# Patient Record
Sex: Male | Born: 1970 | Race: Black or African American | Hispanic: No | Marital: Single | Smoking: Current every day smoker
Health system: Southern US, Community
[De-identification: ages and names within clinical notes are randomized; demographics above are authoritative.]

## PROBLEM LIST (undated history)

## (undated) DIAGNOSIS — E119 Type 2 diabetes mellitus without complications: Secondary | ICD-10-CM

## (undated) DIAGNOSIS — I499 Cardiac arrhythmia, unspecified: Secondary | ICD-10-CM

## (undated) DIAGNOSIS — K219 Gastro-esophageal reflux disease without esophagitis: Secondary | ICD-10-CM

## (undated) DIAGNOSIS — E669 Obesity, unspecified: Secondary | ICD-10-CM

## (undated) DIAGNOSIS — G4730 Sleep apnea, unspecified: Secondary | ICD-10-CM

## (undated) DIAGNOSIS — I10 Essential (primary) hypertension: Secondary | ICD-10-CM

## (undated) DIAGNOSIS — R0602 Shortness of breath: Secondary | ICD-10-CM

## (undated) DIAGNOSIS — M1990 Unspecified osteoarthritis, unspecified site: Secondary | ICD-10-CM

## (undated) DIAGNOSIS — I2699 Other pulmonary embolism without acute cor pulmonale: Secondary | ICD-10-CM

## (undated) DIAGNOSIS — I4891 Unspecified atrial fibrillation: Secondary | ICD-10-CM

## (undated) HISTORY — DX: Gastro-esophageal reflux disease without esophagitis: K21.9

## (undated) HISTORY — PX: HEEL SPUR SURGERY: SHX665

## (undated) HISTORY — PX: HIP SURGERY: SHX245

## (undated) HISTORY — DX: Other pulmonary embolism without acute cor pulmonale: I26.99

---

## 1999-02-26 ENCOUNTER — Emergency Department (HOSPITAL_COMMUNITY): Admission: EM | Admit: 1999-02-26 | Discharge: 1999-02-26 | Payer: Self-pay | Admitting: Emergency Medicine

## 1999-02-26 ENCOUNTER — Encounter: Payer: Self-pay | Admitting: Emergency Medicine

## 1999-06-02 ENCOUNTER — Emergency Department (HOSPITAL_COMMUNITY): Admission: EM | Admit: 1999-06-02 | Discharge: 1999-06-02 | Payer: Self-pay | Admitting: Emergency Medicine

## 1999-11-28 ENCOUNTER — Emergency Department (HOSPITAL_COMMUNITY): Admission: EM | Admit: 1999-11-28 | Discharge: 1999-11-28 | Payer: Self-pay | Admitting: Emergency Medicine

## 2000-04-07 ENCOUNTER — Emergency Department (HOSPITAL_COMMUNITY): Admission: EM | Admit: 2000-04-07 | Discharge: 2000-04-08 | Payer: Self-pay | Admitting: Emergency Medicine

## 2000-04-08 ENCOUNTER — Encounter: Payer: Self-pay | Admitting: Emergency Medicine

## 2000-05-13 ENCOUNTER — Encounter: Admission: RE | Admit: 2000-05-13 | Discharge: 2000-05-13 | Payer: Self-pay | Admitting: Hematology and Oncology

## 2000-05-19 ENCOUNTER — Encounter: Admission: RE | Admit: 2000-05-19 | Discharge: 2000-05-19 | Payer: Self-pay | Admitting: Internal Medicine

## 2000-05-19 ENCOUNTER — Encounter: Payer: Self-pay | Admitting: Internal Medicine

## 2000-05-19 ENCOUNTER — Ambulatory Visit (HOSPITAL_COMMUNITY): Admission: RE | Admit: 2000-05-19 | Discharge: 2000-05-19 | Payer: Self-pay | Admitting: Internal Medicine

## 2000-05-23 ENCOUNTER — Ambulatory Visit (HOSPITAL_COMMUNITY): Admission: RE | Admit: 2000-05-23 | Discharge: 2000-05-23 | Payer: Self-pay

## 2000-05-26 ENCOUNTER — Encounter: Admission: RE | Admit: 2000-05-26 | Discharge: 2000-05-26 | Payer: Self-pay | Admitting: Internal Medicine

## 2000-05-27 ENCOUNTER — Encounter: Admission: RE | Admit: 2000-05-27 | Discharge: 2000-05-27 | Payer: Self-pay | Admitting: Hematology and Oncology

## 2000-06-26 ENCOUNTER — Ambulatory Visit (HOSPITAL_BASED_OUTPATIENT_CLINIC_OR_DEPARTMENT_OTHER): Admission: RE | Admit: 2000-06-26 | Discharge: 2000-06-26 | Payer: Self-pay | Admitting: *Deleted

## 2000-08-30 ENCOUNTER — Emergency Department (HOSPITAL_COMMUNITY): Admission: EM | Admit: 2000-08-30 | Discharge: 2000-08-30 | Payer: Self-pay | Admitting: Emergency Medicine

## 2000-09-23 ENCOUNTER — Emergency Department (HOSPITAL_COMMUNITY): Admission: EM | Admit: 2000-09-23 | Discharge: 2000-09-23 | Payer: Self-pay

## 2000-11-02 ENCOUNTER — Emergency Department (HOSPITAL_COMMUNITY): Admission: EM | Admit: 2000-11-02 | Discharge: 2000-11-02 | Payer: Self-pay | Admitting: Emergency Medicine

## 2002-01-05 ENCOUNTER — Emergency Department (HOSPITAL_COMMUNITY): Admission: EM | Admit: 2002-01-05 | Discharge: 2002-01-05 | Payer: Self-pay | Admitting: Emergency Medicine

## 2002-01-31 ENCOUNTER — Encounter: Payer: Self-pay | Admitting: Emergency Medicine

## 2002-01-31 ENCOUNTER — Emergency Department (HOSPITAL_COMMUNITY): Admission: EM | Admit: 2002-01-31 | Discharge: 2002-01-31 | Payer: Self-pay | Admitting: Emergency Medicine

## 2004-02-27 ENCOUNTER — Emergency Department (HOSPITAL_COMMUNITY): Admission: EM | Admit: 2004-02-27 | Discharge: 2004-02-27 | Payer: Self-pay | Admitting: Emergency Medicine

## 2004-08-09 ENCOUNTER — Emergency Department (HOSPITAL_COMMUNITY): Admission: EM | Admit: 2004-08-09 | Discharge: 2004-08-09 | Payer: Self-pay | Admitting: Emergency Medicine

## 2004-09-27 ENCOUNTER — Emergency Department (HOSPITAL_COMMUNITY): Admission: EM | Admit: 2004-09-27 | Discharge: 2004-09-27 | Payer: Self-pay | Admitting: Emergency Medicine

## 2005-11-06 ENCOUNTER — Emergency Department (HOSPITAL_COMMUNITY): Admission: EM | Admit: 2005-11-06 | Discharge: 2005-11-06 | Payer: Self-pay | Admitting: Emergency Medicine

## 2006-01-13 ENCOUNTER — Emergency Department (HOSPITAL_COMMUNITY): Admission: EM | Admit: 2006-01-13 | Discharge: 2006-01-13 | Payer: Self-pay | Admitting: Emergency Medicine

## 2006-01-25 ENCOUNTER — Emergency Department (HOSPITAL_COMMUNITY): Admission: EM | Admit: 2006-01-25 | Discharge: 2006-01-25 | Payer: Self-pay | Admitting: Emergency Medicine

## 2006-01-26 ENCOUNTER — Ambulatory Visit: Payer: Self-pay | Admitting: Pulmonary Disease

## 2006-01-26 ENCOUNTER — Inpatient Hospital Stay (HOSPITAL_COMMUNITY): Admission: EM | Admit: 2006-01-26 | Discharge: 2006-02-01 | Payer: Self-pay | Admitting: Emergency Medicine

## 2006-01-30 ENCOUNTER — Encounter: Payer: Self-pay | Admitting: Cardiology

## 2006-02-14 ENCOUNTER — Ambulatory Visit: Payer: Self-pay | Admitting: Internal Medicine

## 2006-02-18 ENCOUNTER — Ambulatory Visit: Payer: Self-pay | Admitting: Pulmonary Disease

## 2006-02-20 ENCOUNTER — Ambulatory Visit: Payer: Self-pay | Admitting: Cardiology

## 2006-02-25 ENCOUNTER — Ambulatory Visit: Payer: Self-pay | Admitting: Internal Medicine

## 2006-02-26 ENCOUNTER — Ambulatory Visit: Payer: Self-pay | Admitting: Cardiovascular Disease

## 2006-02-28 ENCOUNTER — Ambulatory Visit: Payer: Self-pay | Admitting: *Deleted

## 2006-02-28 ENCOUNTER — Emergency Department (HOSPITAL_COMMUNITY): Admission: EM | Admit: 2006-02-28 | Discharge: 2006-03-01 | Payer: Self-pay | Admitting: Emergency Medicine

## 2006-03-02 ENCOUNTER — Inpatient Hospital Stay (HOSPITAL_COMMUNITY): Admission: EM | Admit: 2006-03-02 | Discharge: 2006-03-13 | Payer: Self-pay | Admitting: Emergency Medicine

## 2006-03-04 ENCOUNTER — Encounter (INDEPENDENT_AMBULATORY_CARE_PROVIDER_SITE_OTHER): Payer: Self-pay | Admitting: Cardiovascular Disease

## 2006-03-06 ENCOUNTER — Ambulatory Visit: Payer: Self-pay | Admitting: Infectious Diseases

## 2006-03-14 ENCOUNTER — Ambulatory Visit: Payer: Self-pay | Admitting: Internal Medicine

## 2006-04-01 ENCOUNTER — Encounter: Admission: RE | Admit: 2006-04-01 | Discharge: 2006-04-01 | Payer: Self-pay | Admitting: Internal Medicine

## 2006-04-04 ENCOUNTER — Ambulatory Visit (HOSPITAL_BASED_OUTPATIENT_CLINIC_OR_DEPARTMENT_OTHER): Admission: RE | Admit: 2006-04-04 | Discharge: 2006-04-04 | Payer: Self-pay | Admitting: Pulmonary Disease

## 2006-04-10 ENCOUNTER — Ambulatory Visit: Payer: Self-pay | Admitting: Internal Medicine

## 2006-04-12 ENCOUNTER — Ambulatory Visit: Payer: Self-pay | Admitting: Pulmonary Disease

## 2006-05-20 ENCOUNTER — Ambulatory Visit: Payer: Self-pay | Admitting: Internal Medicine

## 2006-09-18 ENCOUNTER — Ambulatory Visit: Payer: Self-pay | Admitting: Internal Medicine

## 2006-09-18 DIAGNOSIS — G4730 Sleep apnea, unspecified: Secondary | ICD-10-CM | POA: Insufficient documentation

## 2006-09-18 DIAGNOSIS — M8700 Idiopathic aseptic necrosis of unspecified bone: Secondary | ICD-10-CM | POA: Insufficient documentation

## 2006-09-18 DIAGNOSIS — I10 Essential (primary) hypertension: Secondary | ICD-10-CM | POA: Insufficient documentation

## 2006-10-29 ENCOUNTER — Encounter (INDEPENDENT_AMBULATORY_CARE_PROVIDER_SITE_OTHER): Payer: Self-pay | Admitting: *Deleted

## 2006-11-13 ENCOUNTER — Encounter (INDEPENDENT_AMBULATORY_CARE_PROVIDER_SITE_OTHER): Payer: Self-pay | Admitting: Internal Medicine

## 2006-12-03 ENCOUNTER — Telehealth (INDEPENDENT_AMBULATORY_CARE_PROVIDER_SITE_OTHER): Payer: Self-pay | Admitting: *Deleted

## 2006-12-19 ENCOUNTER — Ambulatory Visit: Payer: Self-pay | Admitting: Internal Medicine

## 2006-12-19 ENCOUNTER — Ambulatory Visit (HOSPITAL_COMMUNITY): Admission: RE | Admit: 2006-12-19 | Discharge: 2006-12-19 | Payer: Self-pay | Admitting: Internal Medicine

## 2006-12-19 DIAGNOSIS — M79609 Pain in unspecified limb: Secondary | ICD-10-CM | POA: Insufficient documentation

## 2006-12-19 DIAGNOSIS — M25569 Pain in unspecified knee: Secondary | ICD-10-CM | POA: Insufficient documentation

## 2006-12-19 LAB — CONVERTED CEMR LAB
ALT: 20 units/L (ref 0–53)
AST: 15 units/L (ref 0–37)
Albumin: 4.3 g/dL (ref 3.5–5.2)
Alkaline Phosphatase: 77 units/L (ref 39–117)
BUN: 11 mg/dL (ref 6–23)
Basophils Absolute: 0 10*3/uL (ref 0.0–0.1)
Basophils Relative: 0 % (ref 0–1)
CO2: 23 meq/L (ref 19–32)
Calcium: 9.4 mg/dL (ref 8.4–10.5)
Chloride: 105 meq/L (ref 96–112)
Creatinine, Ser: 1.04 mg/dL (ref 0.40–1.50)
Eosinophils Absolute: 0.1 10*3/uL (ref 0.0–0.7)
Eosinophils Relative: 1 % (ref 0–5)
Glucose, Bld: 97 mg/dL (ref 70–99)
HCT: 44.2 % (ref 39.0–52.0)
Hemoglobin: 15 g/dL (ref 13.0–17.0)
Lymphocytes Relative: 44 % (ref 12–46)
Lymphs Abs: 3.1 10*3/uL (ref 0.7–3.3)
MCHC: 33.9 g/dL (ref 30.0–36.0)
MCV: 89.7 fL (ref 78.0–100.0)
Monocytes Absolute: 0.8 10*3/uL — ABNORMAL HIGH (ref 0.2–0.7)
Monocytes Relative: 11 % (ref 3–11)
Neutro Abs: 3.1 10*3/uL (ref 1.7–7.7)
Neutrophils Relative %: 44 % (ref 43–77)
Platelets: 293 10*3/uL (ref 150–400)
Potassium: 4 meq/L (ref 3.5–5.3)
RBC: 4.93 M/uL (ref 4.22–5.81)
RDW: 12.5 % (ref 11.5–14.0)
Sodium: 139 meq/L (ref 135–145)
Total Bilirubin: 0.3 mg/dL (ref 0.3–1.2)
Total Protein: 7.2 g/dL (ref 6.0–8.3)
WBC: 7.1 10*3/uL (ref 4.0–10.5)

## 2007-01-06 ENCOUNTER — Encounter (INDEPENDENT_AMBULATORY_CARE_PROVIDER_SITE_OTHER): Payer: Self-pay | Admitting: Internal Medicine

## 2007-01-15 ENCOUNTER — Encounter (INDEPENDENT_AMBULATORY_CARE_PROVIDER_SITE_OTHER): Payer: Self-pay | Admitting: Internal Medicine

## 2007-01-26 ENCOUNTER — Telehealth (INDEPENDENT_AMBULATORY_CARE_PROVIDER_SITE_OTHER): Payer: Self-pay | Admitting: Internal Medicine

## 2007-02-11 ENCOUNTER — Emergency Department (HOSPITAL_COMMUNITY): Admission: EM | Admit: 2007-02-11 | Discharge: 2007-02-11 | Payer: Self-pay | Admitting: Emergency Medicine

## 2007-02-26 ENCOUNTER — Ambulatory Visit: Payer: Self-pay | Admitting: Internal Medicine

## 2007-02-26 DIAGNOSIS — R109 Unspecified abdominal pain: Secondary | ICD-10-CM | POA: Insufficient documentation

## 2007-03-19 ENCOUNTER — Emergency Department (HOSPITAL_COMMUNITY): Admission: EM | Admit: 2007-03-19 | Discharge: 2007-03-19 | Payer: Self-pay | Admitting: Emergency Medicine

## 2007-03-23 ENCOUNTER — Emergency Department (HOSPITAL_COMMUNITY): Admission: EM | Admit: 2007-03-23 | Discharge: 2007-03-23 | Payer: Self-pay | Admitting: Emergency Medicine

## 2007-03-27 ENCOUNTER — Encounter (INDEPENDENT_AMBULATORY_CARE_PROVIDER_SITE_OTHER): Payer: Self-pay | Admitting: Internal Medicine

## 2007-04-10 ENCOUNTER — Encounter (INDEPENDENT_AMBULATORY_CARE_PROVIDER_SITE_OTHER): Payer: Self-pay | Admitting: Internal Medicine

## 2007-04-14 ENCOUNTER — Emergency Department (HOSPITAL_COMMUNITY): Admission: EM | Admit: 2007-04-14 | Discharge: 2007-04-14 | Payer: Self-pay | Admitting: Emergency Medicine

## 2007-04-30 ENCOUNTER — Ambulatory Visit: Payer: Self-pay | Admitting: Internal Medicine

## 2007-05-07 ENCOUNTER — Telehealth (INDEPENDENT_AMBULATORY_CARE_PROVIDER_SITE_OTHER): Payer: Self-pay | Admitting: Internal Medicine

## 2007-05-19 ENCOUNTER — Encounter: Admission: RE | Admit: 2007-05-19 | Discharge: 2007-07-15 | Payer: Self-pay | Admitting: Internal Medicine

## 2007-05-19 ENCOUNTER — Encounter (INDEPENDENT_AMBULATORY_CARE_PROVIDER_SITE_OTHER): Payer: Self-pay | Admitting: Internal Medicine

## 2007-05-26 ENCOUNTER — Telehealth (INDEPENDENT_AMBULATORY_CARE_PROVIDER_SITE_OTHER): Payer: Self-pay | Admitting: Internal Medicine

## 2007-05-29 ENCOUNTER — Telehealth (INDEPENDENT_AMBULATORY_CARE_PROVIDER_SITE_OTHER): Payer: Self-pay | Admitting: Internal Medicine

## 2007-06-19 ENCOUNTER — Emergency Department (HOSPITAL_COMMUNITY): Admission: EM | Admit: 2007-06-19 | Discharge: 2007-06-19 | Payer: Self-pay | Admitting: Family Medicine

## 2007-06-23 ENCOUNTER — Encounter (INDEPENDENT_AMBULATORY_CARE_PROVIDER_SITE_OTHER): Payer: Self-pay | Admitting: Internal Medicine

## 2007-06-30 ENCOUNTER — Ambulatory Visit: Payer: Self-pay | Admitting: Internal Medicine

## 2007-07-15 ENCOUNTER — Encounter (INDEPENDENT_AMBULATORY_CARE_PROVIDER_SITE_OTHER): Payer: Self-pay | Admitting: Internal Medicine

## 2007-09-08 ENCOUNTER — Emergency Department (HOSPITAL_COMMUNITY): Admission: EM | Admit: 2007-09-08 | Discharge: 2007-09-09 | Payer: Self-pay | Admitting: Emergency Medicine

## 2007-10-02 ENCOUNTER — Telehealth (INDEPENDENT_AMBULATORY_CARE_PROVIDER_SITE_OTHER): Payer: Self-pay | Admitting: Internal Medicine

## 2007-10-09 ENCOUNTER — Ambulatory Visit: Payer: Self-pay | Admitting: Internal Medicine

## 2007-10-09 DIAGNOSIS — G5620 Lesion of ulnar nerve, unspecified upper limb: Secondary | ICD-10-CM | POA: Insufficient documentation

## 2007-10-09 DIAGNOSIS — K029 Dental caries, unspecified: Secondary | ICD-10-CM | POA: Insufficient documentation

## 2007-10-09 DIAGNOSIS — G5600 Carpal tunnel syndrome, unspecified upper limb: Secondary | ICD-10-CM | POA: Insufficient documentation

## 2007-10-09 DIAGNOSIS — K219 Gastro-esophageal reflux disease without esophagitis: Secondary | ICD-10-CM | POA: Insufficient documentation

## 2007-12-01 ENCOUNTER — Ambulatory Visit: Payer: Self-pay | Admitting: Internal Medicine

## 2007-12-31 ENCOUNTER — Telehealth (INDEPENDENT_AMBULATORY_CARE_PROVIDER_SITE_OTHER): Payer: Self-pay | Admitting: Internal Medicine

## 2008-01-28 ENCOUNTER — Telehealth (INDEPENDENT_AMBULATORY_CARE_PROVIDER_SITE_OTHER): Payer: Self-pay | Admitting: Internal Medicine

## 2008-02-27 ENCOUNTER — Emergency Department (HOSPITAL_COMMUNITY): Admission: EM | Admit: 2008-02-27 | Discharge: 2008-02-27 | Payer: Self-pay | Admitting: Emergency Medicine

## 2008-03-31 ENCOUNTER — Ambulatory Visit: Payer: Self-pay | Admitting: Internal Medicine

## 2008-03-31 DIAGNOSIS — K089 Disorder of teeth and supporting structures, unspecified: Secondary | ICD-10-CM | POA: Insufficient documentation

## 2008-04-07 ENCOUNTER — Encounter (INDEPENDENT_AMBULATORY_CARE_PROVIDER_SITE_OTHER): Payer: Self-pay | Admitting: Internal Medicine

## 2008-04-28 ENCOUNTER — Telehealth (INDEPENDENT_AMBULATORY_CARE_PROVIDER_SITE_OTHER): Payer: Self-pay | Admitting: Internal Medicine

## 2008-07-21 ENCOUNTER — Encounter (INDEPENDENT_AMBULATORY_CARE_PROVIDER_SITE_OTHER): Payer: Self-pay | Admitting: Internal Medicine

## 2008-08-27 ENCOUNTER — Emergency Department (HOSPITAL_COMMUNITY): Admission: EM | Admit: 2008-08-27 | Discharge: 2008-08-27 | Payer: Self-pay | Admitting: Emergency Medicine

## 2008-09-09 ENCOUNTER — Emergency Department (HOSPITAL_COMMUNITY): Admission: EM | Admit: 2008-09-09 | Discharge: 2008-09-09 | Payer: Self-pay | Admitting: Family Medicine

## 2008-11-26 ENCOUNTER — Emergency Department (HOSPITAL_COMMUNITY): Admission: EM | Admit: 2008-11-26 | Discharge: 2008-11-26 | Payer: Self-pay | Admitting: Family Medicine

## 2009-01-30 ENCOUNTER — Telehealth (INDEPENDENT_AMBULATORY_CARE_PROVIDER_SITE_OTHER): Payer: Self-pay | Admitting: Internal Medicine

## 2009-01-31 ENCOUNTER — Encounter (INDEPENDENT_AMBULATORY_CARE_PROVIDER_SITE_OTHER): Payer: Self-pay | Admitting: Internal Medicine

## 2009-04-14 ENCOUNTER — Emergency Department (HOSPITAL_COMMUNITY): Admission: EM | Admit: 2009-04-14 | Discharge: 2009-04-14 | Payer: Self-pay | Admitting: Emergency Medicine

## 2009-07-11 ENCOUNTER — Emergency Department (HOSPITAL_COMMUNITY): Admission: EM | Admit: 2009-07-11 | Discharge: 2009-07-11 | Payer: Self-pay | Admitting: Family Medicine

## 2009-08-27 ENCOUNTER — Emergency Department (HOSPITAL_COMMUNITY): Admission: EM | Admit: 2009-08-27 | Discharge: 2009-08-28 | Payer: Self-pay | Admitting: Emergency Medicine

## 2009-11-20 ENCOUNTER — Encounter
Admission: RE | Admit: 2009-11-20 | Discharge: 2009-11-24 | Payer: Self-pay | Source: Home / Self Care | Attending: Physical Medicine & Rehabilitation | Admitting: Physical Medicine & Rehabilitation

## 2009-11-24 ENCOUNTER — Ambulatory Visit: Payer: Self-pay | Admitting: Physical Medicine & Rehabilitation

## 2010-01-23 ENCOUNTER — Encounter
Admission: RE | Admit: 2010-01-23 | Discharge: 2010-03-13 | Payer: Self-pay | Source: Home / Self Care | Attending: Physical Medicine & Rehabilitation | Admitting: Physical Medicine & Rehabilitation

## 2010-01-23 ENCOUNTER — Ambulatory Visit: Payer: Self-pay | Admitting: Physical Medicine & Rehabilitation

## 2010-03-13 NOTE — Progress Notes (Signed)
   Phone Note From Other Clinic   Caller: [facility] Summary of Call: Letter from above for Bariatric Surgery program--need to know if pt. still with [place] before fill out.  This was a question last week. Initial call taken by: Julieanne Manson MD,  January 30, 2009 11:50 AM  Follow-up for Phone Call         he is going to stay a patient with [place]. He has not been able to secure a primary provider but he wants to move forward with getting form completed to try and get his surgery. Pt needs by Thursday........ Follow-up by: Mikey College CMA,  January 31, 2009 11:14 AM  Additional Follow-up for Phone Call Additional follow up Details #1::        papers filled out Additional Follow-up by: Julieanne Manson MD,  January 31, 2009 2:15 PM

## 2010-03-13 NOTE — Assessment & Plan Note (Signed)
Summary: FU PER Janneth Krasner//AO   Vital Signs:  Patient Profile:   40 Years Old Male Weight:      338 pounds Temp:     98.7 degrees F oral Pulse rate:   80 / minute Pulse rhythm:   regular Resp:     18 per minute BP sitting:   122 / 72  (left arm) Cuff size:   large  Pt. in pain?   no  Vitals Entered By: [place] shannon, student, MA                  Chief Complaint:  pt wants to talk about seeing a denist....  History of Present Illness: 1.  Has teeth that need pulling--several cavities.  2.  Stiff all over, but groin especially--since cold and rainy.  Going to bed 3-4 pm in this cold rainy weather.  Is trying to go to pool for exercises, but doesn't like to come back out into cold air and so, often doesn't go.  3.  GERD:  No problems since starting Protonix.  HOB elevated.  Still smoking--states less, however.   4.  CTS:  Did not get cock up splints--not using computer as much and symptoms are not as frequent.   5.  Insomnia:  Was only able to get 12 of Rozerem filled last time at pharmacy.  Has not filled out ICP papers yet.  Having difficult time getting meds at [facility].    Prior Medications Reviewed Using: Medication Bottles  Current Allergies (reviewed today): No known allergies       Physical Exam  General:     Obese, NAD Mouth:     poor dentition.   Lungs:     Normal respiratory effort, chest expands symmetrically. Lungs are clear to auscultation, no crackles or wheezes. Heart:     Normal rate and regular rhythm. S1 and S2 normal without gallop, murmur, click, rub or other extra sounds.  Radial pulses normal and equal    Impression & Recommendations:  Problem # 1:  DENTAL CARIES (ICD-521.00) Slip for free dental clinic in November given.  Problem # 2:  CARPAL TUNNEL SYNDROME, BILATERAL (ICD-354.0) No longer a problem  Problem # 3:  GERD (ICD-530.81) controlled now. His updated medication list for this problem includes:    Protonix 40  Mg Pack (Pantoprazole sodium) .Marland Kitchen... 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 Start Naproxen 500 mg two times a day with food. Use Lortab on as needed basis  Problem # 5:  INSOMNIA (ICD-780.52) Discontinur Rozerem and start Amitriptyline 25 mg q hs--may also help with chronic pain The following medications were removed from the medication list:    Rozerem 8 Mg Tabs (Ramelteon) .Marland Kitchen... 1 tab by mouth daily   Problem # 6:  HYPERTENSION (ICD-401.9) Good control when compliant with meds. Encouraged continued weight loss--would like to see in 200 range ultimately. His updated medication list for this problem includes:    Metoprolol Tartrate 50 Mg Tabs (Metoprolol tartrate) .Marland Kitchen... Take 1 tablet by mouth once a day    Diltiazem Hcl Cr 240 Mg Cp24 (Diltiazem hcl) .Marland Kitchen... 1 tab by mouth daily   Complete Medication List: 1)  Metoprolol Tartrate 50 Mg Tabs (Metoprolol tartrate) .... Take 1 tablet by mouth once a day 2)  Colace 100 Mg Caps (Docusate sodium) .Marland Kitchen.. 1 by mouth once daily to two times a day 3)  Lortab 10 10-500 Mg  Tabs (Hydrocodone-acetaminophen) .Marland Kitchen.. 1 tab by mouth three times a day to qid as needed pain 4)  Diltiazem Hcl Cr 240 Mg Cp24 (Diltiazem hcl) .Marland Kitchen.. 1 tab by mouth daily 5)  Pool Exercises/swimming  .... Diagnosis of avascular necrosis of bilateral hips--needs to rehab with pool exercise. 6)  Protonix 40 Mg Pack (Pantoprazole sodium) .Marland Kitchen.. 1 cap by mouth daily 7)  Naproxen 500 Mg Tabs (Naproxen) .Marland Kitchen.. 1 tab by mouth two times a day with food 8)  Amitriptyline Hcl 25 Mg Tabs (Amitriptyline hcl) .Marland Kitchen.. 1 tab by mouth q hs   Patient Instructions: 1)  Call for follow up with Dr. Delrae Alfred in 2-3 months.   Prescriptions: LORTAB 10 10-500 MG  TABS (HYDROCODONE-ACETAMINOPHEN) 1 tab by mouth three times a day to qid as needed pain  #30 x 0   Entered and Authorized by:   Julieanne Manson MD   Signed by:   Julieanne Manson  MD on 12/01/2007   Method used:   Print then Give to Patient   RxID:   [id number] PROTONIX 40 MG PACK (PANTOPRAZOLE SODIUM) 1 cap by mouth daily  #30 x 6   Entered and Authorized by:   Julieanne Manson MD   Signed by:   Julieanne Manson MD on 12/01/2007   Method used:   Print then Give to Patient   RxID:   [id number] DILTIAZEM HCL CR 240 MG  CP24 (DILTIAZEM HCL) 1 tab by mouth daily  #30 x 6   Entered and Authorized by:   Julieanne Manson MD   Signed by:   Julieanne Manson MD on 12/01/2007   Method used:   Print then Give to Patient   RxID:   [id number] COLACE 100 MG  CAPS (DOCUSATE SODIUM) 1 by mouth once daily to two times a day  #60 x 6   Entered and Authorized by:   Julieanne Manson MD   Signed by:   Julieanne Manson MD on 12/01/2007   Method used:   Print then Give to Patient   RxID:   [id number] METOPROLOL TARTRATE 50 MG  TABS (METOPROLOL TARTRATE) Take 1 tablet by mouth once a day  #30 x 6   Entered and Authorized by:   Julieanne Manson MD   Signed by:   Julieanne Manson MD on 12/01/2007   Method used:   Print then Give to Patient   RxID:   [id number] AMITRIPTYLINE HCL 25 MG TABS (AMITRIPTYLINE HCL) 1 tab by mouth q hs  #30 x 6   Entered and Authorized by:   Julieanne Manson MD   Signed by:   Julieanne Manson MD on 12/01/2007   Method used:   Print then Give to Patient   RxID:   [id number] NAPROXEN 500 MG TABS (NAPROXEN) 1 tab by mouth two times a day with food  #60 x 6   Entered and Authorized by:   Julieanne Manson MD   Signed by:   Julieanne Manson MD on 12/01/2007   Method used:   Print then Give to Patient   RxID:   [id number]  ]

## 2010-03-13 NOTE — Assessment & Plan Note (Signed)
Summary: 2 month f/u and other medical issues /tmm    Vital Signs:  Patient Profile:   40 Years Old Male Weight:      336 pounds Temp:     97.8 degrees F oral Pulse (ortho):   82 / minute Pulse rhythm:   regular Resp:     14 per minute BP sitting:   132 / 90  (left arm)  Pt. in pain?   no  Vitals Entered By: [place] Webster(February 26, 2007 11:11 AM)              Is Patient Diabetic? No  Does patient need assistance? Ambulation Normal Comments patient states all his medications are differnet.     Chief Complaint:  wants to know if he can get an exam to check pelvic area and has not had his physical.  History of Present Illness: 1.  Pain in testicles at same time intermittently.  Can have in any position.  Cannot describe what it feels like other than like wearing tight underwear.  Changing positions doesn't help.  Lasts 20-30 minutes.  Generally every day.  Started 2 weeks.    First episode when sitting in a compact car for 20 minutes.  Urination does not cause a change.  No penile discharge.   2.  Bilateral leg pain to feet related to bilateral AVN of hips.  Has now had surgery on both hips--2 on right and last on left--01/20/08 through [facility].  Dr.  Willette Pa is who he sees in clinic.  Unclear who actually performed the surgery.  Their office is in control of pain medicine--switched to Lortab recently.  Pt.  has follow up in 3 months with them.   3.  Hypertension:  states he's taking meds regularly.  Current Allergies (reviewed today): No known allergies   Past Surgical History:    Cardiac Cath 03/12/06    R hip Athroscopy 2008      Physical Exam  Genitalia:     Testes bilaterally descended without nodularity, tenderness or masses. No scrotal masses or lesions. No penis lesions or urethral discharge.  Pt. actually points to bilateral inguinal area as his source of discomfort.  NT also along perineum.  No hernias    Impression & Recommendations:  Problem  # 1:  INGUINAL PAIN, BILATERAL (ICD-789.09) Discussed this is likely related to his ongoing problems with AVN and hip joint problems. If he develops new symptoms to let me know.  The following medications were removed from the medication list:    Vicodin 5-500 Mg Tabs (Hydrocodone-acetaminophen) .Marland Kitchen... 1-2 tab by mouth every 4 hrs as needed for pain  His updated medication list for this problem includes:    Lortab 10 10-500 Mg Tabs (Hydrocodone-acetaminophen) .Marland Kitchen... 1 tab by mouth three times a day to qid as needed pain   Problem # 2:  HYPERTENSION (ICD-401.9) Not controlled. Increase Diltiazem to 240 mg daily Follow up in 2 months. The following medications were removed from the medication list:    Diltiazem Hcl Cr 180 Mg Cp24 (Diltiazem hcl) .Marland Kitchen... Take 1 tablet by mouth once a day  His updated medication list for this problem includes:    Metoprolol Tartrate 50 Mg Tabs (Metoprolol tartrate) .Marland Kitchen... Take 1 tablet by mouth once a day    Diltiazem Hcl Cr 240 Mg Cp24 (Diltiazem hcl) .Marland Kitchen... 1 tab by mouth daily   Complete Medication List: 1)  Metoprolol Tartrate 50 Mg Tabs (Metoprolol tartrate) .... Take 1 tablet by mouth once  a day 2)  Colace 100 Mg Caps (Docusate sodium) .Marland Kitchen.. 1 by mouth once daily to two times a day 3)  Lortab 10 10-500 Mg Tabs (Hydrocodone-acetaminophen) .Marland Kitchen.. 1 tab by mouth three times a day to qid as needed pain 4)  Diltiazem Hcl Cr 240 Mg Cp24 (Diltiazem hcl) .Marland Kitchen.. 1 tab by mouth daily   Patient Instructions: 1)  Please schedule a follow-up appointment in 2 months for hypertension.    Prescriptions: DILTIAZEM HCL CR 240 MG  CP24 (DILTIAZEM HCL) 1 tab by mouth daily  #30 x 5   Entered and Authorized by:   Julieanne Manson MD   Signed by:   Julieanne Manson MD on 02/26/2007   Method used:   Print then Give to Patient   RxID:   [id number]  ]

## 2010-03-13 NOTE — Letter (Signed)
Summary: [facility]  [facility]   Imported By: Leodis Rains 03/13/2009 17:21:00  _____________________________________________________________________  External Attachment:    Type:   Image     Comment:   External Document

## 2010-03-13 NOTE — Letter (Signed)
Summary: *HSN Results Follow up  [facility]  [address] [place], [place] [address]   Phone: [phone]  Fax: [phone]      01/15/2007   [facility] [address] [place], [place]  [address]   Dear  Mr. Iktan Lampton,                            ____S.Drinkard,FNP   ____D. Gore,FNP       ____B. McPherson,MD   ____V. Rankins,MD    _X___E. Jenniferann Stuckert,MD    ____N. Daphine Deutscher, FNP  ____D. Reche Dixon, MD    ____K. Philipp Deputy, MD    ____Other     This letter is to inform you that your recent test(s):  _______Pap Smear    _______Lab Test     ___X____X-ray    ___X____ is within acceptable limits  _______ requires a medication change  _______ requires a follow-up lab visit  _______ requires a follow-up visit with your provider   Comments:  Suspect knee pain is probably due to changes in how you walk with your hip problems.  Let me know if not improving.  Also--I'm assuming your belly pain resolved--let me know if not.       _________________________________________________________ If you have any questions, please contact our office                     Sincerely,  Julieanne Manson MD [facility]

## 2010-03-13 NOTE — Assessment & Plan Note (Signed)
Summary: per nurse/ f/u hip pain//gk    Vital Signs:  Patient Profile:   40 Years Old Male Weight:      330 pounds Temp:     97 degrees F Pulse rate:   84 / minute Pulse rhythm:   regular Resp:     20 per minute BP sitting:   130 / 84  (left arm) Cuff size:   large  Pt. in pain?   yes    Location:   stomach and legs    Intensity:   6  Vitals Entered By: Vesta Mixer CMA (December 19, 2006 12:34 PM)              Is Patient Diabetic? No Comments uses cane Tiazac, Oxycodone, Hydrocodone, ASA, Sleeping med,  metoprolol     Chief Complaint:  f/u hip pain and has had surgery x 2 on the right now planning on surgery for the left.  History of Present Illness: 1.  Pt.  with AVN of both hips.  Has had 2 different surgeries on right and now plan to do surgery on left.  Having pain in bottoms of feet past 2 weeks--just when on feet mainly.  Aching type pain.  Right knee has also hurts past 2 weeks.  Knee and feet hurt worse when cold out.  Very difficult with history as having stomach pain.  2.  Stomach pain started 4-5 days ago.  Aches around umbilicus.  No diarrhea or constipation, nausea or vomiting.  Decreased appetite.  Drinking fluids.  Waking with soaking sweats at night--otherwise unclear if having fever.  3.  Htn:  Taking Metoprolol, but ran out of Tiazac about 5 days ago.  Current Allergies: No known allergies     Risk Factors:  Tobacco use:  current    Cigars:  Yes -- 30 per week    Physical Exam  Lungs:     Normal respiratory effort, chest expands symmetrically. Lungs are clear to auscultation, no crackles or wheezes. Heart:     Normal rate and regular rhythm. S1 and S2 normal without gallop, murmur, click, rub or other extra sounds. Abdomen:     soft, normal bowel sounds, no distention, no masses, no guarding, no rebound tenderness, no hepatomegaly, and no splenomegaly.   Morbidly obese.  Tender LUQ and LLQ --mild to moderate  Msk:     Possibly tender on  plantar surface of heel--but pt.  could not focus to adequately evaluate.    Impression & Recommendations:  Problem # 1:  ABDOMINAL PAIN, UNSPECIFIED SITE (ICD-789.00) After obtaining xray, to try Fleet's enema. Await STAT labs. Appeared to be much more comfortable while getting blood drawn. Orders: Diagnostic X-Ray/Fluoroscopy (Diagnostic X-Ray/Flu) T-Comprehensive Metabolic Panel ([id number]) T-CBC w/Diff ([id number])   Problem # 2:  KNEE PAIN, RIGHT (ICD-719.46) Xray of right knee His updated medication list for this problem includes:    Vicodin 5-500 Mg Tabs (Hydrocodone-acetaminophen) .Marland Kitchen... 1-2 tab by mouth every 4 hrs as needed for pain   Problem # 3:  HYPERTENSION (ICD-401.9) Refilled both His updated medication list for this problem includes:    Metoprolol Tartrate 50 Mg Tabs (Metoprolol tartrate) .Marland Kitchen... Take 1 tablet by mouth once a day    Diltiazem Hcl Cr 180 Mg Cp24 (Diltiazem hcl) .Marland Kitchen... Take 1 tablet by mouth once a day   Problem # 4:  FOOT PAIN, BILATERAL (ICD-729.5) Unclear etiology--could not get pt. to focus while examining. Will reevaluate when more comfortable with regards to abdomen  Complete  Medication List: 1)  Vicodin 5-500 Mg Tabs (Hydrocodone-acetaminophen) .Marland Kitchen.. 1-2 tab by mouth every 4 hrs as needed for pain 2)  Metoprolol Tartrate 50 Mg Tabs (Metoprolol tartrate) .... Take 1 tablet by mouth once a day 3)  Diltiazem Hcl Cr 180 Mg Cp24 (Diltiazem hcl) .... Take 1 tablet by mouth once a day 4)  Colace 100 Mg Caps (Docusate sodium) .Marland Kitchen.. 1 by mouth once daily to two times a day   Patient Instructions: 1)  Get abdominal xray today. 2)  Fleet's enema today--if good result and pain improved, start Colace 100 mg two times a day     Prescriptions: COLACE 100 MG  CAPS (DOCUSATE SODIUM) 1 by mouth once daily to two times a day  #60 x 6   Entered and Authorized by:   Julieanne Manson MD   Signed by:   Julieanne Manson MD on 12/19/2006   Method used:    Print then Give to Patient   RxID:   [id number] DILTIAZEM HCL CR 180 MG  CP24 (DILTIAZEM HCL) Take 1 tablet by mouth once a day  #30 x 6   Entered and Authorized by:   Julieanne Manson MD   Signed by:   Julieanne Manson MD on 12/19/2006   Method used:   Print then Give to Patient   RxID:   [id number] METOPROLOL TARTRATE 50 MG  TABS (METOPROLOL TARTRATE) Take 1 tablet by mouth once a day  #30 x 6   Entered and Authorized by:   Julieanne Manson MD   Signed by:   Julieanne Manson MD on 12/19/2006   Method used:   Print then Give to Patient   RxID:   [id number]  ]

## 2010-03-13 NOTE — Progress Notes (Signed)
Summary: Lortab Refill   Phone Note Call from Patient Call back at Home Phone [phone]   Caller: Patient Call For: [phone] Summary of Call: the pt needs more refill from his lortab medication. [facility] ([address]) [phone]. Dr. Delrae Alfred Initial call taken by: Manon Hilding,  December 31, 2007 10:03 AM  Follow-up for Phone Call        Pt last got #30 on 12/01/07. Follow-up by: Vesta Mixer CMA,  December 31, 2007 10:23 AM  Additional Follow-up for Phone Call Additional follow up Details #1::        Note beside medication states that pt recieved this medication from [facility].  However upon review of medication history Saphronia Ozdemir has been prescribing pain meds in limited amount for at least the past 6 months for AVN. Will print to fax to pt's pharmacy Additional Follow-up by: Lehman Prom FNP,  January 01, 2008 8:03 AM    Additional Follow-up for Phone Call Additional follow up Details #2::    pt informed.  Rx faxed to pharmacy.  Phone note complete  Follow-up by: Levon Hedger,  January 01, 2008 8:51 AM    Prescriptions: LORTAB 10 10-500 MG  TABS (HYDROCODONE-ACETAMINOPHEN) 1 tab by mouth three times a day to qid as needed pain  #30 x 0   Entered and Authorized by:   Lehman Prom FNP   Signed by:   Lehman Prom FNP on 01/01/2008   Method used:   Printed then faxed to ...         RxID:   [id number]

## 2010-03-13 NOTE — Miscellaneous (Signed)
Summary: Rehab Report/INITIAL SUMMARY  Rehab Report/INITIAL SUMMARY   Imported By: Arta Bruce 06/03/2007 10:45:46  _____________________________________________________________________  External Attachment:    Type:   Image     Comment:   External Document

## 2010-03-13 NOTE — Miscellaneous (Signed)
Summary: VIP  Patient: Phillip Frye Note: All result statuses are Final unless otherwise noted.  Tests: (1) VIP (Medications)   LLIMPORTMEDS              "Result Below..."       RESULT: TIAZAC CP24 180 MG*TAKE ONE CAPSULE BY MOUTH DAILY*05/21/2006*Last Refill: [facility]*******   LLIMPORTMEDS              "Result Below..."       RESULT: PROTONIX TBEC 40 MG*TAKE ONE (1) TABLET BY MOUTH EVERY DAY*03/17/2006*Last Refill: [email or web address]*[id number]*******   LLIMPORTMEDS              "Result Below..."       RESULT: NAPROXEN TABS 500 MG*TAKE ONE TABLET TWICE DAILY WITH MEALS*03/17/2006*Last Refill: SEGBTDV*[id number]*******   LLIMPORTMEDS              "Result Below..."       RESULT: METOPROLOL TARTRATE TABS 50 MG*TAKE ONE (1) TABLET BY MOUTH TWO (2)  TIMES DAILY*05/21/2006*Last Refill: VPXTGGY*[id number]*******   LLIMPORTMEDS              "Result Below..."       RESULT: BISOPROLOL FUMARATE TABS 5 MG*TAKE ONE (1) TABLET EACH DAY*02/26/2006*Last Refill: IOEVOJJ*[id number]*******   LLIMPORTMEDS              "Result Below..."       RESULT: ASPIRIN EC TBEC 81 MG*TAKE ONE (1) TABLET BY MOUTH EVERY DAY*12/09/2006*Last Refill: HWEXHBZ*[id number]*******   LLIMPORTALLS              ***  Note: An exclamation mark (!) indicates a result that was not dispersed into the flowsheet. Document Creation Date: 12/11/2006 3:05 PM _______________________________________________________________________  (1) Order result status: Final Collection or observation date-time: 10/29/2006 Requested date-time: 10/29/2006 Receipt date-time:  Reported date-time: 10/29/2006 Referring Physician:   Ordering Physician:   Specimen Source:  Source: [place] Order Number:  Lab site:

## 2010-03-13 NOTE — Progress Notes (Signed)
Summary: office visit  Phone Note Call from Patient Call back at Home Phone [phone]   Caller: Patient Call For: [phone] Summary of Call: The patient states that he has to come for a follow appoitment (hip pain). Dr Delrae Alfred  Initial call taken by: Manon Hilding,  December 03, 2006 10:57 AM  Follow-up for Phone Call        OK to schedule appointment for patient at next available time slot  Follow-up by: Vesta Mixer CMA,  December 08, 2006 10:33 AM  Additional Follow-up for Phone Call Additional follow up Details #1::        Appt Scheduled Additional Follow-up by: Manon Hilding,  December 09, 2006 11:56 AM

## 2010-04-28 LAB — POCT I-STAT, CHEM 8
BUN: 11 mg/dL (ref 6–23)
Calcium, Ion: 1 mmol/L — ABNORMAL LOW (ref 1.12–1.32)
Chloride: 100 mEq/L (ref 96–112)
Creatinine, Ser: 1.3 mg/dL (ref 0.4–1.5)
Glucose, Bld: 111 mg/dL — ABNORMAL HIGH (ref 70–99)
HCT: 46 % (ref 39.0–52.0)
Hemoglobin: 15.6 g/dL (ref 13.0–17.0)
Potassium: 3.5 mEq/L (ref 3.5–5.1)
Sodium: 133 mEq/L — ABNORMAL LOW (ref 135–145)
TCO2: 25 mmol/L (ref 0–100)

## 2010-04-28 LAB — DIFFERENTIAL
Basophils Absolute: 0 10*3/uL (ref 0.0–0.1)
Basophils Relative: 0 % (ref 0–1)
Eosinophils Absolute: 0 10*3/uL (ref 0.0–0.7)
Eosinophils Relative: 0 % (ref 0–5)
Lymphocytes Relative: 45 % (ref 12–46)
Lymphs Abs: 1.6 10*3/uL (ref 0.7–4.0)
Monocytes Absolute: 0.6 10*3/uL (ref 0.1–1.0)
Monocytes Relative: 16 % — ABNORMAL HIGH (ref 3–12)
Neutro Abs: 1.3 10*3/uL — ABNORMAL LOW (ref 1.7–7.7)
Neutrophils Relative %: 38 % — ABNORMAL LOW (ref 43–77)

## 2010-04-28 LAB — CBC
HCT: 42.6 % (ref 39.0–52.0)
Hemoglobin: 14.5 g/dL (ref 13.0–17.0)
MCH: 30.4 pg (ref 26.0–34.0)
MCHC: 34 g/dL (ref 30.0–36.0)
MCV: 89.6 fL (ref 78.0–100.0)
Platelets: 193 10*3/uL (ref 150–400)
RBC: 4.76 MIL/uL (ref 4.22–5.81)
RDW: 12.3 % (ref 11.5–15.5)
WBC: 3.6 10*3/uL — ABNORMAL LOW (ref 4.0–10.5)

## 2010-04-28 LAB — GLUCOSE, CAPILLARY: Glucose-Capillary: 118 mg/dL — ABNORMAL HIGH (ref 70–99)

## 2010-04-30 LAB — POCT RAPID STREP A (OFFICE): Streptococcus, Group A Screen (Direct): NEGATIVE

## 2010-05-22 ENCOUNTER — Ambulatory Visit: Payer: Medicare Other | Admitting: Physical Medicine & Rehabilitation

## 2010-05-22 ENCOUNTER — Encounter: Payer: Medicare Other | Attending: Physical Medicine & Rehabilitation

## 2010-05-31 ENCOUNTER — Emergency Department (HOSPITAL_COMMUNITY)
Admission: EM | Admit: 2010-05-31 | Discharge: 2010-05-31 | Disposition: A | Discharge status: Home / Self Care | Payer: Medicare Other | Attending: Emergency Medicine | Admitting: Emergency Medicine

## 2010-05-31 DIAGNOSIS — G4730 Sleep apnea, unspecified: Secondary | ICD-10-CM | POA: Insufficient documentation

## 2010-05-31 DIAGNOSIS — K029 Dental caries, unspecified: Secondary | ICD-10-CM | POA: Insufficient documentation

## 2010-05-31 DIAGNOSIS — I10 Essential (primary) hypertension: Secondary | ICD-10-CM | POA: Insufficient documentation

## 2010-05-31 DIAGNOSIS — K089 Disorder of teeth and supporting structures, unspecified: Secondary | ICD-10-CM | POA: Insufficient documentation

## 2010-06-29 NOTE — Discharge Summary (Signed)
Frye, Phillip                ACCOUNT NO.:  [id number]   MEDICAL RECORD NO.:  [id number]          PATIENT TYPE:  INP   LOCATION:  3708                         FACILITY:  [facility]   PHYSICIAN:  Isidor Holts, M.D.  DATE OF BIRTH:  July 13, 1970   DATE OF ADMISSION:  03/02/2006  DATE OF DISCHARGE:                               DISCHARGE SUMMARY   DISCHARGE DIAGNOSIS:  1. Pyrexia of unknown origin.  2. Atypical chest pain.  3. Possible cardiomyopathy.  4. Obstructive sleep apnea syndrome, on CPAP.  5. Morbid obesity.  6. 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  7. Gastroesophageal reflux disease.  8. Ex-smoker, quit recently.  9. History of bronchiolitis obliterans with organizing pneumonia      (BOOP), December 2007.   DISCHARGE MEDICATIONS:  To be listed in addendum at the time of actual  discharge, by discharging MD.   PROCEDURES:  1. Two view chest x-ray dated March 01, 2006, showed mild bronchitic      changes, low lung volumes.  2. Portable chest x-ray dated March 02, 2006, this showed no acute      disease.  3. Abdominal CT scan dated March 02, 2006, this showed no acute      finding in the abdomen to explain the patient's symptoms, no acute      finding in the pelvis, there was right hip avascular necrosis noted      and, likely, early avascular necrosis of the left hip.  4. Two view chest x-ray dated March 04, 2006, showed bronchitic      changes with atelectasis.  5. Chest CT angiogram dated March 05, 2006, showed no evidence of      pulmonary embolus, there was minimal atelectasis, also mildly      prominent upper abdominal lymph nodes new since prior study.  6. Stress Myoview dated March 06, 2006, showed an area of focal      reversibility suspected in the mid segment of the anterior septum,      also suggestion of subtle hypokinesis involving the anterior aspect      of the septum, ejection fraction  estimated at 57%.  7. Fluoroscopic guided lumbar puncture dated March 07, 2006, this      was a successful diagnostic lumbar puncture, opening pressure was      32 cmH2O which was elevated.  Findings were WBCs 2, RBCs 405,      neutrophils 0, lymphocytes few, glucose 62, protein 29, gram stain      showed no organisms.  Culture showed no organisms.  8. Transesophageal echocardiogram dated March 04, 2006, this showed      overall normal left ventricular function, LVEF was estimated to be      60%, there were no left ventricular regional wall motion      abnormalities.  Left ventricular wall thickness was mildly      increased.  The left atrial appendage function was normal.  There      was no left atrial appendage thrombus, pulmonary veins were grossly  normal, no intra-cardiac shunt was effected by contrast study with      agitated saline.   CONSULTATIONS:  1. Ricki Rodriguez, M.D., cardiology  2. Rockey Situ. Roxan Hockey, M.D., infectious disease  3. Lacretia Leigh. Ninetta Lights, M.D., infectious disease   ADMISSION HISTORY:  As in H&P notes of March 02, 2006.  However, in  brief, this is a 40 year old male, with known history of obstructive  sleep apnea syndrome on nocturnal CPAP, bronchiolitis obliterans with  organizing pneumonia, diagnosed during admission December 16 through  January 30, 2006, on follow up with Dr. Marcelyn Bruins, pulmonologist,  status post resolution on chest CT scan dated February 20, 2006, recently  quit smoking in December 2007, history of GERD, who presents with fever  and chills for a few days, headache, chest pain, shortness of breath,  and abdominal pain for the past two weeks, although, according to  patient, since discharge from [facility] on January 30, 2006,  he has not felt quite well.  As a matter of fact, he had seen a  cardiologist, i.e., Dr. Excell Seltzer, from [facility], and was  scheduled to have a cardiac catheterization on March 12, 2006.  Because of the above symptoms, he presented to the emergency room  where  he was found to be pyrexial with a temperature of 102.  He was admitted  for further evaluation, investigation and management.   CLINICAL COURSE:  1. Pyrexia of unknown origin.  The patient has been troubled by a      vague illness consisting of feeling nonspecifically unwell, since      discharge from [facility] on February 01, 2006.  During      the course of this hospitalization, he has received extensive      workup including serial chest x-rays, transesophageal      echocardiogram, abdominal/pelvic CT scan, at least four blood      cultures, lumbar puncture, HIV testing, ESR, all which have proved      negative at the time of this dictation on March 11, 2006.      Predictably, we have requested consultation from the infectious      disease specialist and this was kindly provided by Drs. Ward      Roxan Hockey and Johny Sax, who have guided appropriate      investigation of this patient's condition.  As of March 10, 2006,      all medications had been discontinued per recommendation of      infectious disease specialist.  Of note, since hospitalization on      March 02, 2006, the patient has been running intermittent fevers      of the order of about 101 to 102 to 103.  For the first time on      March 11, 2006, the patient has not had any documented pyrexia,      raising the suspicion of possible drug fever.  At the time of this      dictation, he is still under observation.   1. Obstructive sleep apnea syndrome.  The patient continues on      nocturnal CPAP.   1. Atypical chest pain.  At the time of initial presentation, the      patient had complained of atypical sounding chest pain and on      detailed questioning, it appears that he has already seen a      cardiologist, Dr. Excell Seltzer, from [facility], and was  scheduled to have a cardiac catheterization for February 18, 2006.      Per patient and family's request, Dr. Algie Coffer, was called on      cardiology consultation.  He performed a stress Myoview, for      details of findings, refer to procedure list above.  He performed      transesophageal echocardiogram and has the patient scheduled for      cardiac catheterization on March 12, 2006, on suspicion of      possible cardiomyopathy.   1. Avascular necrosis.  The patient continues to complain of bilateral      hip pain.  Abdominal/pelvic CT scan, which was done as part of the      patient's initial evaluation, demonstrated right hip avascular      necrosis and changes consistent with possible early avascular      necrosis in the left hip.  It is likely that this is secondary to      steroid therapy which was utilized during the patient's      hospitalization in December 2007, for BOOP.   1. GERD.  The patient was managed for this, with proton pump inhibitor      treatment.   DISPOSITION:  This will be elucidated in detail in an addendum at the  time of actual discharge, by discharging MD, however, it is anticipated  that provided the patient remains afebrile in the next 48-72 hours, it  may be possible  to discharge him with arrangements put in place for appropriate  outpatient follow up.  Of course, as mentioned above, the patient is to  undergo cardiac catheterization by Dr. Algie Coffer on March 12, 2006,  further management of a possible heart condition will depend on  findings.      Isidor Holts, M.D.  Electronically Signed     CO/MEDQ  D:  03/11/2006  T:  03/11/2006  Job:  [id number]   cc:   Marcene Duos, M.D.  Rockey Situ. Flavia Shipper., M.D.  Lacretia Leigh. Ninetta Lights, M.D.  Ricki Rodriguez, M.D.

## 2010-06-29 NOTE — Discharge Summary (Signed)
[name], Phillip Frye                ACCOUNT NO.:  [id number]   MEDICAL RECORD NO.:  [id number]          PATIENT TYPE:  INP   LOCATION:  3708                         FACILITY:  [facility]   PHYSICIAN:  Madaline Savage, MD        DATE OF BIRTH:  02-11-1971   DATE OF ADMISSION:  03/02/2006  DATE OF DISCHARGE:  03/13/2006                               DISCHARGE SUMMARY   ADDENDUM TO DISCHARGE SUMMARY:  This is an addendum to discharge summary  dictated by Dr. Ricke Hey on March 11, 2006.  This discharge summary  covers the date of March 12, 2006 and March 13, 2006.  For a  complete list of discharge diagnoses, see the discharge summary dictated  by Dr. Ricke Hey.   PROBLEM LIST:  1. Pyrexia of unknown origin.  His fever of unknown origin has been      worked up completely as explained in the last interim discharge      summary and his fever was about 101-102 until we stopped his      medications.  We stopped all his antivirals and antibiotics and his      fever came down to less than 99 and it has stayed below 99 for the      last 48 hours, so he will be most likely having had a drug fever.      At this point of time, infectious disease doctor has signed off so      he will be stable for discharge.  I have told him that if he      continues to run high fevers, to call his doctor or come back to      the hospital.  2. Atypical chest pain.  He had worked up in the hospital for this      atypical chest pain.  He had a cardiac catheterization done on      March 12, 2006 by Dr. Algie Coffer which showed normal coronaries.  We      will be treating him medically with an aspirin and a beta blocker.   DISCHARGE MEDICATIONS:  1. Aspirin 81 mg once daily.  2. Cardizem CD 180 mg once daily.  3. Lopressor 50 mg twice daily.   DISPOSITION:  He will now be discharged home in a stable condition.  He  will continue using CPAP at home.   FOLLOWUP:  He will follow up with his family doctor at [facility]  and he  states he has an appointment to see the doctor at [facility] today.  He  will also try to see an orthopedic doctor for his hip problems.      Madaline Savage, MD  Electronically Signed     PKN/MEDQ  D:  03/13/2006  T:  03/13/2006  Job:  [id number]

## 2010-06-29 NOTE — H&P (Signed)
NAME:  Phillip Frye, Phillip Frye NO.:  [id number]   MEDICAL RECORD NO.:  [id number]          PATIENT TYPE:  EMS   LOCATION:  ED                           FACILITY:  [facility]   PHYSICIAN:  Andres Shad. Rudean Curt, MD     DATE OF BIRTH:  1970-05-30   DATE OF ADMISSION:  01/26/2006  DATE OF DISCHARGE:                              HISTORY & PHYSICAL   CHIEF COMPLAINT:  Shortness of breath.   HISTORY OF PRESENT ILLNESS:  Phillip Frye is a 40 year old male who  presented to the emergency department this morning with shortness of  breath. He was in his usual state of health until approximately 2 weeks  ago, when he experienced the onset of headache and congestion. He was  seen in the emergency department on January 13, 2006, where he was found  to have a blood pressure of 141/90. A CT scan of the head was performed  that did not reveal any abnormalities. He was sent home at that time. In  the interval 2 weeks, he experienced the onset of nausea and vomiting  approximately 3 days ago , along with some constitutional symptoms.  Yesterday, on January 25, 2006, he came to the emergency department,  again complaining of headache and this time with fever. He was found to  have a temperature of 101.8 at the time and a pulse of 114. His blood  pressure was normal. An x-ray was performed that was suggestive of  bilateral atypical pneumonia. He was given morphine and Zofran in the  emergency department and discharged on Zithromax. However, this morning  he returned to the emergency department with worsening cough and  shortness of breath.   REVIEW OF SYSTEMS:  Positive for vomiting, loss of appetite, fever,  cough, and dyspnea. It is negative for chest pain, rash, or leg  swelling.   PAST MEDICAL HISTORY:  Sleep apnea. The patient does not currently have  a CPAP or Bi-PAP machine, although he has in the past. Otherwise,  negative. Acid reflux.   SOCIAL HISTORY:  The patient lives with his sister  and her 72 and 76 year  old children. They are all in good health. Positive for occasional  alcohol. Positive for smoking. Positive for cannabis use.   MEDICATIONS:  The patient takes no medications. He has used Mucinex for  this illness.   ALLERGIES:  NO KNOWN DRUG ALLERGIES.   PHYSICAL EXAMINATION:  VITAL SIGNS:  Temperature 100.5. Pulse 127. Blood  pressure 143/95. Oxygen saturation 81% on room air, corrected to 97% on  face mask.  GENERAL:  The patient was alert, slightly breathless, in no acute  distress.  HEENT:  Tympanic membranes clear. Oropharynx was red. Moist mucous  membranes. No jugular venous distention. No lymphadenopathy.  CHEST:  Shallow breath sounds. Air movement diminished throughout the  lung fields. There were end-expiratory crackles in all lung fields.  There is no asymmetry in the pulmonary examination.  CARDIOVASCULAR:  Regular. Normal S1 and S2. No murmur, rub, or gallop.  ABDOMEN:  Obese. Normal bowel sounds. Soft, nontender, slightly  distended. No organomegaly.  EXTREMITIES:  No peripheral edema.  SKIN:  Dry.   LABORATORY DATA:  These were drawn last night, January 25, 2006 at 5:39  p.m. White blood cell count 6.8. Hemoglobin and hematocrit 14.5 and  42.7. Platelet count 412,000. Differential 59% neutrophils, 29%  lymphocytes, 10% monocytes, 2% eosinophils. Electrolytes were within  normal limits. Glucose slightly elevated at 112, creatinine 1.2, BUN 8.  Liver function studies were all within normal limits except for albumin,  which was slightly low at 3.2. Lipase was normal at 28. Urinalysis was  positive only for trace ketones.   Chest x-ray also performed yesterday shows slight hyper-expansion. There  is an interstitial edema pattern that could be consistent with a typical  pneumonia or congestive heart failure.   ASSESSMENT:  This is a 40 year old male with history, history and  physical, and radiographic findings consistent with typical  pneumonia.  This may be an atypical bacterial pathogen such as Mycoplasma Chlamydia  or Legionella, or could be a viral pathogen such as Influenza. Because  the patient does not have prominent upper respiratory symptoms, I think  it is more likely that this is an atypical bacterial pathogen. The  patient has a history of sleep apnea, which is not currently being  treated. However, he would be more likely to have peripheral edema,  rather than pulmonary edema, if that were the cause of congestive heart  failure.   PLAN:  1. PNEUMONIA:  The patient will be admitted to the hospital. He will      receive intravenous Ceftriaxone and azithromycin. He has not had a      blood culture but he has already received antibiotics in the      emergency department. I will order blood cultures.  2. HYPOXEMIA:  The patient will be given oxygen as needed to maintain      his oxygen saturation above 92%. I will also give him prednisone 30      mg b.i.d. and albuterol nebulizer treatments because he has end-      expiratory crackles and wheezes.  3. INTERSTITIAL PATTERN ON CHEST X-RAY:  Although I do not think that      this patient has congestive heart failure, I will check a serum BNP      level and if that is elevated, I will proceed with treatment and      diagnosis for congestive heart failure. However, I think this      diagnosis is unlikely and I will not treat him for this diagnosis      at this time.  4. SLEEP APNEA:  I will order Bi-PAP 12/5 at night for the pateint and      attempt to arrange outpatient Bi-PAP if possible. However, the      patient is currently uninsured and he may require an outpatient      sleep study for documentation of need.      Andres Shad. Rudean Curt, MD  Electronically Signed     PML/MEDQ  D:  01/26/2006  T:  01/26/2006  Job:  [id number]

## 2010-06-29 NOTE — Discharge Summary (Signed)
Phillip Frye, Phillip Frye                ACCOUNT NO.:  [id number]   MEDICAL RECORD NO.:  [id number]          PATIENT TYPE:  INP   LOCATION:  1607                         FACILITY:  [facility]   PHYSICIAN:  Theone Stanley, MD   DATE OF BIRTH:  [birth year]   DATE OF ADMISSION:  01/26/2006  DATE OF DISCHARGE:                               DISCHARGE SUMMARY   ADMISSION DIAGNOSES:  1. Shortness of breath/hypoxemia/pneumonia.  2. Obstructive sleep apnea.   DISCHARGE DIAGNOSES:  1. Shortness of breath/hypoxemia/pneumonia.  2. Obstructive sleep apnea.  3. Bronchitis obliterans with organized pneumonia, (BOOP).   CONSULTATIONS:  Pulmonology.   PROCEDURES/DIAGNOSTIC TESTING:  Phillip Frye had a CT scan angiogram on  January 30, 2006 which was negative for PE.  Both lungs were clear.  No  suspicious masses or nodules.  The patient had an echocardiogram  performed on January 30, 2006 with impression of left ventricular size  is upper limits of normal.  There is no left ventricular wall motion  abnormality.  Left ventricular wall thickness is mildly increased.  Left  atrium is mildly dilated.  Aortic valve was normal.  Aortic root was  normal.  Right ventricle was normal size. Mild tricuspid valvular  regurgitation.   HOSPITAL COURSE:  Phillip Frye is a very pleasant 40 year old African-  American gentleman who presented to the hospital on January 26, 2006  with complaints of shortness of breath.  He was in his usual state of  health approximately two weeks prior to his admission. He did complain  of some headache and some congestion.  He presented to the emergency  room on January 13, 2006 with blood pressure 141/90.  A CT scan of the  head was performed which did not show any abnormalities.  He was sent  home at that time.  Since that initial emergency room visit he  experienced some nausea and vomiting three days prior to his admission  and one day prior to his admission he was found to have a  temperature of  101.8 with a pulse of 114.  His blood pressure was normal and x-rays  showed he had bilateral atypical pneumonia.  At that point in time the  patient was started on Zithromax.  He was discharged from the emergency  room and returned with complaints of increasing shortness of breath.  The patient was admitted for community acquired pneumonia and was placed  on ceftriaxone and azithromycin.  He was noted to be hypoxemic and had  interstitial pattern on chest x-ray.  There was concern because of the  atypical nature of the presentation with bilateral and his age.  PCP was  ruled out.  The patient was also placed on BiPAP while in the hospital  here because of the hypoxemia. He was also started on a steroid because  of the presentation.  Pulmonology was consulted on January 30, 2006.  Because also of presentation and the hypoxemia, a CT scan angio was  performed which was negative.  An echocardiogram was performed which did  not show any evidence of congestive heart failure or structural  abnormalities.  Pulmonary consulted on January 30, 2006 and review of  the CT scan by the pulmonologist was most suggestive of post viral  BOOP/bronchiolitis.  The patient was placed on intravenous steroids.  By  February 01, 2006 he had much improved.  He did not need as much O2 and  it was felt it was safe enough for the patient to go home on steroids  from pulmonary point of view.  He will need followup with pulmonary for  evaluation of his BOOP and obstructive sleep apnea in two to three  weeks.  The patient was discharged on steroids and in stable condition.   DISCHARGE MEDICATIONS:  1. Prednisone 40 mg one p.o. daily for two weeks.  2. Guaifenesin 600 mg b.i.d. p.r.n.  3. Ranitidine 150 mg one p.o. b.i.d.   FOLLOWUP:  The patient is to followup with Dr. Shelle Iron in two to three  weeks for evaluation of BOOP and obstructive sleep apnea.      Theone Stanley, MD  Electronically  Signed     AEJ/MEDQ  D:  02/01/2006  T:  02/01/2006  Job:  [id number]   cc:   Barbaraann Share, MD,FCCP  [address]  [place]  [place] [address]

## 2010-06-29 NOTE — Procedures (Signed)
NAME:  Phillip Frye, Phillip Frye NO.:  [id number]   MEDICAL RECORD NO.:  [id number]          PATIENT TYPE:  OUT   LOCATION:  SLEEP CENTER                 FACILITY:  [facility]   PHYSICIAN:  Barbaraann Share, MD,FCCPDATE OF BIRTH:  07/12/1970   DATE OF STUDY:  04/04/2006                            NOCTURNAL POLYSOMNOGRAM   REFERRING PHYSICIAN:  Barbaraann Share, MD,FCCP   INDICATION FOR STUDY:  Hypersomnia with sleep apnea.   EPWORTH SLEEPINESS SCORE:  18.   SLEEP ARCHITECTURE:  The patient had a sleep time of 296 minutes with  decreased REM and never achieved slow wave sleep.  Sleep onset latency  was prolonged at 37 minutes as was REM onset at 155 minutes.  Sleep  efficiency was decreased at 76%.   RESPIRATORY DATA:  The patient was found to have 30 hypopneas and 369  apneas for an Apnea/hypopnea index of 81 events per hour.  The events  were not positional, but there was loud snoring noted throughout.  The  patient did not undergo slip night protocol secondary to the majority of  his events occurring after 1 a.m.   OXYGEN DATA:  The patient had oxygen saturation as low as 75% with his  obstructive evens in spit of wearing 2 liters of oxygen per nasal  cannula as he usually does at home.   CARDIAC DATA:  No clinically significant cardiac arrhythmias.   MOVEMENT-PARASOMNIA:  Small numbers of leg jerks without clinical  significance.   IMPRESSIONS-RECOMMENDATIONS:  Severe obstructive sleep apnea/hypopnea  syndrome with Apnea/hypopnea index of 81 events per hour and oxygen  saturation as low as 75%.  Treatment for this degree of sleep apnea  should focus primarily on weight loss as well as CPAP.      Barbaraann Share, MD,FCCP  Diplomate, American Board of Sleep  Medicine  Electronically Signed     [facility]/MEDQ  D:  04/13/2006 11:51:32  T:  04/13/2006 20:18:10  Job:  [id number]

## 2010-06-29 NOTE — Letter (Signed)
February 26, 2006    Marcene Duos, M.D.  [name] [place] [address]   RE:  Phillip Frye, Phillip Frye  MRN:  [id number]  /  DOB:  [birth year]   Dear Dr. Delrae Alfred,   It was my pleasure to see Phillip Frye at the [facility]  as an outpatient on February 26, 2006. As you know, he is a very nice 40-  year-old man who was recently hospitalized at [facility] with a severe  respiratory illness. In reviewing his records, it appears that he  presented with shortness of breath over a 2 week period. He underwent  extensive evaluation which included CT scan of the chest, echocardiogram  and close clinical followup and was ultimately diagnosed with post viral  BOOP/bronchiolitis. He was treated with IV steroids and slowly improved.   He presents today for followup. He tells me that he was in his normal  state of health prior to this and does have some shortness of breath  with activity even at his baseline. That appears to be his main problem  at this point. He has occasional chest pain but this seems to be  nonexertional. He denies orthopnea, PND, light-headedness, palpitations  or syncope. He complains of some lower extremity edema that has been  chronic. He continues to have dyspnea with relatively minimal activity  at present. His main cardiac complaint at this point is that of the  sensation of a rapid heart rate. To my understanding, he had an EKG  performed at [facility] that was concerning and that prompted his  referral here.   CURRENT MEDICATIONS:  None. The patient just completed a course of  prednisone and ranitidine.   ALLERGIES:  NKDA.   PAST MEDICAL HISTORY:  Pertinent for severe obstructive sleep apnea and  recent hospitalization for BOOP as described. The patient also has been  diagnosed with hypertension. He has had no surgeries and no other  hospitalizations.   FAMILY HISTORY:  The patient's sister died of complications from  diabetes at age  67. He has multiple other siblings who have no coronary  artery disease. There is no other coronary artery disease or congestive  heart failure in the family.   SOCIAL HISTORY:  The patient has previously worked as a Education administrator and  doing Surveyor, minerals work. He is not working currently. He is a former  smoker as he smoked 15 small cigars per day up until his hospitalization  in December. He has not used recreational drugs. He occasionally drinks  alcohol but not in excess. He does not do much exercise.   REVIEW OF SYSTEMS:  A complete 12-point review of systems was performed.  Pertinent positives included headaches, edema and breathing problems as  described.   PHYSICAL EXAMINATION:  GENERAL:  The patient is alert and oriented, he  is in no acute distress. He is an obese African-American male. His  weight is 322 pounds.  VITAL SIGNS:  Initial blood pressure was 154/100, repeat blood pressure  was 132/90 and that was on my check. Heart rate 93, respiratory rate 20.  HEENT:  Normal.  NECK:  Normal carotid upstrokes without bruits. Jugular venous pressure  is normal. There is no thyromegaly or thyroid nodules.  LUNGS:  Clear to auscultation bilaterally.  CARDIOVASCULAR:  The apex is not palpable. The heart is a regular rate  and rhythm without murmurs or gallops. There is no right ventricular  heave or lift.  ABDOMEN:  Soft, obese,  nontender, no organomegaly, normal bowel sounds,  no abdominal bruits.  EXTREMITIES:  There is no clubbing, cyanosis or edema. Peripheral pulses  are 2+ and equal throughout.  SKIN:  Warm and dry without rash.  NEUROLOGIC:  Strength is 5/5 in the arms and legs bilaterally. Cranial  nerves II-XII are intact.  LYMPHATICS:  There is no adenopathy.   EKG demonstrates normal sinus rhythm with marked anterolateral T wave  changes suggestive of ischemia. The patient's QT interval is prolonged  with a QTc of 473 msec. There is also a pattern of left atrial   enlargement and left ventricular hypertrophy.   Echocardiogram performed at [facility] demonstrated LV size at  the upper limit of normal. There were no regional wall motion  abnormalities, the left atrium is dilated. I do not see an estimated  left ventricular ejection fraction.   ASSESSMENT:  Phillip Frye is a 40 year old male who presents for  evaluation of a markedly abnormal EKG. He has had a recent respiratory  illness but has not had known cardiac disease in the past. I am  certainly concerned by his ST changes even though he does not have  typical symptoms for myocardial ischemia. He also has normal left  ventricular function by report. However, I think that in the setting of  his abnormal EKG he should undergo a diagnostic cardiac catheterization.  His T wave abnormality is suggestive of significant ischemia. It is  possible that he has dramatic left ventricular hypertrophy with  repolarization although the findings are not typical of ST changes  caused by left ventricular hypertrophy.   I have scheduled Phillip Frye for a left and right cardiac  catheterization both to assess his intracardiac pressures as well as his  left ventricular function and coronary anatomy. He was started on  bisoprolol yesterday at the [facility] in which I agree. I will  not make any other medicine changes until he undergoes his  catheterization next week. If he develops any chest pain, he should be  evaluated in the emergency department immediately.    Sincerely,      Veverly Fells. Excell Seltzer, MD  Electronically Signed    MDC/MedQ  DD: 02/26/2006  DT: 02/26/2006  Job #: [id number]

## 2010-06-29 NOTE — Cardiovascular Report (Signed)
Phillip Frye, Phillip Frye                ACCOUNT NO.:  [id number]   MEDICAL RECORD NO.:  [id number]          PATIENT TYPE:  INP   LOCATION:  3708                         FACILITY:  [facility]   PHYSICIAN:  Ricki Rodriguez, M.D.  DATE OF BIRTH:  Oct 15, 1970   DATE OF PROCEDURE:  03/12/2006  DATE OF DISCHARGE:                            CARDIAC CATHETERIZATION   PROCEDURES:  1. Right and left heart catheterization.  2. Selective coronary angiography.  3. Left ventricular function study and cardiac output study.   INDICATIONS:  This 40 year old black male had recurrent bronchitis along  with chest pain and abnormal stress test   APPROACH:  Right femoral artery using 4-French sheath and catheters and  the right femoral vein using 7-French sheath and 7-French Swan-Ganz  catheter.   COMPLICATIONS:  None.   Less than 55 mL of dye was used.   HEMODYNAMIC DATA:  The left ventricular pressure was 122/23 and aortic  pressure was 120/81 and the pulmonary artery pressure was 29/9.  RV  pressure was 30-35/10.  Right atrial pressure was 10-15/9-12.  Oxygen  saturation was 69% in pulmonary artery and 93% on blood sample from left  ventricle.  Cardiac output was 9.1 and cardiac index was 3.7 by thermal  dilution technique and cardiac output was 7.3 and cardiac index was 3 by  Fick method.  The SVR was low at 773 and pulmonary vascular resistance  was 110.   Left ventriculogram:  The left ventriculogram showed normal left  ventricular systolic function with ejection fraction of 70%.   Coronary anatomy:  The left coronary artery was short and unremarkable.   Left anterior descending coronary artery:  The left anterior descending  coronary artery was also unremarkable.  Its diagonal vessel was also  normal.   Left circumflex coronary artery:  The left circumflex coronary artery  was unremarkable.  Its obtuse marginal branch 1 was a very small vessel.  Obtuse marginal branch 2 and 3 were  unremarkable.   Right coronary artery:  The right artery was dominant and was normal and  the posterolateral branch and posterior descending coronary arteries  were also normal.   IMPRESSION:  1. Normal coronaries.  2. Normal LV systolic function.  3. Normal right heart pressures.   RECOMMENDATIONS:  This patient will continue noncardiac chest pain  evaluation and current medical therapy.      Ricki Rodriguez, M.D.  Electronically Signed     ASK/MEDQ  D:  03/12/2006  T:  03/12/2006  Job:  [id number]   cc:   Isidor Holts, M.D.

## 2010-06-29 NOTE — H&P (Signed)
NAMEJEANNE, Phillip Frye                ACCOUNT NO.:  [id number]   MEDICAL RECORD NO.:  [id number]          PATIENT TYPE:  INP   LOCATION:  1826                         FACILITY:  [facility]   PHYSICIAN:  Isidor Holts, M.D.  DATE OF BIRTH:  1970/11/25   DATE OF ADMISSION:  03/02/2006  DATE OF DISCHARGE:                              HISTORY & PHYSICAL   PMD:  Dr. Delrae Alfred at [facility].  Patient is unassigned to [place].   CHIEF COMPLAINT:  Fever, chills for the past few days, also headache,  chest pain, shortness of breath, abdominal pain for the past 2 weeks.   HISTORY OF PRESENT ILLNESS:  This is a 40 year old male, who was  admitted to [facility] from January 26, 2006, to  February 01, 2006, for a respiratory illness, subsequently identified to  be bronchiolitis obliterans with organizing pneumonia.  He was  discharged in satisfactory condition with arrangements made to follow up  with Dr. Marcelyn Bruins, pulmonologist, on an outpatient basis.  According  to patient, since discharge, he has not felt quite well.  His oxygen  saturations keep on dropping and he feels short of breath.  He  occasionally gets retrosternal chest pain and has felt increasingly weak  and continues to have a dry cough.  He had a chest CT scan on February 24, 2006, which showed no evidence of interstitial lung disease and the  ground glass appearance noted in 01/2006, had resolved.  Essentially, an  unremarkable chest CT scan.  Patient was seen by Dr. Marcelyn Bruins about  the same time and was assured that everything was ok.  He contacted his  primary M.D., Dr. Alden Benjamin at [facility], about his persisting  symptomatology and was referred to Dr. Excell Seltzer, [facility], who  saw him on February 26, 2006, and has arranged a cardiac catheterization  for March 12, 2006.  According to patient, on February 28, 2006, he  went to the emergency department at [facility] with  complaints of headache, chest pain, and abdominal pain, and was  subsequently discharged on oxycodone.  Of note, a chest x-ray done on  March 01, 2006, showed low lung volumes and mild bronchitic changes.  In a.m. of March 01, 2006, the patient woke up with chills and a  fever, and at about 1:30 p.m, came to the emergency department.   PAST MEDICAL HISTORY:  1. Obstructive sleep apnea on nocturnal CPAP.  Patient is scheduled      for sleep study on March 04, 2006.  2. Bronchiolitis obliterans with organizing pneumonia diagnosed during      admission January 26, 2006 to February 01, 2006.  The patient      follows up with Dr. Marcelyn Bruins, pulmonologist.  3. Smoker, quit December 2007.  4. GERD.   MEDICATIONS:  1. Zantac 150 mg p.o. b.i.d.  2. Oxycodone/APAP p.r.n.   ALLERGIES:  NO KNOWN DRUG ALLERGIES.   REVIEW OF SYSTEMS:  As per HPI and chief complaint.  Patient denies  vomiting or diarrhea.   SOCIAL HISTORY:  Patient works in  industry, is single, lives with his  sister, and her 2 children.  They are in good health.  Drinks alcohol  only occasionally.  He use to smoke about 15 small cigars a day for 10  to 15 years, but quit in December 2007.  Occasionally he utilizes  marijuana.  Has no other history of drug abuse.   FAMILY HISTORY:  Is otherwise noncontributory.   PHYSICAL EXAMINATION:  VITALS:  Temperature maximum 102.0, pulse 91 per  minute, respiratory 18, BP 137/72 mmHg, pulse oximeter 99% on 2 liters  of oxygen.  GENERAL:  Patient does not appear to be in obvious acute distress;  however, feels quite cold I have just been getting chills.  Alert,  communicative, not short of breath at rest.  Complains of headache.  HEENT:  No clinical pallor, no jaundice, no conjunctival injection.  NECK:  Supple, JVP not seen, secondary to fat neck.  No palpable  lymphadenopathy, no palpable goiter.  CHEST:  Clear to auscultation, no wheezes, no crackles.  HEART:  Heart  sounds 1 and 2 heard, normal, regular, no murmurs.  ABDOMEN:  Obese, soft, tender in the left lower quadrant and along the  left intestinal region, no guarding, bowel sounds are normal.  EXTREMITIES:  Lower extremity examination:  No pitting edema.  Palpable  peripheral pulses.  MUSCULOSKELETAL:  System examination was quite unremarkable.  CENTRAL NERVOUS SYSTEM:  No focal neurologic deficits on gross  examination.   INVESTIGATIONS:  CBC:  WBC 5.6, hemoglobin 13.4, hematocrit 38.9,  platelets 295.  Electrolytes:  Sodium 134, potassium 3.9, chloride 102,  CO2 25, BUN 5, creatinine 1.04, glucose 131.  AST 30.  Urinalysis is  negative.  Chest x-ray dated March 02, 2006, shows lungs volumes no  acute disease.   ASSESSMENT/PLAN:  1. Febrile illness:  Chest x-ray is negative on March 02, 2006,      although there are bronchitic changes on chest x-ray of March 01, 2006.  Urinalysis is also negative.  Query viral etiology of      febrile illness.  We shall do septic workup with blood cultures and      cover empirically with Avelox for possible acute bronchitis.   1. Abdominal pain.  Although patient is young for diverticulosis,      anatomical location of pain appears consistent with diverticulitis.      This has therefore got to be ruled out as well as other      intraabdominal pathology.  We shall therefore arrange an abdominal      CT scan.   1. Obstructive sleep apnea.  We shall continue CPAP.   1. Shortness of breath, hypoxemia, retrosternal chest pain.  It is      important to rule out a possible cardiomyopathy versus acute      pericarditis, although patient did have a 2-D echocardiogram on      January 31, 2006, which showed no striking findings.  We shall      repeat 2-D echocardiogram, and start patient on NSAID therapy.  He      was scheduled to have a cardiac catheterization on March 12, 2006, by Dr. Excell Seltzer of [facility].  Now, however, he wants      Dr. Algie Coffer involved.  We shall therefore consult Dr. Algie Coffer per      the family`s wishes.   1. Gastroesophageal reflux disease.  We shall continue proton pump  inhibitor treatment.   Further management will depend on clinical course.      Isidor Holts, M.D.  Electronically Signed     CO/MEDQ  D:  03/02/2006  T:  03/02/2006  Job:  [id number]   cc:   Marcene Duos, M.D.  Barbaraann Share, MD,FCCP

## 2010-09-04 ENCOUNTER — Encounter: Payer: Medicare Other | Attending: Neurosurgery | Admitting: Neurosurgery

## 2010-09-04 DIAGNOSIS — M25559 Pain in unspecified hip: Secondary | ICD-10-CM | POA: Insufficient documentation

## 2010-09-04 DIAGNOSIS — G894 Chronic pain syndrome: Secondary | ICD-10-CM

## 2010-09-04 DIAGNOSIS — M87059 Idiopathic aseptic necrosis of unspecified femur: Secondary | ICD-10-CM | POA: Insufficient documentation

## 2010-09-04 NOTE — Assessment & Plan Note (Signed)
Account [id number].  This is a patient of Dr. Wynn Banker seen for bilateral hip pain.  He does have osteonecrosis and states he got a hip surgery pending at [facility] but he does not want to do it __________.  The patient is on a nonnarcotic regimen due to being positive THC.  He rates his pain at about 5 or 6.  Sleep patterns are poor.  Pain is worse with standing. Rest and medication tend to help.  REVIEW OF SYSTEMS:  Notable for those difficulties as well as poor appetite and some wheezing, otherwise within normal limits.  PAST MEDICAL HISTORY:  Unchanged.  SOCIAL HISTORY:  Single.  FAMILY HISTORY:  Unchanged.  The patient states he wants to return to work and is asking for narcotics.  I declined any kind of narcotic treatment and told him we would not provide narcotics and he stated understanding.  PHYSICAL EXAMINATION:  VITAL SIGNS:  His blood pressure is 147/87, pulse 100, respirations 18, O2 sats 99 on room air.  His motor strength is 5/5 in lower extremities.  His sensation is intact.  He is constitutionally obese.  He is alert and oriented x3.  IMPRESSION:  Avascular necrosis of the hip bilaterally, pending surgery at [facility]  PLAN:  We will prescribe Mobic 15 mg one p.o. daily 30 with three refills.  He understands how to use this.  He states the tramadol really does not do him any good and we did not provide another prescription. We will see him back here in 4 months.  His questions were encouraged and answered.     Cimone Fahey L. Blima Dessert Electronically Signed    RLW/MedQ D:  09/04/2010 13:32:04  T:  09/04/2010 23:48:31  Job #:  [id number]

## 2010-10-20 ENCOUNTER — Inpatient Hospital Stay (INDEPENDENT_AMBULATORY_CARE_PROVIDER_SITE_OTHER)
Admission: RE | Admit: 2010-10-20 | Discharge: 2010-10-20 | Disposition: A | Discharge status: Home / Self Care | Payer: Medicare Other | Source: Ambulatory Visit | Attending: Family Medicine | Admitting: Family Medicine

## 2010-10-20 DIAGNOSIS — K029 Dental caries, unspecified: Secondary | ICD-10-CM

## 2010-10-20 DIAGNOSIS — K089 Disorder of teeth and supporting structures, unspecified: Secondary | ICD-10-CM

## 2010-11-09 LAB — URINALYSIS, ROUTINE W REFLEX MICROSCOPIC
Bilirubin Urine: NEGATIVE
Glucose, UA: NEGATIVE
Hgb urine dipstick: NEGATIVE
Ketones, ur: NEGATIVE
Nitrite: NEGATIVE
Protein, ur: NEGATIVE
Specific Gravity, Urine: 1.029
Urobilinogen, UA: 1
pH: 6.5

## 2010-11-09 LAB — POCT I-STAT, CHEM 8
BUN: 17
Calcium, Ion: 1.19
Chloride: 104
Creatinine, Ser: 1.3
Glucose, Bld: 111 — ABNORMAL HIGH
HCT: 44
Hemoglobin: 15
Potassium: 3.8
Sodium: 139
TCO2: 25

## 2010-11-28 ENCOUNTER — Inpatient Hospital Stay (HOSPITAL_COMMUNITY)
Admission: RE | Admit: 2010-11-28 | Discharge: 2010-11-28 | Disposition: A | Discharge status: Home / Self Care | Payer: Medicare Other | Source: Ambulatory Visit | Attending: Family Medicine | Admitting: Family Medicine

## 2010-12-17 ENCOUNTER — Ambulatory Visit: Payer: Medicare Other | Admitting: Physical Medicine & Rehabilitation

## 2010-12-18 ENCOUNTER — Emergency Department (HOSPITAL_COMMUNITY)
Admission: EM | Admit: 2010-12-18 | Discharge: 2010-12-19 | Discharge status: Against Medical Advice | Payer: Medicare Other | Attending: Emergency Medicine | Admitting: Emergency Medicine

## 2010-12-18 ENCOUNTER — Encounter: Payer: Self-pay | Admitting: *Deleted

## 2010-12-18 DIAGNOSIS — R51 Headache: Secondary | ICD-10-CM | POA: Insufficient documentation

## 2010-12-18 HISTORY — DX: Sleep apnea, unspecified: G47.30

## 2010-12-18 HISTORY — DX: Essential (primary) hypertension: I10

## 2010-12-18 HISTORY — DX: Unspecified osteoarthritis, unspecified site: M19.90

## 2010-12-18 NOTE — ED Notes (Signed)
Lt side face pain he thinks his teeth are causing the pain.  He has had this pain for 30  days

## 2010-12-19 ENCOUNTER — Emergency Department (HOSPITAL_COMMUNITY)
Admission: EM | Admit: 2010-12-19 | Discharge: 2010-12-19 | Disposition: A | Discharge status: Home / Self Care | Payer: Medicare Other | Attending: Emergency Medicine | Admitting: Emergency Medicine

## 2010-12-19 ENCOUNTER — Encounter (HOSPITAL_COMMUNITY): Payer: Self-pay | Admitting: *Deleted

## 2010-12-19 DIAGNOSIS — G4730 Sleep apnea, unspecified: Secondary | ICD-10-CM | POA: Insufficient documentation

## 2010-12-19 DIAGNOSIS — I10 Essential (primary) hypertension: Secondary | ICD-10-CM | POA: Insufficient documentation

## 2010-12-19 DIAGNOSIS — Z8739 Personal history of other diseases of the musculoskeletal system and connective tissue: Secondary | ICD-10-CM | POA: Insufficient documentation

## 2010-12-19 DIAGNOSIS — R51 Headache: Secondary | ICD-10-CM | POA: Insufficient documentation

## 2010-12-19 DIAGNOSIS — K029 Dental caries, unspecified: Secondary | ICD-10-CM | POA: Insufficient documentation

## 2010-12-19 MED ORDER — IBUPROFEN 800 MG PO TABS: 800.0000 mg | ORAL_TABLET | Freq: Three times a day (TID) | ORAL | Status: AC

## 2010-12-19 MED ORDER — PENICILLIN V POTASSIUM 500 MG PO TABS: 500.0000 mg | ORAL_TABLET | Freq: Three times a day (TID) | ORAL | Status: AC

## 2010-12-19 MED ORDER — HYDROCODONE-ACETAMINOPHEN 5-325 MG PO TABS: 1.0000 | ORAL_TABLET | Freq: Four times a day (QID) | ORAL | Status: AC | PRN

## 2010-12-19 MED ORDER — HYDROCODONE-ACETAMINOPHEN 5-325 MG PO TABS
1.0000 | ORAL_TABLET | Freq: Once | ORAL | Status: AC
  Administered 2010-12-19: 1 via ORAL
  Filled 2010-12-19: qty 1

## 2010-12-19 NOTE — ED Notes (Signed)
Pt left AMA °

## 2010-12-19 NOTE — ED Notes (Signed)
Pt in c/o right upper toothache x1 month, also causing headache, pt states pain is intermittent and increased tonight, unable to see dentist until after first of year

## 2010-12-19 NOTE — ED Notes (Signed)
Pt left after being triaged.

## 2010-12-19 NOTE — ED Provider Notes (Signed)
History     CSN: [id number] Arrival date & time: 12/19/2010  7:04 PM   Patient is a 40 y.o. male presenting with tooth pain. The history is provided by the patient.  Dental PainThe primary symptoms include mouth pain and headaches. Primary symptoms comment: Multiple dental caries The symptoms are worsening. The symptoms are chronic. The symptoms occur constantly.  Episode onset: A month. The headache developed gradually. Headache is a recurrent problem. The headache is present intermittently. Location/region(s) of the headache: frontal. The headache is not associated with aura, photophobia, eye pain, visual change, neck stiffness, paresthesias, weakness or loss of balance.  Additional symptoms include: dental sensitivity to temperature, gum swelling and gum tenderness. Additional symptoms do not include: purulent gums, trismus, jaw pain, facial swelling, trouble swallowing, pain with swallowing, dry mouth, ear pain, hearing loss and swollen glands. Medical issues include: periodontal disease.   patient reports several teeth have completely decayed. Reports he is unable to get insurance to the beginning of January. States recently dental pain is radiating into head. Reports intermittent pressure like frontal headaches. Denies fever, difficulty swallowing, facial swelling, throat swelling, difficulty breathing.   Past Medical History  Diagnosis Date  . Sleep apnea   . Hypertension   . Arthritis     History reviewed. No pertinent past surgical history.  History reviewed. No pertinent family history.  History  Substance Use Topics  . Smoking status: Current Everyday Smoker  . Smokeless tobacco: Not on file  . Alcohol Use: Yes      Review of Systems  HENT: Positive for dental problem. Negative for hearing loss, ear pain, facial swelling, mouth sores, trouble swallowing and neck stiffness.        Dental cavities  Eyes: Negative for photophobia and pain.  Neurological: Positive for  headaches. Negative for weakness, paresthesias and loss of balance.    Allergies  Review of patient's allergies indicates no known allergies.  Home Medications   Current Outpatient Rx  Name Route Sig Dispense Refill  . IBUPROFEN 800 MG PO TABS Oral Take 800 mg by mouth every 8 (eight) hours as needed. For pain     . METOPROLOL TARTRATE 50 MG PO TABS Oral Take 50 mg by mouth 2 (two) times daily.      . TRAMADOL HCL 50 MG PO TABS Oral Take 50 mg by mouth 2 (two) times daily as needed. For pain       BP 150/96  Pulse 92  Temp(Src) 98.5 F (36.9 C) (Oral)  Resp 20  SpO2 100%  Physical Exam  Constitutional: He is oriented to person, place, and time. He appears well-developed and well-nourished.  HENT:  Head: Normocephalic and atraumatic.  Mouth/Throat: Uvula is midline, oropharynx is clear and moist and mucous membranes are normal. Abnormal dentition. Dental caries present. No dental abscesses, uvula swelling or lacerations.  Eyes: Pupils are equal, round, and reactive to light.  Neurological: He is alert and oriented to person, place, and time.  Skin: Skin is warm and dry. No rash noted. No erythema. No pallor.  Psychiatric: He has a normal mood and affect. His behavior is normal.    ED Course  Procedures    MDM          Thomasene Lot, [place] 12/19/10 2134

## 2010-12-20 NOTE — ED Provider Notes (Signed)
Medical screening examination/treatment/procedure(s) were performed by non-physician practitioner and as supervising physician I was immediately available for consultation/collaboration.   Laray Anger, DO 12/20/10 [phone]

## 2010-12-27 ENCOUNTER — Ambulatory Visit: Payer: Medicare Other | Admitting: Physical Medicine & Rehabilitation

## 2010-12-27 ENCOUNTER — Encounter: Payer: Medicare Other | Attending: Physical Medicine & Rehabilitation

## 2010-12-28 ENCOUNTER — Ambulatory Visit: Payer: Medicare Other | Admitting: Physical Medicine & Rehabilitation

## 2011-04-21 ENCOUNTER — Emergency Department (HOSPITAL_COMMUNITY)
Admission: EM | Admit: 2011-04-21 | Discharge: 2011-04-21 | Disposition: A | Discharge status: Home / Self Care | Payer: Medicare Other | Attending: Emergency Medicine | Admitting: Emergency Medicine

## 2011-04-21 ENCOUNTER — Encounter (HOSPITAL_COMMUNITY): Payer: Self-pay | Admitting: *Deleted

## 2011-04-21 DIAGNOSIS — J029 Acute pharyngitis, unspecified: Secondary | ICD-10-CM | POA: Insufficient documentation

## 2011-04-21 DIAGNOSIS — R000 Tachycardia, unspecified: Secondary | ICD-10-CM | POA: Insufficient documentation

## 2011-04-21 DIAGNOSIS — R0602 Shortness of breath: Secondary | ICD-10-CM | POA: Insufficient documentation

## 2011-04-21 LAB — RAPID STREP SCREEN (MED CTR MEBANE ONLY): Streptococcus, Group A Screen (Direct): NEGATIVE

## 2011-04-21 MED ORDER — DEXAMETHASONE SODIUM PHOSPHATE 10 MG/ML IJ SOLN
10.0000 mg | Freq: Once | INTRAMUSCULAR | Status: AC
  Administered 2011-04-21: 10 mg via INTRAVENOUS
  Filled 2011-04-21: qty 1

## 2011-04-21 MED ORDER — SODIUM CHLORIDE 0.9 % IV BOLUS (SEPSIS)
1000.0000 mL | Freq: Once | INTRAVENOUS | Status: AC
  Administered 2011-04-21: 1000 mL via INTRAVENOUS

## 2011-04-21 MED ORDER — KETOROLAC TROMETHAMINE 30 MG/ML IJ SOLN
30.0000 mg | Freq: Once | INTRAMUSCULAR | Status: AC
  Administered 2011-04-21: 30 mg via INTRAVENOUS
  Filled 2011-04-21: qty 1

## 2011-04-21 NOTE — ED Notes (Signed)
Pt states that he has been experiencing a sore throat and shortness of breath x 3 days.  Pt presents slightly diaphoretic and appearing to have some difficulty breathing.  Pt satting 99% at this time.  Pt states that he uses O2 at home PRN x several years and uses a CPAP machine at night.

## 2011-04-21 NOTE — ED Provider Notes (Signed)
History     CSN: [id number]  Arrival date & time 04/21/11  1927   First MD Initiated Contact with Patient 04/21/11 2127      Chief Complaint  Patient presents with  . Shortness of Breath  . Sore Throat    (Consider location/radiation/quality/duration/timing/severity/associated sxs/prior treatment) HPI Comments: Patient presents with 3 days of worsening sore throat.  It hurts worse to swallow.  Patient notes some mild hoarseness of voice.  He's had some associated mild diarrhea.  No significant abdominal pain.  No shortness of breath.  Patient has not noted any specific fevers.  He comes in because he now feels slightly lightheaded with standing as he's had significantly decreased by mouth intake since the onset of the sore throat 3 days ago.  Patient is a 41 y.o. male presenting with pharyngitis. The history is provided by the patient. No language interpreter was used.  Sore Throat This is a new problem. The current episode started more than 2 days ago. The problem occurs constantly. The problem has been gradually worsening. Pertinent negatives include no chest pain, no abdominal pain, no headaches and no shortness of breath. The symptoms are aggravated by swallowing. The symptoms are relieved by nothing. He has tried nothing for the symptoms.    Past Medical History  Diagnosis Date  . Sleep apnea   . Hypertension   . Arthritis     History reviewed. No pertinent past surgical history.  History reviewed. No pertinent family history.  History  Substance Use Topics  . Smoking status: Current Everyday Smoker  . Smokeless tobacco: Not on file  . Alcohol Use: Yes      Review of Systems  Constitutional: Negative.  Negative for fever and chills.  HENT: Positive for sore throat.   Eyes: Negative.  Negative for discharge and redness.  Respiratory: Negative for cough and shortness of breath.   Cardiovascular: Negative.  Negative for chest pain.  Gastrointestinal: Negative.   Negative for nausea, vomiting and abdominal pain.  Genitourinary: Negative.  Negative for hematuria.  Musculoskeletal: Negative.  Negative for back pain.  Skin: Negative.  Negative for color change and rash.  Neurological: Negative for syncope and headaches.  Hematological: Negative.  Negative for adenopathy.  Psychiatric/Behavioral: Negative.  Negative for confusion.  All other systems reviewed and are negative.    Allergies  Review of patient's allergies indicates no known allergies.  Home Medications   Current Outpatient Rx  Name Route Sig Dispense Refill  . IBUPROFEN 800 MG PO TABS Oral Take 800 mg by mouth every 8 (eight) hours as needed. For pain     . METOPROLOL TARTRATE 50 MG PO TABS Oral Take 50 mg by mouth 2 (two) times daily.      . TRAMADOL HCL 50 MG PO TABS Oral Take 50 mg by mouth 2 (two) times daily as needed. For pain       BP 154/91  Pulse 115  Temp(Src) 99.9 F (37.7 C) (Oral)  Resp 21  SpO2 98%  Physical Exam  Nursing note and vitals reviewed. Constitutional: He is oriented to person, place, and time. He appears well-developed and well-nourished.  Non-toxic appearance. He does not have a sickly appearance.  HENT:  Head: Normocephalic and atraumatic.  Mouth/Throat: No oropharyngeal exudate.       Bilaterally swollen tonsils with no exudates, uvula is midline  Eyes: Conjunctivae, EOM and lids are normal. Pupils are equal, round, and reactive to light.  Neck: Trachea normal, normal range of motion  and full passive range of motion without pain. Neck supple.  Cardiovascular: Regular rhythm, S1 normal, S2 normal and normal heart sounds.  Tachycardia present.   Pulmonary/Chest: Effort normal and breath sounds normal. No respiratory distress. He has no decreased breath sounds. He has no wheezes. He has no rhonchi. He has no rales.  Abdominal: Soft. Normal appearance. He exhibits no distension. There is no tenderness. There is no rebound and no CVA tenderness.    Musculoskeletal: Normal range of motion.  Neurological: He is alert and oriented to person, place, and time. He has normal strength.  Skin: Skin is warm, dry and intact. No rash noted.  Psychiatric: He has a normal mood and affect. His behavior is normal. Judgment and thought content normal.    ED Course  Procedures (including critical care time)  Results for orders placed during the hospital encounter of 04/21/11  RAPID STREP SCREEN      Component Value Range   Streptococcus, Group A Screen (Direct) NEGATIVE  NEGATIVE       MDM  Patient with likely viral pharyngitis given his strep screen is negative.  Patient has received a liter of fluids now and did not feel dizzy upon standing anymore.  Patient wishes to go home at this time.  I've counseled him regarding continued fluid intake at home as well as Tylenol and ibuprofen for pain.        Nat Christen, MD 04/21/11 [phone]

## 2011-04-21 NOTE — Discharge Instructions (Signed)

## 2011-10-31 ENCOUNTER — Emergency Department (HOSPITAL_COMMUNITY): Payer: PRIVATE HEALTH INSURANCE

## 2011-10-31 ENCOUNTER — Encounter (HOSPITAL_COMMUNITY): Payer: Self-pay | Admitting: *Deleted

## 2011-10-31 ENCOUNTER — Emergency Department (HOSPITAL_COMMUNITY)
Admission: EM | Admit: 2011-10-31 | Discharge: 2011-10-31 | Disposition: A | Discharge status: Home / Self Care | Payer: PRIVATE HEALTH INSURANCE | Attending: Emergency Medicine | Admitting: Emergency Medicine

## 2011-10-31 DIAGNOSIS — K2990 Gastroduodenitis, unspecified, without bleeding: Secondary | ICD-10-CM | POA: Insufficient documentation

## 2011-10-31 DIAGNOSIS — K2970 Gastritis, unspecified, without bleeding: Secondary | ICD-10-CM

## 2011-10-31 DIAGNOSIS — I10 Essential (primary) hypertension: Secondary | ICD-10-CM | POA: Insufficient documentation

## 2011-10-31 LAB — URINE MICROSCOPIC-ADD ON

## 2011-10-31 LAB — CBC WITH DIFFERENTIAL/PLATELET
Basophils Absolute: 0 10*3/uL (ref 0.0–0.1)
Basophils Relative: 0 % (ref 0–1)
Eosinophils Absolute: 0.1 10*3/uL (ref 0.0–0.7)
Eosinophils Relative: 1 % (ref 0–5)
HCT: 40.5 % (ref 39.0–52.0)
Hemoglobin: 13.9 g/dL (ref 13.0–17.0)
Lymphocytes Relative: 47 % — ABNORMAL HIGH (ref 12–46)
Lymphs Abs: 2.9 10*3/uL (ref 0.7–4.0)
MCH: 31 pg (ref 26.0–34.0)
MCHC: 34.3 g/dL (ref 30.0–36.0)
MCV: 90.4 fL (ref 78.0–100.0)
Monocytes Absolute: 0.7 10*3/uL (ref 0.1–1.0)
Monocytes Relative: 12 % (ref 3–12)
Neutro Abs: 2.4 10*3/uL (ref 1.7–7.7)
Neutrophils Relative %: 40 % — ABNORMAL LOW (ref 43–77)
Platelets: 251 10*3/uL (ref 150–400)
RBC: 4.48 MIL/uL (ref 4.22–5.81)
RDW: 12.1 % (ref 11.5–15.5)
WBC: 6.2 10*3/uL (ref 4.0–10.5)

## 2011-10-31 LAB — COMPREHENSIVE METABOLIC PANEL
ALT: 31 U/L (ref 0–53)
AST: 20 U/L (ref 0–37)
Albumin: 3.5 g/dL (ref 3.5–5.2)
Alkaline Phosphatase: 86 U/L (ref 39–117)
BUN: 10 mg/dL (ref 6–23)
CO2: 29 mEq/L (ref 19–32)
Calcium: 9.5 mg/dL (ref 8.4–10.5)
Chloride: 96 mEq/L (ref 96–112)
Creatinine, Ser: 0.97 mg/dL (ref 0.50–1.35)
GFR calc Af Amer: >90 mL/min (ref >90)
GFR calc non Af Amer: >90 mL/min (ref >90)
Glucose, Bld: 254 mg/dL — ABNORMAL HIGH (ref 70–99)
Potassium: 3.7 mEq/L (ref 3.5–5.1)
Sodium: 132 mEq/L — ABNORMAL LOW (ref 135–145)
Total Bilirubin: 0.2 mg/dL — ABNORMAL LOW (ref 0.3–1.2)
Total Protein: 6.6 g/dL (ref 6.0–8.3)

## 2011-10-31 LAB — URINALYSIS, ROUTINE W REFLEX MICROSCOPIC
Bilirubin Urine: NEGATIVE
Glucose, UA: >1000 mg/dL — AB
Hgb urine dipstick: NEGATIVE
Ketones, ur: NEGATIVE mg/dL
Leukocytes, UA: NEGATIVE
Nitrite: NEGATIVE
Protein, ur: NEGATIVE mg/dL
Specific Gravity, Urine: 1.027 (ref 1.005–1.030)
Urobilinogen, UA: 1 mg/dL (ref 0.0–1.0)
pH: 6.5 (ref 5.0–8.0)

## 2011-10-31 MED ORDER — SUCRALFATE 1 GM/10ML PO SUSP: 1.0000 g | Freq: Four times a day (QID) | ORAL | Status: DC

## 2011-10-31 MED ORDER — MORPHINE SULFATE 4 MG/ML IJ SOLN
4.0000 mg | Freq: Once | INTRAMUSCULAR | Status: AC
  Administered 2011-10-31: 4 mg via INTRAVENOUS
  Filled 2011-10-31: qty 1

## 2011-10-31 MED ORDER — SODIUM CHLORIDE 0.9 % IV BOLUS (SEPSIS)
1000.0000 mL | Freq: Once | INTRAVENOUS | Status: AC
  Administered 2011-10-31: 1000 mL via INTRAVENOUS

## 2011-10-31 MED ORDER — HYDROCODONE-ACETAMINOPHEN 5-325 MG PO TABS: 1.0000 | ORAL_TABLET | ORAL | Status: DC | PRN

## 2011-10-31 MED ORDER — ONDANSETRON HCL 4 MG/2ML IJ SOLN
4.0000 mg | Freq: Once | INTRAMUSCULAR | Status: AC
  Administered 2011-10-31: 4 mg via INTRAVENOUS
  Filled 2011-10-31: qty 2

## 2011-10-31 NOTE — ED Notes (Addendum)
Pt reports of RUQ pain. PCP told pt to come to ED for abdominal scan. Pt denies n/v/d. Pain is constant, pain states pain increases with cough. Pt states he is always short of breath but denies any increase in shortness of breath

## 2011-10-31 NOTE — ED Provider Notes (Signed)
History     CSN: [id number]  Arrival date & time 10/31/11  1940   First MD Initiated Contact with Patient 10/31/11 2112      Chief Complaint  Patient presents with  . Abdominal Pain    RUQ    (Consider location/radiation/quality/duration/timing/severity/associated sxs/prior treatment) HPI Pt p/w RUQ pain starting this AM at 0600. Pain is not associated with foods, N/V/D. No previously similar pain. No fever chills. Pt was seen by PMD and referred to ED for eval of GB. Pt has had no prev surgeries Past Medical History  Diagnosis Date  . Sleep apnea   . Hypertension   . Arthritis     No past surgical history on file.  No family history on file.  History  Substance Use Topics  . Smoking status: Current Every Day Smoker  . Smokeless tobacco: Never Used  . Alcohol Use: Yes      Review of Systems  Constitutional: Negative for fever and chills.  Respiratory: Negative for cough and shortness of breath.   Cardiovascular: Negative for chest pain.  Gastrointestinal: Positive for abdominal pain. Negative for nausea, vomiting and diarrhea.  Genitourinary: Negative for hematuria and flank pain.  Musculoskeletal: Negative for back pain.  Skin: Negative for rash and wound.  Neurological: Negative for dizziness, weakness, light-headedness, numbness and headaches.    Allergies  Review of patient's allergies indicates no known allergies.  Home Medications   Current Outpatient Rx  Name Route Sig Dispense Refill  . HYDROCODONE-ACETAMINOPHEN 5-325 MG PO TABS Oral Take 1 tablet by mouth every 4 (four) hours as needed for pain. 10 tablet 0  . SUCRALFATE 1 GM/10ML PO SUSP Oral Take 10 mLs (1 g total) by mouth 4 (four) times daily. 420 mL 0    BP 114/71  Pulse 99  Temp 98.6 F (37 C) (Oral)  Resp 20  Ht 6\' 2"  (1.88 m)  Wt 386 lb (175.088 kg)  BMI 49.56 kg/m2  SpO2 92%  Physical Exam  Nursing note and vitals reviewed. Constitutional: He is oriented to person, place, and  time. He appears well-developed and well-nourished. No distress.       obese  HENT:  Head: Normocephalic and atraumatic.  Mouth/Throat: Oropharynx is clear and moist.  Eyes: EOM are normal. Pupils are equal, round, and reactive to light.  Neck: Normal range of motion. Neck supple.  Cardiovascular: Normal rate and regular rhythm.   Pulmonary/Chest: Effort normal and breath sounds normal. No respiratory distress. He has no wheezes. He has no rales.  Abdominal: Soft. Bowel sounds are normal. He exhibits no distension and no mass. There is tenderness (TTP of RUQ). There is no rebound and no guarding.  Musculoskeletal: Normal range of motion. He exhibits no edema and no tenderness.  Neurological: He is alert and oriented to person, place, and time.  Skin: Skin is warm and dry. No rash noted. No erythema.  Psychiatric: He has a normal mood and affect. His behavior is normal.    ED Course  Procedures (including critical care time)  Labs Reviewed  URINALYSIS, ROUTINE W REFLEX MICROSCOPIC - Abnormal; Notable for the following:    Glucose, UA >1000 (*)     All other components within normal limits  CBC WITH DIFFERENTIAL - Abnormal; Notable for the following:    Neutrophils Relative 40 (*)     Lymphocytes Relative 47 (*)     All other components within normal limits  COMPREHENSIVE METABOLIC PANEL - Abnormal; Notable for the following:  Sodium 132 (*)     Glucose, Bld 254 (*)     Total Bilirubin 0.2 (*)     All other components within normal limits  URINE MICROSCOPIC-ADD ON   US Abdomen Complete  10/31/2011  *RADIOLOGY REPORT*  Clinical Data:  Abdominal pain.  COMPLETE ABDOMINAL ULTRASOUND  Comparison:  CT abdomen and pelvis 03/02/2006.  Findings:  Gallbladder:  No gallstones, gallbladder wall thickening, or pericholecystic fluid.  Common bile duct:  Measures 0.4 cm.  Liver:  No focal lesion identified.  Within normal limits in parenchymal echogenicity.  IVC:  Appears normal.  Pancreas:  No  focal abnormality seen.  Spleen:  Measures 7.4 cm and appears normal.  Right Kidney:  Measures 11.6 cm and appears normal.  Left Kidney:  Measures 11.9 cm and appears normal.  Abdominal aorta:  No aneurysm identified.  IMPRESSION: Negative abdominal ultrasound.   Original Report Authenticated By: Bernadene Bell. D'ALESSIO, M.D.      1. Gastritis       MDM  PT states symptoms have resolved and is resting comfortably. O2 sats drop when sleeping consistent with history of sleep apnea. Pt has history of gastric ulcers and is on nexium. States that his symptoms are the same. I have advised him to try over the counter antacids and will give GI f/u for persistent symptoms.         Loren Racer, MD 10/31/11 [phone]

## 2012-09-11 DIAGNOSIS — I2699 Other pulmonary embolism without acute cor pulmonale: Secondary | ICD-10-CM

## 2012-09-11 HISTORY — DX: Other pulmonary embolism without acute cor pulmonale: I26.99

## 2012-09-21 ENCOUNTER — Emergency Department (INDEPENDENT_AMBULATORY_CARE_PROVIDER_SITE_OTHER)
Admission: EM | Admit: 2012-09-21 | Discharge: 2012-09-21 | Disposition: A | Discharge status: Nursing Home (Includes ICF) | Payer: Medicare Other | Source: Home / Self Care

## 2012-09-21 ENCOUNTER — Encounter (HOSPITAL_COMMUNITY): Payer: Self-pay | Admitting: *Deleted

## 2012-09-21 ENCOUNTER — Inpatient Hospital Stay (HOSPITAL_COMMUNITY)
Admission: EM | Admit: 2012-09-21 | Discharge: 2012-09-23 | DRG: 176 | Disposition: A | Discharge status: Home / Self Care | Payer: Medicare Other | Attending: Internal Medicine | Admitting: Internal Medicine

## 2012-09-21 ENCOUNTER — Encounter (HOSPITAL_COMMUNITY): Payer: Self-pay | Admitting: Emergency Medicine

## 2012-09-21 ENCOUNTER — Emergency Department (INDEPENDENT_AMBULATORY_CARE_PROVIDER_SITE_OTHER): Payer: Medicare Other

## 2012-09-21 DIAGNOSIS — R0902 Hypoxemia: Secondary | ICD-10-CM

## 2012-09-21 DIAGNOSIS — I2699 Other pulmonary embolism without acute cor pulmonale: Principal | ICD-10-CM | POA: Diagnosis present

## 2012-09-21 DIAGNOSIS — I10 Essential (primary) hypertension: Secondary | ICD-10-CM | POA: Diagnosis present

## 2012-09-21 DIAGNOSIS — K219 Gastro-esophageal reflux disease without esophagitis: Secondary | ICD-10-CM

## 2012-09-21 DIAGNOSIS — R05 Cough: Secondary | ICD-10-CM

## 2012-09-21 DIAGNOSIS — K5900 Constipation, unspecified: Secondary | ICD-10-CM

## 2012-09-21 DIAGNOSIS — E119 Type 2 diabetes mellitus without complications: Secondary | ICD-10-CM | POA: Diagnosis present

## 2012-09-21 DIAGNOSIS — M79609 Pain in unspecified limb: Secondary | ICD-10-CM

## 2012-09-21 DIAGNOSIS — R059 Cough, unspecified: Secondary | ICD-10-CM

## 2012-09-21 DIAGNOSIS — F17200 Nicotine dependence, unspecified, uncomplicated: Secondary | ICD-10-CM | POA: Diagnosis present

## 2012-09-21 DIAGNOSIS — E669 Obesity, unspecified: Secondary | ICD-10-CM

## 2012-09-21 DIAGNOSIS — Z6841 Body Mass Index (BMI) 40.0 and over, adult: Secondary | ICD-10-CM

## 2012-09-21 DIAGNOSIS — M129 Arthropathy, unspecified: Secondary | ICD-10-CM | POA: Diagnosis present

## 2012-09-21 DIAGNOSIS — K089 Disorder of teeth and supporting structures, unspecified: Secondary | ICD-10-CM

## 2012-09-21 DIAGNOSIS — R109 Unspecified abdominal pain: Secondary | ICD-10-CM

## 2012-09-21 DIAGNOSIS — K029 Dental caries, unspecified: Secondary | ICD-10-CM

## 2012-09-21 DIAGNOSIS — G4733 Obstructive sleep apnea (adult) (pediatric): Secondary | ICD-10-CM | POA: Diagnosis present

## 2012-09-21 DIAGNOSIS — M8700 Idiopathic aseptic necrosis of unspecified bone: Secondary | ICD-10-CM

## 2012-09-21 DIAGNOSIS — G4730 Sleep apnea, unspecified: Secondary | ICD-10-CM | POA: Diagnosis present

## 2012-09-21 DIAGNOSIS — G4700 Insomnia, unspecified: Secondary | ICD-10-CM

## 2012-09-21 DIAGNOSIS — R0602 Shortness of breath: Secondary | ICD-10-CM | POA: Diagnosis present

## 2012-09-21 HISTORY — DX: Type 2 diabetes mellitus without complications: E11.9

## 2012-09-21 HISTORY — DX: Obesity, unspecified: E66.9

## 2012-09-21 LAB — POCT I-STAT, CHEM 8
BUN: 14 mg/dL (ref 6–23)
Calcium, Ion: 1.21 mmol/L (ref 1.12–1.23)
Chloride: 100 meq/L (ref 96–112)
Creatinine, Ser: 1.1 mg/dL (ref 0.50–1.35)
Glucose, Bld: 189 mg/dL — ABNORMAL HIGH (ref 70–99)
HCT: 46 % (ref 39.0–52.0)
Hemoglobin: 15.6 g/dL (ref 13.0–17.0)
Potassium: 3.5 meq/L (ref 3.5–5.1)
Sodium: 139 meq/L (ref 135–145)
TCO2: 25 mmol/L (ref 0–100)

## 2012-09-21 NOTE — ED Provider Notes (Signed)
CSN: [id number]     Arrival date & time 09/21/12  1930 History     None    Chief Complaint  Patient presents with  . Cough   (Consider location/radiation/quality/duration/timing/severity/associated sxs/prior Treatment) HPI Comments: 42 year old obese male with history of hypertension presents complaining of productive cough for one month. He is having sinus drainage as well. He has an appointment with his PCP next week to evaluate this but she wanted to come be checked out. He has been feeling slightly short of breath as well. He denies fever, chills, or pleuritic chest pain.  Patient is a 42 y.o. male presenting with cough.  Cough Associated symptoms: shortness of breath   Associated symptoms: no chest pain, no chills, no fever, no myalgias, no rash and no sore throat     Past Medical History  Diagnosis Date  . Sleep apnea   . Hypertension   . Arthritis   . Obesity   . Diabetes mellitus without complication    Past Surgical History  Procedure Laterality Date  . Hip surgery Bilateral    Family History  Problem Relation Age of Onset  . Cirrhosis Father   . Diabetes Other   . Diabetes Other    History  Substance Use Topics  . Smoking status: Current Every Day Smoker -- 0.00 packs/day    Types: Cigars  . Smokeless tobacco: Never Used  . Alcohol Use: Yes     Comment: occasional    Review of Systems  Constitutional: Negative for fever, chills and fatigue.  HENT: Positive for postnasal drip. Negative for sore throat, neck pain and neck stiffness.   Eyes: Negative for visual disturbance.  Respiratory: Positive for cough and shortness of breath.   Cardiovascular: Negative for chest pain, palpitations and leg swelling.  Gastrointestinal: Negative for nausea, vomiting, abdominal pain, diarrhea and constipation.  Genitourinary: Negative for dysuria, urgency, frequency and hematuria.  Musculoskeletal: Negative for myalgias and arthralgias.  Skin: Negative for rash.   Neurological: Negative for dizziness, weakness and light-headedness.    Allergies  Review of patient's allergies indicates no known allergies.  Home Medications   Current Outpatient Rx  Name  Route  Sig  Dispense  Refill  . calcium-vitamin D (OSCAL WITH D) 500-200 MG-UNIT per tablet   Oral   Take 1 tablet by mouth.         . sitaGLIPtin (JANUVIA) 25 MG tablet   Oral   Take 25 mg by mouth daily.         Marland Kitchen HYDROcodone-acetaminophen (NORCO/VICODIN) 5-325 MG per tablet   Oral   Take 1 tablet by mouth every 4 (four) hours as needed for pain.   10 tablet   0   . sucralfate (CARAFATE) 1 GM/10ML suspension   Oral   Take 10 mLs (1 g total) by mouth 4 (four) times daily.   420 mL   0    BP 153/85  Pulse 106  Temp(Src) 98.6 F (37 C) (Oral)  Resp 20  SpO2 94% Physical Exam  Nursing note and vitals reviewed. Constitutional: He is oriented to person, place, and time. He appears well-developed and well-nourished. No distress.  Morbidly obese body habitus   HENT:  Head: Normocephalic and atraumatic.  Cardiovascular:  Distant heart sounds, likely due to body habitus   Pulmonary/Chest: Tachypnea noted. He is in respiratory distress.  Auscultation is difficult due to body habitus   Neurological: He is alert and oriented to person, place, and time. Coordination normal.  Skin: Skin  is warm and dry. No rash noted. He is not diaphoretic.  Psychiatric: He has a normal mood and affect. Judgment normal.    ED Course   Procedures (including critical care time)  Labs Reviewed - No data to display Dg Chest 2 View  09/21/2012   *RADIOLOGY REPORT*  Clinical Data: Cough, history of diabetes  CHEST - 2 VIEW  Comparison: 08/28/2009  Findings:  Examination is degraded secondary to patient body habitus.  The lateral radiograph is further degraded secondary to patient motion artifact  Grossly unchanged enlarged cardiac silhouette and mediastinal contours given persistently reduced lung  volumes.  The pulmonary vasculature is indistinct with cephalization of flow.  Worsening perihilar and bilateral medial basilar heterogeneous opacities.  No definite pleural effusion or pneumothorax.  Unchanged bones.  IMPRESSION:  Findings of hypoventilation and suspected superimposed mild pulmonary edema, though note, atypical infection may have a similar appearance.  Clinical correlation is advised.   Original Report Authenticated By: Tacey Ruiz, MD   1. Cough   2. Hypoxemia     MDM  Case discussed with attending. Given the x-ray findings and abnormal vital signs, this patient  needs further workup, possibly diuresis tonight. Transferring to the emergency department  Graylon Good, PA-C 09/21/12 2112

## 2012-09-21 NOTE — ED Notes (Signed)
EMT states I-stat 8 was cancelled.

## 2012-09-21 NOTE — ED Notes (Signed)
PT. TRANSFERRED FROM [facility] - PT. REPORTS PERSISTENT PRODUCTIVE COUGH WITH EXERTIONAL DYSPNEA FOR 1 MONTH , X-RAY DONE AT URGENT CARE , DENIES CHEST PAIN / RESPIRATIONS UNLABORED AT ARRIVAL .  DENIES FEVER OR CHILLS.

## 2012-09-21 NOTE — ED Notes (Signed)
C/o coughing at night for 1 month, prod of yellow sputum. No chills or fever.  Stomach and sides aching from coughing.  Sinus drainage at night.  Uses C-pap at night for sleep apnea.  Has appt with PCP next week.

## 2012-09-21 NOTE — ED Notes (Addendum)
NURSE FIRST: patient transferred from [facility] via shuttle for c/o COUGH & LOW SPO2 (94% on RA). CXR showed pulmonary edema. VS: 153/85 HR 106 RR 20 Temp 98.6. Patient ambulatory into ER. AAOx4. NAD noted. Report phoned from [place], [place]

## 2012-09-21 NOTE — ED Notes (Signed)
Pt reports a cough for past month. States that it's productive at night. States he coughs up green-yellow sputum. Reports abdominal muscle wall sore from coughing.

## 2012-09-22 ENCOUNTER — Emergency Department (HOSPITAL_COMMUNITY): Payer: Medicare Other

## 2012-09-22 DIAGNOSIS — E669 Obesity, unspecified: Secondary | ICD-10-CM

## 2012-09-22 DIAGNOSIS — R0602 Shortness of breath: Secondary | ICD-10-CM

## 2012-09-22 DIAGNOSIS — I10 Essential (primary) hypertension: Secondary | ICD-10-CM

## 2012-09-22 DIAGNOSIS — E119 Type 2 diabetes mellitus without complications: Secondary | ICD-10-CM

## 2012-09-22 DIAGNOSIS — I2699 Other pulmonary embolism without acute cor pulmonale: Principal | ICD-10-CM

## 2012-09-22 DIAGNOSIS — K219 Gastro-esophageal reflux disease without esophagitis: Secondary | ICD-10-CM

## 2012-09-22 LAB — CBC
HCT: 41.6 % (ref 39.0–52.0)
HCT: 42.6 % (ref 39.0–52.0)
Hemoglobin: 14.3 g/dL (ref 13.0–17.0)
Hemoglobin: 14.4 g/dL (ref 13.0–17.0)
MCH: 29.9 pg (ref 26.0–34.0)
MCH: 30.5 pg (ref 26.0–34.0)
MCHC: 33.8 g/dL (ref 30.0–36.0)
MCHC: 34.4 g/dL (ref 30.0–36.0)
MCV: 88.6 fL (ref 78.0–100.0)
MCV: 88.7 fL (ref 78.0–100.0)
Platelets: 256 10*3/uL (ref 150–400)
Platelets: 267 10*3/uL (ref 150–400)
RBC: 4.69 MIL/uL (ref 4.22–5.81)
RBC: 4.81 MIL/uL (ref 4.22–5.81)
RDW: 12.9 % (ref 11.5–15.5)
RDW: 13.1 % (ref 11.5–15.5)
WBC: 6.9 10*3/uL (ref 4.0–10.5)
WBC: 7.3 10*3/uL (ref 4.0–10.5)

## 2012-09-22 LAB — GLUCOSE, CAPILLARY
Glucose-Capillary: 108 mg/dL — ABNORMAL HIGH (ref 70–99)
Glucose-Capillary: 169 mg/dL — ABNORMAL HIGH (ref 70–99)
Glucose-Capillary: 178 mg/dL — ABNORMAL HIGH (ref 70–99)
Glucose-Capillary: 132 mg/dL — ABNORMAL HIGH (ref 70–99)
Glucose-Capillary: 101 mg/dL — ABNORMAL HIGH (ref 70–99)

## 2012-09-22 LAB — BASIC METABOLIC PANEL
BUN: 14 mg/dL (ref 6–23)
CO2: 27 mEq/L (ref 19–32)
Calcium: 9.3 mg/dL (ref 8.4–10.5)
Chloride: 102 mEq/L (ref 96–112)
Creatinine, Ser: 1.03 mg/dL (ref 0.50–1.35)
GFR calc Af Amer: >90 mL/min (ref >90)
GFR calc non Af Amer: 87 mL/min — ABNORMAL LOW (ref >90)
Glucose, Bld: 154 mg/dL — ABNORMAL HIGH (ref 70–99)
Potassium: 3.9 mEq/L (ref 3.5–5.1)
Sodium: 139 mEq/L (ref 135–145)

## 2012-09-22 LAB — MRSA PCR SCREENING: MRSA by PCR: NEGATIVE

## 2012-09-22 LAB — PROTIME-INR
INR: 1.01 (ref 0.00–1.49)
Prothrombin Time: 13.1 seconds (ref 11.6–15.2)

## 2012-09-22 LAB — TROPONIN I: Troponin I: <0.3 ng/mL (ref <0.30)

## 2012-09-22 LAB — HEPARIN LEVEL (UNFRACTIONATED): Heparin Unfractionated: <0.1 IU/mL — ABNORMAL LOW (ref 0.30–0.70)

## 2012-09-22 MED ORDER — SODIUM CHLORIDE 0.9 % IJ SOLN: 3.0000 mL | INTRAMUSCULAR | Status: DC | PRN

## 2012-09-22 MED ORDER — RIVAROXABAN 15 MG PO TABS
15.0000 mg | ORAL_TABLET | Freq: Two times a day (BID) | ORAL | Status: DC
  Filled 2012-09-22: qty 1

## 2012-09-22 MED ORDER — SODIUM CHLORIDE 0.9 % IJ SOLN
3.0000 mL | Freq: Two times a day (BID) | INTRAMUSCULAR | Status: DC
  Administered 2012-09-22 – 2012-09-23 (×3): 3 mL via INTRAVENOUS

## 2012-09-22 MED ORDER — HEPARIN BOLUS VIA INFUSION
5000.0000 [IU] | Freq: Once | INTRAVENOUS | Status: AC
  Administered 2012-09-22: 5000 [IU] via INTRAVENOUS

## 2012-09-22 MED ORDER — IOHEXOL 350 MG/ML SOLN
100.0000 mL | Freq: Once | INTRAVENOUS | Status: AC | PRN
  Administered 2012-09-22: 100 mL via INTRAVENOUS

## 2012-09-22 MED ORDER — SODIUM CHLORIDE 0.9 % IV SOLN: 250.0000 mL | INTRAVENOUS | Status: DC | PRN

## 2012-09-22 MED ORDER — HEPARIN BOLUS VIA INFUSION
4000.0000 [IU] | Freq: Once | INTRAVENOUS | Status: AC
  Administered 2012-09-22: 4000 [IU] via INTRAVENOUS
  Filled 2012-09-22: qty 4000

## 2012-09-22 MED ORDER — HEPARIN SODIUM (PORCINE) 5000 UNIT/ML IJ SOLN: 60.0000 [IU]/kg | Freq: Once | INTRAMUSCULAR | Status: DC

## 2012-09-22 MED ORDER — RIVAROXABAN 20 MG PO TABS: 20.0000 mg | ORAL_TABLET | Freq: Every day | ORAL | Status: DC

## 2012-09-22 MED ORDER — INSULIN ASPART 100 UNIT/ML ~~LOC~~ SOLN: 0.0000 [IU] | Freq: Every day | SUBCUTANEOUS | Status: DC

## 2012-09-22 MED ORDER — INSULIN ASPART 100 UNIT/ML ~~LOC~~ SOLN
0.0000 [IU] | Freq: Three times a day (TID) | SUBCUTANEOUS | Status: DC
  Administered 2012-09-22: 1 [IU] via SUBCUTANEOUS
  Administered 2012-09-22 – 2012-09-23 (×3): 2 [IU] via SUBCUTANEOUS

## 2012-09-22 MED ORDER — LINAGLIPTIN 5 MG PO TABS
5.0000 mg | ORAL_TABLET | Freq: Every day | ORAL | Status: DC
  Administered 2012-09-22 – 2012-09-23 (×2): 5 mg via ORAL
  Filled 2012-09-22 (×2): qty 1

## 2012-09-22 MED ORDER — FLUTICASONE PROPIONATE 50 MCG/ACT NA SUSP
1.0000 | Freq: Every day | NASAL | Status: DC
  Administered 2012-09-22 – 2012-09-23 (×2): 1 via NASAL
  Filled 2012-09-22: qty 16

## 2012-09-22 MED ORDER — NON FORMULARY: 25.0000 mg | Freq: Every day | Status: DC

## 2012-09-22 MED ORDER — PNEUMOCOCCAL VAC POLYVALENT 25 MCG/0.5ML IJ INJ
0.5000 mL | INJECTION | INTRAMUSCULAR | Status: AC
  Administered 2012-09-23: 0.5 mL via INTRAMUSCULAR
  Filled 2012-09-22: qty 0.5

## 2012-09-22 MED ORDER — HEPARIN (PORCINE) IN NACL 100-0.45 UNIT/ML-% IJ SOLN: 1000.0000 [IU]/h | INTRAMUSCULAR | Status: DC

## 2012-09-22 MED ORDER — RIVAROXABAN 15 MG PO TABS
15.0000 mg | ORAL_TABLET | Freq: Two times a day (BID) | ORAL | Status: DC
  Administered 2012-09-22 – 2012-09-23 (×2): 15 mg via ORAL
  Filled 2012-09-22 (×3): qty 1

## 2012-09-22 MED ORDER — SODIUM CHLORIDE 0.9 % IV SOLN
INTRAVENOUS | Status: DC
  Administered 2012-09-22: 1000 mL via INTRAVENOUS
  Administered 2012-09-22: 04:00:00 via INTRAVENOUS

## 2012-09-22 MED ORDER — HEPARIN (PORCINE) IN NACL 100-0.45 UNIT/ML-% IJ SOLN
2450.0000 [IU]/h | INTRAMUSCULAR | Status: DC
  Administered 2012-09-22: 2000 [IU]/h via INTRAVENOUS
  Administered 2012-09-22: 2450 [IU]/h via INTRAVENOUS
  Filled 2012-09-22 (×4): qty 250

## 2012-09-22 NOTE — Progress Notes (Signed)
Pt transferred to 5N04 per MD order. Report called to receiving nurse and all questions answered.

## 2012-09-22 NOTE — Progress Notes (Addendum)
ANTICOAGULATION CONSULT NOTE - Follow Up Consult  Pharmacy Consult for Heparin Indication: pulmonary embolus  No Known Allergies  Patient Measurements: Height: 6\' 3"  (190.5 cm) Weight: 376 lb 8.7 oz (170.8 kg) IBW/kg (Calculated) : 84.5 Heparin Dosing Weight: 120 kg  Vital Signs: Temp: 97.8 F (36.6 C) (08/12 1136) Temp src: Oral (08/12 1136) BP: 120/48 mmHg (08/12 0725) Pulse Rate: 80 (08/12 0725)  Labs:  Recent Labs  09/21/12 2342 09/21/12 2354 09/22/12 0528 09/22/12 0533 09/22/12 1010  HGB 14.4 15.6  --  14.3  --   HCT 42.6 46.0  --  41.6  --   PLT 256  --   --  267  --   LABPROT  --   --   --  13.1  --   INR  --   --   --  1.01  --   HEPARINUNFRC  --   --   --   --  <0.10*  CREATININE  --  1.10  --  1.03  --   TROPONINI  --   --  <0.30  --   --     Estimated Creatinine Clearance: 155.6 ml/min (by C-G formula based on Cr of 1.03).   Medications:  Infusions:  . heparin 2,000 Units/hr (09/22/12 1000)    Assessment: 42 y/o male on a heparin drip for bilateral PE confirmed by CT angio chest. Heparin level is subtherapeutic at <0.1 on 2000 units/hr. Spoke with RN and no problems with infusion. No bleeding noted, CBC is stable.   Goal of Therapy:  Heparin level 0.3-0.7 units/ml Monitor platelets by anticoagulation protocol: Yes   Plan:  -Heparin 4000 units IV bolus then increase rate to 2450 units/hr -Heparin level 6 hours after rate change -Daily heparin level and CBC -Monitor for signs/symptoms of bleeding -Follow-up plans for long-term anticoagulation  [facility], Pharm.D., BCPS Clinical Pharmacist Pager: [phone] 09/22/2012 12:00 PM

## 2012-09-22 NOTE — Progress Notes (Signed)
Utilization review completed.  

## 2012-09-22 NOTE — H&P (Signed)
PCP:   Alva Garnet., MD   Chief Complaint:  sob  HPI: 42 yo male h/o dm/osa presented to urgent care with progressive worsening sob for about a month.  He has been coughing a lot also and has been nonbloody.  Denies any fevers.  No le edema or swelling or calf pain.  No recent trauma, no recent surgery, no recent traveling.  No recent illnesses.  Did start some new diet pills he purchased OTC as he is trying to loose weight.  No other new medications.  No bleeding issues.  No cp.  Has osa and using cpap on regular basis.  Was sent here from urgent care for mild hypoxia.  Found to have multiple bilateral pulmonary emboli and emergent CT scan.  Review of Systems:  Positive and negative as per HPI otherwise all other systems are negative  Past Medical History: Past Medical History  Diagnosis Date  . Sleep apnea   . Hypertension   . Arthritis   . Obesity   . Diabetes mellitus without complication    Past Surgical History  Procedure Laterality Date  . Hip surgery Bilateral     Medications: Prior to Admission medications   Medication Sig Start Date End Date Taking? Authorizing Provider  calcium-vitamin D (OSCAL WITH D) 500-200 MG-UNIT per tablet Take 1 tablet by mouth daily.    Yes Historical Provider, MD  sitaGLIPtin (JANUVIA) 25 MG tablet Take 25 mg by mouth daily.   Yes Historical Provider, MD    Allergies:  No Known Allergies  Social History:  reports that he has been smoking Cigars.  He has never used smokeless tobacco. He reports that  drinks alcohol. He reports that he does not use illicit drugs.  Family History: Family History  Problem Relation Age of Onset  . Cirrhosis Father   . Diabetes Other   . Diabetes Other     Physical Exam: Filed Vitals:   09/22/12 0115 09/22/12 0130 09/22/12 0207 09/22/12 0230  BP: 125/71 136/79 114/70 137/81  Pulse: 87 74  80  Temp:      TempSrc:      Resp: 24 23 20 24   SpO2: 96% 94% 97% 98%   General appearance: alert,  cooperative and no distress Head: Normocephalic, without obvious abnormality, atraumatic Eyes: negative Neck: no JVD and supple, symmetrical, trachea midline Lungs: clear to auscultation bilaterally Heart: regular rate and rhythm, S1, S2 normal, no murmur, click, rub or gallop Abdomen: soft, non-tender; bowel sounds normal; no masses,  no organomegaly Extremities: extremities normal, atraumatic, no cyanosis or edema Pulses: 2+ and symmetric Skin: Skin color, texture, turgor normal. No rashes or lesions Neurologic: Grossly normal   Labs on Admission:   Recent Labs  09/21/12 2354  NA 139  K 3.5  CL 100  GLUCOSE 189*  BUN 14  CREATININE 1.10    Recent Labs  09/21/12 2342 09/21/12 2354  WBC 6.9  --   HGB 14.4 15.6  HCT 42.6 46.0  MCV 88.6  --   PLT 256  --    Radiological Exams on Admission: Dg Chest 2 View  09/21/2012   *RADIOLOGY REPORT*  Clinical Data: Cough, history of diabetes  CHEST - 2 VIEW  Comparison: 08/28/2009  Findings:  Examination is degraded secondary to patient body habitus.  The lateral radiograph is further degraded secondary to patient motion artifact  Grossly unchanged enlarged cardiac silhouette and mediastinal contours given persistently reduced lung volumes.  The pulmonary vasculature is indistinct with cephalization of  flow.  Worsening perihilar and bilateral medial basilar heterogeneous opacities.  No definite pleural effusion or pneumothorax.  Unchanged bones.  IMPRESSION:  Findings of hypoventilation and suspected superimposed mild pulmonary edema, though note, atypical infection may have a similar appearance.  Clinical correlation is advised.   Original Report Authenticated By: Tacey Ruiz, MD   Ct Angio Chest Pe W/cm &/or Wo Cm  09/22/2012   *RADIOLOGY REPORT*  Clinical Data: Shortness of breath.  Cough.  CT ANGIOGRAPHY CHEST  Technique:  Multidetector CT imaging of the chest using the standard protocol during bolus administration of intravenous  contrast. Multiplanar reconstructed images including MIPs were obtained and reviewed to evaluate the vascular anatomy.  Contrast: OMNIPAQUE IOHEXOL 350 MG/ML SOLN  Comparison: Chest CT 03/05/2006.  Findings:  Mediastinum: Study is limited by considerable respiratory motion. Despite these limitations, there appear to be multiple segmental and subsegmental sized filling defects in the pulmonary arteries of the lower lobes of the lungs bilaterally, compatible with pulmonary embolism.  No larger central or lobar sized filling defect is noted. Heart size is mildly enlarged. There is no significant pericardial fluid, thickening or pericardial calcification. No pathologically enlarged mediastinal or hilar lymph nodes. Esophagus is unremarkable in appearance.  Lungs/Pleura: No acute consolidative airspace disease.  No pleural effusions.  No definite suspicious appearing pulmonary nodules or masses are identified at this time.  Upper Abdomen: Unremarkable.  Musculoskeletal: There are no aggressive appearing lytic or blastic lesions noted in the visualized portions of the skeleton.  IMPRESSION: 1.  Although the examination is slightly limited, the study is positive for segmental and subsegmental sized pulmonary emboli in the lower lobes of the lungs bilaterally. 2.  Mild cardiomegaly.  Critical Value/emergent results were called by telephone at the time of interpretation on 09/22/2012 at 03:03 AM to Dr. Dierdre Highman, who verbally acknowledged these results.   Original Report Authenticated By: Trudie Reed, M.D.    Assessment/Plan  42 yo male with sob x one month with new bilateral pulmonary emboli seems unprovoked  Principal Problem:   Pulmonary embolism, bilateral Active Problems:   HYPERTENSION   SLEEP APNEA   Obesity   Diabetes mellitus without complication   SOB (shortness of breath)  Place on heparin gtt after iv load in ED.  Place in stepdown for close observation, ck inr.  Would be a good xaralto  candidate if can afford.  Full code.  Phillip Frye A 09/22/2012, 3:26 AM

## 2012-09-22 NOTE — Progress Notes (Signed)
VASCULAR LAB PRELIMINARY  PRELIMINARY  PRELIMINARY  PRELIMINARY  Bilateral lower extremity venous duplex  completed.    Preliminary report:  Bilateral:  No evidence of DVT, superficial thrombosis, or Baker's Cyst.    Azara Gemme, RVT 09/22/2012, 5:01 PM

## 2012-09-22 NOTE — Progress Notes (Signed)
Pt c/o of being hungry and not getting enough to eat. Called MD received order that pt is cleared to have double portions of protein with meals.

## 2012-09-22 NOTE — ED Notes (Signed)
Patient transported to CT 

## 2012-09-22 NOTE — Care Management Note (Signed)
Received referral for Xarelto- per benefits check- xarelto is covered as a tier 3, pre-auth is required ph# [phone] select  op 2, (co-pay at retial per prescription is $45.00)

## 2012-09-22 NOTE — Progress Notes (Signed)
Pt admitted to room 3S09 with belongings. VSS. He is on a heparin gtt. ELINK and CCMD notified.

## 2012-09-22 NOTE — ED Notes (Signed)
Pt alert, NAD, calm, interactive, skin W&D, resps e/u, speaking in clear complete sentences, denies pain, resting comfortably supine HOB 20 degrees, EDP in to see pt, admitting MD at [facility]. Pt updated, questions answered.

## 2012-09-22 NOTE — Progress Notes (Signed)
[facility] Progress Note [place] TEAM 1 - Stepdown/ICU TEAM   Phillip Frye WUJ:[id number] DOB: Feb 29, 1970 DOA: 09/21/2012 PCP: Alva Garnet., MD  Brief narrative: 42 yo male h/o dm/osa presented to urgent care with progressive worsening sob and a cough for about a month.  Was sent here from urgent care for mild hypoxia. Denies any fevers. No le edema or swelling or calf pain. No recent trauma, no recent surgery, no recent traveling. No recent illnesses. Did start some new diet pills he purchased OTC as he is trying to loose weight. No other new medications. No bleeding issues. No cp. Has osa and using cpap on regular basis. . Found to have multiple bilateral pulmonary emboli and emergent CT scan.   Assessment/Plan: Principal Problem:   Pulmonary embolism, bilateral - start Xarelto - Home O2 desat screening - dopplers of legs - hyper-coag w/u (partly) ordered  Active Problems:  Cough - stemming from sinus congestion - start Flonase     HYPERTENSION States he was taken off of medications for this    SLEEP APNEA CPAP at night    Obesity- Morbid    Diabetes mellitus without complication - cont Januvia and sliding scale      Code Status: full code Family Communication: none Disposition Plan: transfer to med/surg  Consultants: none  Procedures: none  Antibiotics: none  DVT prophylaxis: Heparin  HPI/Subjective: Pt c/o cough and asking for something for it. Chest pain only when coughing.    Objective: Blood pressure 134/85, pulse 71, temperature 97.8 F (36.6 C), temperature source Oral, resp. rate 16, height 6\' 3"  (1.905 m), weight 170.8 kg (376 lb 8.7 oz), SpO2 99.00%.  Intake/Output Summary (Last 24 hours) at 09/22/12 1613 Last data filed at 09/22/12 1400  Gross per 24 hour  Intake 3258.5 ml  Output   1200 ml  Net 2058.5 ml     Exam: General: No acute respiratory distress- morbidly obese Lungs: Clear to auscultation bilaterally  without wheezes or crackles- can here sinus congestion Cardiovascular: Regular rate and rhythm without murmur gallop or rub normal S1 and S2 Abdomen: Nontender, nondistended, soft, bowel sounds positive, no rebound, no ascites, no appreciable mass Extremities: No significant cyanosis, clubbing, or edema bilateral lower extremities  Data Reviewed: Basic Metabolic Panel:  Recent Labs Lab 09/21/12 2354 09/22/12 0533  NA 139 139  K 3.5 3.9  CL 100 102  CO2  --  27  GLUCOSE 189* 154*  BUN 14 14  CREATININE 1.10 1.03  CALCIUM  --  9.3   Liver Function Tests: No results found for this basename: AST, ALT, ALKPHOS, BILITOT, PROT, ALBUMIN,  in the last 168 hours No results found for this basename: LIPASE, AMYLASE,  in the last 168 hours No results found for this basename: AMMONIA,  in the last 168 hours CBC:  Recent Labs Lab 09/21/12 2342 09/21/12 2354 09/22/12 0533  WBC 6.9  --  7.3  HGB 14.4 15.6 14.3  HCT 42.6 46.0 41.6  MCV 88.6  --  88.7  PLT 256  --  267   Cardiac Enzymes:  Recent Labs Lab 09/22/12 0528  TROPONINI <0.30   BNP (last 3 results) No results found for this basename: PROBNP,  in the last 8760 hours CBG:  Recent Labs Lab 09/22/12 0509 09/22/12 0737 09/22/12 1134  GLUCAP 108* 169* 178*    Recent Results (from the past 240 hour(s))  MRSA PCR SCREENING     Status: None   Collection Time  09/22/12  4:37 AM      Result Value Range Status   MRSA by PCR NEGATIVE  NEGATIVE Final   Comment:            The GeneXpert MRSA Assay (FDA     approved for NASAL specimens     only), is one component of a     comprehensive MRSA colonization     surveillance program. It is not     intended to diagnose MRSA     infection nor to guide or     monitor treatment for     MRSA infections.     Studies:  Recent x-ray studies have been reviewed in detail by the Attending Physician  Scheduled Meds:  Scheduled Meds: . fluticasone  1 spray Each Nare Daily  .  insulin aspart  0-5 Units Subcutaneous QHS  . insulin aspart  0-9 Units Subcutaneous TID WC  . linagliptin  5 mg Oral Daily  . [START ON 09/23/2012] pneumococcal 23 valent vaccine  0.5 mL Intramuscular Tomorrow-1000  . sodium chloride  3 mL Intravenous Q12H   Continuous Infusions: . heparin 2,450 Units/hr (09/22/12 1400)    Time spent on care of this patient: 35 min   Kyrstin Campillo, MD  [facility] Office  [phone] Pager - Text Page per Loretha Stapler as per below:  On-Call/Text Page:      [email or web address]      password [email or web address]  If 7PM-7AM, please contact night-coverage [email or web address] Password [facility] 09/22/2012, 4:13 PM   LOS: 1 day

## 2012-09-22 NOTE — ED Provider Notes (Signed)
CSN: [id number]     Arrival date & time 09/21/12  2118 History     First MD Initiated Contact with Patient 09/21/12 2340     Chief Complaint  Patient presents with  . Cough   (Consider location/radiation/quality/duration/timing/severity/associated sxs/prior Treatment) HPI History provided by patient. Cough and dyspnea for the last month progressively worsening tonight. He went to the urgent care Center and had a chest x-ray and was sent here for borderline hypoxia and dyspnea without clear diagnosis. No chest pain. Has dry cough. No hemoptysis. Symptoms worse with exertion. No leg pain or leg swelling. No history of DVT or PE. Symptoms moderate to severe  Past Medical History  Diagnosis Date  . Sleep apnea   . Hypertension   . Arthritis   . Obesity   . Diabetes mellitus without complication    Past Surgical History  Procedure Laterality Date  . Hip surgery Bilateral    Family History  Problem Relation Age of Onset  . Cirrhosis Father   . Diabetes Other   . Diabetes Other    History  Substance Use Topics  . Smoking status: Current Every Day Smoker -- 0.00 packs/day    Types: Cigars  . Smokeless tobacco: Never Used  . Alcohol Use: Yes     Comment: occasional    Review of Systems  Constitutional: Negative for fever and chills.  HENT: Negative for neck pain.   Eyes: Negative for visual disturbance.  Respiratory: Positive for cough and shortness of breath.   Cardiovascular: Negative for chest pain.  Gastrointestinal: Negative for abdominal pain.  Genitourinary: Negative for dysuria.  Musculoskeletal: Negative for back pain.  Skin: Negative for rash.  Neurological: Negative for headaches.  All other systems reviewed and are negative.    Allergies  Review of patient's allergies indicates no known allergies.  Home Medications   Current Outpatient Rx  Name  Route  Sig  Dispense  Refill  . calcium-vitamin D (OSCAL WITH D) 500-200 MG-UNIT per tablet   Oral   Take  1 tablet by mouth daily.          . sitaGLIPtin (JANUVIA) 25 MG tablet   Oral   Take 25 mg by mouth daily.          BP 140/86  Pulse 96  Temp(Src) 98.2 F (36.8 C) (Oral)  Resp 22  SpO2 95% Physical Exam  Constitutional: He is oriented to person, place, and time. He appears well-developed and well-nourished.  HENT:  Head: Normocephalic and atraumatic.  Eyes: EOM are normal. Pupils are equal, round, and reactive to light.  Neck: Neck supple.  Cardiovascular: Regular rhythm and intact distal pulses.   Borderline tachycardia heart rate 90 to 110s  Pulmonary/Chest: Effort normal. No respiratory distress.  Mild tachypnea worse with sitting up or exertion, limited exam due to obesity with decreased bilateral breath sounds  Abdominal: Soft. He exhibits no distension. There is no tenderness.  Musculoskeletal: Normal range of motion. He exhibits no edema.  No calf tenderness or erythema  Neurological: He is alert and oriented to person, place, and time.  Skin: Skin is warm and dry.    ED Course   Procedures (including critical care time)  Results for orders placed during the hospital encounter of 09/21/12  CBC      Result Value Range   WBC 6.9  4.0 - 10.5 K/uL   RBC 4.81  4.22 - 5.81 MIL/uL   Hemoglobin 14.4  13.0 - 17.0 g/dL   HCT  42.6  39.0 - 52.0 %   MCV 88.6  78.0 - 100.0 fL   MCH 29.9  26.0 - 34.0 pg   MCHC 33.8  30.0 - 36.0 g/dL   RDW 95.2  84.1 - 32.4 %   Platelets 256  150 - 400 K/uL  POCT I-STAT, CHEM 8      Result Value Range   Sodium 139  135 - 145 mEq/L   Potassium 3.5  3.5 - 5.1 mEq/L   Chloride 100  96 - 112 mEq/L   BUN 14  6 - 23 mg/dL   Creatinine, Ser 4.01  0.50 - 1.35 mg/dL   Glucose, Bld 027 (*) 70 - 99 mg/dL   Calcium, Ion 2.53  6.64 - 1.23 mmol/L   TCO2 25  0 - 100 mmol/L   Hemoglobin 15.6  13.0 - 17.0 g/dL   HCT 40.3  47.4 - 25.9 %   Dg Chest 2 View  09/21/2012   *RADIOLOGY REPORT*  Clinical Data: Cough, history of diabetes  CHEST - 2 VIEW   Comparison: 08/28/2009  Findings:  Examination is degraded secondary to patient body habitus.  The lateral radiograph is further degraded secondary to patient motion artifact  Grossly unchanged enlarged cardiac silhouette and mediastinal contours given persistently reduced lung volumes.  The pulmonary vasculature is indistinct with cephalization of flow.  Worsening perihilar and bilateral medial basilar heterogeneous opacities.  No definite pleural effusion or pneumothorax.  Unchanged bones.  IMPRESSION:  Findings of hypoventilation and suspected superimposed mild pulmonary edema, though note, atypical infection may have a similar appearance.  Clinical correlation is advised.   Original Report Authenticated By: Tacey Ruiz, MD   Ct Angio Chest Pe W/cm &/or Wo Cm  09/22/2012   *RADIOLOGY REPORT*  Clinical Data: Shortness of breath.  Cough.  CT ANGIOGRAPHY CHEST  Technique:  Multidetector CT imaging of the chest using the standard protocol during bolus administration of intravenous contrast. Multiplanar reconstructed images including MIPs were obtained and reviewed to evaluate the vascular anatomy.  Contrast: OMNIPAQUE IOHEXOL 350 MG/ML SOLN  Comparison: Chest CT 03/05/2006.  Findings:  Mediastinum: Study is limited by considerable respiratory motion. Despite these limitations, there appear to be multiple segmental and subsegmental sized filling defects in the pulmonary arteries of the lower lobes of the lungs bilaterally, compatible with pulmonary embolism.  No larger central or lobar sized filling defect is noted. Heart size is mildly enlarged. There is no significant pericardial fluid, thickening or pericardial calcification. No pathologically enlarged mediastinal or hilar lymph nodes. Esophagus is unremarkable in appearance.  Lungs/Pleura: No acute consolidative airspace disease.  No pleural effusions.  No definite suspicious appearing pulmonary nodules or masses are identified at this time.  Upper  Abdomen: Unremarkable.  Musculoskeletal: There are no aggressive appearing lytic or blastic lesions noted in the visualized portions of the skeleton.  IMPRESSION: 1.  Although the examination is slightly limited, the study is positive for segmental and subsegmental sized pulmonary emboli in the lower lobes of the lungs bilaterally. 2.  Mild cardiomegaly.  Critical Value/emergent results were called by telephone at the time of interpretation on 09/22/2012 at 03:03 AM to Dr. Dierdre Highman, who verbally acknowledged these results.   Original Report Authenticated By: Trudie Reed, M.D.     Date: 09/22/2012  Rate: 91   Rhythm: normal sinus rhythm  QRS Axis: normal  Intervals: normal  ST/T Wave abnormalities: nonspecific ST/T changes  Conduction Disutrbances:none  Narrative Interpretation:   Old EKG Reviewed: none available  IV heparin 3:08 AM and medicine consult requested for admission  MDM  Cough dyspnea and borderline hypoxia with PE on CT scan reviewed as above. EKG. Labs. Imaging. Medications provided Medical admission   Sunnie Nielsen, MD 09/22/12 [phone]

## 2012-09-22 NOTE — Progress Notes (Signed)
ANTICOAGULATION CONSULT NOTE - Initial Consult  Pharmacy Consult for Heparin Indication: pulmonary embolus  No Known Allergies  Patient Measurements: Height: 6' 2.02" (188 cm) Weight: 381 lb (172.82 kg) IBW/kg (Calculated) : 82.24 Heparin Dosing Weight: 120 kg   Vital Signs: Temp: 98.2 F (36.8 C) (08/11 2126) Temp src: Oral (08/11 2126) BP: 137/81 mmHg (08/12 0230) Pulse Rate: 80 (08/12 0230)  Labs:  Recent Labs  09/21/12 2342 09/21/12 2354  HGB 14.4 15.6  HCT 42.6 46.0  PLT 256  --   CREATININE  --  1.10    Estimated Creatinine Clearance: 145 ml/min (by C-G formula based on Cr of 1.1).   Medical History: Past Medical History  Diagnosis Date  . Sleep apnea   . Hypertension   . Arthritis   . Obesity   . Diabetes mellitus without complication     Medications:  Januvia  Oscal-D  Assessment: 42 yo male with PE for heparin  Goal of Therapy:  Heparin level 0.3-0.7 units/ml Monitor platelets by anticoagulation protocol: Yes   Plan:  Heparin 5000 units IV bolus, then 2000 units/hr Check heparin level in 6 hours.  Eddie Candle 09/22/2012,3:27 AM

## 2012-09-23 DIAGNOSIS — R109 Unspecified abdominal pain: Secondary | ICD-10-CM

## 2012-09-23 LAB — GLUCOSE, CAPILLARY
Glucose-Capillary: 155 mg/dL — ABNORMAL HIGH (ref 70–99)
Glucose-Capillary: 138 mg/dL — ABNORMAL HIGH (ref 70–99)

## 2012-09-23 LAB — LUPUS ANTICOAGULANT PANEL
DRVVT: 27.7 secs (ref <42.9)
Lupus Anticoagulant: NOT DETECTED
PTT Lupus Anticoagulant: 36.3 secs (ref 28.0–43.0)

## 2012-09-23 LAB — FACTOR 5 LEIDEN

## 2012-09-23 LAB — PROTHROMBIN GENE MUTATION

## 2012-09-23 MED ORDER — FLUTICASONE PROPIONATE 50 MCG/ACT NA SUSP: 1.0000 | Freq: Every day | NASAL | Status: DC

## 2012-09-23 MED ORDER — NICOTINE 21 MG/24HR TD PT24: 1.0000 | MEDICATED_PATCH | Freq: Every day | TRANSDERMAL | Status: DC

## 2012-09-23 MED ORDER — RIVAROXABAN 15 MG PO TABS: 15.0000 mg | ORAL_TABLET | Freq: Two times a day (BID) | ORAL | Status: DC

## 2012-09-23 MED ORDER — HYDROCOD POLST-CHLORPHEN POLST 10-8 MG/5ML PO LQCR: 5.0000 mL | Freq: Two times a day (BID) | ORAL | Status: DC | PRN

## 2012-09-23 MED ORDER — NICOTINE 21 MG/24HR TD PT24
21.0000 mg | MEDICATED_PATCH | Freq: Every day | TRANSDERMAL | Status: DC
  Administered 2012-09-23: 21 mg via TRANSDERMAL
  Filled 2012-09-23: qty 1

## 2012-09-23 MED ORDER — HYDROCOD POLST-CHLORPHEN POLST 10-8 MG/5ML PO LQCR
5.0000 mL | Freq: Two times a day (BID) | ORAL | Status: DC | PRN
  Administered 2012-09-23: 5 mL via ORAL
  Filled 2012-09-23: qty 5

## 2012-09-23 MED ORDER — BENZONATATE 100 MG PO CAPS
100.0000 mg | ORAL_CAPSULE | Freq: Three times a day (TID) | ORAL | Status: DC
  Administered 2012-09-23: 100 mg via ORAL
  Filled 2012-09-23 (×3): qty 1

## 2012-09-23 MED ORDER — BENZONATATE 100 MG PO CAPS: 100.0000 mg | ORAL_CAPSULE | Freq: Three times a day (TID) | ORAL | Status: DC

## 2012-09-23 MED ORDER — RIVAROXABAN 20 MG PO TABS: 20.0000 mg | ORAL_TABLET | Freq: Every day | ORAL | Status: DC

## 2012-09-23 NOTE — Progress Notes (Signed)
09/23/12 Contacted [facility] to obtain auth for xarelto. Able to get an auth through 10/28/12, ref # [id number]. Received fax of auth. Informed Dr. Isidoro Donning of [facility], she contacted [facility], they were not able to extend the auth period.Dr. Isidoro Donning contacted patient's PCP Dr. Dorothyann Peng and informed Dr. Allyne Gee that patient's xarelto [facility] Harvey is until 10/28/12. Dr. Allyne Gee stated that patient should make appt for f/u with her in 10days and she will handle getting further auth and can cover with samples if needed. I explained to patient importance of f/u with Dr. Allyne Gee and need for additional auth. I gave him a copy of the auth from [employer]. Informed patient of 45$ copay and that he will need to have his rx for xarelto in addition to the authorization.Patient stated that he understood and will f/u with Dr. Allyne Gee.  Jacquelynn Cree RN, BSN, CCM

## 2012-09-23 NOTE — Discharge Summary (Signed)
Physician Discharge Summary  Patient ID: Phillip Frye MRN: [id number] DOB/AGE: 1970-12-17 42 y.o.  Admit date: 09/21/2012 Discharge date: 09/23/2012  Primary Care Physician:  Gwynneth Aliment, MD  Discharge Diagnoses:   . Pulmonary embolism, bilateral . Diabetes mellitus without complication . HYPERTENSION . Obesity . SLEEP APNEA . SOB (shortness of breath) . Pulmonary embolism, bilateral  Consults: None   Recommendations for Outpatient Follow-up:  1. As per my phone conversation with you today, please have the patient preauthorized for xarelto 20 mg daily or samples. He is currently preauthorized for xarelto 15 mg BID through 11/07/12.     Allergies:  No Known Allergies   Discharge Medications:   Medication List         benzonatate 100 MG capsule  Commonly known as:  TESSALON  Take 1 capsule (100 mg total) by mouth 3 (three) times daily.     calcium-vitamin D 500-200 MG-UNIT per tablet  Commonly known as:  OSCAL WITH D  Take 1 tablet by mouth daily.     chlorpheniramine-HYDROcodone 10-8 MG/5ML Lqcr  Commonly known as:  TUSSIONEX  Take 5 mL by mouth every 12 (twelve) hours as needed.     fluticasone 50 MCG/ACT nasal spray  Commonly known as:  FLONASE  Place 1 spray into the nose daily.     nicotine 21 mg/24hr patch  Commonly known as:  NICODERM CQ - dosed in mg/24 hours  Place 1 patch onto the skin daily.     Rivaroxaban 15 MG Tabs tablet  Commonly known as:  XARELTO  Take 1 tablet (15 mg total) by mouth 2 (two) times daily with a meal. Continue till 10/13/12. Then start daily dose of 20mg .     Rivaroxaban 20 MG Tabs tablet  Commonly known as:  XARELTO  Take 1 tablet (20 mg total) by mouth daily with supper. Start on 10/14/12  Start taking on:  10/14/2012     sitaGLIPtin 25 MG tablet  Commonly known as:  JANUVIA  Take 25 mg by mouth daily.         Brief H and P: For complete details please refer to admission H and P, but in brief 42 yo male h/o dm/osa  presented to urgent care with progressive worsening sob for about a month. He had been coughing a lot also and has been nonbloody. Denied any fevers. No le edema or swelling or calf pain. No recent trauma, no recent surgery, no recent traveling. No recent illnesses. Did start some new diet pills he purchased OTC as he is trying to loose weight. No other new medications. No bleeding issues. No cp. Has osa and using cpap on regular basis. Was sent here from urgent care for mild hypoxia. Found to have multiple bilateral pulmonary emboli and emergent CT scan.   Hospital Course:  Pulmonary embolism, bilateral: Patient was admitted for further workup. CT angiogram of the chest showed segmental and subsegmental sized pulmonary emboli in the lower lobes of the lungs bilaterally, mild cardiomegaly. Doppler ultrasound of the lower extremities did not show any DVT. Patient was initially started on heparin drip and transitioned to xarelto. I was only able to get him preauthorized till 11/07/2012 for xarelto 15mg  BID dose for 3 weeks. Discussed with Dr. Velna Hatchet on phone, assured me that they will work on getting him preauthorized for 20 mg daily dose or provide samples whichever feasible.  Nicotine abuse: Patient was strongly counseled for smoking cessation, he was given prescription for nicotine patch.  Day of Discharge BP 122/69  Pulse 81  Temp(Src) 98.4 F (36.9 C) (Oral)  Resp 18  Ht 6\' 3"  (1.905 m)  Wt 170.8 kg (376 lb 8.7 oz)  BMI 47.06 kg/m2  SpO2 96%  Physical Exam: General: Alert and awake oriented x3 not in any acute distress. HEENT: anicteric sclera, pupils reactive to light and accommodation CVS: S1-S2 clear no murmur rubs or gallops Chest: clear to auscultation bilaterally, no wheezing rales or rhonchi Abdomen: obese soft nontender, nondistended, normal bowel sounds, no organomegaly Extremities: no cyanosis, clubbing or edema noted bilaterally Neuro: Cranial nerves II-XII intact, no  focal neurological deficits   The results of significant diagnostics from this hospitalization (including imaging, microbiology, ancillary and laboratory) are listed below for reference.    LAB RESULTS: Basic Metabolic Panel:  Recent Labs Lab 09/21/12 2354 09/22/12 0533  NA 139 139  K 3.5 3.9  CL 100 102  CO2  --  27  GLUCOSE 189* 154*  BUN 14 14  CREATININE 1.10 1.03  CALCIUM  --  9.3   Liver Function Tests: No results found for this basename: AST, ALT, ALKPHOS, BILITOT, PROT, ALBUMIN,  in the last 168 hours No results found for this basename: LIPASE, AMYLASE,  in the last 168 hours No results found for this basename: AMMONIA,  in the last 168 hours CBC:  Recent Labs Lab 09/21/12 2342 09/21/12 2354 09/22/12 0533  WBC 6.9  --  7.3  HGB 14.4 15.6 14.3  HCT 42.6 46.0 41.6  MCV 88.6  --  88.7  PLT 256  --  267   Cardiac Enzymes:  Recent Labs Lab 09/22/12 0528  TROPONINI <0.30   BNP: No components found with this basename: POCBNP,  CBG:  Recent Labs Lab 09/23/12 0641 09/23/12 1119  GLUCAP 155* 138*    Significant Diagnostic Studies:  Dg Chest 2 View  09/21/2012   *RADIOLOGY REPORT*  Clinical Data: Cough, history of diabetes  CHEST - 2 VIEW  Comparison: 08/28/2009  Findings:  Examination is degraded secondary to patient body habitus.  The lateral radiograph is further degraded secondary to patient motion artifact  Grossly unchanged enlarged cardiac silhouette and mediastinal contours given persistently reduced lung volumes.  The pulmonary vasculature is indistinct with cephalization of flow.  Worsening perihilar and bilateral medial basilar heterogeneous opacities.  No definite pleural effusion or pneumothorax.  Unchanged bones.  IMPRESSION:  Findings of hypoventilation and suspected superimposed mild pulmonary edema, though note, atypical infection may have a similar appearance.  Clinical correlation is advised.   Original Report Authenticated By: Tacey Ruiz,  MD   Ct Angio Chest Pe W/cm &/or Wo Cm  09/22/2012   *RADIOLOGY REPORT*  Clinical Data: Shortness of breath.  Cough.  CT ANGIOGRAPHY CHEST  Technique:  Multidetector CT imaging of the chest using the standard protocol during bolus administration of intravenous contrast. Multiplanar reconstructed images including MIPs were obtained and reviewed to evaluate the vascular anatomy.  Contrast: OMNIPAQUE IOHEXOL 350 MG/ML SOLN  Comparison: Chest CT 03/05/2006.  Findings:  Mediastinum: Study is limited by considerable respiratory motion. Despite these limitations, there appear to be multiple segmental and subsegmental sized filling defects in the pulmonary arteries of the lower lobes of the lungs bilaterally, compatible with pulmonary embolism.  No larger central or lobar sized filling defect is noted. Heart size is mildly enlarged. There is no significant pericardial fluid, thickening or pericardial calcification. No pathologically enlarged mediastinal or hilar lymph nodes. Esophagus is unremarkable in appearance.  Lungs/Pleura: No acute consolidative airspace disease.  No pleural effusions.  No definite suspicious appearing pulmonary nodules or masses are identified at this time.  Upper Abdomen: Unremarkable.  Musculoskeletal: There are no aggressive appearing lytic or blastic lesions noted in the visualized portions of the skeleton.  IMPRESSION: 1.  Although the examination is slightly limited, the study is positive for segmental and subsegmental sized pulmonary emboli in the lower lobes of the lungs bilaterally. 2.  Mild cardiomegaly.  Critical Value/emergent results were called by telephone at the time of interpretation on 09/22/2012 at 03:03 AM to Dr. Dierdre Highman, who verbally acknowledged these results.   Original Report Authenticated By: Trudie Reed, M.D.     Disposition and Follow-up:     Discharge Orders   Future Orders Complete By Expires   Diet Carb Modified  As directed    Increase activity  slowly  As directed        DISPOSITION: Home  DIET: Carb modified diet ACTIVITY: As tolerated TESTS THAT NEED FOLLOW-UP Patient should have repeat hypercoagulable workup checked in 4 weeks  DISCHARGE FOLLOW-UP Follow-up Information   Follow up with Alva Garnet., MD. Schedule an appointment as soon as possible for a visit in 10 days. (for hospital follow-up)    Specialty:  Internal Medicine   Contact information:   [address] [place] [address] [phone]       Time spent on Discharge: 35 mins  Signed:   Max Frye M.D. [facility] 09/23/2012, 12:31 PM Pager: [phone]

## 2012-09-26 NOTE — ED Provider Notes (Signed)
Medical screening examination/treatment/procedure(s) were performed by resident physician or non-physician practitioner and as supervising physician I was immediately available for consultation/collaboration.   KINDL,JAMES DOUGLAS MD.   James D Kindl, MD 09/26/12 0946 

## 2012-12-11 ENCOUNTER — Ambulatory Visit (INDEPENDENT_AMBULATORY_CARE_PROVIDER_SITE_OTHER): Payer: Medicare Other | Admitting: General Surgery

## 2012-12-11 ENCOUNTER — Encounter (INDEPENDENT_AMBULATORY_CARE_PROVIDER_SITE_OTHER): Payer: Self-pay | Admitting: General Surgery

## 2012-12-11 VITALS — BP 142/80 | HR 78 | Temp 97.4°F | Resp 18 | Ht 74.0 in | Wt 369.0 lb

## 2012-12-11 DIAGNOSIS — Z6841 Body Mass Index (BMI) 40.0 and over, adult: Secondary | ICD-10-CM

## 2012-12-11 DIAGNOSIS — K2100 Gastro-esophageal reflux disease with esophagitis, without bleeding: Secondary | ICD-10-CM

## 2012-12-11 DIAGNOSIS — I10 Essential (primary) hypertension: Secondary | ICD-10-CM

## 2012-12-11 NOTE — Patient Instructions (Signed)
We will start the process Continue taking your blood thinner

## 2012-12-13 ENCOUNTER — Encounter (INDEPENDENT_AMBULATORY_CARE_PROVIDER_SITE_OTHER): Payer: Self-pay | Admitting: General Surgery

## 2012-12-13 NOTE — Progress Notes (Signed)
Patient ID: Phillip Frye, male   DOB: 1970-08-27, 42 y.o.   MRN: [id number]  Chief Complaint  Patient presents with  . New Evaluation    new bari    HPI Phillip Frye is a 42 y.o. male.   HPI 42 year old morbidly obese African American male referred by Dr. Dorothyann Peng for evaluation of weight loss surgery. The patient states that he is specifically and she said and the laparoscopic sleeve gastrectomy. The patient is not imaged in the laparoscopic adjustable gastric band procedure because he is not too keen on having to come back frequently for adjustments. He believes with his comorbidities that he would be best suited by a sleeve or a bypass.  He states that he has always been large however over the past 10 years his weight has really gotten out of control. Despite numerous attempts for sustained weight loss he has been unsuccessful. He has tried the [employer] numerous times. He is also exercise on a very frequent basis without any significant results. He is also try to follow a diabetic diet without any significant weight loss either.  His comorbidities include a history of hypertension, bilateral PEs, obstructive sleep apnea on CPAP, gastroesophageal reflux disease, osteoarthritis, insulin-dependent diabetes mellitus, bilateral hip avascular necrosis.  Past Medical History  Diagnosis Date  . Sleep apnea   . Hypertension   . Arthritis   . Obesity   . Diabetes mellitus without complication   . 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 GERD (gastroesophageal reflux disease)     Past Surgical History  Procedure Laterality Date  . Hip surgery Bilateral     Family History  Problem Relation Age of Onset  . Cirrhosis Father   . Diabetes Other   . Diabetes Other     Social History History  Substance Use Topics  . Smoking status: Former Smoker -- 0.00 packs/day    Types: Cigars, Cigarettes    Quit date: 10/11/2012  . Smokeless tobacco: Never Used  . Alcohol  Use: Yes     Comment: occasional    No Known Allergies  Current Outpatient Prescriptions  Medication Sig Dispense Refill  . calcium-vitamin D (OSCAL WITH D) 500-200 MG-UNIT per tablet Take 1 tablet by mouth daily.       . fluticasone (FLONASE) 50 MCG/ACT nasal spray Place 1 spray into the nose daily.  16 g  2  . Rivaroxaban (XARELTO) 20 MG TABS tablet Take 1 tablet (20 mg total) by mouth daily with supper. Start on 10/14/12  30 tablet  4  . sitaGLIPtin (JANUVIA) 25 MG tablet Take 25 mg by mouth daily.      . [DISCONTINUED] metoprolol (LOPRESSOR) 50 MG tablet Take 50 mg by mouth 2 (two) times daily.         No current facility-administered medications for this visit.    Review of Systems Review of Systems  Constitutional: Negative for fever, chills, appetite change and unexpected weight change.       Used to smoke but stopped in August. Has some fatigue.  HENT: Negative for congestion, hearing loss, nosebleeds and trouble swallowing.   Eyes: Negative for visual disturbance.  Respiratory: Negative for chest tightness and shortness of breath.        +OSA, uses CPAP; b/l PE in august 2014. No evidence of DVT on u/s. On xarelto  Cardiovascular: Negative for chest pain and leg swelling.       No orthopnea, mild DOE; +PND. H/o  HTN - no longer on meds. Cardiologist is at [facility]. Says he has had a recent stress test.   Gastrointestinal: Negative for nausea, vomiting, abdominal pain and blood in stool.       States if he doesn't take his reflux med he will have severe reflux/heartburn; controlled with nexium. States he has had an upper endoscopy in the [facility] within last 1-2 yrs. Has more loose stools than solid stool  Endocrine:       Reports he has been a diabetic for about 2-3 years  Genitourinary: Negative for dysuria, hematuria, difficulty urinating and penile pain.  Musculoskeletal: Negative.        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  Neurological: Negative for dizziness, seizures, speech difficulty and light-headedness.       Denies TIAs and amaurosis fugax  Hematological: Does not bruise/bleed easily.  Psychiatric/Behavioral: Negative for behavioral problems and confusion.    Blood pressure 142/80, pulse 78, temperature 97.4 F (36.3 C), temperature source Temporal, resp. rate 18, height 6\' 2"  (1.88 m), weight 369 lb (167.377 kg).  Physical Exam Physical Exam  Vitals reviewed. Constitutional: He is oriented to person, place, and time. He appears well-developed and well-nourished. No distress.  Morbidly obese  HENT:  Head: Normocephalic and atraumatic.  Right Ear: External ear normal.  Left Ear: External ear normal.  Eyes: Conjunctivae are normal. No scleral icterus.  Neck: Normal range of motion. Neck supple. No tracheal deviation present. No thyromegaly present.  Cardiovascular: Normal rate, normal heart sounds and intact distal pulses.   Pulmonary/Chest: Effort normal and breath sounds normal. No respiratory distress. He has no wheezes.  Abdominal: Soft. He exhibits no distension. There is no tenderness. There is no rebound. Hernia confirmed negative in the right inguinal area and confirmed negative in the left inguinal area.  obese  Genitourinary: Testes normal and penis normal.  Musculoskeletal: Normal range of motion. He exhibits no edema and no tenderness.  Lymphadenopathy:    He has no cervical adenopathy.  Neurological: He is alert and oriented to person, place, and time. He exhibits normal muscle tone.  Skin: Skin is warm and dry. No rash noted. He is not diaphoretic. No erythema. No pallor.  Psychiatric: He has a normal mood and affect. His behavior is normal. Judgment and thought content normal.    Data Reviewed abd u/s 10/2011 - no gallstones. No mention  of fatty liver Nuclear stress test 2008 - EF 57%; subtle area of hypokinesis Hospital Discharge Summary along with PE study 09/2012 Hospital discharge summary 2007 and 2008  Assessment    Morbid obesity BMI 47.38 Recent B/l pulmonary emboli Anticoagulated state OSA on CPAP H/o HTN IDDM OA of hips B/l hip avascular necrosis GERD H/o tobacco use     Plan    The patient meets weight loss surgery criteria. I think the patient would be a more acceptable candidate for Laparoscopic Roux-en-Y Gastric bypass. However I could not rule out the possibility of him being a candidate for sleeve gastrectomy. I mainly concerned about his significant reflux which may be worsened with a sleeve gastrectomy. I explained that we would need to get additional workup before I can make the final determination.  We discussed laparoscopic Roux-en-Y gastric bypass. We discussed the preoperative, operative and postoperative process. Using diagrams, I explained  the surgery in detail including the performance of an EGD near the end of the surgery and an Upper GI swallow study on POD 1. We discussed the typical hospital course including a 2-3 day stay baring any complications.   The patient was given educational material. I quoted the patient that they can expect to lose 50-70% of their excess weight with the gastric bypass. We did discuss the possibility of weight regain several years after the procedure.  We discussed the risk and benefits of surgery including but not limited to anesthesia risk, bleeding, infection, anastomotic edema requiring a few additional days in the hospital, postop nausea, possible conversion to open procedure, blood clot formation, anastomotic leak, anastomotic stricture, ulcer formation, death, respiratory complications, intestinal blockage, internal hernia, gallstone formation, vitamin and nutritional deficiencies, hair loss, weight regain injury to surrounding structures, failure to lose weight  and mood changes.  We discussed laparoscopic sleeve gastrectomy. We discussed the preoperative, operative and postoperative process. Using diagrams, I explained the surgery in detail including the performance of an EGD near the end of the surgery and an Upper GI swallow study on POD 1. We discussed the typical hospital course including a 2-3 day stay baring any complications.   The patient was given educational material. I quoted the patient that most patients can lose up to 50-70% of their excess weight. We did discuss the possibility of weight regain several years after the procedure.  The risks of infection, bleeding, pain, scarring, weight regain, too little or too much weight loss, vitamin deficiencies and need for lifelong vitamin supplementation, hair loss, need for protein supplementation, leaks, stricture, reflux, food intolerance, gallstone formation, hernia, need for reoperation and conversion to roux Y gastric bypass, need for open surgery, injury to spleen or surrounding structures, DVT's, PE, and death again discussed with the patient and the patient expressed understanding and desires to proceed with laparoscopic vertical sleeve gastrectomy, possible open, intraoperative endoscopy.  We discussed that before and after surgery that there would be an alteration in their diet. I explained that we have put them on a diet 2 weeks before surgery. I also explained that they would be on a liquid diet for 2 weeks after surgery. We discussed that they would have to avoid certain foods such as sugar after surgery. We discussed the importance of physical activity as well as compliance with our dietary and supplement recommendations and routine follow-up.  I explained to the patient that we will start our evaluation process which includes labs, Upper GI to evaluate stomach and swallowing anatomy, nutritionist consultation, psychiatrist consultation, EKG, CXR, Cardiac consultation, Pulmonary consultation. I  explained that he would need to be out of his 6 month window From his bilateral pulmonary emboli in August before we could proceed with bariatric surgery; However I explained that we could start the workup and pathway to surgery since it typically takes 3 months.  I explained that I wanted him to see the pulmonologist to comment on his perioperative anticoagulation recommendations as well as other thoughts of whether or not the patient should require a preoperative IVC filter placement.  I explained that we would start the process and encouraged him to contact the office should have any questions  UPDATE: I contacted the patient on Sunday and has done his HIV status. He states that as far as he knew he was negative. He states he had a test in the last year health which was negative. I explained I could not find documentation of that.  He states he is not sexually active. I told them that we would just add it to his screening labs and he voiced understanding.  Mary Sella. Andrey Campanile, MD, FACS General, Bariatric, & Minimally Invasive Surgery [facility], [place]          [facility] M 12/13/2012, 10:27 AM

## 2012-12-14 ENCOUNTER — Telehealth (INDEPENDENT_AMBULATORY_CARE_PROVIDER_SITE_OTHER): Payer: Self-pay | Admitting: General Surgery

## 2012-12-14 DIAGNOSIS — I2699 Other pulmonary embolism without acute cor pulmonale: Secondary | ICD-10-CM

## 2012-12-14 LAB — CBC WITH DIFFERENTIAL/PLATELET
Basophils Absolute: 0 10*3/uL (ref 0.0–0.1)
Basophils Relative: 0 % (ref 0–1)
Eosinophils Absolute: 0.1 10*3/uL (ref 0.0–0.7)
Eosinophils Relative: 1 % (ref 0–5)
HCT: 40.6 % (ref 39.0–52.0)
Hemoglobin: 13.8 g/dL (ref 13.0–17.0)
Lymphocytes Relative: 48 % — ABNORMAL HIGH (ref 12–46)
Lymphs Abs: 3.2 10*3/uL (ref 0.7–4.0)
MCH: 30.7 pg (ref 26.0–34.0)
MCHC: 34 g/dL (ref 30.0–36.0)
MCV: 90.2 fL (ref 78.0–100.0)
Monocytes Absolute: 0.7 10*3/uL (ref 0.1–1.0)
Monocytes Relative: 11 % (ref 3–12)
Neutro Abs: 2.6 10*3/uL (ref 1.7–7.7)
Neutrophils Relative %: 40 % — ABNORMAL LOW (ref 43–77)
Platelets: 297 10*3/uL (ref 150–400)
RBC: 4.5 MIL/uL (ref 4.22–5.81)
RDW: 13.3 % (ref 11.5–15.5)
WBC: 6.6 10*3/uL (ref 4.0–10.5)

## 2012-12-14 LAB — HEMOGLOBIN A1C
Hgb A1c MFr Bld: 6.4 % — ABNORMAL HIGH (ref <5.7)
Mean Plasma Glucose: 137 mg/dL — ABNORMAL HIGH (ref <117)

## 2012-12-14 NOTE — Telephone Encounter (Signed)
Referrals placed to [facility] and given to referral coordinator to set up.

## 2012-12-14 NOTE — Telephone Encounter (Signed)
Message copied by Liliana Cline on Mon Dec 14, 2012  9:49 AM ------      Message from: Andrey Campanile, ERIC M      Created: Sun Dec 13, 2012 10:38 AM       This guy will need cardiac and pulmonary consultations.       Has seen [place] [facility] for heart      Has seen Dr Christophe Louis for pulm in 2008            With pulmonary, i am specifically interested in their recs for VTE (blood clot) prophylaxis around time of surgery. Obviously we are going to wait a full 6 months from his dx of PEs in Aug 2014 before we operate. But does pt just need our routine preop dose? IVC filter?             Thanks      wilson ------

## 2012-12-15 LAB — LIPID PANEL
Cholesterol: 104 mg/dL (ref 0–200)
HDL: 30 mg/dL — ABNORMAL LOW (ref >39)
LDL Cholesterol: 59 mg/dL (ref 0–99)
Total CHOL/HDL Ratio: 3.5 Ratio
Triglycerides: 76 mg/dL (ref <150)
VLDL: 15 mg/dL (ref 0–40)

## 2012-12-15 LAB — COMPREHENSIVE METABOLIC PANEL
ALT: 31 U/L (ref 0–53)
AST: 33 U/L (ref 0–37)
Albumin: 4.3 g/dL (ref 3.5–5.2)
Alkaline Phosphatase: 66 U/L (ref 39–117)
BUN: 14 mg/dL (ref 6–23)
CO2: 27 mEq/L (ref 19–32)
Calcium: 9.6 mg/dL (ref 8.4–10.5)
Chloride: 104 mEq/L (ref 96–112)
Creat: 1.08 mg/dL (ref 0.50–1.35)
Glucose, Bld: 83 mg/dL (ref 70–99)
Potassium: 4.2 mEq/L (ref 3.5–5.3)
Sodium: 141 mEq/L (ref 135–145)
Total Bilirubin: 0.6 mg/dL (ref 0.3–1.2)
Total Protein: 7.1 g/dL (ref 6.0–8.3)

## 2012-12-15 LAB — PROTIME-INR
INR: 0.95 (ref <1.50)
Prothrombin Time: 12.7 seconds (ref 11.6–15.2)

## 2012-12-15 LAB — H. PYLORI ANTIBODY, IGG: H Pylori IgG: 4.41 {ISR} — ABNORMAL HIGH

## 2012-12-15 LAB — T4: T4, Total: 7.9 ug/dL (ref 5.0–12.5)

## 2012-12-15 LAB — TSH: TSH: 0.636 u[IU]/mL (ref 0.350–4.500)

## 2012-12-17 ENCOUNTER — Other Ambulatory Visit: Payer: Self-pay

## 2012-12-18 ENCOUNTER — Institutional Professional Consult (permissible substitution): Payer: Medicare Other | Admitting: Internal Medicine

## 2012-12-21 ENCOUNTER — Ambulatory Visit: Payer: Medicare Other | Admitting: Cardiology

## 2012-12-21 ENCOUNTER — Ambulatory Visit (INDEPENDENT_AMBULATORY_CARE_PROVIDER_SITE_OTHER): Payer: Medicare Other | Admitting: Cardiology

## 2012-12-21 ENCOUNTER — Encounter: Payer: Self-pay | Admitting: Cardiology

## 2012-12-21 VITALS — BP 132/94 | HR 78 | Ht 75.0 in | Wt 367.0 lb

## 2012-12-21 DIAGNOSIS — Z01810 Encounter for preprocedural cardiovascular examination: Secondary | ICD-10-CM

## 2012-12-21 DIAGNOSIS — I10 Essential (primary) hypertension: Secondary | ICD-10-CM

## 2012-12-21 DIAGNOSIS — R0602 Shortness of breath: Secondary | ICD-10-CM

## 2012-12-21 DIAGNOSIS — R9431 Abnormal electrocardiogram [ECG] [EKG]: Secondary | ICD-10-CM

## 2012-12-21 NOTE — Progress Notes (Signed)
HPI The patient is being seen for preop evaluation prior to possible bariatric surgery.  He has no prior cardiac history. He did review that he has had a stress perfusion study and an echocardiogram a few years ago. He was most recently in the hospital with pulmonary embolism and is being treated with Xarelto.  He is not particularly active. He does his chores of daily living and work full-time. There is some activity with this. He does get short of breath climbing a flight of stairs or walking 50 yards on level ground. He has morbid obesity and sleep apnea. He does not get PND or orthopnea. He says he might have some chest heaviness when his breathing are describes this to his weight. He does not describe neck or arm discomfort. He does not describe palpitations, presyncope or syncope. He was found on embolism chief complaint cough  No Known Allergies  Current Outpatient Prescriptions  Medication Sig Dispense Refill  . calcium-vitamin D (OSCAL WITH D) 500-200 MG-UNIT per tablet Take 1 tablet by mouth daily.       . fluticasone (FLONASE) 50 MCG/ACT nasal spray Place 1 spray into the nose daily.  16 g  2  . Liraglutide (VICTOZA) 18 MG/3ML SOPN Inject 12 Units into the skin daily.      . Rivaroxaban (XARELTO) 20 MG TABS tablet Take 1 tablet (20 mg total) by mouth daily with supper. Start on 10/14/12  30 tablet  4  . sitaGLIPtin (JANUVIA) 25 MG tablet Take 25 mg by mouth daily.      . [DISCONTINUED] metoprolol (LOPRESSOR) 50 MG tablet Take 50 mg by mouth 2 (two) times daily.         No current facility-administered medications for this visit.    Past Medical History  Diagnosis Date  . Sleep apnea   . Hypertension   . Arthritis   . Obesity   . Diabetes mellitus without complication   . 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 GERD (gastroesophageal reflux disease)     Past Surgical History  Procedure Laterality Date  . Hip surgery Bilateral     Family History  Problem  Relation Age of Onset  . Cirrhosis Father   . Diabetes Other   . Diabetes Other     History   Social History  . Marital Status: Single    Spouse Name: N/A    Number of Children: N/A  . Years of Education: N/A   Occupational History  . Not on file.   Social History Main Topics  . Smoking status: Former Smoker -- 0.00 packs/day    Types: Cigars, Cigarettes    Quit date: 10/11/2012  . Smokeless tobacco: Never Used  . Alcohol Use: Yes     Comment: occasional  . Drug Use: No  . Sexual Activity: Not on file   Other Topics Concern  . Not on file   Social History Narrative   Lives alone.  Works at [employer].    ROS:  Positive for seasonal allergies. Otherwise as stated in the history of present illness and negative for all other systems.   PHYSICAL EXAM BP 132/94  Pulse 78  Ht 6\' 3"  (1.905 m)  Wt 367 lb (166.47 kg)  BMI 45.87 kg/m2 GENERAL:  Well appearing HEENT:  Pupils equal round and reactive, fundi not visualized, oral mucosa unremarkable NECK:  No jugular venous distention, waveform within normal limits, carotid upstroke brisk and symmetric, no bruits, no thyromegaly  LYMPHATICS:  No cervical, inguinal adenopathy LUNGS:  Clear to auscultation bilaterally BACK:  No CVA tenderness CHEST:  Unremarkable HEART:  PMI not displaced or sustained,S1 and S2 within normal limits, no S3, no S4, no clicks, no rubs, no murmurs ABD:  Flat, positive bowel sounds normal in frequency in pitch, no bruits, no rebound, no guarding, no midline pulsatile mass, no hepatomegaly, no splenomegaly EXT:  2 plus pulses throughout, no edema, no cyanosis no clubbing SKIN:  No rashes no nodules NEURO:  Cranial nerves II through XII grossly intact, motor grossly intact throughout PSYCH:  Cognitively intact, oriented to person place and time  EKG:  Sinus rhythm, rate 78, axis within normal limits, intervals within normal limits, inferolateral T wave inversions consistent with ischemia. This is  not different in August 2014 12/21/2012   ASSESSMENT AND PLAN  ABNORMAL EKG:  He does have an abnormal EKG which is not new. However, given his risk factors stress testing is indicated. The EKG would be uninterpretable by itself and so he will have an exercise echocardiogram. This will allow me to screen for LVH as an etiology of his EKG changes as well.  HISTORY OF PULMONARY EMBOLISM:  The patient will continue his Xarelto.  We will discuss the timing of discontinuation based on upcoming surgery. He will need at least a six-month course of this.  PREOP EVALUATION:  According to ACC/AHA guidelines the patient needs stress testing as above.

## 2012-12-21 NOTE — Patient Instructions (Signed)
Your physician recommends that you schedule a follow-up appointment in: AS NEEDED PENDING TEST RESULTS  Your physician has requested that you have a stress echocardiogram. For further information please visit [email or web address] Please follow instruction sheet as given.  AT [facility] OFFICE

## 2012-12-23 ENCOUNTER — Institutional Professional Consult (permissible substitution): Payer: Medicare Other | Admitting: Internal Medicine

## 2012-12-23 DIAGNOSIS — R9431 Abnormal electrocardiogram [ECG] [EKG]: Secondary | ICD-10-CM | POA: Insufficient documentation

## 2012-12-24 ENCOUNTER — Telehealth (INDEPENDENT_AMBULATORY_CARE_PROVIDER_SITE_OTHER): Payer: Self-pay | Admitting: General Surgery

## 2012-12-24 ENCOUNTER — Ambulatory Visit (HOSPITAL_COMMUNITY)
Admission: RE | Admit: 2012-12-24 | Discharge: 2012-12-24 | Disposition: A | Discharge status: Home / Self Care | Payer: Medicare Other | Source: Ambulatory Visit | Attending: General Surgery | Admitting: General Surgery

## 2012-12-24 DIAGNOSIS — I10 Essential (primary) hypertension: Secondary | ICD-10-CM | POA: Insufficient documentation

## 2012-12-24 DIAGNOSIS — E119 Type 2 diabetes mellitus without complications: Secondary | ICD-10-CM | POA: Insufficient documentation

## 2012-12-24 DIAGNOSIS — Z6841 Body Mass Index (BMI) 40.0 and over, adult: Secondary | ICD-10-CM | POA: Insufficient documentation

## 2012-12-24 DIAGNOSIS — K219 Gastro-esophageal reflux disease without esophagitis: Secondary | ICD-10-CM | POA: Insufficient documentation

## 2012-12-24 DIAGNOSIS — G4733 Obstructive sleep apnea (adult) (pediatric): Secondary | ICD-10-CM | POA: Insufficient documentation

## 2012-12-24 DIAGNOSIS — Z87891 Personal history of nicotine dependence: Secondary | ICD-10-CM | POA: Insufficient documentation

## 2012-12-24 DIAGNOSIS — K224 Dyskinesia of esophagus: Secondary | ICD-10-CM | POA: Insufficient documentation

## 2012-12-24 DIAGNOSIS — K449 Diaphragmatic hernia without obstruction or gangrene: Secondary | ICD-10-CM | POA: Insufficient documentation

## 2012-12-24 NOTE — Telephone Encounter (Signed)
Called and spoke with Phillip Frye at [phone]. Made her aware to place order for breath tek test. She will place order. Appt made at [facility] endoscopy on 12/31/2012 at 7:15. NPO after midnight. No pepto for one week prior. No antibiotics 2 weeks prior. Called patient and he was already set up for 12/28/2012. Made him aware I would cancel the test on 12/31/2012. LMOM with Phillip Frye making her aware to cancel appt on 12/31/2012.

## 2012-12-24 NOTE — Telephone Encounter (Signed)
Message copied by Liliana Cline on Thu Dec 24, 2012  4:19 PM ------      Message from: Andrey Campanile, ERIC M      Created: Thu Dec 24, 2012  2:28 PM       i forwarded labs to his pcp for their records      Pt needs a H pylori breath test since his serum level was elevated. ------

## 2012-12-28 ENCOUNTER — Encounter (HOSPITAL_COMMUNITY)
Admission: RE | Disposition: A | Discharge status: Home / Self Care | Payer: Self-pay | Source: Ambulatory Visit | Attending: General Surgery

## 2012-12-28 ENCOUNTER — Ambulatory Visit (HOSPITAL_COMMUNITY)
Admission: RE | Admit: 2012-12-28 | Discharge: 2012-12-28 | Disposition: A | Discharge status: Home / Self Care | Payer: Medicare Other | Source: Ambulatory Visit | Attending: General Surgery | Admitting: General Surgery

## 2012-12-28 DIAGNOSIS — Z01818 Encounter for other preprocedural examination: Secondary | ICD-10-CM | POA: Insufficient documentation

## 2012-12-28 HISTORY — PX: BREATH TEK H PYLORI: SHX5422

## 2012-12-28 SURGERY — BREATH TEST, FOR HELICOBACTER PYLORI

## 2012-12-29 ENCOUNTER — Telehealth (INDEPENDENT_AMBULATORY_CARE_PROVIDER_SITE_OTHER): Payer: Self-pay | Admitting: General Surgery

## 2012-12-29 ENCOUNTER — Encounter (HOSPITAL_COMMUNITY): Payer: Self-pay | Admitting: General Surgery

## 2012-12-29 ENCOUNTER — Other Ambulatory Visit: Payer: Self-pay | Admitting: *Deleted

## 2012-12-29 DIAGNOSIS — I10 Essential (primary) hypertension: Secondary | ICD-10-CM

## 2012-12-29 DIAGNOSIS — R0602 Shortness of breath: Secondary | ICD-10-CM

## 2012-12-29 DIAGNOSIS — R9431 Abnormal electrocardiogram [ECG] [EKG]: Secondary | ICD-10-CM

## 2012-12-29 NOTE — Telephone Encounter (Signed)
Breath test was positive so he will need a Prevpac please.

## 2012-12-29 NOTE — Telephone Encounter (Signed)
Prevpac #28 BID x 14 days called to [facility] pharmacy. Patient aware.

## 2012-12-29 NOTE — Telephone Encounter (Signed)
Dr Andrey Campanile - I don't see a final report from patient's breath tek? Please advise.

## 2012-12-29 NOTE — Telephone Encounter (Signed)
Message copied by Liliana Cline on Tue Dec 29, 2012  2:58 PM ------      Message from: Marin Shutter      Created: Tue Dec 29, 2012  2:52 PM      Regarding: Dr. Andrey Campanile      Contact: [phone]       Pt called.  He wanted antibiotics for 'bacteria in his stomach'/per patient.  I told him to call his PCP.  Do you think this is an issue that Dr. Andrey Campanile would like to see for himself? Wasn't sure.  Thx ------

## 2012-12-30 ENCOUNTER — Ambulatory Visit: Payer: Medicare Other | Admitting: Cardiology

## 2012-12-30 NOTE — Telephone Encounter (Signed)
Received note for prior authorization needed for medication. I called and spoke with Nedra Hai, pharmacist at [facility], who advised we could call this in with all the medication components (Amoxicillin, Biaxin, Prevacid) and it should be covered. I advised him to do that and he will call with any questions.

## 2012-12-31 ENCOUNTER — Encounter (HOSPITAL_COMMUNITY): Payer: Self-pay

## 2012-12-31 ENCOUNTER — Ambulatory Visit (HOSPITAL_COMMUNITY): Admit: 2012-12-31 | Payer: Self-pay | Admitting: General Surgery

## 2012-12-31 SURGERY — BREATH TEST, FOR HELICOBACTER PYLORI

## 2012-12-31 NOTE — Telephone Encounter (Signed)
Patient called checking in on status of prescription. Advised prevpac was broken up to 3 different components so that his insurance will help cover it. Advised him to call his pharmacist to see if it is ready. Patient understands.

## 2013-01-05 ENCOUNTER — Encounter (INDEPENDENT_AMBULATORY_CARE_PROVIDER_SITE_OTHER): Payer: Self-pay

## 2013-01-08 ENCOUNTER — Other Ambulatory Visit (HOSPITAL_COMMUNITY): Payer: Medicare Other

## 2013-01-08 ENCOUNTER — Institutional Professional Consult (permissible substitution): Payer: Medicare Other | Admitting: Internal Medicine

## 2013-01-14 ENCOUNTER — Ambulatory Visit (INDEPENDENT_AMBULATORY_CARE_PROVIDER_SITE_OTHER): Payer: Medicare Other | Admitting: Internal Medicine

## 2013-01-14 ENCOUNTER — Encounter: Payer: Self-pay | Admitting: Internal Medicine

## 2013-01-14 VITALS — BP 138/88 | HR 87 | Temp 98.2°F | Ht 74.0 in | Wt 367.6 lb

## 2013-01-14 DIAGNOSIS — G4730 Sleep apnea, unspecified: Secondary | ICD-10-CM

## 2013-01-14 DIAGNOSIS — R0602 Shortness of breath: Secondary | ICD-10-CM

## 2013-01-14 DIAGNOSIS — I2699 Other pulmonary embolism without acute cor pulmonale: Secondary | ICD-10-CM

## 2013-01-14 NOTE — Patient Instructions (Signed)
GERD (REFLUX)  is an extremely common cause of respiratory symptoms, many times with no significant heartburn at all.    It can be treated with medication, but also with lifestyle changes including avoidance of late meals, excessive alcohol, smoking cessation, and avoid fatty foods, chocolate, peppermint, colas, red wine, and acidic juices such as orange juice.  NO MINT OR MENTHOL PRODUCTS SO NO COUGH DROPS  USE SUGARLESS CANDY INSTEAD (jolley ranchers or Stover's)  NO OIL BASED VITAMINS - use powdered substitutes.   Please see patient coordinator before you leave today  to sort out your echo schedule - must be done before I can clear you for surgery.

## 2013-01-14 NOTE — Progress Notes (Addendum)
Subjective:    Patient ID: Phillip Frye, male    DOB: [birth year]  MRN: [id number]  HPI  42 yobm quit smoking cigars 10/2012 with h/o PE:  Admit date: 09/21/2012  Discharge date: 09/23/2012  Primary Care Physician: Phillip Aliment, MD  Discharge Diagnoses:  . Pulmonary embolism, bilateral . Diabetes mellitus without complication . HYPERTENSION . Obesity . SLEEP APNEA . SOB (shortness of breath) . Pulmonary embolism, bilateral  Consults: None  Discharge Medications:    Medication List         benzonatate 100 MG capsule    Commonly known as: TESSALON    Take 1 capsule (100 mg total) by mouth 3 (three) times daily.    calcium-vitamin D 500-200 MG-UNIT per tablet    Commonly known as: OSCAL WITH D    Take 1 tablet by mouth daily.    chlorpheniramine-HYDROcodone 10-8 MG/5ML Lqcr    Commonly known as: TUSSIONEX    Take 5 mL by mouth every 12 (twelve) hours as needed.    fluticasone 50 MCG/ACT nasal spray    Commonly known as: FLONASE    Place 1 spray into the nose daily.    nicotine 21 mg/24hr patch    Commonly known as: NICODERM CQ - dosed in mg/24 hours    Place 1 patch onto the skin daily.    Rivaroxaban 15 MG Tabs tablet    Commonly known as: XARELTO    Take 1 tablet (15 mg total) by mouth 2 (two) times daily with a meal. Continue till 10/13/12. Then start daily dose of 20mg .    Rivaroxaban 20 MG Tabs tablet    Commonly known as: XARELTO    Take 1 tablet (20 mg total) by mouth daily with supper. Start on 10/14/12    Start taking on: 10/14/2012    sitaGLIPtin 25 MG tablet    Commonly known as: JANUVIA    Take 25 mg by mouth daily.     Brief H and P:  For complete details please refer to admission H and P, but in brief 42 yo male h/o dm/osa presented to urgent care with progressive worsening sob for about a month. He had been coughing a lot also and has been nonbloody. Denied any fevers. No le edema or swelling or calf pain. No recent trauma, no recent surgery, no recent  traveling. No recent illnesses. Did start some new diet pills he purchased OTC as he is trying to loose weight. No other new medications. No bleeding issues. No cp. Has osa and using cpap on regular basis. Was sent here from urgent care for mild hypoxia. Found to have multiple bilateral pulmonary emboli and emergent CT scan.     01/14/2013 1st [facility] office visit/ Phillip Frye cc doe up steps x years proportionate to wt gain, predated his dx of PE. Wears cpap qhs x 5 y, does ok but when pulls it off finds  sometimes sleepy daytime. No sob lying flat on or off cpap, no leg swelling- has some problems with stuffy nose controlled on flonase   No obvious day to day or daytime variabilty or assoc chronic cough or cp or chest tightness, subjective wheeze overt  hb symptoms. No unusual exp hx or h/o childhood pna/ asthma or knowledge of premature birth.  Sleeping ok without nocturnal  or early am exacerbation  of respiratory  c/o's or need for noct saba. Also denies any obvious fluctuation of symptoms with weather or environmental changes or other aggravating or  alleviating factors except as outlined above   Current Medications, Allergies, Complete Past Medical History, Past Surgical History, Family History, and Social History were reviewed in [employer] record.              Review of Systems  Constitutional: Negative for fever, chills, activity change, appetite change and unexpected weight change.  HENT: Negative for congestion, dental problem, postnasal drip, rhinorrhea, sneezing, sore throat, trouble swallowing and voice change.   Eyes: Negative for visual disturbance.  Respiratory: Negative for cough, choking and shortness of breath.   Cardiovascular: Negative for chest pain and leg swelling.  Gastrointestinal: Negative for nausea, vomiting and abdominal pain.  Genitourinary: Negative for difficulty urinating.  Musculoskeletal: Negative for arthralgias.  Skin:  Negative for rash.  Psychiatric/Behavioral: Negative for behavioral problems and confusion.       Objective:   Physical Exam  Obese amb bm nad  Wt Readings from Last 3 Encounters:  01/14/13 367 lb 9.6 oz (166.742 kg)  12/21/12 367 lb (166.47 kg)  12/11/12 369 lb (167.377 kg)      HEENT: nl dentition, turbinates, and orophanx. Nl external ear canals without cough reflex   NECK :  without JVD/Nodes/TM/ nl carotid upstrokes bilaterally   LUNGS: no acc muscle use, clear to A and P bilaterally without cough on insp or exp maneuvers   CV:  RRR  no s3 or murmur or increase in P2, no edema   ABD:  soft and nontender with nl excursion in the supine position. No bruits or organomegaly, bowel sounds nl  MS:  warm without deformities, calf tenderness, cyanosis or clubbing  SKIN: warm and dry without lesions    NEURO:  alert, approp, no deficits    cxr  12/24/12  No acute cardiopulmonary process.     Assessment & Plan:

## 2013-01-16 NOTE — Assessment & Plan Note (Signed)
cpap dep, well compensated when he doesn't pull off the device accidentally while sleeping > may need sleep eval by one of our sleep docs if becomes more problematic

## 2013-01-16 NOTE — Assessment & Plan Note (Signed)
-   01/14/2013  Walked RA x 3 laps @ 185 ft each stopped due to  End of study, sats 95%    Most likely related to obesity, pre-dated PE and proportionate to wt gain

## 2013-01-16 NOTE — Assessment & Plan Note (Addendum)
Dx 09/22/12 bilateral by CTa - 01/14/2013  Walked RA x 3 laps @ 185 ft each stopped due to  End of study, sats 95%   - Venous dopplers 09/22/12 neg bilaterally   Treatment should be a minimum of 6 months and because obesity is the main risk factor ideally continue the xarelto until BMI < 30 which most likely will require bariatric surgery and sev years of approp diet/ ex.  In meantime he will be at risk of recurrent clotting but if echo is normal now it would reasonable to stop the xarelto x 3 days preop and restart lovenox immediately post op until able to take po effectively then to reload with xarelto.   Discussed in detail all the  indications, usual  risks and alternatives  relative to the benefits with patient who agrees to proceed with w/u and rx as planned > See instructions for specific recommendations which were reviewed directly with the patient who was given a copy with highlighter outlining the key components.

## 2013-01-18 ENCOUNTER — Ambulatory Visit: Payer: Medicare Other | Admitting: Dietician

## 2013-01-25 ENCOUNTER — Telehealth (INDEPENDENT_AMBULATORY_CARE_PROVIDER_SITE_OTHER): Payer: Self-pay | Admitting: *Deleted

## 2013-01-25 ENCOUNTER — Ambulatory Visit (HOSPITAL_COMMUNITY): Payer: Medicare Other | Attending: Cardiology | Admitting: Radiology

## 2013-01-25 DIAGNOSIS — I10 Essential (primary) hypertension: Secondary | ICD-10-CM

## 2013-01-25 DIAGNOSIS — R9431 Abnormal electrocardiogram [ECG] [EKG]: Secondary | ICD-10-CM | POA: Insufficient documentation

## 2013-01-25 DIAGNOSIS — E669 Obesity, unspecified: Secondary | ICD-10-CM | POA: Insufficient documentation

## 2013-01-25 DIAGNOSIS — R0602 Shortness of breath: Secondary | ICD-10-CM

## 2013-01-25 DIAGNOSIS — R0609 Other forms of dyspnea: Secondary | ICD-10-CM | POA: Insufficient documentation

## 2013-01-25 DIAGNOSIS — E119 Type 2 diabetes mellitus without complications: Secondary | ICD-10-CM | POA: Insufficient documentation

## 2013-01-25 DIAGNOSIS — R0989 Other specified symptoms and signs involving the circulatory and respiratory systems: Secondary | ICD-10-CM | POA: Insufficient documentation

## 2013-01-25 DIAGNOSIS — Z87891 Personal history of nicotine dependence: Secondary | ICD-10-CM | POA: Insufficient documentation

## 2013-01-25 NOTE — Telephone Encounter (Signed)
Pt called stating that he has seen both the cardiologist and pulmonologist.  He states he is ready for the next step to schedule surgery.  I informed him that I would send a message to Dr. Andrey Campanile so that he can determine the next steps after reviewing their notes.  Pt is agreeable with this plan at this time.

## 2013-01-25 NOTE — Progress Notes (Signed)
Echocardiogram performed.  

## 2013-01-26 ENCOUNTER — Encounter (INDEPENDENT_AMBULATORY_CARE_PROVIDER_SITE_OTHER): Payer: Self-pay

## 2013-01-26 NOTE — Telephone Encounter (Signed)
This is a bariatric patient. Phillip Frye- Please advise.

## 2013-02-19 ENCOUNTER — Telehealth: Payer: Self-pay | Admitting: Internal Medicine

## 2013-02-19 ENCOUNTER — Encounter: Payer: Self-pay | Admitting: Internal Medicine

## 2013-02-19 NOTE — Telephone Encounter (Signed)
Error.Phillip Frye ° °

## 2013-02-19 NOTE — Telephone Encounter (Signed)
Called and spoke with pt and he is aware of MW recs.  Nothing further is needed.  

## 2013-02-19 NOTE — Telephone Encounter (Signed)
[facility] was not aware of [facility] but agree with him there is no contraindication to surgery

## 2013-02-19 NOTE — Telephone Encounter (Signed)
Pt calling in ref to previous msg can be reached at [phone].Phillip EvertsJuanita S Frye

## 2013-02-19 NOTE — Telephone Encounter (Signed)
According to 01/14/13 OV with MW: Please see patient coordinator before you leave today  to sort out your echo schedule - must be done before I can clear you for surgery.  Pt had echo done (in epic). Please advise MW thanks

## 2013-03-04 ENCOUNTER — Encounter: Payer: Self-pay | Admitting: Dietician

## 2013-03-04 ENCOUNTER — Encounter: Payer: Medicare Other | Attending: General Surgery | Admitting: Dietician

## 2013-03-04 VITALS — Ht 74.0 in | Wt 360.4 lb

## 2013-03-04 DIAGNOSIS — Z713 Dietary counseling and surveillance: Secondary | ICD-10-CM | POA: Insufficient documentation

## 2013-03-04 DIAGNOSIS — Z6841 Body Mass Index (BMI) 40.0 and over, adult: Secondary | ICD-10-CM | POA: Insufficient documentation

## 2013-03-04 NOTE — Progress Notes (Signed)
  Pre-Op Assessment Visit:  Pre-Operative RYGB Surgery  Medical Nutrition Therapy:  Appt start time: 0930  End time:  1015  Patient was seen on 03/04/13 for Pre-Operative RYGB Nutrition Assessment. [facility] on 03/04/13.   Handouts given during visit include:  Pre-Op Goals Bariatric Surgery Protein Shakes  Patient to call the [facility] to enroll in Pre-Op and Post-Op Nutrition Education when surgery date is scheduled.

## 2013-03-04 NOTE — Patient Instructions (Signed)
Patient to call [facility] when surgery is scheduled to enroll in pre op class. 

## 2013-04-09 ENCOUNTER — Encounter (INDEPENDENT_AMBULATORY_CARE_PROVIDER_SITE_OTHER): Payer: Self-pay | Admitting: General Surgery

## 2013-04-09 ENCOUNTER — Ambulatory Visit (INDEPENDENT_AMBULATORY_CARE_PROVIDER_SITE_OTHER): Payer: Medicare Other | Admitting: General Surgery

## 2013-04-09 VITALS — BP 124/80 | HR 108 | Temp 98.1°F | Resp 18 | Ht 74.0 in | Wt 359.0 lb

## 2013-04-09 NOTE — Patient Instructions (Signed)
Keep up the good work! Please watch the EMMI video on sleeve gastrectomy Read over Sleeve gastrectomy surgical consent

## 2013-04-09 NOTE — Progress Notes (Signed)
Patient ID: Phillip Frye, male   DOB: 1970/12/11, 43 y.o.   MRN: [id number]  Chief Complaint  Patient presents with  . Bariatric Pre-op    RNY    HPI Phillip Frye is a 43 y.o. male.   HPI 43 year old morbidly obese African American male comes in to discuss weight loss surgery. I initially met him on 12/11/2012. I had him go through an extensive workup because of his comorbidities prior to submitting his packet to his insurance for authorization for laparoscopic sleeve gastrectomy with possible hiatal hernia repair. He was diagnosed with bilateral pulmonary emboli in August 2014 and is on xarelto. He denies any new changes since he was last seen. He denies any abdominal pain. He is still taking his oral anticoagulation. He has lost 10 pounds through better food choices since I last saw him.  Past Medical History  Diagnosis Date  . Sleep apnea   . Hypertension   . Arthritis   . Obesity   . Diabetes mellitus without complication   . 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 GERD (gastroesophageal reflux disease)     Past Surgical History  Procedure Laterality Date  . Hip surgery Bilateral   . Breath tek h pylori N/A 12/28/2012    Procedure: BREATH TEK H PYLORI;  Foor: Gayland Curry, MD;  Location: [facility];  Service: General;  Laterality: N/A;    Family History  Problem Relation Age of Onset  . Cirrhosis Father   . Diabetes Other   . Diabetes Other   . Emphysema Paternal Grandfather     smoked    Social History History  Substance Use Topics  . Smoking status: Former Smoker -- 0.05 packs/day for 6 years    Types: Cigars    Quit date: 10/15/2012  . Smokeless tobacco: Never Used  . Alcohol Use: Yes     Comment: occasional    No Known Allergies  Current Outpatient Prescriptions  Medication Sig Dispense Refill  . calcium-vitamin D (OSCAL WITH D) 500-200 MG-UNIT per tablet Take 1 tablet by mouth daily.       . fluticasone (FLONASE) 50 MCG/ACT nasal  spray Place 1 spray into the nose daily.  16 g  2  . Liraglutide (VICTOZA) 18 MG/3ML SOPN Inject 12 Units into the skin daily.      . Rivaroxaban (XARELTO) 20 MG TABS tablet Take 1 tablet (20 mg total) by mouth daily with supper. Start on 10/14/12  30 tablet  4  . sitaGLIPtin (JANUVIA) 25 MG tablet Take 25 mg by mouth daily.      . [DISCONTINUED] metoprolol (LOPRESSOR) 50 MG tablet Take 50 mg by mouth 2 (two) times daily.         No current facility-administered medications for this visit.    Review of Systems Review of Systems  Constitutional: Negative for fever, chills, appetite change and unexpected weight change.  HENT: Negative for congestion and trouble swallowing.   Eyes: Negative for visual disturbance.  Respiratory: Negative for chest tightness and shortness of breath.        +OSA on cpap  Cardiovascular: Negative for chest pain and leg swelling.       No PND, no orthopnea, some DOE; h/o HTN - no longer on BP meds. Had stress test in November - see below;   Gastrointestinal:       Some occassional reflux, used to take nexium  Genitourinary: Negative for dysuria and hematuria.  Musculoskeletal: Negative.  Needs hip replacement; b/l hip avascular necrosis  Skin: Negative for rash.  Neurological: Negative for seizures and speech difficulty.  Hematological: Does not bruise/bleed easily.       On xarelto for b/l in august 2014. Cards rec continuing xarelto until BMI <30 since obesity was inciting factor for PE.   Psychiatric/Behavioral: Negative for behavioral problems and confusion.    Blood pressure 124/80, pulse 108, temperature 98.1 F (36.7 C), resp. rate 18, height $RemoveBe'6\' 2"'pDOeuvaqS$  (1.88 m), weight 359 lb (162.841 kg).  Physical Exam Physical Exam  Vitals reviewed. Constitutional: He is oriented to person, place, and time. He appears well-developed and well-nourished. No distress.  Morbidly obese  HENT:  Head: Normocephalic and atraumatic.  Right Ear: External ear normal.   Left Ear: External ear normal.  Eyes: Conjunctivae are normal. No scleral icterus.  Neck: Normal range of motion. Neck supple. No tracheal deviation present. No thyromegaly present.  Cardiovascular: Normal rate, normal heart sounds and intact distal pulses.   Pulmonary/Chest: Effort normal and breath sounds normal. No respiratory distress. He has no wheezes.  Abdominal: Soft. He exhibits no distension. There is no tenderness. There is no rebound.  Tattoo lower abdomen  Musculoskeletal: Normal range of motion. He exhibits no edema and no tenderness.  Lymphadenopathy:    He has no cervical adenopathy.  Neurological: He is alert and oriented to person, place, and time. He exhibits normal muscle tone.  Skin: Skin is warm and dry. No rash noted. He is not diaphoretic. No erythema. No pallor.  Psychiatric: He has a normal mood and affect. His behavior is normal. Judgment and thought content normal.    Data Reviewed My office note 12/11/12 Dr Gustavus Bryant office note 12/4 and telephone notes Dr Rosezella [place] office note 12/21/12 CXR normal Stress test - moderate LVH, nml LV function - cleared for surgery by Dr Percival Spanish +h pylori breath test UGI- small hiatal hernia, +reflux Evaluation labs 12/14/12- hgb a1c 6.4; cbc wnl; lipid panel wnl except for HDL 30; nml cmet  Assessment    Morbid obesity BMI 46.1  h/o B/l pulmonary emboli  Anticoagulated state  OSA on CPAP  H/o HTN  IDDM  OA of hips  B/l hip avascular necrosis  GERD  H/o tobacco use      Plan    I congratulated him on his weight loss. I encouraged him to keep up the good work. He has been cleared by cardiology and pulmonary for laparoscopic sleeve gastrectomy. We reviewed his workup today. I explained that we would test him for a hiatal hernia during surgery and if there is anything clinically significant we would also need to fix that at the same time in order to decrease postoperative reflux. He is 6 months out from his diagnosis  of pulmonary emboli and I think it is safe to proceed with surgery in the near future. I explained to him that obviously he is at higher risk for perioperative bleeding as well as recurrent blood clots because of his history of PE and need for aggressive anticoagulation.  I will see him back at his two-week preoperative appointment. He was given the surgical consent for laparoscopic sleeve gastrectomy to review as well as access to watch the EMMI video sleeve gastrectomy.  Leighton Ruff. Redmond Pulling, MD, FACS General, Bariatric, & Minimally Invasive Surgery [facility], [place]        [facility] 04/09/2013, 9:39 AM

## 2013-04-26 ENCOUNTER — Other Ambulatory Visit (INDEPENDENT_AMBULATORY_CARE_PROVIDER_SITE_OTHER): Payer: Self-pay | Admitting: General Surgery

## 2013-05-10 ENCOUNTER — Encounter: Payer: Medicare Other | Attending: General Surgery

## 2013-05-10 VITALS — Ht 74.0 in | Wt 363.5 lb

## 2013-05-10 DIAGNOSIS — Z6841 Body Mass Index (BMI) 40.0 and over, adult: Secondary | ICD-10-CM | POA: Insufficient documentation

## 2013-05-10 DIAGNOSIS — Z713 Dietary counseling and surveillance: Secondary | ICD-10-CM | POA: Insufficient documentation

## 2013-05-10 DIAGNOSIS — E669 Obesity, unspecified: Secondary | ICD-10-CM

## 2013-05-10 NOTE — Patient Instructions (Signed)
-  Patient to attend class 2 weeks post op

## 2013-05-10 NOTE — Progress Notes (Signed)
  Pre-Operative Nutrition Class:  Appt start time: 830   End time:  1030.  Patient was seen on 05/10/2013 for Pre-Operative Bariatric Surgery Education at the [facility].   Surgery date: 05/31/2013 Surgery type: Gastric Sleeve Start weight at [facility]: 360 lbs on 03/04/2013 Weight today: 363.5 lbs  TANITA  BODY COMP RESULTS  05/10/13   BMI (kg/m^2) 46.7   Fat Mass (lbs) 204.5   Fat Free Mass (lbs) 159   Total Body Water (lbs) 116.5   Samples given per MNT protocol. Patient educated on appropriate usage:   Hydrographic surveyor (vanilla) - Qty: 1 Lot #: [id number] Exp: 11/2013  Bariactiv Multivitamin - Qty: 1 Lot #: [id number] S Exp: 06/2014  Bariactiv Calcium Citrate - Qty: 1 Lot #: [id number] S Exp: 07/2014  Unjury Protein Powder (chocolate) - Qty:1 Lot #: [id number] Exp: 05/2014  The following the learning objectives were met by the patient during this course:  Identify Pre-Op Dietary Goals and will begin 2 weeks pre-operatively  Identify appropriate sources of fluids and proteins   State protein recommendations and appropriate sources pre and post-operatively  Identify Post-Operative Dietary Goals and will follow for 2 weeks post-operatively  Identify appropriate multivitamin and calcium sources  Describe the need for physical activity post-operatively and will follow MD recommendations  State when to call healthcare provider regarding medication questions or post-operative complications  Handouts given during class include:  Pre-Op Bariatric Surgery Diet Handout  Protein Shake Handout  Post-Op Bariatric Surgery Nutrition Handout  [facility]EL[facility] Program Information Flyer  Support Group Information Flyer  [facility] Bariatric Supplements Price List  Follow-Up Plan: Patient will follow-up at [facility] 2 weeks post operatively for diet advancement per MD.

## 2013-05-14 ENCOUNTER — Encounter (HOSPITAL_COMMUNITY): Payer: Self-pay | Admitting: Pharmacy Technician

## 2013-05-20 NOTE — Patient Instructions (Addendum)
Qu[facility] A Embry  05/20/2013                           YOUR PROCEDURE IS SCHEDULED ON: 05/31/13               PLEASE REPORT TO SHORT STAY CENTER AT : 8:45 AM               CALL THIS NUMBER IF ANY PROBLEMS THE DAY OF SURGERY :               [phone]                                REMEMBER:   Do not eat food or drink liquids AFTER MIDNIGHT                 Take these medicines the morning of surgery with A SIP OF WATER: NONE   Do not wear jewelry, make-up   Do not wear lotions, powders, or perfumes.   Do not shave legs or underarms 12 hrs. before surgery (men may shave face)  Do not bring valuables to the hospital.  Contacts, dentures or bridgework may not be worn into surgery.  Leave suitcase in the car. After surgery it may be brought to your room.  For patients admitted to the hospital more than one night, checkout time is            11:00 AM                                                       The day of discharge.   Patients discharged the day of surgery will not be allowed to drive home.            If going home same day of surgery, must have someone stay with you              FIRST 24 hrs at home and arrange for some one to drive you              home from hospital.    Special Instructions             Please read over the following fact sheets that you were given:               1. [facility] PREPARING FOR SURGERY SHEET               2. STOP ASPIRIN / HERBS / IBUPROFEN ALEVE MOTRIN ADVIL 7 DAYS PREOP               3. BRING C PAP MASK AND TUBING TO HOSPITAL                                                X_____________________________________________________________________        Failure to follow these instructions may result in cancellation of your surgery

## 2013-05-21 ENCOUNTER — Ambulatory Visit (INDEPENDENT_AMBULATORY_CARE_PROVIDER_SITE_OTHER): Payer: Medicare Other | Admitting: General Surgery

## 2013-05-21 ENCOUNTER — Encounter (HOSPITAL_COMMUNITY)
Admission: RE | Admit: 2013-05-21 | Discharge: 2013-05-21 | Disposition: A | Discharge status: Home / Self Care | Payer: Medicare Other | Source: Ambulatory Visit | Attending: General Surgery | Admitting: General Surgery

## 2013-05-21 ENCOUNTER — Encounter (INDEPENDENT_AMBULATORY_CARE_PROVIDER_SITE_OTHER): Payer: Self-pay | Admitting: General Surgery

## 2013-05-21 ENCOUNTER — Encounter (HOSPITAL_COMMUNITY): Payer: Self-pay

## 2013-05-21 VITALS — BP 132/78 | HR 92 | Temp 98.0°F | Resp 20 | Ht 72.0 in | Wt 357.2 lb

## 2013-05-21 DIAGNOSIS — Z01812 Encounter for preprocedural laboratory examination: Secondary | ICD-10-CM | POA: Insufficient documentation

## 2013-05-21 DIAGNOSIS — R0602 Shortness of breath: Secondary | ICD-10-CM

## 2013-05-21 HISTORY — DX: Shortness of breath: R06.02

## 2013-05-21 HISTORY — DX: Cardiac arrhythmia, unspecified: I49.9

## 2013-05-21 LAB — COMPREHENSIVE METABOLIC PANEL
ALT: 34 U/L (ref 0–53)
AST: 32 U/L (ref 0–37)
Albumin: 3.8 g/dL (ref 3.5–5.2)
Alkaline Phosphatase: 65 U/L (ref 39–117)
BUN: 17 mg/dL (ref 6–23)
CO2: 26 mEq/L (ref 19–32)
Calcium: 9.3 mg/dL (ref 8.4–10.5)
Chloride: 101 mEq/L (ref 96–112)
Creatinine, Ser: 0.96 mg/dL (ref 0.50–1.35)
GFR calc Af Amer: >90 mL/min (ref >90)
GFR calc non Af Amer: >90 mL/min (ref >90)
Glucose, Bld: 96 mg/dL (ref 70–99)
Potassium: 4.3 mEq/L (ref 3.7–5.3)
Sodium: 140 mEq/L (ref 137–147)
Total Bilirubin: 0.5 mg/dL (ref 0.3–1.2)
Total Protein: 7.3 g/dL (ref 6.0–8.3)

## 2013-05-21 LAB — CBC WITH DIFFERENTIAL/PLATELET
Basophils Absolute: 0 10*3/uL (ref 0.0–0.1)
Basophils Relative: 0 % (ref 0–1)
Eosinophils Absolute: 0.1 10*3/uL (ref 0.0–0.7)
Eosinophils Relative: 1 % (ref 0–5)
HCT: 44.1 % (ref 39.0–52.0)
Hemoglobin: 14.9 g/dL (ref 13.0–17.0)
Lymphocytes Relative: 39 % (ref 12–46)
Lymphs Abs: 2.5 10*3/uL (ref 0.7–4.0)
MCH: 30.8 pg (ref 26.0–34.0)
MCHC: 33.8 g/dL (ref 30.0–36.0)
MCV: 91.3 fL (ref 78.0–100.0)
Monocytes Absolute: 0.7 10*3/uL (ref 0.1–1.0)
Monocytes Relative: 11 % (ref 3–12)
Neutro Abs: 3.1 10*3/uL (ref 1.7–7.7)
Neutrophils Relative %: 48 % (ref 43–77)
Platelets: 276 10*3/uL (ref 150–400)
RBC: 4.83 MIL/uL (ref 4.22–5.81)
RDW: 12.8 % (ref 11.5–15.5)
WBC: 6.4 10*3/uL (ref 4.0–10.5)

## 2013-05-21 NOTE — Progress Notes (Signed)
Patient ID: Phillip Frye, male   DOB: 08/15/1970, 43 y.o.   MRN: [id number]  Chief Complaint  Patient presents with  . Bariatric Pre-op    gastric sleeve 05/31/13    HPI Phillip Frye is a 43 y.o. male.   HPI 43 year old morbidly obese African American male comes in For his preoperative appointment. He is currently scheduled for laparoscopic sleeve gastrectomy on April 20.. I initially met him on 12/11/2012. At that time his weight was 369 pounds. I last saw him in the office on February 27 at which time his weight was 359 pounds. He was diagnosed with bilateral pulmonary emboli in August 2014 and stopped xarelto 3 weeks ago. He denies any new changes since he was last seen. He denies any abdominal pain. He is no longer taking his oral anticoagulation. He states he has lost 6 pounds through better food choices since starting his preop diet. He is using CPAP. He is not taking any meds for GERD right now. He states that he took the Prevpac for his H. Pylori positive test. He states he watched the EMMI video sleeve gastrectomy  Past Medical History  Diagnosis Date  . Sleep apnea   . Hypertension   . Arthritis   . Obesity   . Diabetes mellitus without complication   . 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 GERD (gastroesophageal reflux disease)     Past Surgical History  Procedure Laterality Date  . Hip surgery Bilateral   . Breath tek h pylori N/A 12/28/2012    Procedure: BREATH TEK H PYLORI;  Lender: Gayland Curry, MD;  Location: [facility];  Service: General;  Laterality: N/A;    Family History  Problem Relation Age of Onset  . Cirrhosis Father   . Diabetes Other   . Diabetes Other   . Emphysema Paternal Grandfather     smoked    Social History History  Substance Use Topics  . Smoking status: Former Smoker -- 0.05 packs/day for 6 years    Types: Cigars    Quit date: 10/15/2012  . Smokeless tobacco: Never Used  . Alcohol Use: Yes     Comment:  occasional    No Known Allergies  Current Outpatient Prescriptions  Medication Sig Dispense Refill  . calcium-vitamin D (OSCAL WITH D) 500-200 MG-UNIT per tablet Take 1 tablet by mouth daily.       . fluticasone (FLONASE) 50 MCG/ACT nasal spray Place 1 spray into both nostrils daily.      . Liraglutide (VICTOZA) 18 MG/3ML SOPN Inject 1.8 Units into the skin daily.       . sitaGLIPtin (JANUVIA) 25 MG tablet Take 25 mg by mouth daily.      . [DISCONTINUED] metoprolol (LOPRESSOR) 50 MG tablet Take 50 mg by mouth 2 (two) times daily.         No current facility-administered medications for this visit.    Review of Systems Review of Systems  Constitutional: Negative for fever, chills, appetite change and unexpected weight change.  HENT: Negative for congestion and trouble swallowing.   Eyes: Negative for visual disturbance.  Respiratory: Negative for chest tightness and shortness of breath.        +OSA on cpap  Cardiovascular: Negative for chest pain and leg swelling.       No PND, no orthopnea, some DOE; h/o HTN - no longer on BP meds. Had stress test in November - see below;   Gastrointestinal:  Some occassional reflux, used to take nexium  Genitourinary: Negative for dysuria and hematuria.  Musculoskeletal: Negative.        Needs hip replacement; b/l hip avascular necrosis  Skin: Negative for rash.  Neurological: Negative for seizures and speech difficulty.  Hematological: Does not bruise/bleed easily.       On xarelto for b/l in august 2014. Cards rec continuing xarelto until BMI <30 since obesity was inciting factor for PE.   Psychiatric/Behavioral: Negative for behavioral problems and confusion.    Blood pressure 132/78, pulse 92, temperature 98 F (36.7 C), temperature source Oral, resp. rate 20, height 6' (1.829 m), weight 357 lb 3.2 oz (162.025 kg).  Physical Exam Physical Exam  Vitals reviewed. Constitutional: He is oriented to person, place, and time. He appears  well-developed and well-nourished. No distress.  Morbidly obese  HENT:  Head: Normocephalic and atraumatic.  Right Ear: External ear normal.  Left Ear: External ear normal.  Eyes: Conjunctivae are normal. No scleral icterus.  Neck: Normal range of motion. Neck supple. No tracheal deviation present. No thyromegaly present.  Cardiovascular: Normal rate, normal heart sounds and intact distal pulses.   Pulmonary/Chest: Effort normal and breath sounds normal. No respiratory distress. He has no wheezes.  Abdominal: Soft. He exhibits no distension. There is no tenderness. There is no rebound.  Tattoo lower abdomen  Musculoskeletal: Normal range of motion. He exhibits no edema and no tenderness.  Lymphadenopathy:    He has no cervical adenopathy.  Neurological: He is alert and oriented to person, place, and time. He exhibits normal muscle tone.  Skin: Skin is warm and dry. No rash noted. He is not diaphoretic. No erythema. No pallor.  Psychiatric: He has a normal mood and affect. His behavior is normal. Judgment and thought content normal.    Data Reviewed My office note 12/11/12, 2.27 Dr Gustavus Bryant office note 12/4 and telephone notes Dr Rosezella [place] office note 12/21/12 CXR normal Stress test - moderate LVH, nml LV function - cleared for surgery by Dr Percival Spanish +h pylori breath test UGI- small hiatal hernia, +reflux Evaluation labs 12/14/12- hgb a1c 6.4; cbc wnl; lipid panel wnl except for HDL 30; nml cmet  Assessment    Morbid obesity BMI 48.44 h/o B/l pulmonary emboli  H/o Anticoagulated state  OSA on CPAP  H/o HTN  IDDM  OA of hips  B/l hip avascular necrosis  GERD - no meds H/o tobacco use      Plan    It looks like everything is set for him to undergo surgery on the 20th. He was reminded that he should be on a clear liquid diet the day before surgery. We discussed the importance of compliance with the preoperative diet. I did remind him that he will be going home on Lovenox  injections because of his history of a lateral pulmonary emboli. He was reminded to bring his CPAP mask to the hospital. We discussed the importance of being active over the next week and a half. All of his questions were asked and answered.  Leighton Ruff. Redmond Pulling, MD, FACS General, Bariatric, & Minimally Invasive Surgery [facility] [place]        Gayland Curry 05/21/2013, 11:13 AM

## 2013-05-21 NOTE — Patient Instructions (Signed)
  Two weeks prior to surgery  Go on the extremely low carb liquid diet - this will decrease the size of your liver  which will make surgery safer - the nutritionist will go over this at a later date  Attend preoperative appointment with your Reine  Attend preoperative surgery class  One week prior to surgery  No aspirin products.  Tylenol is acceptable   24 hours prior to surgery  No alcoholic beverages  Report fever greater than 100.5 or excessive nasal drainage suggesting infection  Continue bariatric preop diet  Clears or full liquids only today  Do not eat or drink anything after midnight the night before surgery  Do not take any medications except those instructed by the anesthesiologist  Morning of surgery  Please arrive at the hospital at least 2 hours before your scheduled surgery time.  No makeup, fingernail polish or jewelry  Bring insurance cards with you  Bring your CPAP mask if you use this

## 2013-05-21 NOTE — Progress Notes (Signed)
Portable equip notified of need for Beri Bed

## 2013-05-31 ENCOUNTER — Encounter (HOSPITAL_COMMUNITY): Payer: Self-pay

## 2013-05-31 ENCOUNTER — Inpatient Hospital Stay (HOSPITAL_COMMUNITY)
Admission: RE | Admit: 2013-05-31 | Discharge: 2013-06-02 | DRG: 620 | Disposition: A | Discharge status: Home / Self Care | Payer: Medicare Other | Source: Ambulatory Visit | Attending: General Surgery | Admitting: General Surgery

## 2013-05-31 ENCOUNTER — Encounter (HOSPITAL_COMMUNITY)
Admission: RE | Disposition: A | Discharge status: Home / Self Care | Payer: Self-pay | Source: Ambulatory Visit | Attending: General Surgery

## 2013-05-31 ENCOUNTER — Encounter (HOSPITAL_COMMUNITY): Payer: Medicare Other | Admitting: Anesthesiology

## 2013-05-31 ENCOUNTER — Inpatient Hospital Stay (HOSPITAL_COMMUNITY): Payer: Medicare Other | Admitting: Anesthesiology

## 2013-05-31 DIAGNOSIS — Z794 Long term (current) use of insulin: Secondary | ICD-10-CM

## 2013-05-31 DIAGNOSIS — Z833 Family history of diabetes mellitus: Secondary | ICD-10-CM

## 2013-05-31 DIAGNOSIS — Z6841 Body Mass Index (BMI) 40.0 and over, adult: Secondary | ICD-10-CM

## 2013-05-31 DIAGNOSIS — E119 Type 2 diabetes mellitus without complications: Secondary | ICD-10-CM | POA: Diagnosis present

## 2013-05-31 DIAGNOSIS — G4733 Obstructive sleep apnea (adult) (pediatric): Secondary | ICD-10-CM | POA: Diagnosis present

## 2013-05-31 DIAGNOSIS — K449 Diaphragmatic hernia without obstruction or gangrene: Secondary | ICD-10-CM | POA: Diagnosis present

## 2013-05-31 DIAGNOSIS — I10 Essential (primary) hypertension: Secondary | ICD-10-CM | POA: Diagnosis present

## 2013-05-31 DIAGNOSIS — K219 Gastro-esophageal reflux disease without esophagitis: Secondary | ICD-10-CM | POA: Diagnosis present

## 2013-05-31 DIAGNOSIS — M87059 Idiopathic aseptic necrosis of unspecified femur: Secondary | ICD-10-CM | POA: Diagnosis present

## 2013-05-31 DIAGNOSIS — Z9884 Bariatric surgery status: Secondary | ICD-10-CM

## 2013-05-31 DIAGNOSIS — Z01812 Encounter for preprocedural laboratory examination: Secondary | ICD-10-CM

## 2013-05-31 DIAGNOSIS — M1610 Unilateral primary osteoarthritis, unspecified hip: Secondary | ICD-10-CM | POA: Diagnosis present

## 2013-05-31 DIAGNOSIS — Z87891 Personal history of nicotine dependence: Secondary | ICD-10-CM

## 2013-05-31 DIAGNOSIS — G4730 Sleep apnea, unspecified: Secondary | ICD-10-CM | POA: Diagnosis present

## 2013-05-31 DIAGNOSIS — Z86711 Personal history of pulmonary embolism: Secondary | ICD-10-CM

## 2013-05-31 DIAGNOSIS — M169 Osteoarthritis of hip, unspecified: Secondary | ICD-10-CM | POA: Diagnosis present

## 2013-05-31 DIAGNOSIS — Z79899 Other long term (current) drug therapy: Secondary | ICD-10-CM

## 2013-05-31 HISTORY — PX: LAPAROSCOPIC GASTRIC SLEEVE RESECTION: SHX5895

## 2013-05-31 LAB — GLUCOSE, CAPILLARY
Glucose-Capillary: 124 mg/dL — ABNORMAL HIGH (ref 70–99)
Glucose-Capillary: 137 mg/dL — ABNORMAL HIGH (ref 70–99)
Glucose-Capillary: 139 mg/dL — ABNORMAL HIGH (ref 70–99)
Glucose-Capillary: 144 mg/dL — ABNORMAL HIGH (ref 70–99)
Glucose-Capillary: 98 mg/dL (ref 70–99)

## 2013-05-31 LAB — HEMOGLOBIN AND HEMATOCRIT, BLOOD
HCT: 44.2 % (ref 39.0–52.0)
Hemoglobin: 15 g/dL (ref 13.0–17.0)

## 2013-05-31 SURGERY — GASTRECTOMY, SLEEVE, LAPAROSCOPIC
Anesthesia: General | Site: Abdomen

## 2013-05-31 MED ORDER — MORPHINE SULFATE 2 MG/ML IJ SOLN
2.0000 mg | INTRAMUSCULAR | Status: DC | PRN
  Administered 2013-05-31 (×2): 6 mg via INTRAVENOUS
  Administered 2013-05-31: 4 mg via INTRAVENOUS
  Administered 2013-06-01: 6 mg via INTRAVENOUS
  Administered 2013-06-01: 4 mg via INTRAVENOUS
  Administered 2013-06-01 (×2): 6 mg via INTRAVENOUS
  Administered 2013-06-01: 4 mg via INTRAVENOUS
  Administered 2013-06-01: 6 mg via INTRAVENOUS
  Filled 2013-05-31: qty 3
  Filled 2013-05-31 (×3): qty 2
  Filled 2013-05-31 (×2): qty 3
  Filled 2013-05-31: qty 2
  Filled 2013-05-31 (×3): qty 3

## 2013-05-31 MED ORDER — UNJURY CHICKEN SOUP POWDER: 2.0000 [oz_av] | Freq: Four times a day (QID) | ORAL | Status: DC

## 2013-05-31 MED ORDER — HEPARIN SODIUM (PORCINE) 5000 UNIT/ML IJ SOLN
5000.0000 [IU] | INTRAMUSCULAR | Status: AC
  Administered 2013-05-31: 5000 [IU] via SUBCUTANEOUS
  Filled 2013-05-31: qty 1

## 2013-05-31 MED ORDER — BUPIVACAINE-EPINEPHRINE 0.25% -1:200000 IJ SOLN
INTRAMUSCULAR | Status: AC
  Filled 2013-05-31: qty 1

## 2013-05-31 MED ORDER — ACETAMINOPHEN 160 MG/5ML PO SOLN
650.0000 mg | ORAL | Status: DC | PRN
  Administered 2013-06-02: 650 mg via ORAL
  Filled 2013-05-31: qty 20.3

## 2013-05-31 MED ORDER — 0.9 % SODIUM CHLORIDE (POUR BTL) OPTIME
TOPICAL | Status: DC | PRN
  Administered 2013-05-31: 1000 mL

## 2013-05-31 MED ORDER — GLYCOPYRROLATE 0.2 MG/ML IJ SOLN
INTRAMUSCULAR | Status: AC
  Filled 2013-05-31: qty 3

## 2013-05-31 MED ORDER — DEXTROSE 5 % IV SOLN
INTRAVENOUS | Status: AC
  Filled 2013-05-31: qty 2

## 2013-05-31 MED ORDER — FENTANYL CITRATE 0.05 MG/ML IJ SOLN
INTRAMUSCULAR | Status: AC
  Filled 2013-05-31: qty 2

## 2013-05-31 MED ORDER — KETAMINE HCL 10 MG/ML IJ SOLN
INTRAMUSCULAR | Status: DC | PRN
  Administered 2013-05-31: 30 mg via INTRAVENOUS

## 2013-05-31 MED ORDER — HYDROMORPHONE HCL PF 1 MG/ML IJ SOLN
INTRAMUSCULAR | Status: AC
  Filled 2013-05-31: qty 1

## 2013-05-31 MED ORDER — UNJURY VANILLA POWDER: 2.0000 [oz_av] | Freq: Four times a day (QID) | ORAL | Status: DC

## 2013-05-31 MED ORDER — FENTANYL CITRATE 0.05 MG/ML IJ SOLN
INTRAMUSCULAR | Status: DC | PRN
  Administered 2013-05-31 (×2): 50 ug via INTRAVENOUS
  Administered 2013-05-31: 100 ug via INTRAVENOUS
  Administered 2013-05-31 (×3): 50 ug via INTRAVENOUS

## 2013-05-31 MED ORDER — SUCCINYLCHOLINE CHLORIDE 20 MG/ML IJ SOLN
INTRAMUSCULAR | Status: DC | PRN
  Administered 2013-05-31: 200 mg via INTRAVENOUS

## 2013-05-31 MED ORDER — ENOXAPARIN SODIUM 40 MG/0.4ML ~~LOC~~ SOLN
40.0000 mg | Freq: Two times a day (BID) | SUBCUTANEOUS | Status: DC
  Administered 2013-06-01 – 2013-06-02 (×3): 40 mg via SUBCUTANEOUS
  Filled 2013-05-31 (×5): qty 0.4

## 2013-05-31 MED ORDER — ACETAMINOPHEN 160 MG/5ML PO SOLN: 325.0000 mg | ORAL | Status: DC | PRN

## 2013-05-31 MED ORDER — METOPROLOL TARTRATE 1 MG/ML IV SOLN
INTRAVENOUS | Status: DC | PRN
  Administered 2013-05-31 (×2): 1 mg via INTRAVENOUS

## 2013-05-31 MED ORDER — METOPROLOL TARTRATE 1 MG/ML IV SOLN
INTRAVENOUS | Status: AC
  Filled 2013-05-31: qty 5

## 2013-05-31 MED ORDER — UNJURY CHOCOLATE CLASSIC POWDER
2.0000 [oz_av] | Freq: Four times a day (QID) | ORAL | Status: DC
  Administered 2013-06-02: 2 [oz_av] via ORAL

## 2013-05-31 MED ORDER — PROPOFOL 10 MG/ML IV BOLUS
INTRAVENOUS | Status: DC | PRN
  Administered 2013-05-31: 350 mg via INTRAVENOUS

## 2013-05-31 MED ORDER — DEXAMETHASONE SODIUM PHOSPHATE 4 MG/ML IJ SOLN
INTRAMUSCULAR | Status: DC | PRN
  Administered 2013-05-31: 10 mg via INTRAVENOUS

## 2013-05-31 MED ORDER — CHLORHEXIDINE GLUCONATE 4 % EX LIQD: 60.0000 mL | Freq: Once | CUTANEOUS | Status: DC

## 2013-05-31 MED ORDER — HYDROMORPHONE HCL PF 1 MG/ML IJ SOLN
0.2500 mg | INTRAMUSCULAR | Status: DC | PRN
  Administered 2013-05-31 (×4): 0.5 mg via INTRAVENOUS

## 2013-05-31 MED ORDER — BUPIVACAINE-EPINEPHRINE 0.25% -1:200000 IJ SOLN
INTRAMUSCULAR | Status: DC | PRN
  Administered 2013-05-31: 31 mL

## 2013-05-31 MED ORDER — INSULIN ASPART 100 UNIT/ML ~~LOC~~ SOLN
0.0000 [IU] | SUBCUTANEOUS | Status: DC
  Administered 2013-05-31 – 2013-06-01 (×3): 3 [IU] via SUBCUTANEOUS

## 2013-05-31 MED ORDER — ONDANSETRON HCL 4 MG/2ML IJ SOLN
4.0000 mg | INTRAMUSCULAR | Status: DC | PRN
  Administered 2013-05-31 – 2013-06-01 (×3): 4 mg via INTRAVENOUS
  Filled 2013-05-31 (×3): qty 2

## 2013-05-31 MED ORDER — LACTATED RINGERS IV SOLN
INTRAVENOUS | Status: DC
  Administered 2013-05-31: 1000 mL via INTRAVENOUS
  Administered 2013-05-31: 13:00:00 via INTRAVENOUS

## 2013-05-31 MED ORDER — PROMETHAZINE HCL 25 MG/ML IJ SOLN: 6.2500 mg | INTRAMUSCULAR | Status: DC | PRN

## 2013-05-31 MED ORDER — ROCURONIUM BROMIDE 100 MG/10ML IV SOLN
INTRAVENOUS | Status: AC
  Filled 2013-05-31: qty 1

## 2013-05-31 MED ORDER — ONDANSETRON HCL 4 MG/2ML IJ SOLN
INTRAMUSCULAR | Status: DC | PRN
  Administered 2013-05-31: 4 mg via INTRAVENOUS

## 2013-05-31 MED ORDER — PROPOFOL 10 MG/ML IV BOLUS
INTRAVENOUS | Status: AC
Start: 2013-05-31 — End: 2013-05-31
  Filled 2013-05-31: qty 20

## 2013-05-31 MED ORDER — HYDROMORPHONE HCL PF 2 MG/ML IJ SOLN
INTRAMUSCULAR | Status: AC
  Filled 2013-05-31: qty 1

## 2013-05-31 MED ORDER — LACTATED RINGERS IR SOLN
Status: DC | PRN
Start: 2013-05-31 — End: 2013-05-31
  Administered 2013-05-31: 3000 mL

## 2013-05-31 MED ORDER — ACETAMINOPHEN 10 MG/ML IV SOLN
1000.0000 mg | Freq: Four times a day (QID) | INTRAVENOUS | Status: AC
  Administered 2013-05-31 – 2013-06-01 (×4): 1000 mg via INTRAVENOUS
  Filled 2013-05-31 (×4): qty 100

## 2013-05-31 MED ORDER — DEXAMETHASONE SODIUM PHOSPHATE 10 MG/ML IJ SOLN
INTRAMUSCULAR | Status: AC
  Filled 2013-05-31: qty 1

## 2013-05-31 MED ORDER — HYDROMORPHONE HCL PF 1 MG/ML IJ SOLN
INTRAMUSCULAR | Status: DC | PRN
  Administered 2013-05-31: 0.5 mg via INTRAVENOUS
  Administered 2013-05-31: 1 mg via INTRAVENOUS
  Administered 2013-05-31: 0.5 mg via INTRAVENOUS

## 2013-05-31 MED ORDER — OXYCODONE HCL 5 MG/5ML PO SOLN
5.0000 mg | ORAL | Status: DC | PRN
  Administered 2013-06-01: 10 mg via ORAL
  Filled 2013-05-31: qty 10

## 2013-05-31 MED ORDER — NEOSTIGMINE METHYLSULFATE 1 MG/ML IJ SOLN
INTRAMUSCULAR | Status: DC | PRN
  Administered 2013-05-31: 5 mg via INTRAVENOUS

## 2013-05-31 MED ORDER — FENTANYL CITRATE 0.05 MG/ML IJ SOLN
INTRAMUSCULAR | Status: AC
  Filled 2013-05-31: qty 5

## 2013-05-31 MED ORDER — GLYCOPYRROLATE 0.2 MG/ML IJ SOLN
INTRAMUSCULAR | Status: DC | PRN
  Administered 2013-05-31: 0.6 mg via INTRAVENOUS

## 2013-05-31 MED ORDER — PHENYLEPHRINE HCL 10 MG/ML IJ SOLN
INTRAMUSCULAR | Status: DC | PRN
  Administered 2013-05-31 (×2): 80 ug via INTRAVENOUS

## 2013-05-31 MED ORDER — LIDOCAINE HCL (CARDIAC) 20 MG/ML IV SOLN
INTRAVENOUS | Status: AC
  Filled 2013-05-31: qty 5

## 2013-05-31 MED ORDER — MIDAZOLAM HCL 5 MG/5ML IJ SOLN
INTRAMUSCULAR | Status: DC | PRN
  Administered 2013-05-31: 2 mg via INTRAVENOUS

## 2013-05-31 MED ORDER — TISSEEL VH 10 ML EX KIT
PACK | CUTANEOUS | Status: AC
  Filled 2013-05-31: qty 2

## 2013-05-31 MED ORDER — MIDAZOLAM HCL 2 MG/2ML IJ SOLN
INTRAMUSCULAR | Status: AC
  Filled 2013-05-31: qty 2

## 2013-05-31 MED ORDER — DEXTROSE 5 % IV SOLN: 2.0000 g | Freq: Once | INTRAVENOUS | Status: DC

## 2013-05-31 MED ORDER — PROPOFOL 10 MG/ML IV BOLUS
INTRAVENOUS | Status: AC
  Filled 2013-05-31: qty 20

## 2013-05-31 MED ORDER — CEFOXITIN SODIUM 2 G IV SOLR
2.0000 g | INTRAVENOUS | Status: AC
  Administered 2013-05-31 (×2): 2 g via INTRAVENOUS

## 2013-05-31 MED ORDER — ONDANSETRON HCL 4 MG/2ML IJ SOLN
INTRAMUSCULAR | Status: AC
  Filled 2013-05-31: qty 2

## 2013-05-31 MED ORDER — POTASSIUM CHLORIDE IN NACL 20-0.45 MEQ/L-% IV SOLN
INTRAVENOUS | Status: DC
  Administered 2013-05-31 – 2013-06-01 (×2): via INTRAVENOUS
  Filled 2013-05-31 (×5): qty 1000

## 2013-05-31 MED ORDER — ROCURONIUM BROMIDE 100 MG/10ML IV SOLN
INTRAVENOUS | Status: DC | PRN
  Administered 2013-05-31: 10 mg via INTRAVENOUS
  Administered 2013-05-31: 5 mg via INTRAVENOUS
  Administered 2013-05-31: 10 mg via INTRAVENOUS
  Administered 2013-05-31: 40 mg via INTRAVENOUS

## 2013-05-31 MED ORDER — NEOSTIGMINE METHYLSULFATE 1 MG/ML IJ SOLN
INTRAMUSCULAR | Status: AC
  Filled 2013-05-31: qty 10

## 2013-05-31 MED ORDER — PANTOPRAZOLE SODIUM 40 MG IV SOLR
40.0000 mg | INTRAVENOUS | Status: DC
  Administered 2013-05-31 – 2013-06-01 (×2): 40 mg via INTRAVENOUS
  Filled 2013-05-31 (×3): qty 40

## 2013-05-31 MED ORDER — TISSEEL VH 10 ML EX KIT
PACK | CUTANEOUS | Status: DC | PRN
  Administered 2013-05-31: 2

## 2013-05-31 SURGICAL SUPPLY — 66 items
ADH SKN CLS APL DERMABOND .7 (GAUZE/BANDAGES/DRESSINGS) ×2
APL SRG 32X5 SNPLK LF DISP (MISCELLANEOUS) ×1
APPLICATOR COTTON TIP 6IN STRL (MISCELLANEOUS) IMPLANT
APPLIER CLIP ROT 10 11.4 M/L (STAPLE)
APPLIER CLIP ROT 13.4 12 LRG (CLIP) ×2
APR CLP LRG 13.4X12 ROT 20 MLT (CLIP) ×1
APR CLP MED LRG 11.4X10 (STAPLE)
BAG SPEC RTRVL LRG 6X4 10 (ENDOMECHANICALS)
BLADE SURG SZ11 CARB STEEL (BLADE) ×2 IMPLANT
CABLE HIGH FREQUENCY MONO STRZ (ELECTRODE) IMPLANT
CANISTER SUCTION 2500CC (MISCELLANEOUS) ×2 IMPLANT
CHLORAPREP W/TINT 26ML (MISCELLANEOUS) ×4 IMPLANT
CLIP APPLIE ROT 10 11.4 M/L (STAPLE) IMPLANT
CLIP APPLIE ROT 13.4 12 LRG (CLIP) IMPLANT
DERMABOND ADVANCED (GAUZE/BANDAGES/DRESSINGS) ×2
DERMABOND ADVANCED .7 DNX12 (GAUZE/BANDAGES/DRESSINGS) IMPLANT
DEVICE SUT QUICK LOAD TK 5 (STAPLE) IMPLANT
DEVICE SUT TI-KNOT TK 5X26 (MISCELLANEOUS) IMPLANT
DEVICE SUTURE ENDOST 10MM (ENDOMECHANICALS) IMPLANT
DEVICE TROCAR PUNCTURE CLOSURE (ENDOMECHANICALS) ×1 IMPLANT
DISSECTOR BLUNT TIP ENDO 5MM (MISCELLANEOUS) IMPLANT
DRAPE CAMERA CLOSED 9X96 (DRAPES) ×2 IMPLANT
DRAPE UTILITY XL STRL (DRAPES) ×4 IMPLANT
ELECT REM PT RETURN 9FT ADLT (ELECTROSURGICAL) ×2
ELECTRODE REM PT RTRN 9FT ADLT (ELECTROSURGICAL) ×1 IMPLANT
GLOVE BIOGEL M STRL SZ7.5 (GLOVE) ×2 IMPLANT
GOWN STRL REUS W/TWL XL LVL3 (GOWN DISPOSABLE) ×7 IMPLANT
HOVERMATT SINGLE USE (MISCELLANEOUS) ×2 IMPLANT
KIT BASIN OR (CUSTOM PROCEDURE TRAY) ×2 IMPLANT
MARKER SKIN DUAL TIP RULER LAB (MISCELLANEOUS) ×2 IMPLANT
NDL SPNL 22GX3.5 QUINCKE BK (NEEDLE) ×1 IMPLANT
NEEDLE SPNL 22GX3.5 QUINCKE BK (NEEDLE) ×2 IMPLANT
NS IRRIG 1000ML POUR BTL (IV SOLUTION) ×2 IMPLANT
PACK UNIVERSAL I (CUSTOM PROCEDURE TRAY) ×2 IMPLANT
PENCIL BUTTON HOLSTER BLD 10FT (ELECTRODE) ×2 IMPLANT
POUCH SPECIMEN RETRIEVAL 10MM (ENDOMECHANICALS) IMPLANT
RELOAD BLUE (STAPLE) ×4 IMPLANT
RELOAD GOLD (STAPLE) ×6 IMPLANT
RELOAD GREEN (STAPLE) ×3 IMPLANT
SCISSORS LAP 5X35 DISP (ENDOMECHANICALS) IMPLANT
SCISSORS LAP 5X45 EPIX DISP (ENDOMECHANICALS) ×2 IMPLANT
SEALANT SURGICAL APPL DUAL CAN (MISCELLANEOUS) ×2 IMPLANT
SET IRRIG TUBING LAPAROSCOPIC (IRRIGATION / IRRIGATOR) ×2 IMPLANT
SHEARS CURVED HARMONIC AC 45CM (MISCELLANEOUS) ×2 IMPLANT
SLEEVE GASTRECTOMY 36FR VISIGI (MISCELLANEOUS) ×2 IMPLANT
SLEEVE XCEL OPT CAN 5 100 (ENDOMECHANICALS) ×6 IMPLANT
SOLUTION ANTI FOG 6CC (MISCELLANEOUS) ×2 IMPLANT
SPONGE GAUZE 4X4 12PLY (GAUZE/BANDAGES/DRESSINGS) IMPLANT
STAPLE ECHEON FLEX 60 POW ENDO (STAPLE) ×2 IMPLANT
SUT MNCRL AB 4-0 PS2 18 (SUTURE) ×3 IMPLANT
SUT SURGIDAC NAB ES-9 0 48 120 (SUTURE) IMPLANT
SUT VIC AB 2-0 UR6 27 (SUTURE) ×1 IMPLANT
SUT VICRYL 0 UR6 27IN ABS (SUTURE) ×4 IMPLANT
SYR 20CC LL (SYRINGE) ×4 IMPLANT
SYR 50ML LL SCALE MARK (SYRINGE) ×2 IMPLANT
TOWEL OR NON WOVEN STRL DISP B (DISPOSABLE) ×2 IMPLANT
TRAY FOLEY CATH 14FRSI W/METER (CATHETERS) IMPLANT
TRAY FOLEY CATH 16FRSI W/METER (SET/KITS/TRAYS/PACK) ×1 IMPLANT
TROCAR BLADELESS 15MM (ENDOMECHANICALS) ×1 IMPLANT
TROCAR BLADELESS OPT 5 100 (ENDOMECHANICALS) ×2 IMPLANT
TROCAR ENDOPATH XCEL 12X100 BL (ENDOMECHANICALS) ×1 IMPLANT
TROCAR UNIVERSAL OPT 12M 100M (ENDOMECHANICALS) IMPLANT
TROCAR XCEL 12X100 BLDLESS (ENDOMECHANICALS) ×2 IMPLANT
TUBING CONNECTING 10 (TUBING) ×2 IMPLANT
TUBING ENDO SMARTCAP (MISCELLANEOUS) ×2 IMPLANT
TUBING FILTER THERMOFLATOR (ELECTROSURGICAL) ×2 IMPLANT

## 2013-05-31 NOTE — Transfer of Care (Signed)
Immediate Anesthesia Transfer of Care Note  Patient: Phillip Frye  Procedure(s) Performed: Procedure(s): LAPAROSCOPIC GASTRIC SLEEVE RESECTION (N/A)  Patient Location: PACU  Anesthesia Type:General  Level of Consciousness: Patient easily awoken, sedated, comfortable, cooperative, following commands, responds to stimulation.   Airway & Oxygen Therapy: Patient spontaneously breathing, ventilating well, oxygen via simple oxygen mask.  Post-op Assessment: Report given to PACU RN, vital signs reviewed and stable, moving all extremities.   Post vital signs: Reviewed and stable.  Complications: No apparent anesthesia complications

## 2013-05-31 NOTE — Interval H&P Note (Signed)
History and Physical Interval Note:  05/31/2013 10:32 AM  Phillip Frye  has presented today for surgery, with the diagnosis of morbid obesity  The various methods of treatment have been discussed with the patient and family. After consideration of risks, benefits and other options for treatment, the patient has consented to  Procedure(s): LAPAROSCOPIC GASTRIC SLEEVE RESECTION (N/A) as a surgical intervention .  The patient's history has been reviewed, patient examined, no change in status, stable for surgery.  I have reviewed the patient's chart and labs.  Questions were answered to the patient's satisfaction.    Mary SellaEric M. Andrey CampanileWilson, MD, FACS General, Bariatric, & Minimally Invasive Surgery [facility] [place]   Phillip Frye

## 2013-05-31 NOTE — Anesthesia Preprocedure Evaluation (Signed)
Anesthesia Evaluation  Patient identified by MRN, date of birth, ID band Patient awake    Reviewed: Allergy & Precautions, H&P , NPO status , Patient's Chart, lab work & pertinent test results  Airway Mallampati: II TM Distance: <3 FB Neck ROM: Full    Dental no notable dental hx.    Pulmonary sleep apnea , former smoker,  breath sounds clear to auscultation  Pulmonary exam normal       Cardiovascular hypertension, Pt. on medications Rhythm:Regular Rate:Normal     Neuro/Psych negative neurological ROS  negative psych ROS   GI/Hepatic negative GI ROS, Neg liver ROS,   Endo/Other  diabetesMorbid obesity  Renal/GU negative Renal ROS  negative genitourinary   Musculoskeletal negative musculoskeletal ROS (+)   Abdominal   Peds negative pediatric ROS (+)  Hematology negative hematology ROS (+)   Anesthesia Other Findings   Reproductive/Obstetrics negative OB ROS                           Anesthesia Physical Anesthesia Plan  ASA: III  Anesthesia Plan: General   Post-op Pain Management:    Induction: Intravenous  Airway Management Planned: Oral ETT  Additional Equipment:   Intra-op Plan:   Post-operative Plan: Extubation in OR  Informed Consent: I have reviewed the patients History and Physical, chart, labs and discussed the procedure including the risks, benefits and alternatives for the proposed anesthesia with the patient or authorized representative who has indicated his/her understanding and acceptance.   Dental advisory given  Plan Discussed with: CRNA and Straughter  Anesthesia Plan Comments:         Anesthesia Quick Evaluation

## 2013-05-31 NOTE — Op Note (Signed)
05/31/2013 Phillip Frye 1970/09/24 [id number]   PRE-OPERATIVE DIAGNOSIS:   Morbid obesity BMI 45  h/o B/l pulmonary emboli  OSA on CPAP  H/o HTN  IDDM  OA of hips  B/l hip avascular necrosis  GERD - no meds   POST-OPERATIVE DIAGNOSIS:  same  PROCEDURE:  Procedure(s): LAPAROSCOPIC SLEEVE GASTRECTOMY UPPER GI ENDOSCOPY  Deer:  Arocho(s): Gayland Curry, MD FACS  ASSISTANTS: Alphonsa Overall, MD FACS   ANESTHESIA:   general  DRAINS: none   BOUGIE: 36 fr ViSiGi  LOCAL MEDICATIONS USED:  MARCAINE     SPECIMEN:  Source of Specimen:  Greater curvature of stomach  DISPOSITION OF SPECIMEN:  PATHOLOGY  COUNTS:  YES  INDICATION FOR PROCEDURE: This is a very pleasant 43 year old morbidly obese AAM who has had unsuccessful attempts for sustained weight loss. he presents today for a planned laparoscopic sleeve gastrectomy with upper endoscopy. We have discussed the risk and benefits of the procedure extensively preoperatively. Please see my separate notes.  PROCEDURE: After obtaining informed consent and receiving 5000 units of subcutaneous heparin, the patient was brought to the operating room at [facility] and placed supine on the operating room table. General endotracheal anesthesia was established. Sequential compression devices were placed. A Foley catheter was placed. The patient's abdomen was prepped and draped in the usual standard surgical fashion. She received preoperative IV antibiotics. A surgical timeout was performed.  Access to the abdomen was achieved using a 5 mm 0 laparoscope thru a 5 mm trocar In the left upper Quadrant 2 fingerbreadths below the left subcostal margin using the Optiview technique. Pneumoperitoneum was smoothly established up to 15 mm of mercury. The laparoscope was advanced and the abdominal cavity was surveilled. There were no unusual findings on laparoscopy.  A 5 mm trocar was placed slightly above and to the left of the umbilicus under  direct visualization. The patient was then placed in reverse Trendelenburg. The [facility] liver retractor was placed under the left lobe of the liver through a 5 mm trocar incision site in the subxiphoid position. A 5 mm trocar was placed in the lateral right upper quadrant along with a 12 mm trocar in the mid right abdomen  All under direct visualization after local had been infiltrated.  The stomach was inspected. It was completely decompressed and Calibration tubing was placed into the stomach by the nurse anesthetist. Because there is reports of a small hiatal hernia on upper GI I decided to test him for a hiatal hernia. 15 cc of air was insufflated into the balloon of the calibration tubing. It was then pulled back toward the GE junction and met resistance. It was then desufflated and re\re advanced into the stomach. 10 cc of air was placed into the balloon of the calibration tubing. It was pulled back toward the GE junction and met resistance; therefore, I decided he did not have a clinically significant hiatal hernia. The calibration tubing was desufflated and removed from the patient's body.  A 36Fr ViSiGi was placed. the ViSiGi tube was pulled back into the esophagus. We identified the pylorus and measured 5 cm proximal to the pylorus and identified an area of where we would start taking down the short gastric vessels. Harmonic scalpel was used to take down the short gastric vessels along the greater curvature of the stomach. We were able to enter the lesser sac. We continued to march along the greater curvature of the stomach taking down the short gastrics. As we approached the  gastrosplenic ligament we took care in this area not to injure the spleen. We were able to take down the entire gastrosplenic ligament. We then mobilized the fundus away from the left crus of diaphragm. There were not any significant posterior gastric avascular attachments. This left the stomach completely mobilized. No vessels  had been taken down along the lesser curvature of the stomach. The patient had a very redundant posterior fundus and cardia  We then reidentified the pylorus. The ViSiGi was then re advanced and placed in the distal antrum and positioned along the lesser curvature. It was placed under suction which secured the 36Fr ViSiGi in place along the lesser curve. Then using the [employer] echelon 60 mm stapler with a green load, I placed a stapler along the antrum approximately 5 cm from the pylorus. The stapler was angled so that there is ample room at the angularis incisura. I then fired the first staple load after inspecting it posteriorly to ensure adequate space both anteriorly and posteriorly. At this point I still was not completely past the angularis so with another green load, I placed the stapler in position just inside the prior stapleline. We then rotated the stomach to insure that there was adequate anteriorly as well as posteriorly. The stapler was then fired. At this point I started using gold load staple cartridges. The echelon stapler was then repositioned with a 60 mm gold load and we continued to march up along the [place]. My assistant was holding traction along the greater curvature stomach along the cauterized short gastric vessels ensuring that the stomach was symmetrically retracted. Prior to each firing of the staple, we rotated the stomach to ensure that there is adequate stomach left.  The mid left upper quadrant  trocar was upsized to a 12 mm trocar to help with stapling. As we approached the fundus, the last firing of the stapler was lateral to the esophageal fat pad. Although the staples on this fire had completely gone thru the last part of the stomach it had not completely cut it. Therefore 1 additional 60 gold load was used to free the remaining stomach. The sleeve was inspected. There is no evidence of cork screw. The staple line appeared hemostatic except for 1 spot near the fundus which was  clipped twice and hemostasis was achieved. A total of (7) 39mm gold cartridges had been used. The CRNA inflated the ViSiGi to the green zone and the upper abdomen was flooded with saline. There were no bubbles. The sleeve was decompressed and the ViSiGi removed. My assistant scrubbed out and performed an upper endoscopy. The sleeve easily distended with air and the scope was easily advanced to the pylorus. There is no evidence of internal bleeding or cork screwing. There is no evidence of bubbles. Please see his operative note for further details. The gastric sleeve was decompressed and the endoscope was removed. Tisseel tissue sealant was applied along the entire length of the staple line. The greater curvature the stomach was grasped with a laparoscopic grasper and removed from the 15 mm trocar site.  The liver retractor was removed. I then closed the 49mm trocar site with 4 interrupted 0 Vicryl sutures through the fascia using the endoclose. The closure was viewed laparoscopically and it was airtight. Pneumoperitoneum was released. All trocar sites were closed with a 4-0 Monocryl in a subcuticular fashion followed by the application of Dermabond. The Foley catheter was removed. The patient was extubated and taken to the recovery room in stable condition.  All needle, instrument, and sponge counts were correct x2. There are no immediate complications  PLAN OF CARE: Admit to inpatient   PATIENT DISPOSITION:  PACU - hemodynamically stable.   Delay start of Pharmacological VTE agent (>24hrs) due to surgical blood loss or risk of bleeding:  no  Leighton Ruff. Redmond Pulling, MD, FACS General, Bariatric, & Minimally Invasive Surgery [facility], [place]

## 2013-05-31 NOTE — H&P (View-Only) (Signed)
Patient ID: Phillip Frye, male   DOB: 07/06/1970, 42 y.o.   MRN: [id number]  Chief Complaint  Patient presents with  . Bariatric Pre-op    gastric sleeve 05/31/13    HPI Phillip Frye is a 42 y.o. male.   HPI 42-year-old morbidly obese African American male comes in For his preoperative appointment. He is currently scheduled for laparoscopic sleeve gastrectomy on April 20.. I initially met him on 12/11/2012. At that time his weight was 369 pounds. I last saw him in the office on February 27 at which time his weight was 359 pounds. He was diagnosed with bilateral pulmonary emboli in August 2014 and stopped xarelto 3 weeks ago. He denies any new changes since he was last seen. He denies any abdominal pain. He is no longer taking his oral anticoagulation. He states he has lost 6 pounds through better food choices since starting his preop diet. He is using CPAP. He is not taking any meds for GERD right now. He states that he took the Prevpac for his H. Pylori positive test. He states he watched the EMMI video sleeve gastrectomy  Past Medical History  Diagnosis Date  . Sleep apnea   . Hypertension   . Arthritis   . Obesity   . Diabetes mellitus without complication   . 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 GERD (gastroesophageal reflux disease)     Past Surgical History  Procedure Laterality Date  . Hip surgery Bilateral   . Breath tek h pylori N/A 12/28/2012    Procedure: BREATH TEK H PYLORI;  [name]: Phillip Kubitz Frye Linzey Ramser, MD;  Location: [facility];  Service: General;  Laterality: N/A;    Family History  Problem Relation Age of Onset  . Cirrhosis Father   . Diabetes Other   . Diabetes Other   . Emphysema Paternal Grandfather     smoked    Social History History  Substance Use Topics  . Smoking status: Former Smoker -- 0.05 packs/day for 6 years    Types: Cigars    Quit date: 10/15/2012  . Smokeless tobacco: Never Used  . Alcohol Use: Yes     Comment:  occasional    No Known Allergies  Current Outpatient Prescriptions  Medication Sig Dispense Refill  . calcium-vitamin D (OSCAL WITH D) 500-200 MG-UNIT per tablet Take 1 tablet by mouth daily.       . fluticasone (FLONASE) 50 MCG/ACT nasal spray Place 1 spray into both nostrils daily.      . Liraglutide (VICTOZA) 18 MG/3ML SOPN Inject 1.8 Units into the skin daily.       . sitaGLIPtin (JANUVIA) 25 MG tablet Take 25 mg by mouth daily.      . [DISCONTINUED] metoprolol (LOPRESSOR) 50 MG tablet Take 50 mg by mouth 2 (two) times daily.         No current facility-administered medications for this visit.    Review of Systems Review of Systems  Constitutional: Negative for fever, chills, appetite change and unexpected weight change.  HENT: Negative for congestion and trouble swallowing.   Eyes: Negative for visual disturbance.  Respiratory: Negative for chest tightness and shortness of breath.        +OSA on cpap  Cardiovascular: Negative for chest pain and leg swelling.       No PND, no orthopnea, some DOE; h/o HTN - no longer on BP meds. Had stress test in November - see below;   Gastrointestinal:         Some occassional reflux, used to take nexium  Genitourinary: Negative for dysuria and hematuria.  Musculoskeletal: Negative.        Needs hip replacement; b/l hip avascular necrosis  Skin: Negative for rash.  Neurological: Negative for seizures and speech difficulty.  Hematological: Does not bruise/bleed easily.       On xarelto for b/l in august 2014. Cards rec continuing xarelto until BMI <30 since obesity was inciting factor for PE.   Psychiatric/Behavioral: Negative for behavioral problems and confusion.    Blood pressure 132/78, pulse 92, temperature 98 F (36.7 C), temperature source Oral, resp. rate 20, height 6' (1.829 Frye), weight 357 lb 3.2 oz (162.025 kg).  Physical Exam Physical Exam  Vitals reviewed. Constitutional: He is oriented to person, place, and time. He appears  well-developed and well-nourished. No distress.  Morbidly obese  HENT:  Head: Normocephalic and atraumatic.  Right Ear: External ear normal.  Left Ear: External ear normal.  Eyes: Conjunctivae are normal. No scleral icterus.  Neck: Normal range of motion. Neck supple. No tracheal deviation present. No thyromegaly present.  Cardiovascular: Normal rate, normal heart sounds and intact distal pulses.   Pulmonary/Chest: Effort normal and breath sounds normal. No respiratory distress. He has no wheezes.  Abdominal: Soft. He exhibits no distension. There is no tenderness. There is no rebound.  Tattoo lower abdomen  Musculoskeletal: Normal range of motion. He exhibits no edema and no tenderness.  Lymphadenopathy:    He has no cervical adenopathy.  Neurological: He is alert and oriented to person, place, and time. He exhibits normal muscle tone.  Skin: Skin is warm and dry. No rash noted. He is not diaphoretic. No erythema. No pallor.  Psychiatric: He has a normal mood and affect. His behavior is normal. Judgment and thought content normal.    Data Reviewed My office note 12/11/12, 2.27 Phillip Frye's office note 12/4 and telephone notes Phillip Frye's office note 12/21/12 CXR normal Stress test - moderate LVH, nml LV function - cleared for surgery by Phillip Frye +h pylori breath test UGI- small hiatal hernia, +reflux Evaluation labs 12/14/12- hgb a1c 6.4; cbc wnl; lipid panel wnl except for HDL 30; nml cmet  Assessment    Morbid obesity BMI 48.44 h/o B/l pulmonary emboli  H/o Anticoagulated state  OSA on CPAP  H/o HTN  IDDM  OA of hips  B/l hip avascular necrosis  GERD - no meds H/o tobacco use      Plan    It looks like everything is set for him to undergo surgery on the 20th. He was reminded that he should be on a clear liquid diet the day before surgery. We discussed the importance of compliance with the preoperative diet. I did remind him that he will be going home on Lovenox  injections because of his history of a lateral pulmonary emboli. He was reminded to bring his CPAP mask to the hospital. We discussed the importance of being active over the next week and a half. All of his questions were asked and answered.  Phillip Maiorino Frye. Donelle Baba, MD, FACS General, Bariatric, & Minimally Invasive Surgery [facility] [place]        Braniya Farrugia Frye Trinia Georgi 05/21/2013, 11:13 AM    

## 2013-05-31 NOTE — Op Note (Signed)
Name:  Phillip Frye MRN: [id number] Date of Surgery: 05/31/2013  Preop Diagnosis:  Morbid Obesity  Postop Diagnosis:  Morbid Obesity (Weight - 357, BMI - 48.4), S/P Gastric Sleeve  Procedure:  Upper endoscopy  (Intraoperative)  Foresta:  Ovidio Kinavid Jolene Guyett, M.D.  Anesthesia:  GET  Indications for procedure: Phillip Frye is a 43 y.o. male whose primary care physician is Gwynneth AlimentSANDERS,ROBYN N, MD and has completed a Gastric Sleeve today by Dr. Andrey CampanileWilson.  I am doing an intraoperative upper endoscopy to evaluate the gastric pouch.  Operative Note: The patient is under general anesthesia.  Dr. Andrey CampanileWilson is laparoscoping the patient while I do an upper endoscopy to evaluate the stomach pouch.  With the patient intubated, I passed the Pentax upper endoscope without difficulty down the esophagus.  The esophago-gastric junction was at 42 cm.    The mucosa of the stomach looked viable and the staple line was intact without bleeding.  I advanced to the pylorus, but did not go through it.  While I insufflated the stomach pouch with air, Dr. Andrey CampanileWilson  flooded the upper abdomen with saline to put the gastric pouch under saline.  There was no bubbling or evidence of a leak.  Photos were taken of the gastric pouch.  There was no evidence of narrowing of the pouch and the gastric sleeve looked tubular.  The scope was then withdrawn.  The esophagus was unremarkable and the patient tolerated the endoscopy without difficulty.  Ovidio Kinavid Kely Dohn, MD, [facility] Pager: [phone] Office phone:  [phone]

## 2013-06-01 ENCOUNTER — Encounter (HOSPITAL_COMMUNITY): Payer: Self-pay | Admitting: General Surgery

## 2013-06-01 ENCOUNTER — Observation Stay (HOSPITAL_COMMUNITY): Payer: Medicare Other

## 2013-06-01 LAB — CBC WITH DIFFERENTIAL/PLATELET
Basophils Absolute: 0 10*3/uL (ref 0.0–0.1)
Basophils Relative: 0 % (ref 0–1)
Eosinophils Absolute: 0 10*3/uL (ref 0.0–0.7)
Eosinophils Relative: 0 % (ref 0–5)
HCT: 43.1 % (ref 39.0–52.0)
Hemoglobin: 14.1 g/dL (ref 13.0–17.0)
Lymphocytes Relative: 19 % (ref 12–46)
Lymphs Abs: 1.4 10*3/uL (ref 0.7–4.0)
MCH: 30.1 pg (ref 26.0–34.0)
MCHC: 32.7 g/dL (ref 30.0–36.0)
MCV: 91.9 fL (ref 78.0–100.0)
Monocytes Absolute: 0.6 10*3/uL (ref 0.1–1.0)
Monocytes Relative: 8 % (ref 3–12)
Neutro Abs: 5.6 10*3/uL (ref 1.7–7.7)
Neutrophils Relative %: 74 % (ref 43–77)
Platelets: 263 10*3/uL (ref 150–400)
RBC: 4.69 MIL/uL (ref 4.22–5.81)
RDW: 12.4 % (ref 11.5–15.5)
WBC: 7.7 10*3/uL (ref 4.0–10.5)

## 2013-06-01 LAB — COMPREHENSIVE METABOLIC PANEL
ALT: 46 U/L (ref 0–53)
AST: 39 U/L — ABNORMAL HIGH (ref 0–37)
Albumin: 3.7 g/dL (ref 3.5–5.2)
Alkaline Phosphatase: 66 U/L (ref 39–117)
BUN: 16 mg/dL (ref 6–23)
CO2: 27 mEq/L (ref 19–32)
Calcium: 9 mg/dL (ref 8.4–10.5)
Chloride: 100 mEq/L (ref 96–112)
Creatinine, Ser: 1.43 mg/dL — ABNORMAL HIGH (ref 0.50–1.35)
GFR calc Af Amer: 68 mL/min — ABNORMAL LOW (ref >90)
GFR calc non Af Amer: 58 mL/min — ABNORMAL LOW (ref >90)
Glucose, Bld: 105 mg/dL — ABNORMAL HIGH (ref 70–99)
Potassium: 5.3 mEq/L (ref 3.7–5.3)
Sodium: 138 mEq/L (ref 137–147)
Total Bilirubin: 0.4 mg/dL (ref 0.3–1.2)
Total Protein: 7 g/dL (ref 6.0–8.3)

## 2013-06-01 LAB — GLUCOSE, CAPILLARY
Glucose-Capillary: 103 mg/dL — ABNORMAL HIGH (ref 70–99)
Glucose-Capillary: 84 mg/dL (ref 70–99)
Glucose-Capillary: 88 mg/dL (ref 70–99)
Glucose-Capillary: 92 mg/dL (ref 70–99)
Glucose-Capillary: 93 mg/dL (ref 70–99)
Glucose-Capillary: 96 mg/dL (ref 70–99)

## 2013-06-01 LAB — HEMOGLOBIN AND HEMATOCRIT, BLOOD
HCT: 42.2 % (ref 39.0–52.0)
Hemoglobin: 14 g/dL (ref 13.0–17.0)

## 2013-06-01 MED ORDER — LIP MEDEX EX OINT
TOPICAL_OINTMENT | CUTANEOUS | Status: AC
  Administered 2013-06-01: 02:00:00
  Filled 2013-06-01: qty 7

## 2013-06-01 MED ORDER — HYDROCODONE-ACETAMINOPHEN 7.5-325 MG/15ML PO SOLN
5.0000 mL | Freq: Four times a day (QID) | ORAL | Status: DC | PRN
  Administered 2013-06-01 – 2013-06-02 (×3): 5 mL via ORAL
  Administered 2013-06-02: 10 mL via ORAL
  Filled 2013-06-01 (×4): qty 15

## 2013-06-01 MED ORDER — SODIUM CHLORIDE 0.45 % IV SOLN
INTRAVENOUS | Status: DC
  Administered 2013-06-01 – 2013-06-02 (×2): via INTRAVENOUS

## 2013-06-01 MED ORDER — IOHEXOL 300 MG/ML  SOLN
50.0000 mL | Freq: Once | INTRAMUSCULAR | Status: AC | PRN
  Administered 2013-06-01: 50 mL via ORAL

## 2013-06-01 NOTE — Anesthesia Postprocedure Evaluation (Signed)
  Anesthesia Post-op Note  Patient: Phillip Frye  Procedure(s) Performed: Procedure(s) (LRB): LAPAROSCOPIC GASTRIC SLEEVE RESECTION (N/A)  Patient Location: PACU  Anesthesia Type: General  Level of Consciousness: awake and alert   Airway and Oxygen Therapy: Patient Spontanous Breathing  Post-op Pain: mild  Post-op Assessment: Post-op Vital signs reviewed, Patient's Cardiovascular Status Stable, Respiratory Function Stable, Patent Airway and No signs of Nausea or vomiting  Last Vitals:  Filed Vitals:   06/01/13 1317  BP: 144/86  Pulse: 79  Temp: 36.7 C  Resp: 18    Post-op Vital Signs: stable   Complications: No apparent anesthesia complications

## 2013-06-01 NOTE — Progress Notes (Signed)
1 Day Post-Op  Subjective: Doing well. No complaints. Pain ok. No n/v/reflux  Objective: Vital signs in last 24 hours: Temp:  [97.6 F (36.4 C)-98.3 F (36.8 C)] 98 F (36.7 C) (04/21 0529) Pulse Rate:  [62-87] 75 (04/21 0529) Resp:  [7-18] 18 (04/21 0529) BP: (129-170)/(73-124) 145/83 mmHg (04/21 0529) SpO2:  [95 %-100 %] 97 % (04/21 0529) Last BM Date: 05/30/13  Intake/Output from previous day: 04/20 0701 - 04/21 0700 In: 3833.3 [I.V.:3633.3; IV Piggyback:200] Out: 1800 [Urine:1750; Blood:50] Intake/Output this shift:    Alert, nad, sitting in chair cta b/l Reg Obese, soft, nd, incisions c/d/i No edema  Lab Results:   Recent Labs  05/31/13 1625 06/01/13 0400  WBC  --  7.7  HGB 15.0 14.1  HCT 44.2 43.1  PLT  --  263   BMET  Recent Labs  06/01/13 0400  NA 138  K 5.3  CL 100  CO2 27  GLUCOSE 105*  BUN 16  CREATININE 1.43*  CALCIUM 9.0   PT/INR No results found for this basename: LABPROT, INR,  in the last 72 hours ABG No results found for this basename: PHART, PCO2, PO2, HCO3,  in the last 72 hours  Studies/Results: Dg Ugi W/water Sol Cm  06/01/2013   CLINICAL DATA:  Post gastric sleeve.  EXAM: UPPER GI SERIES WITH KUB  TECHNIQUE: After obtaining a scout radiograph a routine upper GI series was performed using 50 cc of water-soluble contrast.  FLUOROSCOPY TIME:  1 min and 17 seconds.  COMPARISON:  12/24/2012 preoperative exam.  FINDINGS: Ingested contrast immediately traverses through gastric sleeve region without leak identified. Mild irregularity at the level of the gastric sleeve procedure.  Slight holdup of ingested contrast within the proximal stomach (proximal to the gastric sleeve) with the patient in an upright position. No reflux demonstrated.  IMPRESSION: Ingested contrast immediately traverses through gastric sleeve region without leak identified. Mild irregularity at the level of the gastric sleeve procedure.  Slight holdup of ingested contrast  within the proximal stomach (proximal to the gastric sleeve) with the patient in an upright position.   Electronically Signed   By: Bridgett LarssonSteve  Olson M.D.   On: 06/01/2013 09:58    Anti-infectives: Anti-infectives   Start     Dose/Rate Route Frequency Ordered Stop   05/31/13 1300  cefOXitin (MEFOXIN) 2 g in dextrose 5 % 50 mL IVPB  Status:  Discontinued     2 g 100 mL/hr over 30 Minutes Intravenous  Once 05/31/13 1244 05/31/13 1655   05/31/13 0820  cefOXitin (MEFOXIN) 2 g in dextrose 5 % 50 mL IVPB     2 g 100 mL/hr over 30 Minutes Intravenous On call to O.R. 05/31/13 0820 05/31/13 1306      Assessment/Plan: s/p Procedure(s): LAPAROSCOPIC GASTRIC SLEEVE RESECTION (N/A)  No fever, tachy, UGI looks ok - no evidence of leak -->will start POD 1 diet Elevated Creatinine - will monitor, repeat bmet in am, cont IVF VTE prophylaxis - cont lovenox, scds Heme - hgb stable Endo - blood sugars ok. Cont SSI F/E/N - cont mivf; take potassium out of fluids; water/ice chips  Mary SellaEric M. Andrey CampanileWilson, MD, FACS General, Bariatric, & Minimally Invasive Surgery [facility] [place]   LOS: 1 day    Atilano Inaric M Marks Scalera 06/01/2013

## 2013-06-01 NOTE — Progress Notes (Signed)
Patient alert and oriented, Post op day 1.  Provided support and encouragement.  Encouraged pulmonary toilet, ambulation and small sips of liquids after satisfactory completion of UGI.  Patient expressed concern per insurance did not cover Nascobal vitamins, [facility] as well as the required supplements so family can obtain prior to discharge.  All questions answered.  Will continue to monitor.

## 2013-06-01 NOTE — Progress Notes (Signed)
Pt refused CPAP for tonight. Pt is currently wearing a 2L nasal cannula SpO2 94%. Pt was made aware if he changes his mind about wearing CPAP throughout the night to contact RT. RN notified.

## 2013-06-01 NOTE — Progress Notes (Signed)
Patient ID: Phillip Frye, male   DOB: 09/24/1970, 43 y.o.   MRN: [id number] Doing well. No c/o. Tolerating water. No n/v. Mild pain.   BP 149/92  Pulse 88  Temp(Src) 98 F (36.7 C) (Oral)  Resp 16  Ht 6\' 2"  (1.88 m)  Wt 347 lb 12.8 oz (157.761 kg)  BMI 44.64 kg/m2  SpO2 97% Alert, nad Soft, mild TTP in upper abd.   Adv to POD 2 diet  Mary SellaEric M. Andrey CampanileWilson, MD, FACS General, Bariatric, & Minimally Invasive Surgery [facility] [place]

## 2013-06-01 NOTE — Progress Notes (Signed)
Placed pt on Auto titrate CPAP (min-5 & max-20cmH2O). No sterile water was added per pt request. Pt looks comfortable and is tolerating CPAP well at this time. RT will continue to monitor as needed.

## 2013-06-01 NOTE — Progress Notes (Signed)
Utilization review completed.  

## 2013-06-01 NOTE — Progress Notes (Signed)
Nutrition Education Note  Patient identified via consult for DROP protocol.   Wt Readings from Last 5 Encounters:  05/31/13 347 lb 12.8 oz (157.761 kg)  05/31/13 347 lb 12.8 oz (157.761 kg)  05/21/13 362 lb (164.202 kg)  05/21/13 357 lb 3.2 oz (162.025 kg)  05/10/13 363 lb 8 oz (164.883 kg)    Body mass index is 44.64 kg/(m^2). Patient meets criteria for class III extreme obesity based on current BMI.   Discussed 2 week post op diet with pt. Emphasized that liquids consumed must be non-carbonated, non-caffeinated, and sugar free. Importance of adequate hydration discussed. Diet questions answered. Pt c/o nausea after getting Tylenol, notified RN.   Diet: First 2 Weeks  You will see the nutritionist about two (2) weeks after your surgery. The nutritionist will increase the types of foods you can eat if you are handling liquids well:  If you have severe vomiting or nausea and cannot handle clear liquids lasting longer than 1 day, call your [place]  Protein Shake  Drink at least 2 ounces of shake 5-6 times per day  Each serving of protein shakes (usually 8 - 12 ounces) should have a minimum of:  15 grams of protein  And no more than 5 grams of carbohydrate  Goal for protein each day:  Men = 80 grams per day  Women = 60 grams per day  Protein powder may be added to fluids such as non-fat milk or Lactaid milk or Soy milk (limit to 35 grams added protein powder per serving)   Hydration  Slowly increase the amount of water and other clear liquids as tolerated (See Acceptable Fluids)  Slowly increase the amount of protein shake as tolerated  Sip fluids slowly and throughout the day  May use sugar substitutes in small amounts (no more than 6 - 8 packets per day; i.e. Splenda)   Fluid Goal  The first goal is to drink at least 8 ounces of protein shake/drink per day (or as directed by the nutritionist); some examples of protein shakes are [employer]x Nectar, Dillard'sdkins Advantage, EAS Edge HP, and  Unjury. See handout from pre-op Bariatric Education Class:  Slowly increase the amount of protein shake you drink as tolerated  You may find it easier to slowly sip shakes throughout the day  It is important to get your proteins in first  Your fluid goal is to drink 64 - 100 ounces of fluid daily  It may take a few weeks to build up to this  32 oz (or more) should be clear liquids  And  32 oz (or more) should be full liquids (see below for examples)  Liquids should not contain sugar, caffeine, or carbonation   Clear Liquids:  Water or Sugar-free flavored water (i.e. Fruit H2O, Propel)  Decaffeinated coffee or tea (sugar-free)  Crystal Lite, Wyler's Lite, Minute Maid Lite  Sugar-free Jell-O  Bouillon or broth  Sugar-free Popsicle: *Less than 20 calories each; Limit 1 per day   Full Liquids:  Protein Shakes/Drinks + 2 choices per day of other full liquids  Full liquids must be:  No More Than 12 grams of Carbs per serving  No More Than 3 grams of Fat per serving  Strained low-fat cream soup  Non-Fat milk  Fat-free Lactaid Milk  Sugar-free yogurt (Dannon Lite & Fit, Greek yogurt)   WesleyHeather Baron MS, RD, LDN [phone] Pager [phone] After Hours Pager

## 2013-06-01 NOTE — Progress Notes (Signed)
Pt complaining CPAP is "drying him out." Sterile water added in humidifier for pt comfort. RT will continue to monitor as needed.

## 2013-06-02 ENCOUNTER — Telehealth (INDEPENDENT_AMBULATORY_CARE_PROVIDER_SITE_OTHER): Payer: Self-pay

## 2013-06-02 LAB — BASIC METABOLIC PANEL
BUN: 14 mg/dL (ref 6–23)
CO2: 30 mEq/L (ref 19–32)
Calcium: 9.3 mg/dL (ref 8.4–10.5)
Chloride: 100 mEq/L (ref 96–112)
Creatinine, Ser: 1.34 mg/dL (ref 0.50–1.35)
GFR calc Af Amer: 73 mL/min — ABNORMAL LOW (ref >90)
GFR calc non Af Amer: 63 mL/min — ABNORMAL LOW (ref >90)
Glucose, Bld: 95 mg/dL (ref 70–99)
Potassium: 4.7 mEq/L (ref 3.7–5.3)
Sodium: 138 mEq/L (ref 137–147)

## 2013-06-02 LAB — CBC WITH DIFFERENTIAL/PLATELET
Basophils Absolute: 0 10*3/uL (ref 0.0–0.1)
Basophils Relative: 0 % (ref 0–1)
Eosinophils Absolute: 0 10*3/uL (ref 0.0–0.7)
Eosinophils Relative: 1 % (ref 0–5)
HCT: 43.7 % (ref 39.0–52.0)
Hemoglobin: 14.5 g/dL (ref 13.0–17.0)
Lymphocytes Relative: 28 % (ref 12–46)
Lymphs Abs: 1.8 10*3/uL (ref 0.7–4.0)
MCH: 31 pg (ref 26.0–34.0)
MCHC: 33.2 g/dL (ref 30.0–36.0)
MCV: 93.4 fL (ref 78.0–100.0)
Monocytes Absolute: 0.6 10*3/uL (ref 0.1–1.0)
Monocytes Relative: 9 % (ref 3–12)
Neutro Abs: 4 10*3/uL (ref 1.7–7.7)
Neutrophils Relative %: 62 % (ref 43–77)
Platelets: 232 10*3/uL (ref 150–400)
RBC: 4.68 MIL/uL (ref 4.22–5.81)
RDW: 12.4 % (ref 11.5–15.5)
WBC: 6.3 10*3/uL (ref 4.0–10.5)

## 2013-06-02 LAB — GLUCOSE, CAPILLARY
Glucose-Capillary: 90 mg/dL (ref 70–99)
Glucose-Capillary: 75 mg/dL (ref 70–99)

## 2013-06-02 MED ORDER — PANTOPRAZOLE SODIUM 40 MG PO TBEC: 40.0000 mg | DELAYED_RELEASE_TABLET | Freq: Every day | ORAL | Status: DC

## 2013-06-02 MED ORDER — HYDROCODONE-ACETAMINOPHEN 7.5-325 MG/15ML PO SOLN: 5.0000 mL | Freq: Four times a day (QID) | ORAL | Status: DC | PRN

## 2013-06-02 MED ORDER — ENOXAPARIN SODIUM 40 MG/0.4ML ~~LOC~~ SOLN
40.0000 mg | Freq: Two times a day (BID) | SUBCUTANEOUS | Status: DC
Start: 2013-06-02 — End: 2015-11-09

## 2013-06-02 MED ORDER — ENOXAPARIN (LOVENOX) PATIENT EDUCATION KIT
PACK | Freq: Once | Status: AC
  Administered 2013-06-02: 08:00:00
  Filled 2013-06-02: qty 1

## 2013-06-02 NOTE — Telephone Encounter (Signed)
Pt mother called in stating pt cannot afford his medications. The pharmacy told them it would be about $140 for all 3 medications he was discharged with. They will not have the money until the first of May. She is asking which one is the most important until she can get the others filled. I explained he needs to get the lovenox filled since he has a history of pulmonary emboli. I let her know pt could try over the counter omeprazole that is cheaper than the protonix until they can afford the protonx. The 3rd medicine is pain medicine. If pt not having that much pain he can hold off on getting it filled. I advised pt to call around to different pharmacies and see if medications are cheaper anywhere. I stressed to mother that pt has to get the lovenox filled. Pt mother understands.

## 2013-06-02 NOTE — Progress Notes (Signed)
2 Days Post-Op  Subjective: Doing well. Min nausea. Had nausea with oxycodone so was switched to hyacet. No issues with that. Tolerated protein shake. Sore at 1 incision o/w min pain.   Objective: Vital signs in last 24 hours: Temp:  [98 F (36.7 C)-98.8 F (37.1 C)] 98.8 F (37.1 C) (04/22 0552) Pulse Rate:  [74-92] 92 (04/22 0552) Resp:  [16-20] 20 (04/22 0552) BP: (144-156)/(73-103) 156/103 mmHg (04/22 0552) SpO2:  [97 %-100 %] 97 % (04/22 0552) Last BM Date: 05/30/13  Intake/Output from previous day: 04/21 0701 - 04/22 0700 In: 2958.3 [I.V.:2658.3; IV Piggyback:300] Out: 2725 [Urine:2725] Intake/Output this shift:    Asleep, easily awakens Nontoxic cta b/l Reg Obese, soft, only ttp at extraction site, incision c/d/i No edema  Lab Results:   Recent Labs  06/01/13 0400 06/01/13 1559 06/02/13 0403  WBC 7.7  --  6.3  HGB 14.1 14.0 14.5  HCT 43.1 42.2 43.7  PLT 263  --  232   BMET  Recent Labs  06/01/13 0400 06/02/13 0403  NA 138 138  K 5.3 4.7  CL 100 100  CO2 27 30  GLUCOSE 105* 95  BUN 16 14  CREATININE 1.43* 1.34  CALCIUM 9.0 9.3   PT/INR No results found for this basename: LABPROT, INR,  in the last 72 hours ABG No results found for this basename: PHART, PCO2, PO2, HCO3,  in the last 72 hours  Studies/Results: Dg Ugi W/water Sol Cm  06/01/2013   CLINICAL DATA:  Post gastric sleeve.  EXAM: UPPER GI SERIES WITH KUB  TECHNIQUE: After obtaining a scout radiograph a routine upper GI series was performed using 50 cc of water-soluble contrast.  FLUOROSCOPY TIME:  1 min and 17 seconds.  COMPARISON:  12/24/2012 preoperative exam.  FINDINGS: Ingested contrast immediately traverses through gastric sleeve region without leak identified. Mild irregularity at the level of the gastric sleeve procedure.  Slight holdup of ingested contrast within the proximal stomach (proximal to the gastric sleeve) with the patient in an upright position. No reflux demonstrated.   IMPRESSION: Ingested contrast immediately traverses through gastric sleeve region without leak identified. Mild irregularity at the level of the gastric sleeve procedure.  Slight holdup of ingested contrast within the proximal stomach (proximal to the gastric sleeve) with the patient in an upright position.   Electronically Signed   By: Phillip LarssonSteve  Frye M.D.   On: 06/01/2013 09:58    Anti-infectives: Anti-infectives   Start     Dose/Rate Route Frequency Ordered Stop   05/31/13 1300  cefOXitin (MEFOXIN) 2 g in dextrose 5 % 50 mL IVPB  Status:  Discontinued     2 g 100 mL/hr over 30 Minutes Intravenous  Once 05/31/13 1244 05/31/13 1655   05/31/13 0820  cefOXitin (MEFOXIN) 2 g in dextrose 5 % 50 mL IVPB     2 g 100 mL/hr over 30 Minutes Intravenous On call to O.R. 05/31/13 0820 05/31/13 1306      Assessment/Plan: s/p Procedure(s): LAPAROSCOPIC GASTRIC SLEEVE RESECTION (N/A) Active Problems:   HYPERTENSION   GERD   SLEEP APNEA   Diabetes mellitus without complication   S/P laparoscopic sleeve gastrectomy   Morbid obesity  Doing well.  No fever, tachycardia. Cont POD 2 diet Blood sugars ok.  Cbc stable.  D/c later this am after teaching.   B/c of h/o PE will go home on lovenox injections. lovenox teaching. Has used before.  Since issue with oxycodone - will give Rx for new pain med  Phillip SellaEric M. Andrey CampanileWilson, MD, FACS General, Bariatric, & Minimally Invasive Surgery [facility] [place]   LOS: 2 days    [name] 06/02/2013

## 2013-06-02 NOTE — Telephone Encounter (Signed)
[facility] [address] per pt's request.

## 2013-06-02 NOTE — Discharge Summary (Signed)
Physician Discharge Summary  Phillip Frye WUJ:[id number] DOB: 04-26-1970 DOA: 05/31/2013  PCP: Gwynneth AlimentSANDERS,ROBYN N, MD  Admit date: 05/31/2013 Discharge date: 06/02/2013  Recommendations for Outpatient Follow-up:  1.   Follow-up Information   Follow up with Atilano InaWILSON,Paras Kreider M, MD On 06/16/2013. (9:30 AM for recheck)    Specialty:  General Surgery   Contact information:   [address] [place] [address] [phone]       Follow up with Gwynneth AlimentSANDERS,ROBYN N, MD. Schedule an appointment as soon as possible for a visit in 2 weeks.   Specialty:  Internal Medicine   Contact information:   [address] [place] [address] [phone]      Discharge Diagnoses:  Active Problems:   HYPERTENSION   GERD   SLEEP APNEA   Diabetes mellitus without complication   S/P laparoscopic sleeve gastrectomy   Morbid obesity   Surgical Procedure: Laparoscopic sleeve gastrectomy, upper endoscopy  Discharge Condition: Good Disposition: Home  Diet recommendation: Postoperative sleeve gastrectomy  Filed Weights   05/31/13 0817  Weight: 347 lb 12.8 oz (157.761 kg)     Hospital Course:  The patient was admitted for a planned laparoscopic sleeve gastrectomy. Please see operative note. Preoperatively the patient was given 5000 units of subcutaneous heparin for DVT prophylaxis. Postoperative prophylactic Lovenox dosing was started on the morning of postoperative day 1. The patient underwent an upper GI on postoperative day 1 which demonstrated no extravasation of contrast and emptying of the contrast. The patient was started on ice chips and water which they tolerated. On the evening of postoperative day 1 The patient's diet was advanced to protein shakes which they also tolerated. The patient was ambulating without difficulty. Their vital signs are stable without fever or tachycardia. Their hemoglobin had remained stable. The patient was maintained on their home settings for CPAP  therapy. The patient had received discharge instructions and counseling. They were deemed stable for discharge.  Because of his history of bilateral pulmonary emboli about 8 months ago I am sending him home on 2 weeks of prophylactic Lovenox dosing. He was instructed not to resume his Xarelto.   Discharge Instructions      Discharge Orders   Future Appointments Provider Department Dept Phone   06/15/2013 3:30 PM [facility] [phone]   06/16/2013 9:30 AM Atilano InaEric M Winry Egnew, MD [facility], [place] [phone]   Future Orders Complete By Expires   Discharge instructions  As directed    Increase activity slowly  As directed        Medication List    STOP taking these medications       sitaGLIPtin 25 MG tablet  Commonly known as:  JANUVIA     VICTOZA 18 MG/3ML Sopn  Generic drug:  Liraglutide      TAKE these medications       calcium-vitamin D 500-200 MG-UNIT per tablet  Commonly known as:  OSCAL WITH D  Take 1 tablet by mouth daily.     enoxaparin 40 MG/0.4ML injection  Commonly known as:  LOVENOX  Inject 0.4 mLs (40 mg total) into the skin every 12 (twelve) hours.     fluticasone 50 MCG/ACT nasal spray  Commonly known as:  FLONASE  Place 1 spray into both nostrils daily.     HYDROcodone-acetaminophen 7.5-325 mg/15 ml solution  Commonly known as:  HYCET  Take 5-10 mLs by mouth every 6 (six) hours as needed for moderate  pain.     pantoprazole 40 MG tablet  Commonly known as:  PROTONIX  Take 1 tablet (40 mg total) by mouth daily.       Follow-up Information   Follow up with Atilano InaWILSON,Dariane Natzke M, MD On 06/16/2013. (9:30 AM for recheck)    Specialty:  General Surgery   Contact information:   [address] [place] [address] [phone]       Follow up with Gwynneth AlimentSANDERS,ROBYN N, MD. Schedule an appointment as soon as possible for a visit in 2 weeks.   Specialty:  Internal Medicine   Contact  information:   [address] [place] [address] [phone]        The results of significant diagnostics from this hospitalization (including imaging, microbiology, ancillary and laboratory) are listed below for reference.    Significant Diagnostic Studies: Dg Ugi W/water Sol Cm  06/01/2013   CLINICAL DATA:  Post gastric sleeve.  EXAM: UPPER GI SERIES WITH KUB  TECHNIQUE: After obtaining a scout radiograph a routine upper GI series was performed using 50 cc of water-soluble contrast.  FLUOROSCOPY TIME:  1 min and 17 seconds.  COMPARISON:  12/24/2012 preoperative exam.  FINDINGS: Ingested contrast immediately traverses through gastric sleeve region without leak identified. Mild irregularity at the level of the gastric sleeve procedure.  Slight holdup of ingested contrast within the proximal stomach (proximal to the gastric sleeve) with the patient in an upright position. No reflux demonstrated.  IMPRESSION: Ingested contrast immediately traverses through gastric sleeve region without leak identified. Mild irregularity at the level of the gastric sleeve procedure.  Slight holdup of ingested contrast within the proximal stomach (proximal to the gastric sleeve) with the patient in an upright position.   Electronically Signed   By: Bridgett LarssonSteve  Olson M.D.   On: 06/01/2013 09:58    Labs: Basic Metabolic Panel:  Recent Labs Lab 06/01/13 0400 06/02/13 0403  NA 138 138  K 5.3 4.7  CL 100 100  CO2 27 30  GLUCOSE 105* 95  BUN 16 14  CREATININE 1.43* 1.34  CALCIUM 9.0 9.3   Liver Function Tests:  Recent Labs Lab 06/01/13 0400  AST 39*  ALT 46  ALKPHOS 66  BILITOT 0.4  PROT 7.0  ALBUMIN 3.7    CBC:  Recent Labs Lab 05/31/13 1625 06/01/13 0400 06/01/13 1559 06/02/13 0403  WBC  --  7.7  --  6.3  NEUTROABS  --  5.6  --  4.0  HGB 15.0 14.1 14.0 14.5  HCT 44.2 43.1 42.2 43.7  MCV  --  91.9  --  93.4  PLT  --  263  --  232    CBG:  Recent Labs Lab  06/01/13 1544 06/01/13 1940 06/01/13 2339 06/02/13 0325 06/02/13 0752  GLUCAP 96 84 93 90 75    Active Problems:   HYPERTENSION   GERD   SLEEP APNEA   Diabetes mellitus without complication   S/P laparoscopic sleeve gastrectomy   Morbid obesity   Time coordinating discharge: 15 minutes  Signed:  Atilano InaEric M Nyleah Mcginnis, MD [facility], [place] [phone] 06/02/2013, 10:58 AM

## 2013-06-02 NOTE — Discharge Instructions (Signed)
DO NOT TAKE XARELTO    GASTRIC BYPASS/SLEEVE  Home Care Instructions   These instructions are to help you care for yourself when you go home.  Call: If you have any problems.   Call [phone] and ask for the [place] on call   If you need immediate assistance come to the ER at [facility]. Tell the ER staff you are a new post-op gastric bypass or gastric sleeve patient  Signs and symptoms to report:   Severe  vomiting or nausea o If you cannot handle clear liquids for longer than 1 day, call your [place]   Abdominal pain which does not get better after taking your pain medication   Fever greater than 100.4  F and chills   Heart rate over 100 beats a minute   Trouble breathing   Chest pain   Redness,  swelling, drainage, or foul odor at incision (surgical) sites   If your incisions open or pull apart   Swelling or pain in calf (lower leg)   Diarrhea (Loose bowel movements that happen often), frequent watery, uncontrolled bowel movements   Constipation, (no bowel movements for 3 days) if this happens: o Take Milk of Magnesia, 2 tablespoons by mouth, 3 times a day for 2 days if needed o Stop taking Milk of Magnesia once you have had a bowel movement o Call your doctor if constipation continues Or o Take Miralax  (instead of Milk of Magnesia) following the label instructions o Stop taking Miralax once you have had a bowel movement o Call your doctor if constipation continues   Anything you think is abnormal for you   Normal side effects after surgery:   Unable to sleep at night or unable to concentrate   Irritability   Being tearful (crying) or depressed  These are common complaints, possibly related to your anesthesia, stress of surgery, and change in lifestyle, that usually go away a few weeks after surgery. If these feelings continue, call your medical doctor.  Wound Care: You may have surgical glue, steri-strips, or staples over your incisions after surgery   Surgical  glue: Looks like clear film over your incisions and will wear off a little at a time   Steri-strips: Adhesive strips of tape over your incisions. You may notice a yellowish color on skin under the steri-strips. This is used to make the steri-strips stick better. Do not pull the steri-strips off - let them fall off   Staples: Staples may be removed before you leave the hospital o If you go home with staples, call [facility] for an appointment with your surgeons nurse to have staples removed 10 days after surgery, [phone]   Showering: You may shower two (2) days after your surgery unless your [place] tells you differently o Wash gently around incisions with warm soapy water, rinse well, and gently pat dry o If you have a drain (tube from your incision), you may need someone to hold this while you shower o No tub baths until staples are removed and incisions are healed   Medications:   Medications should be liquid or crushed if larger than the size of a dime   Extended release pills (medication that releases a little bit at a time through the  day) should not be crushed   Depending on the size and number of medications you take, you may need to space (take a few throughout the day)/change the time you take your medications so that you do not  life -style changes °• If you have diabetes, follow up with your doctor that orders your diabetes medication(s) within one week after surgery and check your blood sugar regularly ° °• Do not drive while taking narcotics (pain medications) ° °• Do not take acetaminophen (Tylenol) and Roxicet or Lortab Elixir at the same time since these pain medications contain acetaminophen °  °Diet:  °First 2 Weeks You will see the nutritionist about two (2) weeks after your surgery. The nutritionist will  increase the types of foods you can eat if you are handling liquids well: °• If you have severe vomiting or nausea and cannot handle clear liquids lasting longer than 1 day call your [place] °Protein Shake °• Drink at least 2 ounces of shake 5-6 times per day °• Each serving of protein shakes (usually 8-12 ounces) should have a minimum of: °o 15 grams of protein °o And no more than 5 grams of carbohydrate °• Goal for protein each day: °o Men = 80 grams per day °o Women = 60 grams per day °  ° • Protein powder may be added to fluids such as non-fat milk or Lactaid milk or Soy milk (limit to 35 grams added protein powder per serving) ° °Hydration °• Slowly increase the amount of water and other clear liquids as tolerated (See Acceptable Fluids) °• Slowly increase the amount of protein shake as tolerated °• Sip fluids slowly and throughout the day °• May use sugar substitutes in small amounts (no more than 6-8 packets per day; i.e. Splenda) ° °Fluid Goal °• The first goal is to drink at least 8 ounces of protein shake/drink per day (or as directed by the nutritionist); some examples of protein shakes are Syntrax Nectar, Adkins Advantage, EAS Edge HP, and Unjury. - See handout from pre-op Bariatric Education Class: °o Slowly increase the amount of protein shake you drink as tolerated °o You may find it easier to slowly sip shakes throughout the day °o It is important to get your proteins in first °• Your fluid goal is to drink 64-100 ounces of fluid daily °o It may take a few weeks to build up to this  °• 32 oz. (or more) should be clear liquids °And °• 32 oz. (or more) should be full liquids (see below for examples) °• Liquids should not contain sugar, caffeine, or carbonation ° °Clear Liquids: °• Water of Sugar-free flavored water (i.e. Fruit H²O, Propel) °• Decaffeinated coffee or tea (sugar-free) °• Crystal lite, Wyler’s Lite, Minute Maid Lite °• Sugar-free Jell-O °• Bouillon or broth °• Sugar-free Popsicle:    -  Less than 20 calories each; Limit 1 per day ° °Full Liquids: °                  Protein Shakes/Drinks + 2 choices per day of other full liquids °• Full liquids must be: °o No More Than 12 grams of Carbs per serving °o No More Than 3 grams of Fat per serving °• Strained low-fat cream soup °• Non-Fat milk °• Fat-free Lactaid Milk °• Sugar-free yogurt (Dannon Lite & Fit, Greek yogurt) ° °  °Vitamins and Minerals • Start 1 day after surgery unless otherwise directed by your [place] °• 2 Chewable Multivitamin / Multimineral Supplement with iron (i.e. Centrum for Adults) °• Vitamin B-12, 350-500 micrograms sub-lingual (place tablet under the tongue) each day °• Chewable Calcium Citrate with Vitamin D-3 °(Example: 3 Chewable Calcium  Plus 600 with Vitamin D-3) °o Take 500 mg three (3)   times a day for a total of 1500 mg each day °o Do not take all 3 doses of calcium at one time as it may cause constipation, and you can only absorb 500 mg at a time °o Do not mix multivitamins containing iron with calcium supplements;  take 2 hours apart °o Do not substitute Tums (calcium carbonate) for your calcium °• Menstruating women and those at risk for anemia ( a blood disease that causes weakness) may need extra iron °o Talk to your doctor to see if you need more iron °• If you need extra iron: Total daily Iron recommendation (including Vitamins) is 50 to 100 mg Iron/day °• Do not stop taking or change any vitamins or minerals until you talk to your nutritionist or [place] °• Your nutritionist and/or Sz[place]ak must approve all vitamin and mineral supplements °  °Activity and Exercise: It is important to continue walking at home. Limit your physical activity as instructed by your doctor. During this time, use these guidelines: °• Do not lift anything greater than ten  (10) pounds for at least two (2) weeks °• Do not go back to work or drive until your [place] says you can °• You may have sex when you feel comfortable °o It is VERY  important for male patients to use a reliable birth control method; fertility often increase after surgery °o Do not get pregnant for at least 18 months °• Start exercising as soon as your doctor tells you that you can °o Make sure your doctor approves any physical activity °• Start with a simple walking program °• Walk 5-15 minutes each day, 7 days per week °• Slowly increase until you are walking 30-45 minutes per day °• Consider joining our BELT program. [phone] or email [email or web address] °  °Special Instructions Things to remember: °• Free counseling is available for you and your family through collaboration between [place] [facility] and [employer]. Please call [phone] and leave a message °• Use your CPAP when sleeping if this applies to you °• Consider buying a medical alert bracelet that says you had lap-band surgery °  °  You will likely have your first fill (fluid added to your band) 6 - 8 weeks after surgery °• [facility] has a free Bariatric Surgery Support Group that meets monthly, the 3rd Thursday, 6pm. [facility] Classrooms. You can see classes online at [email or web address] °• It is very important to keep all follow up appointments with your Mia, nutritionist, primary care physician, and behavioral health practitioner °o After the first year, please follow up with your bariatric Mc[place] and nutritionist at least once a year in order to maintain best weight loss results °      °             [facility]:  [phone] ° °             [facility]: [phone] ° °             Bariatric Nurse Coordinator: [phone]  °Gastric Bypass/Sleeve Home Care Instructions  Rev. 03/2012    ° °                                                    Reviewed and Endorsed °                                                     [phone]  Gastric Bypass/Sleeve Home Care Instructions  Rev. 03/2012                                                         Reviewed and Endorsed                                                    by [facility] Patient Education Committee, Jan, 2014 Enoxaparin, Home Use Enoxaparin (Lovenox) injection is  a medication used to prevent clots from developing in your veins. Medications such as enoxaparin are called blood thinners or anticoagulants. If blood clots are untreated they could travel to your lungs. This is called a pulmonary embolus. A blood clot in your lungs can be fatal. Caregivers often use anticoagulants such as enoxaparin to prevent clots following surgery. It is also used along with aspirin when the heart is not getting enough blood. Continue the enoxaparin injections as directed by your caregiver. Your caregiver will use blood clotting test results to decide when you can safely stop using enoxaparin injections. If your caregiver prescribes any additional anticoagulant, you must take it exactly as directed. RISKS AND COMPLICATIONS  If you have received recent epidural anesthesia, spinal anesthesia, or a spinal tap while receiving anticoagulants, you are at risk for developing a blood clot in or around the spine. This condition could result in long-term or permanent paralysis.  Because anticoagulants thin your blood, severe bleeding may occur from any tissue or organ. Symptoms of the blood being too thin may include:  Bleeding from the nose or gums that does not stop quickly.  Unusual bruising or bruising easily.  Swelling or pain at an injection site.  A cut that does not stop bleeding within 10 minutes.  Continual nausea for more than 1 day or vomiting blood.  Coughing up blood.  Blood in the urine which may appear as pink, red, or brown urine.  Blood in bowel movements which may appear as red, dark or black stools.  Sudden weakness or numbness of the face, arm, or leg, especially on one side of the body.  Sudden confusion.  Trouble speaking (aphasia) or understanding.  Sudden trouble seeing in one or both eyes.  Sudden trouble walking.  Dizziness.  Loss of balance or coordination.  Severe pain, such as a headache, joint pain, or back pain.  Fever.  Bruising  around the injection sites may be expected.  Platelet drops, known as "thrombocytopenia," can occur with enoxaparin use. A condition called "heparin-induced thrombocytopenia" has been seen. If you have had this condition, you should tell your caregiver. Your caregiver may direct you to have blood tests to monitor this condition.  Do not use if you have allergies to the medication, heparin, or pork products.  Other side effects may include mild local reactions or irritation at the site of injection, pain, bruising, and redness of skin. HOME CARE INSTRUCTIONS You will be instructed by your caregiver how to give enoxaparin injections. 1. Before giving your medication you should make sure the injection is a clear and colorless or pale yellow solution. If your medication becomes discolored or has particles in the bottle, do not use and  notify your caregiver. 2. When using the 30 and 40 mg pre-filled syringes, do not expel the air bubble from the syringe before the injection. This makes sure you use all the medication in the syringe. 3. The injections will be given subcutaneously. This means it is given into the fat over the belly (abdomen). It is given deep beneath the skin but not into the muscle. The shots should be injected around the abdominal wall. Change the sites of injection each time. The whole length of the needle should be introduced into a skin fold held between the thumb and forefinger; the skin fold should be held throughout the injection. Do not rub the injection site after completion of the injection. This increases bruising. Enoxaparin injection pre-filled syringes and graduated pre-filled syringes are available with a system that shields the needle after injection. 4. Inject by pushing the plunger to the bottom of the syringe. 5. Remove the syringe from the injection site keeping your finger on the plunger rod. Be careful not to stick yourself or others. 6. After injection and the syringe  is empty, set off the safety system by firmly pushing the plunger rod. The protective sleeve will automatically cover the needle and you can hear a click. The click means your needle is safely covered. Do not try replacing the needle shield. 7. Get rid of the syringe in the nearest sharps container. 8. Keep your medication safely stored at room temperatures.  Due to the complications of anticoagulants, it is very important that you take your anticoagulant as directed by your caregiver. Anticoagulants need to be taken exactly as instructed. Be sure you understand all your anticoagulant instructions.  Changes in medicines, supplements, diet, and illness can affect your anticoagulation therapy. Be sure to inform your caregivers of any of these changes.  While on anticoagulants, you will need to have blood tests done routinely as directed by your caregivers.  Be careful not to cut yourself when using sharp objects.  Limit physical activities or sports that could result in a fall or cause injury.  It is extremely important that you tell all of your caregivers and dentist that you are taking an anticoagulant, especially if you are injured or plan to have any type of procedure or operation.  Follow up with your laboratory test and caregiver appointments as directed. It is very important to keep your appointments. Not keeping appointments could result in a chronic or permanent injury, pain, or disability. SEEK MEDICAL CARE IF:  You develop any rashes.  You have any worsening of the condition for which you are receiving anticoagulation therapy. SEEK IMMEDIATE MEDICAL CARE IF:  Bleeding from the nose or gums does not stop quickly.  You have unusual bruising or are bruising easily.  Swelling or pain occurs at an injection site.  A cut does not stop bleeding within 10 minutes.  You have continual nausea for more than 1 day or are vomiting blood.  You are coughing up blood.  You have blood in  the urine.  You have dark or black stools.  You have sudden weakness or numbness of the face, arm, or leg, especially on one side of the body.  You have sudden confusion.  You have trouble speaking (aphasia) or understanding.  You have sudden trouble seeing in one or both eyes.  You have sudden trouble walking.  You have dizziness.  You have a loss of balance or coordination.  You have severe pain, such as a headache, joint pain, or  back pain.  You have a serious fall or head injury, even if you are not bleeding.  You have an oral temperature above 102 F (38.9 C), not controlled by medicine. ANY OF THESE SYMPTOMS MAY REPRESENT A SERIOUS PROBLEM THAT IS AN EMERGENCY. Do not wait to see if the symptoms will go away. Get medical help right away. Call your local emergency services (911 in [place] DO NOT drive yourself to the hospital. MAKE SURE YOU:  Understand these instructions.  Will watch your condition.  Will get help right away if you are not doing well or get worse. Document Released: 11/30/2003 Document Revised: 04/22/2011 Document Reviewed: 01/28/2005 [facility] Patient Information 2014 [place] [place].

## 2013-06-02 NOTE — Progress Notes (Signed)
Patient alert and oriented, pain is controlled. Patient is tolerating fluids, plan to advanced to protein shake last night, patient tolerated well.  Reviewed Gastric sleeve discharge instructions with patient and patient is able to articulate understanding.  Provided information on BELT program, Support Group and [facility] outpatient pharmacy. All questions answered, will continue to monitor.

## 2013-06-02 NOTE — Progress Notes (Signed)
Patient given Lovenox teaching. Verbalized understanding and returned demonstrated. Lovenox teaching kit given.

## 2013-06-03 ENCOUNTER — Telehealth (HOSPITAL_COMMUNITY): Payer: Self-pay

## 2013-06-08 ENCOUNTER — Encounter (INDEPENDENT_AMBULATORY_CARE_PROVIDER_SITE_OTHER): Payer: Self-pay | Admitting: General Surgery

## 2013-06-15 ENCOUNTER — Encounter: Payer: Medicare Other | Attending: General Surgery

## 2013-06-15 DIAGNOSIS — Z713 Dietary counseling and surveillance: Secondary | ICD-10-CM | POA: Insufficient documentation

## 2013-06-15 DIAGNOSIS — Z6841 Body Mass Index (BMI) 40.0 and over, adult: Secondary | ICD-10-CM | POA: Insufficient documentation

## 2013-06-15 NOTE — Patient Instructions (Signed)
Patient to follow Phase 3A-Soft, High Protein Diet and follow-up at [facility] in 6 weeks for 2 months post-op nutrition visit for diet advancement. 

## 2013-06-15 NOTE — Progress Notes (Signed)
Bariatric Class:  Appt start time: 1530 end time:  1630.  2 Week Post-Operative Nutrition Class  Patient was seen on 06/15/2013 for Post-Operative Nutrition education at [facility].   Surgery date: 05/31/2013 Surgery type: Gastric Sleeve Start weight at [facility]: 360 lbs on 03/04/2013 Weight today: 340.0 lbs Weight change: 23.5 lbs  TANITA  BODY COMP RESULTS  05/10/13 06/15/13   BMI (kg/m^2) 46.7 43.7   Fat Mass (lbs) 204.5 131.5   Fat Free Mass (lbs) 159 209.0   Total Body Water (lbs) 116.5 153.0   The following the learning objectives were met by the patient during this course:  Identifies Phase 3A (Soft, High Proteins) Dietary Goals and will begin from 2 weeks post-operatively to 2 months post-operatively  Identifies appropriate sources of fluids and proteins   States protein recommendations and appropriate sources post-operatively  Identifies the need for appropriate texture modifications, mastication, and bite sizes when consuming solids  Identifies appropriate multivitamin and calcium sources post-operatively  Describes the need for physical activity post-operatively and will follow MD recommendations  States when to call healthcare provider regarding medication questions or post-operative complications  Handouts given during class include:  Phase 3A: Soft, High Protein Diet Handout  Follow-Up Plan: Patient will follow-up at [facility] in 6 weeks for 2 month post-op nutrition visit for diet advancement per MD.

## 2013-06-16 ENCOUNTER — Encounter (INDEPENDENT_AMBULATORY_CARE_PROVIDER_SITE_OTHER): Payer: Self-pay | Admitting: General Surgery

## 2013-06-16 ENCOUNTER — Ambulatory Visit (INDEPENDENT_AMBULATORY_CARE_PROVIDER_SITE_OTHER): Payer: Medicare Other | Admitting: General Surgery

## 2013-06-16 VITALS — BP 118/82 | HR 78 | Temp 97.0°F | Resp 14 | Ht 74.0 in | Wt 335.0 lb

## 2013-06-16 DIAGNOSIS — Z9884 Bariatric surgery status: Secondary | ICD-10-CM

## 2013-06-16 NOTE — Patient Instructions (Addendum)
Eating techniques 20-20-20 (30-30-30) 20 chews, 20 seconds between bites of food, 20 minutes to eat; sometimes you may need 30 chews, 30 seconds etc Use your nondominant hand to eat with Put fork down between bites of food Use a timer after swallowing to reinforce waiting 20-30 sec between bites of food Use a child/infant size utensil Try not to eat while watching TV  Walk daily Drink plenty of liquids Stop taking oral diabetes medication Make appt with your PCP Continue to take protonix (reflux) medication

## 2013-06-16 NOTE — Progress Notes (Signed)
Subjective:     Patient ID: Phillip Frye, male   DOB: 09/01/1970, 43 y.o.   MRN: [id number]  HPI [age] year old [place] male comes in for followup after undergoing laparoscopic sleeve gastrectomy on April 20. [facility] He states he is doing well. He denies any fevers or chills. He denies any nausea or vomiting. He denies any regurgitation. His diet was advanced to solid proteins yesterday. He denies any lightheadedness or dizziness. He denies any shortness of breath or calf pain. He did take the Lovenox for 2 weeks. He is still using his CPAP. He states his blood sugars have ranged from 88 to 120s. He denies any melena or hematochezia. He denies any abdominal pain. He states he is taking his supplements. He is walking 45 minutes several times a week.  Review of Systems     Objective:   Physical Exam BP 118/82  Pulse 78  Temp(Src) 97 F (36.1 C) (Temporal)  Resp 14  Ht 6\' 2"  (1.88 m)  Wt 335 lb (151.955 kg)  BMI 42.99 kg/m2  Gen: alert, NAD, non-toxic appearing Pupils: equal, no scleral icterus Pulm: Lungs clear to auscultation, symmetric chest rise CV: regular rate and rhythm Abd: soft, nontender, nondistended. Well-healed trocar sites. No cellulitis. 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 His initial visit weight was 369 pounds. His preoperative weight was 358 pounds. I congratulated him on his weight loss. We discussed the importance of routine exercise. I have cleared him to return to the gym. However I did recommend that he stay hydrated as much is possible. We discussed that he needs to get 64 ounces of liquid a day. We discussed proper eating techniques and behaviors and he was given educational material. At  this point I told him I wanted to him to stop taking his oral diabetes medication. He was instructed to make a followup appointment with his PCP.  His pathology report showed that he still had a positive H. Pylori stain despite preoperative treatment for this. I told him I would have to do some research to see how to eradicate it since he was already given a prevpac in the fall.   F/u 4 weeks  Mary SellaEric M. Andrey CampanileWilson, MD, FACS General, Bariatric, & Minimally Invasive Surgery [facility], [place]

## 2013-06-21 NOTE — Telephone Encounter (Signed)
Attempted DROP discharge call, left messages but never returned  Made discharge phone call to patient per DROP protocol. Asking the following questions.    1. Do you have someone to care for you now that you are home?   2. Are you having pain now that is not relieved by your pain medication?   3. Are you able to drink the recommended daily amount of fluids (48 ounces minimum/day) and protein (60-80 grams/day) as prescribed by the dietitian or nutritional counselor?   4. Are you taking the vitamins and minerals as prescribed?   5. Do you have the "on call" number to contact your Neyens if you have a problem or question?   6. Are your incisions free of redness, swelling or drainage? (If steri strips, address that these can fall off, shower as tolerated)  7. Have your bowels moved since your surgery?  If not, are you passing gas?   8. Are you up and walking 3-4 times per day?   9. Do you have an appointment to see a dietitian or nutritional counselor in the next month?    The following questions can be added at the center's discharge phone call script at the center's discretion.  The questions are also captured within [employer] project custom fields. 1. Do you have an appointment made to see your Alanis in the next month?   2. Were you provided your discharge medications before your surgery or before you were discharged from the hospital and are you taking them without problem?   3. Were you provided phone numbers to the clinic/[facility]'s office?   4. Did you watch the patient education video module in the (clinic, Murguia's office, etc.) before your surgery?  5. Do you have a discharge checklist that was provided to you in the hospital to reference with instructions on how to take care of yourself after surgery?   6. Did you see a dietitian or nutritional counselor while you were in the hospital?

## 2013-07-13 ENCOUNTER — Ambulatory Visit (INDEPENDENT_AMBULATORY_CARE_PROVIDER_SITE_OTHER): Payer: Medicare Other | Admitting: General Surgery

## 2013-07-22 ENCOUNTER — Ambulatory Visit (INDEPENDENT_AMBULATORY_CARE_PROVIDER_SITE_OTHER): Payer: Medicare Other | Admitting: General Surgery

## 2013-07-22 ENCOUNTER — Encounter (INDEPENDENT_AMBULATORY_CARE_PROVIDER_SITE_OTHER): Payer: Self-pay | Admitting: General Surgery

## 2013-07-22 VITALS — BP 124/80 | HR 82 | Temp 97.7°F | Resp 14 | Ht 74.0 in | Wt 316.4 lb

## 2013-07-22 DIAGNOSIS — Z09 Encounter for follow-up examination after completed treatment for conditions other than malignant neoplasm: Secondary | ICD-10-CM

## 2013-07-22 NOTE — Progress Notes (Signed)
Subjective:     Patient ID: Phillip Frye, male   DOB: [birth year], 43 y.o.   MRN: [id number]  HPI 43 year old morbidly obese African American male comes in for followup after undergoing laparoscopic sleeve gastrectomy on April 20. I last saw him in the office on May 6. His weight at that time was 335 pounds. His initial visit weight office was 369 pounds. His preoperative weight was 358 pounds. He states he has been doing well since his last visit. He denies any abdominal pain he denies any heartburn. He denies any regurgitation. He denies any diarrhea or constipation. He states he is taking his supplements. He reports more energy. He reports less hunger throughout the day. He is no longer taking any insulin for his diabetes. He also reports less snoring. He reports that food choices. He states however he probably should be exercising more. He recently started walking every day. Right now he is walking about one block  Review of Systems     Objective:   Physical Exam BP 124/80  Pulse 82  Temp(Src) 97.7 F (36.5 C) (Temporal)  Resp 14  Ht 6\' 2"  (1.88 m)  Wt 316 lb 6.4 oz (143.518 kg)  BMI 40.61 kg/m2  Gen: alert, NAD, non-toxic appearing Pupils: equal, no scleral icterus Pulm: Lungs clear to auscultation, symmetric chest rise CV: regular rate and rhythm Abd: soft, nontender, nondistended. Well-healed trocar sites. No cellulitis. No incisional hernia Ext: no edema, no calf tenderness Skin: no rash, no jaundice     Assessment:     Status post laparoscopic sleeve gastrectomy April 20 Hypertension Gastroesophageal reflux disease-improved Obstructive sleep apnea on CPAP Diabetes mellitus-improved     Plan:     I congratulated him on his weight loss. He has lost approximately 20 pounds since his last visit to the office which was a little over a month ago. Total weight loss since his initial visit has been 53 pounds. We did discuss the importance of daily activity and exercise. We  discussed how this is essential for long-term success as well as increase his current weight loss. We discussed the belt program. He was referred for the on-site belt program. We discussed importance of continuing to take his supplements. We discussed different exercise strategies.  I informed him that I would have to contact the pharmacist to discuss medications for treatment failure H. Pylori.  Followup in 3 months  Mary Sella. Andrey Campanile, MD, FACS General, Bariatric, & Minimally Invasive Surgery [facility], [place]

## 2013-07-22 NOTE — Patient Instructions (Signed)
Keep up the good work! Call the BELT program Work on exercising!

## 2013-07-28 ENCOUNTER — Ambulatory Visit: Payer: Medicare Other | Admitting: Dietician

## 2013-08-11 ENCOUNTER — Telehealth (INDEPENDENT_AMBULATORY_CARE_PROVIDER_SITE_OTHER): Payer: Self-pay

## 2013-08-11 NOTE — Telephone Encounter (Signed)
Pt called asking if drinking too much gatorade could cause him to have loose stools. Pt states he has been working outside this past 2 weeks and drinking a lot of gatorade. Pt states he has noticed he is have more BMs and they are loose. Pt states other wise he feels fine. Pt advised there is a increased sodium content in gatorade that may be causing him to have loose stools. Pt advised if this continues pt needs to contact his PCP to r/o any other cause.

## 2013-11-26 ENCOUNTER — Other Ambulatory Visit: Payer: Self-pay

## 2014-08-08 ENCOUNTER — Other Ambulatory Visit: Payer: Self-pay

## 2014-09-07 ENCOUNTER — Encounter: Payer: Self-pay | Admitting: *Deleted

## 2014-09-07 ENCOUNTER — Emergency Department (INDEPENDENT_AMBULATORY_CARE_PROVIDER_SITE_OTHER)
Admission: EM | Admit: 2014-09-07 | Discharge: 2014-09-07 | Disposition: A | Discharge status: Home / Self Care | Payer: Medicare Other | Source: Home / Self Care | Attending: Family Medicine | Admitting: Family Medicine

## 2014-09-07 DIAGNOSIS — I83899 Varicose veins of unspecified lower extremities with other complications: Secondary | ICD-10-CM

## 2014-09-07 DIAGNOSIS — I8390 Asymptomatic varicose veins of unspecified lower extremity: Secondary | ICD-10-CM

## 2014-09-07 DIAGNOSIS — I8393 Asymptomatic varicose veins of bilateral lower extremities: Secondary | ICD-10-CM

## 2014-09-07 DIAGNOSIS — I868 Varicose veins of other specified sites: Secondary | ICD-10-CM | POA: Diagnosis not present

## 2014-09-07 NOTE — ED Provider Notes (Signed)
CSN: [id number]     Arrival date & time 09/07/14  1643 History   First MD Initiated Contact with Patient 09/07/14 1646     Chief Complaint  Patient presents with  . Extremity Laceration      HPI Comments: While working 45 minutes prior, patient noticed blood on his right lower leg and thought that he had a laceration.  He wrapped a towel around the area and proceeded to our clinic.  He recalls no injury.  The history is provided by the patient.    Past Medical History  Diagnosis Date  . Arthritis   . Obesity   . Diabetes mellitus without complication   . 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 GERD (gastroesophageal reflux disease)   . Hypertension     MD stopped BP med 1 yr ago due to normal BP  . Dysrhythmia     unknown type  . Shortness of breath     with exertion  . Sleep apnea     uses c pap    Past Surgical History  Procedure Laterality Date  . Hip surgery Bilateral     to "shave gthe bones"  . Breath tek h pylori N/A 12/28/2012    Procedure: BREATH TEK H PYLORI;  Alwine: Atilano Ina, MD;  Location: [facility];  Service: General;  Laterality: N/A;  . Laparoscopic gastric sleeve resection N/A 05/31/2013    Procedure: LAPAROSCOPIC GASTRIC SLEEVE RESECTION;  Lemming: Atilano Ina, MD;  Location: [facility];  Service: General;  Laterality: N/A;   Family History  Problem Relation Age of Onset  . Cirrhosis Father   . Diabetes Other   . Diabetes Other   . Emphysema Paternal Grandfather     smoked   History  Substance Use Topics  . Smoking status: Current Every Day Smoker -- 0.05 packs/day for 6 years    Types: Cigars  . Smokeless tobacco: Never Used  . Alcohol Use: Yes     Comment: occasional    Review of Systems  Constitutional: Negative for fever, chills and fatigue.  HENT: Negative.   Eyes: Negative.   Respiratory: Negative.   Cardiovascular: Positive for leg swelling.  Gastrointestinal: Negative.   Genitourinary: Negative.    Musculoskeletal: Negative.   Skin:       Bleeding right lower leg  Neurological: Negative.     Allergies  Review of patient's allergies indicates no known allergies.  Home Medications   Prior to Admission medications   Medication Sig Start Date End Date Taking? Authorizing Provider  calcium-vitamin D (OSCAL WITH D) 500-200 MG-UNIT per tablet Take 1 tablet by mouth daily.     Historical Provider, MD  enoxaparin (LOVENOX) 40 MG/0.4ML injection Inject 0.4 mLs (40 mg total) into the skin every 12 (twelve) hours. 06/02/13   Gaynelle Adu, MD  fluticasone (FLONASE) 50 MCG/ACT nasal spray Place 1 spray into both nostrils daily.    Historical Provider, MD   BP 138/87 mmHg  Pulse 88  Temp(Src) 98.3 F (36.8 C) (Oral)  Resp 18  SpO2 98% Physical Exam Nursing notes and Vital Signs reviewed. Appearance:  Patient appears stated age, and in no acute distress Eyes:  Pupils are equal, round, and reactive to light and accomodation.  Extraocular movement is intact.  Conjunctivae are not inflamed  Pharynx:  Normal Neck:  Supple.  No adenopathy Lungs:  Clear to auscultation.  Breath sounds are equal.  Moving air well. Heart:  Regular rate and  rhythm without murmurs, rubs, or gallops.  Abdomen:  Nontender without masses or hepatosplenomegaly.  Bowel sounds are present.  No CVA or flank tenderness.  Extremities:  Trace edema.  No calf tenderness.  No erythema or warmth.  Pedal pulses intact.  Superficial varicosities present.  At demonstrated site of bleeding right calf, there is a small superficial varicose vein with no bleeding at present. Skin:  No rash present.   ED Course  Procedures    MDM   1. Superficial varicosities, bilateral  2.  Ruptured varicose vein   Compression bandage applied.  Recommend wearing medical compression stockings daytime (mild support: 8-15 mmHG) Followup with Family Doctor if symptoms persist.  May need evaluation by vein specialist and possible  sclerotherapy.    Lattie Haw, MD 09/11/14 [phone]

## 2014-09-07 NOTE — Discharge Instructions (Signed)
Recommend wearing medical compression stockings daytime (mild support: 8-15 mmHG)   Varicose Veins Varicose veins are veins that have become enlarged and twisted. CAUSES This condition is the result of valves in the veins not working properly. Valves in the veins help return blood from the leg to the heart. If these valves are damaged, blood flows backwards and backs up into the veins in the leg near the skin. This causes the veins to become larger. People who are on their feet a lot, who are pregnant, or who are overweight are more likely to develop varicose veins. SYMPTOMS   Bulging, twisted-appearing, bluish veins, most commonly found on the legs.  Leg pain or a feeling of heaviness. These symptoms may be worse at the end of the day.  Leg swelling.  Skin color changes. DIAGNOSIS  Varicose veins can usually be diagnosed with an exam of your legs by your caregiver. He or she may recommend an ultrasound of your leg veins. TREATMENT  Most varicose veins can be treated at home.However, other treatments are available for people who have persistent symptoms or who want to treat the cosmetic appearance of the varicose veins. These include:  Laser treatment of very small varicose veins.  Medicine that is shot (injected) into the vein. This medicine hardens the walls of the vein and closes off the vein. This treatment is called sclerotherapy. Afterwards, you may need to wear clothing or bandages that apply pressure.  Surgery. HOME CARE INSTRUCTIONS   Do not stand or sit in one position for long periods of time. Do not sit with your legs crossed. Rest with your legs raised during the day.  Wear elastic stockings or support hose. Do not wear other tight, encircling garments around the legs, pelvis, or waist.  Walk as much as possible to increase blood flow.  Raise the foot of your bed at night with 2-inch blocks.  If you get a cut in the skin over the vein and the vein bleeds, lie down  with your leg raised and press on it with a clean cloth until the bleeding stops. Then place a bandage (dressing) on the cut. See your caregiver if it continues to bleed or needs stitches. SEEK MEDICAL CARE IF:   The skin around your ankle starts to break down.  You have pain, redness, tenderness, or hard swelling developing in your leg over a vein.  You are uncomfortable due to leg pain. Document Released: 11/07/2004 Document Revised: 04/22/2011 Document Reviewed: 03/26/2010 [facility] Patient Information 2015 [place], [place]. This information is not intended to replace advice given to you by your health care provider. Make sure you discuss any questions you have with your health care provider.   Endovenous Ablation Endovenous ablation is a procedure that seals off an abnormally enlarged leg vein (varicose vein). This procedure uses heat from radiofrequency waves or a laser to seal off the affected vein. This procedure may be done if the vein is causing pain, swelling, sores on the skin (ulcers), or skin discoloration. LET YOUR CAREGIVER KNOW ABOUT:   Allergies to food or medicine.  Medicines taken, including vitamins, herbs, over-the-counter medicines, and creams.  Use of steroids (by mouth or creams).  Previous problems with anesthetics or numbing medicines.  History of bleeding problems or blood clots.  Previous surgery.  Other health problems, including diabetes and kidney problems.  Possibility of pregnancy, if this applies. RISKS AND COMPLICATIONS   Bleeding.  Infection.  Numbness or tingling along the leg. This is uncommon  and is usually temporary.  Vein swelling. This is usually temporary. BEFORE THE PROCEDURE   Blood tests may be done to make sure your blood clots normally.  You may need to stop taking blood thinners, aspirin, and nonsteroidal anti-inflammatory drugs (such as ibuprofen) before the procedure. Ask your caregiver when you can start taking them  again.  Follow your caregiver's diet instructions. You may need to stop eating and drinking for 4 hours before the procedure. PROCEDURE  This procedure usually lasts about 1 hour.  You will lie on an exam table. Your leg will be cleaned and shaved.  Ultrasonography will be used to help your caregiver see the leg veins during the procedure.  You will be given medicine that numbs the area (local anesthetic).  A tiny cut (incision) is made close to the area that will be treated. A narrow tube (catheter) is then slipped through the incision and into the vein.  Tiny sensors (electrodes or laser fibers) are slipped through the catheter and into the vein.  Radiofrequency or laser energy is sent through the sensors to burn the vein, sealing it off.  The electrodes, laser fibers, and catheter are then removed from the vein.  A bandage is placed over the incision. AFTER THE PROCEDURE  You will wear a compression stocking on your leg. This helps to reduce bruising and prevent blood clots from forming.  You will be encouraged to walk around immediately after the procedure.  Arrange for someone to drive you home after the procedure. Document Released: 01/17/2011 Document Revised: 04/22/2011 Document Reviewed: 01/17/2011 [facility] Patient Information 2015 [place], [place]. This information is not intended to replace advice given to you by your health care provider. Make sure you discuss any questions you have with your health care provider.   Compression Stockings Compression stockings are elastic stockings that "compress" your legs. This helps to increase blood flow, decrease swelling, and reduces the chance of getting blood clots in your lower legs. Compression stockings are used:  After surgery.  If you have a history of poor circulation.  If you are prone to blood clots.  If you have varicose veins.  If you sit or are bedridden for long periods of time. WEARING COMPRESSION  STOCKINGS  Your compression stockings should be worn as instructed by your caregiver.  Wearing the correct stocking size is important. Your caregiver can help measure and fit you to the correct size.  When wearing your stockings, do not allow the stockings to bunch up. This is especially important around your toes or behind your knees. Keep the stockings as smooth as possible.  Do not roll the stockings downward and leave them rolled down. This can form a restrictive band around your legs and can decrease blood flow.  The stockings should be removed once a day for 1 hour or as instructed by your caregiver. When the stockings are taken off, inspect your legs and feet. Look for:  Open sores.  Red spots.  Puffy areas (swelling).  Anything that does not seem normal. IMPORTANT INFORMATION ABOUT COMPRESSION STOCKINGS  The compression stockings should be clean, dry, and in good condition before you put them on.  Do not put lotion on your legs or feet. This makes it harder to put the stockings on.  Change your stockings immediately if they become wet or soiled.  Do not wear stockings that are ripped or torn.  You may hand-wash or put your stockings in the washing machine. Use cold or warm water  with mild detergent. Do not bleach your stockings. They may be air-dried or dried in the dryer on low heat.  If you have pain or have a feeling of "pins and needles" in your feet or legs, you may be wearing stockings that are too tight. Call your caregiver right away. SEEK IMMEDIATE MEDICAL CARE IF:   You have numbness or tingling in your lower legs that does not get better quickly after the stockings are removed.  Your toes or feet become cold and blue.  You develop open sores or have red spots on your legs that do not go away. MAKE SURE YOU:   Understand these instructions.  Will watch your condition.  Will get help right away if you are not doing well or get worse. Document Released:  11/25/2008 Document Revised: 04/22/2011 Document Reviewed: 11/25/2008 [facility] Patient Information 2015 [place], [place]. This information is not intended to replace advice given to you by your health care provider. Make sure you discuss any questions you have with your health care provider.

## 2014-09-07 NOTE — ED Notes (Signed)
Pt c/o RLE bleeding x 45 minutes ago. He denies injury.

## 2014-09-27 ENCOUNTER — Other Ambulatory Visit: Payer: Self-pay | Admitting: *Deleted

## 2014-09-27 DIAGNOSIS — I83893 Varicose veins of bilateral lower extremities with other complications: Secondary | ICD-10-CM

## 2014-11-07 ENCOUNTER — Encounter: Payer: Medicare Other | Admitting: Vascular Surgery

## 2014-11-14 ENCOUNTER — Encounter (HOSPITAL_COMMUNITY): Payer: Medicare Other

## 2014-11-14 ENCOUNTER — Encounter: Payer: Medicare Other | Admitting: Surgery

## 2015-02-22 ENCOUNTER — Emergency Department (HOSPITAL_COMMUNITY)
Admission: EM | Admit: 2015-02-22 | Discharge: 2015-02-22 | Discharge status: Absent without leave | Payer: Medicare Other | Source: Home / Self Care

## 2015-11-09 ENCOUNTER — Encounter (HOSPITAL_COMMUNITY): Payer: Self-pay | Admitting: Emergency Medicine

## 2015-11-09 ENCOUNTER — Ambulatory Visit (HOSPITAL_COMMUNITY)
Admission: EM | Admit: 2015-11-09 | Discharge: 2015-11-09 | Disposition: A | Discharge status: Home / Self Care | Payer: Medicare Other | Attending: Emergency Medicine | Admitting: Emergency Medicine

## 2015-11-09 DIAGNOSIS — I10 Essential (primary) hypertension: Secondary | ICD-10-CM

## 2015-11-09 MED ORDER — AMLODIPINE BESYLATE 5 MG PO TABS: 5.0000 mg | ORAL_TABLET | Freq: Every day | ORAL | 0 refills | Status: DC

## 2015-11-09 NOTE — ED Triage Notes (Signed)
Pt is here for medical clearance on BP... Reports BP was elevated yest and was told to come here today  Pt is asymptomatic   Smokes 5 black and mild per day  Denies PMhx but on EPIC it states hx of DM and HTN  A&O x4... NAD

## 2015-11-09 NOTE — Discharge Instructions (Signed)
You blood pressure was elevated earlier this week and is borderline today. We will start you on blood pressure medication today. You need to follow-up with a PCP for continued monitoring of blood pressure and medication management.

## 2015-11-10 NOTE — ED Provider Notes (Signed)
CSN: [id number]     Arrival date & time 11/09/15  1125 History   First MD Initiated Contact with Patient 11/09/15 1258     Chief Complaint  Patient presents with  . Medical Clearance   (Consider location/radiation/quality/duration/timing/severity/associated sxs/prior Treatment) 45 year old male presents with elevated blood pressure on pre-employment exam 4 days ago. Blood pressure was >160/>100. 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 Has history of high blood pressure but had gastric sleeve 2 years ago. Has lost 25 pounds. Was on medication for hypertension in the past but uncertain of name. Continues to smoke daily. Take no daily medication but does have a history of DM and pulmonary emboli. Asymptomatic today. Has not seen his PCP in 2 years.    The history is provided by the patient.    Past Medical History:  Diagnosis Date  . Arthritis   . Diabetes mellitus without complication (HCC)   . Dysrhythmia    unknown type  . GERD (gastroesophageal reflux disease)   . Hypertension    MD stopped BP med 1 yr ago due to normal BP  . Obesity   . Pulmonary embolism, bilateral (HCC) 09/2012   on xarelto  . Shortness of breath    with exertion  . Sleep apnea    uses c pap    Past Surgical History:  Procedure Laterality Date  . BREATH TEK H PYLORI N/A 12/28/2012   Procedure: BREATH TEK H PYLORI;  Karnik: Atilano Ina, MD;  Location: [facility];  Service: General;  Laterality: N/A;  . HIP SURGERY Bilateral    to "shave gthe bones"  . LAPAROSCOPIC GASTRIC SLEEVE RESECTION N/A 05/31/2013   Procedure: LAPAROSCOPIC GASTRIC SLEEVE RESECTION;  Feigenbaum: Atilano Ina, MD;  Location: [facility];  Service: General;  Laterality: N/A;   Family History  Problem Relation Age of Onset  . Cirrhosis Father   . Diabetes Other   . Diabetes Other   . Emphysema Paternal Grandfather     smoked   Social History  Substance Use Topics  . Smoking status: Current Every Day Smoker     Packs/day: 1.00    Years: 6.00    Types: Cigars  . Smokeless tobacco: Never Used  . Alcohol use Yes     Comment: occasional    Review of Systems  Constitutional: Negative for activity change, fatigue and unexpected weight change.  Respiratory: Negative for cough, chest tightness and shortness of breath.   Cardiovascular: Negative for chest pain and palpitations.  Neurological: Negative for dizziness, syncope, weakness, light-headedness and headaches.  Hematological: Negative for adenopathy. Does not bruise/bleed easily.    Allergies  Review of patient's allergies indicates no known allergies.  Home Medications   Prior to Admission medications   Medication Sig Start Date End Date Taking? Authorizing Provider  amLODipine (NORVASC) 5 MG tablet Take 1 tablet (5 mg total) by mouth daily. 11/09/15   Sudie Grumbling, NP  calcium-vitamin D (OSCAL WITH D) 500-200 MG-UNIT per tablet Take 1 tablet by mouth daily.     Historical Provider, MD   Meds Ordered and Administered this Visit  Medications - No data to display  BP 136/89 (BP Location: Left Arm)   Pulse 81   Temp 98.4 F (36.9 C) (Oral)   Resp 20   SpO2 99%  No data found.   Physical Exam  Constitutional: He is oriented to person, place, and time. He appears well-developed and well-nourished. No distress.  HENT:  Head: Normocephalic and atraumatic.  Nose: Nose normal.  Mouth/Throat: Oropharynx is clear and moist.  Eyes: Conjunctivae and EOM are normal. Pupils are equal, round, and reactive to light.  Neck: Normal range of motion. Neck supple.  Cardiovascular: Normal rate, regular rhythm, normal heart sounds and intact distal pulses.   Pulmonary/Chest: Effort normal and breath sounds normal.  Lymphadenopathy:    He has no cervical adenopathy.  Neurological: He is alert and oriented to person, place, and time.  Skin: Skin is warm and dry. Capillary refill takes less than 2 seconds.  Psychiatric: He has a normal mood and  affect. His behavior is normal. Judgment and thought content normal.    Urgent Care Course   Clinical Course    Procedures (including critical care time)  Labs Review Labs Reviewed - No data to display  Imaging Review No results found.   Visual Acuity Review  Right Eye Distance:   Left Eye Distance:   Bilateral Distance:    Right Eye Near:   Left Eye Near:    Bilateral Near:         MDM   1. Essential hypertension    Previously on ?Metoprolol. Recommend start Norvasc 5mg  daily #30 with no refills written. Patient needs to see his PCP for further evaluation including blood work to determine renal function as well as additional need for medication for diabetes. Work note written that he has started medication since blood pressure is still slightly elevated today. He will need to see and follow-up with his PCP for further maintenance and treatment.     Sudie GrumblingAnn Berry Amyot, NP 11/10/15 [phone]

## 2015-12-21 ENCOUNTER — Encounter (HOSPITAL_COMMUNITY): Payer: Self-pay

## 2016-12-31 ENCOUNTER — Encounter (HOSPITAL_COMMUNITY): Payer: Self-pay

## 2017-02-12 ENCOUNTER — Encounter: Payer: Self-pay | Admitting: Emergency Medicine

## 2017-02-12 ENCOUNTER — Ambulatory Visit (INDEPENDENT_AMBULATORY_CARE_PROVIDER_SITE_OTHER): Payer: Self-pay | Admitting: Emergency Medicine

## 2017-02-12 VITALS — BP 135/100 | HR 93 | Temp 97.9°F | Resp 16 | Wt 366.6 lb

## 2017-02-12 DIAGNOSIS — L03115 Cellulitis of right lower limb: Secondary | ICD-10-CM

## 2017-02-12 DIAGNOSIS — L309 Dermatitis, unspecified: Secondary | ICD-10-CM

## 2017-02-12 DIAGNOSIS — Z760 Encounter for issue of repeat prescription: Secondary | ICD-10-CM

## 2017-02-12 MED ORDER — CEPHALEXIN 500 MG PO CAPS: 500.0000 mg | ORAL_CAPSULE | Freq: Four times a day (QID) | ORAL | 0 refills | Status: DC

## 2017-02-12 MED ORDER — TRIAMCINOLONE ACETONIDE 0.1 % EX OINT: 1.0000 "application " | TOPICAL_OINTMENT | Freq: Two times a day (BID) | CUTANEOUS | 1 refills | Status: DC

## 2017-02-12 MED ORDER — AMLODIPINE BESYLATE 5 MG PO TABS: 5.0000 mg | ORAL_TABLET | Freq: Every day | ORAL | 2 refills | Status: DC

## 2017-02-12 NOTE — Patient Instructions (Signed)
Rash A rash is a change in the color of the skin. A rash can also change the way your skin feels. There are many different conditions and factors that can cause a rash. Follow these instructions at home: Pay attention to any changes in your symptoms. Follow these instructions to help with your condition: Medicine Take or apply over-the-counter and prescription medicines only as told by your doctor. These may include:  Corticosteroid cream.  Anti-itch lotions.  Oral antihistamines.  Skin Care  Put cool compresses on the affected areas.  Try taking a bath with: ? Epsom salts. Follow the instructions on the packaging. You can get these at your local pharmacy or grocery store. ? Baking soda. Pour a small amount into the bath as told by your doctor. ? Colloidal oatmeal. Follow the instructions on the packaging. You can get this at your local pharmacy or grocery store.  Try putting baking soda paste onto your skin. Stir water into baking soda until it gets like a paste.  Do not scratch or rub your skin.  Avoid covering the rash. Make sure the rash is exposed to air as much as possible. General instructions  Avoid hot showers or baths, which can make itching worse. A cold shower may help.  Avoid scented soaps, detergents, and perfumes. Use gentle soaps, detergents, perfumes, and other cosmetic products.  Avoid anything that causes your rash. Keep a journal to help track what causes your rash. Write down: ? What you eat. ? What cosmetic products you use. ? What you drink. ? What you wear. This includes jewelry.  Keep all follow-up visits as told by your doctor. This is important. Contact a doctor if:  You sweat at night.  You lose weight.  You pee (urinate) more than normal.  You feel weak.  You throw up (vomit).  Your skin or the whites of your eyes look yellow (jaundice).  Your skin: ? Tingles. ? Is numb.  Your rash: ? Does not go away after a few days. ? Gets  worse.  You are: ? More thirsty than normal. ? More tired than normal.  You have: ? New symptoms. ? Pain in your belly (abdomen). ? A fever. ? Watery poop (diarrhea). Get help right away if:  Your rash covers all or most of your body. The rash may or may not be painful.  You have blisters that: ? Are on top of the rash. ? Grow larger. ? Grow together. ? Are painful. ? Are inside your nose or mouth.  You have a rash that: ? Looks like purple pinprick-sized spots all over your body. ? Has a "bull's eye" or looks like a target. ? Is red and painful, causes your skin to peel, and is not from being in the sun too long. This information is not intended to replace advice given to you by your health care provider. Make sure you discuss any questions you have with your health care provider. Document Released: 07/17/2007 Document Revised: 07/06/2015 Document Reviewed: 06/15/2014 Elsevier Interactive Patient Education  2018 [employer]. Hypertension Hypertension is another name for high blood pressure. High blood pressure forces your heart to work harder to pump blood. This can cause problems over time. There are two numbers in a blood pressure reading. There is a top number (systolic) over a bottom number (diastolic). It is best to have a blood pressure below 120/80. Healthy choices can help lower your blood pressure. You may need medicine to help lower your blood pressure  if:  Your blood pressure cannot be lowered with healthy choices.  Your blood pressure is higher than 130/80.  Follow these instructions at home: Eating and drinking  If directed, follow the DASH eating plan. This diet includes: ? Filling half of your plate at each meal with fruits and vegetables. ? Filling one quarter of your plate at each meal with whole grains. Whole grains include whole wheat pasta, brown rice, and whole grain bread. ? Eating or drinking low-fat dairy products, such as skim milk or low-fat  yogurt. ? Filling one quarter of your plate at each meal with low-fat (lean) proteins. Low-fat proteins include fish, skinless chicken, eggs, beans, and tofu. ? Avoiding fatty meat, cured and processed meat, or chicken with skin. ? Avoiding premade or processed food.  Eat less than 1,500 mg of salt (sodium) a day.  Limit alcohol use to no more than 1 drink a day for nonpregnant women and 2 drinks a day for men. One drink equals 12 oz of beer, 5 oz of wine, or 1 oz of hard liquor. Lifestyle  Work with your doctor to stay at a healthy weight or to lose weight. Ask your doctor what the best weight is for you.  Get at least 30 minutes of exercise that causes your heart to beat faster (aerobic exercise) most days of the week. This may include walking, swimming, or biking.  Get at least 30 minutes of exercise that strengthens your muscles (resistance exercise) at least 3 days a week. This may include lifting weights or pilates.  Do not use any products that contain nicotine or tobacco. This includes cigarettes and e-cigarettes. If you need help quitting, ask your doctor.  Check your blood pressure at home as told by your doctor.  Keep all follow-up visits as told by your doctor. This is important. Medicines  Take over-the-counter and prescription medicines only as told by your doctor. Follow directions carefully.  Do not skip doses of blood pressure medicine. The medicine does not work as well if you skip doses. Skipping doses also puts you at risk for problems.  Ask your doctor about side effects or reactions to medicines that you should watch for. Contact a doctor if:  You think you are having a reaction to the medicine you are taking.  You have headaches that keep coming back (recurring).  You feel dizzy.  You have swelling in your ankles.  You have trouble with your vision. Get help right away if:  You get a very bad headache.  You start to feel confused.  You feel weak or  numb.  You feel faint.  You get very bad pain in your: ? Chest. ? Belly (abdomen).  You throw up (vomit) more than once.  You have trouble breathing. Summary  Hypertension is another name for high blood pressure.  Making healthy choices can help lower blood pressure. If your blood pressure cannot be controlled with healthy choices, you may need to take medicine. This information is not intended to replace advice given to you by your health care provider. Make sure you discuss any questions you have with your health care provider. Document Released: 07/17/2007 Document Revised: 12/27/2015 Document Reviewed: 12/27/2015 Elsevier Interactive Patient Education  [employer]2018 [employer] Medicine Refill at the Emergency Department We have refilled your medicine today, but it is best for you to get refills through your primary health care provider's office. In the future, please plan ahead so you do not need to get refills from  the emergency department. If the medicine we refilled was a maintenance medicine, you may have received only enough to get you by until you are able to see your regular health care provider. This information is not intended to replace advice given to you by your health care provider. Make sure you discuss any questions you have with your health care provider. Document Released: 05/17/2003 Document Revised: 01/12/2016 Document Reviewed: 05/07/2013 Elsevier Interactive Patient Education  2018 [employer].

## 2017-02-12 NOTE — Progress Notes (Signed)
Subjective:     Phillip Frye is a 47 y.o. male who presents for evaluation of a rash involving the leg and upper extremity. Rash started 3 weeks ago. Lesions are white and red, and raised in texture. Rash has changed over time. Rash is pruritic. Associated symptoms: none. Patient denies: abdominal pain, arthralgia, decrease in appetite, decrease in energy level and fever. Patient has not had contacts with similar rash. Patient has not had new exposures (soaps, lotions, laundry detergents, foods, medications, plants, insects or animals).  Patient also requests medication refill, has past history of HTN, reports being controlled on amlodipine, has made appointment with a new PCP in march. Denies CP, SOB, N/V, weakness, dizziness, headache, or other symptoms.    Objective:   Review of Systems  Constitutional: Negative for chills, fever and weight loss.  HENT: Negative.   Respiratory: Negative.   Cardiovascular: Negative.   Gastrointestinal: Negative for constipation, diarrhea, nausea and vomiting.  Genitourinary: Negative.   Skin: Positive for itching and rash.  Neurological: Negative.   Endo/Heme/Allergies: Negative for polydipsia. Does not bruise/bleed easily.    Vitals:   02/12/17 1429  BP: (!) 135/100  Pulse: 93  Resp: 16  Temp: 97.9 F (36.6 C)     Physical Exam  Constitutional: He appears well-developed and well-nourished. No distress.  HENT:  Head: Normocephalic and atraumatic.  Right Ear: External ear normal.  Left Ear: External ear normal.  Cardiovascular: Normal rate and regular rhythm.  Pulmonary/Chest: Effort normal and breath sounds normal.  Neurological: He is alert.  Skin: Skin is warm. Capillary refill takes less than 2 seconds. Rash noted. Rash is maculopapular. He is not diaphoretic. There is erythema.  Nursing note and vitals reviewed.    Assessment:   dermatitis and with probable underlying cellulitis   Medication refill for Hypertension    Plan:    1. Medication refill -Diet, weight loss, stop smoking, reduce salt intake, go to the ER for headache, dizziness, blurred vision, chest pain, or shortness of breath - amLODipine (NORVASC) 5 MG tablet; Take 1 tablet (5 mg total) by mouth daily.  Dispense: 30 tablet; Refill: 2  2. Dermatitis  - triamcinolone ointment (KENALOG) 0.1 %; Apply 1 application topically 2 (two) times daily.  Dispense: 30 g; Refill: 1  3. Cellulitis of right lower extremity  - cephALEXin (KEFLEX) 500 MG capsule; Take 1 capsule (500 mg total) by mouth 4 (four) times daily.  Dispense: 40 capsule; Refill: 0

## 2017-02-14 ENCOUNTER — Telehealth: Payer: Self-pay

## 2017-02-14 NOTE — Telephone Encounter (Signed)
Called to follow up with patient to see how he was feeling and pt states he is doing a lot better.

## 2017-02-26 ENCOUNTER — Other Ambulatory Visit: Payer: Self-pay | Admitting: Podiatry

## 2017-02-26 ENCOUNTER — Encounter: Payer: Self-pay | Admitting: Podiatry

## 2017-02-26 ENCOUNTER — Ambulatory Visit (INDEPENDENT_AMBULATORY_CARE_PROVIDER_SITE_OTHER): Payer: 59

## 2017-02-26 ENCOUNTER — Ambulatory Visit: Payer: 59 | Admitting: Podiatry

## 2017-02-26 DIAGNOSIS — B351 Tinea unguium: Secondary | ICD-10-CM

## 2017-02-26 DIAGNOSIS — M79672 Pain in left foot: Secondary | ICD-10-CM | POA: Diagnosis not present

## 2017-02-26 DIAGNOSIS — M779 Enthesopathy, unspecified: Secondary | ICD-10-CM | POA: Diagnosis not present

## 2017-02-26 MED ORDER — TERBINAFINE HCL 250 MG PO TABS: 250.0000 mg | ORAL_TABLET | Freq: Every day | ORAL | 0 refills | Status: DC

## 2017-02-26 NOTE — Progress Notes (Signed)
   Subjective:    Patient ID: Phillip Frye, male    DOB: 10/12/1970, 47 y.o.   MRN: [id number]  HPI    Review of Systems  All other systems reviewed and are negative.      Objective:   Physical Exam        Assessment & Plan:

## 2017-02-27 LAB — HEPATIC FUNCTION PANEL
AG Ratio: 1.4 (calc) (ref 1.0–2.5)
ALT: 28 U/L (ref 9–46)
AST: 21 U/L (ref 10–40)
Albumin: 4 g/dL (ref 3.6–5.1)
Alkaline phosphatase (APISO): 70 U/L (ref 40–115)
Bilirubin, Direct: 0.1 mg/dL (ref 0.0–0.2)
Globulin: 2.9 g/dL (calc) (ref 1.9–3.7)
Indirect Bilirubin: 0.4 mg/dL (calc) (ref 0.2–1.2)
Total Bilirubin: 0.5 mg/dL (ref 0.2–1.2)
Total Protein: 6.9 g/dL (ref 6.1–8.1)

## 2017-02-27 NOTE — Progress Notes (Signed)
Subjective:   Patient ID: Phillip Frye, male   DOB: 47 y.o.   MRN: [id number]   HPI Patient presents with nail disease 1-5 both feet that are thick and crumbly and also thick skin which is hard for him to take care of himself.  Patient does not smoke currently except for cigars once a day and also likes to be active    Review of Systems  All other systems reviewed and are negative.       Objective:  Physical Exam  Constitutional: He appears well-developed and well-nourished.  Cardiovascular: Intact distal pulses.  Pulmonary/Chest: Effort normal.  Musculoskeletal: Normal range of motion.  Neurological: He is alert.  Skin: Skin is warm.  Nursing note and vitals reviewed.   Neurovascular status intact muscle strength is adequate patient found to have nail disease 1-5 both feet that are thick yellow and brittle and also skin that shows mycotic fungal type involvement but no other pathology.     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  I have recommended we try oral medication explained it may help his skin and his nails and I explained the risk of Lamisil and today placed him on Lamisil 250 mg daily and did order liver function studies.  Patient will be seen back to recheck and is encouraged to call with any questions

## 2017-03-21 ENCOUNTER — Emergency Department (HOSPITAL_COMMUNITY): Payer: 59

## 2017-03-21 ENCOUNTER — Encounter (HOSPITAL_COMMUNITY): Payer: Self-pay

## 2017-03-21 ENCOUNTER — Inpatient Hospital Stay (HOSPITAL_COMMUNITY): Admit: 2017-03-21 | Payer: Medicare Other

## 2017-03-21 ENCOUNTER — Emergency Department (HOSPITAL_COMMUNITY)
Admission: EM | Admit: 2017-03-21 | Discharge: 2017-03-21 | Disposition: A | Discharge status: Home / Self Care | Payer: 59 | Attending: Emergency Medicine | Admitting: Emergency Medicine

## 2017-03-21 ENCOUNTER — Ambulatory Visit (HOSPITAL_COMMUNITY)
Admission: EM | Admit: 2017-03-21 | Discharge: 2017-03-21 | Disposition: A | Discharge status: Home / Self Care | Payer: Medicare Other | Source: Home / Self Care

## 2017-03-21 ENCOUNTER — Emergency Department (HOSPITAL_BASED_OUTPATIENT_CLINIC_OR_DEPARTMENT_OTHER)
Admit: 2017-03-21 | Discharge: 2017-03-21 | Disposition: A | Discharge status: Home / Self Care | Payer: 59 | Attending: Emergency Medicine | Admitting: Emergency Medicine

## 2017-03-21 DIAGNOSIS — M79609 Pain in unspecified limb: Secondary | ICD-10-CM

## 2017-03-21 DIAGNOSIS — E119 Type 2 diabetes mellitus without complications: Secondary | ICD-10-CM | POA: Diagnosis not present

## 2017-03-21 DIAGNOSIS — M25551 Pain in right hip: Secondary | ICD-10-CM | POA: Diagnosis not present

## 2017-03-21 DIAGNOSIS — F17290 Nicotine dependence, other tobacco product, uncomplicated: Secondary | ICD-10-CM | POA: Insufficient documentation

## 2017-03-21 DIAGNOSIS — M79604 Pain in right leg: Secondary | ICD-10-CM | POA: Diagnosis not present

## 2017-03-21 DIAGNOSIS — Z79899 Other long term (current) drug therapy: Secondary | ICD-10-CM | POA: Diagnosis not present

## 2017-03-21 DIAGNOSIS — I10 Essential (primary) hypertension: Secondary | ICD-10-CM | POA: Insufficient documentation

## 2017-03-21 DIAGNOSIS — M7989 Other specified soft tissue disorders: Secondary | ICD-10-CM | POA: Diagnosis not present

## 2017-03-21 NOTE — ED Notes (Signed)
Pt verbalized understanding of discharge instructions and denies any further questions at this time.   

## 2017-03-21 NOTE — Progress Notes (Signed)
*  PRELIMINARY RESULTS* Vascular Ultrasound Right lower extremity venous duplex has been completed.  Preliminary findings: No evidence of deep vein thrombosis in the visualized veins or baker's cyst in the right lower extremity.  Moderate amount of fluid seen anterior and lateral to right knee, area of complaint.   Chauncey FischerCharlotte C Aicia Babinski 03/21/2017, 4:10 PM

## 2017-03-21 NOTE — ED Provider Notes (Signed)
[facility]  CSN: [id number] Arrival date & time: 03/21/17 1309  Chief Complaint(s) Leg Pain  HPI Jones SkeneKarl A Stroder is a 47 y.o. 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  Exacerbated with movement and palpation.  No alleviating factors.  Denies any recent travels or surgeries.  No history of cancer.  He denies any trauma.  Evaluated at urgent care center and sent here to rule out DVT.  HPI  Past Medical History Past Medical History:  Diagnosis Date  . Arthritis   . Diabetes mellitus without complication (HCC)   . Dysrhythmia    unknown type  . GERD (gastroesophageal reflux disease)   . Hypertension    MD stopped BP med 1 yr ago due to normal BP  . Obesity   . Pulmonary embolism, bilateral (HCC) 09/2012   on xarelto  . Shortness of breath    with exertion  . Sleep apnea    uses c pap    Patient Active Problem List   Diagnosis Date Noted  . 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 Abnormal EKG 12/23/2012  . Obesity, Class III, BMI 40-49.9 (morbid obesity) (HCC) 12/11/2012  . SOB (shortness of breath) 09/22/2012  . Pulmonary embolism, bilateral (HCC) 09/22/2012  . Diabetes mellitus without complication (HCC)   . DENTAL PAIN 03/31/2008  . CARPAL TUNNEL SYNDROME, BILATERAL 10/09/2007  . ULNAR NEUROPATHY 10/09/2007  . DENTAL CARIES 10/09/2007  . GERD 10/09/2007  . INGUINAL PAIN, BILATERAL 02/26/2007  . KNEE PAIN, RIGHT 12/19/2006  . FOOT PAIN, BILATERAL 12/19/2006  . HYPERTENSION 09/18/2006  . AVASCULAR NECROSIS 09/18/2006  . SLEEP APNEA 09/18/2006   Home Medication(s) Prior to Admission medications   Medication Sig Start Date End Date Taking? Authorizing Provider  amLODipine (NORVASC) 5 MG tablet Take  1 tablet (5 mg total) by mouth daily. 02/12/17   Dorena BodoKennard, Lawrence, NP  calcium-vitamin D (OSCAL WITH D) 500-200 MG-UNIT per tablet Take 1 tablet by mouth daily.     [provider]  cephALEXin (KEFLEX) 500 MG capsule Take 1 capsule (500 mg total) by mouth 4 (four) times daily. 02/12/17   Dorena BodoKennard, Lawrence, NP  terbinafine (LAMISIL) 250 MG tablet Take 1 tablet (250 mg total) by mouth daily. 02/26/17   Lenn Sinkegal, Norman S, DPM  triamcinolone ointment (KENALOG) 0.1 % Apply 1 application topically 2 (two) times daily. 02/12/17   Dorena BodoKennard, Lawrence, NP  metoprolol (LOPRESSOR) 50 MG tablet Take 50 mg by mouth 2 (two) times daily.    04/21/11  [provider]  Past Surgical History Past Surgical History:  Procedure Laterality Date  . BREATH TEK H PYLORI N/A 12/28/2012   Procedure: BREATH TEK H PYLORI;  Wyche: Atilano Ina, MD;  Location: [facility];  Service: General;  Laterality: N/A;  . HIP SURGERY Bilateral    to "shave gthe bones"  . LAPAROSCOPIC GASTRIC SLEEVE RESECTION N/A 05/31/2013   Procedure: LAPAROSCOPIC GASTRIC SLEEVE RESECTION;  Chaplin: Atilano Ina, MD;  Location: [facility];  Service: General;  Laterality: N/A;   Family History Family History  Problem Relation Age of Onset  . Cirrhosis Father   . Diabetes Other   . Diabetes Other   . Emphysema Paternal Grandfather        smoked    Social History Social History   Tobacco Use  . Smoking status: Current Every Day Smoker    Packs/day: 1.00    Years: 6.00    Pack years: 6.00    Types: Cigars  . Smokeless tobacco: Never Used  Substance Use Topics  . Alcohol use: Yes    Comment: occasional  . Drug use: No   Allergies Patient has no known allergies.  Review of Systems Review of Systems All other systems are reviewed and are negative for acute change except as noted in the  HPI  Physical Exam Vital Signs  I have reviewed the triage vital signs BP (!) 143/83   Pulse 88   Temp 97.7 F (36.5 C) (Oral)   Resp 20   Ht 6\' 2"  (1.88 m)   Wt (!) 172.4 kg (380 lb)   SpO2 97%   BMI 48.79 kg/m   Physical Exam  Constitutional: He is oriented to person, place, and time. He appears well-developed and well-nourished. No distress.  HENT:  Head: Normocephalic and atraumatic.  Right Ear: External ear normal.  Left Ear: External ear normal.  Nose: Nose normal.  Mouth/Throat: Mucous membranes are normal. No trismus in the jaw.  Eyes: Conjunctivae and EOM are normal. No scleral icterus.  Neck: Normal range of motion and phonation normal.  Cardiovascular: Normal rate and regular rhythm.  Pulmonary/Chest: Effort normal. No stridor. No respiratory distress.  Abdominal: He exhibits no distension.  Musculoskeletal: Normal range of motion. He exhibits no edema.       Right hip: He exhibits tenderness. He exhibits no bony tenderness.       Legs: 1+ bilateral lower extremity edema  Neurological: He is alert and oriented to person, place, and time.  Skin: He is not diaphoretic.  Psychiatric: He has a normal mood and affect. His behavior is normal.  Vitals reviewed.   ED Results and Treatments Labs (all labs ordered are listed, but only abnormal results are displayed) Labs Reviewed - No data to display                                                                                                                       EKG  EKG Interpretation  Date/Time:    Ventricular Rate:  PR Interval:    QRS Duration:   QT Interval:    QTC Calculation:   R Axis:     Text Interpretation:        Radiology Dg Hip Unilat With Pelvis 2-3 Views Right  Result Date: 03/21/2017 CLINICAL DATA:  Right hip pain and stiffness. EXAM: DG HIP (WITH OR WITHOUT PELVIS) 2-3V RIGHT COMPARISON:  Pelvic x-ray dated September 08, 2007. FINDINGS: No acute fracture or malalignment. Mild bilateral hip  joint space narrowing with marginal osteophytes. The sacroiliac joints and pubic symphysis are intact. Bone mineralization is normal. Soft tissues are unremarkable. IMPRESSION: Mild bilateral hip osteoarthritis.  No acute osseous abnormality. Electronically Signed   By: Obie Dredge M.D.   On: 03/21/2017 14:20   Pertinent labs & imaging results that were available during my care of the patient were reviewed by me and considered in my medical decision making (see chart for details).  Medications Ordered in ED Medications - No data to display                                                                                                                                  Procedures Procedures  (including critical care time)  Medical Decision Making / ED Course I have reviewed the nursing notes for this encounter and the patient's prior records (if available in EHR or on provided paperwork).    Plain film of the right hip without acute injuries.  Ultrasound negative for DVT.  Recommended follow-up with PCP.  The patient is safe for discharge with strict return precautions.   Final Clinical Impression(s) / ED Diagnoses Final diagnoses:  Right leg pain    Disposition: Discharge  Condition: Good  I have discussed the results, Dx and Tx plan with the patient who expressed understanding and agree(s) with the plan. Discharge instructions discussed at great length. The patient was given strict return precautions who verbalized understanding of the instructions. No further questions at time of discharge.    ED Discharge Orders    None       Follow Up: Dorothyann Peng, MD [address] [place] [address] [phone]  Schedule an appointment as soon as possible for a visit  in 5-7 days, If symptoms do not improve or  worsen     This chart was dictated using voice recognition software.  Despite best efforts to proofread,  errors can occur which can change the  documentation meaning.   Nira Conn, MD 03/21/17 [phone]

## 2017-03-21 NOTE — ED Provider Notes (Signed)
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 PCP sent him here to r/o DVT. NO recent travel, recent orthopedic surgery, hx Ca.   ROS: Denies chest pain, shortness of breath, RLE swelling, fevers  Physical Exam:   Gen: No distress  Neuro: Awake and Alert  Skin: Warm    Focused Exam: Heart: RRR Resp: CTAB MSK: calfs with equal circumference, no notable LE swelling. R hip with mild TP, no decreased ROM. Ambulates w/o difficulty  Will order R hip XR as well as RLE doppler to r/o DVT. No clinical suspicion for PE.    Initiation of care has begun. The patient has been counseled on the process, plan, and necessity for staying for the completion/evaluation, and the remainder of the medical screening examination    Dub MikesDowless, Samantha Tripp, PA-C 03/21/17 1359    Pricilla LovelessGoldston, Scott, MD 03/21/17 2224

## 2017-03-21 NOTE — ED Triage Notes (Signed)
Per Pt, Pt is coming from PCP where he was being evaluated for leg pain. Pt reports that he has hx of blood clots. They recently changed his BP meds and he had his right leg swell. Pulses noted to be intact.

## 2017-04-30 ENCOUNTER — Ambulatory Visit: Payer: Medicare Other | Admitting: Nurse Practitioner

## 2017-08-04 ENCOUNTER — Ambulatory Visit: Payer: 59 | Admitting: Nurse Practitioner

## 2017-08-07 DIAGNOSIS — L400 Psoriasis vulgaris: Secondary | ICD-10-CM | POA: Diagnosis not present

## 2017-08-12 ENCOUNTER — Other Ambulatory Visit: Payer: Self-pay

## 2017-08-12 ENCOUNTER — Emergency Department (HOSPITAL_COMMUNITY): Payer: 59

## 2017-08-12 ENCOUNTER — Emergency Department (HOSPITAL_COMMUNITY)
Admission: EM | Admit: 2017-08-12 | Discharge: 2017-08-12 | Disposition: A | Discharge status: Home / Self Care | Payer: 59 | Attending: Emergency Medicine | Admitting: Emergency Medicine

## 2017-08-12 ENCOUNTER — Encounter (HOSPITAL_COMMUNITY): Payer: Self-pay | Admitting: Emergency Medicine

## 2017-08-12 DIAGNOSIS — R110 Nausea: Secondary | ICD-10-CM | POA: Diagnosis not present

## 2017-08-12 DIAGNOSIS — R1084 Generalized abdominal pain: Secondary | ICD-10-CM | POA: Diagnosis not present

## 2017-08-12 DIAGNOSIS — M545 Low back pain: Secondary | ICD-10-CM | POA: Diagnosis not present

## 2017-08-12 DIAGNOSIS — E119 Type 2 diabetes mellitus without complications: Secondary | ICD-10-CM | POA: Diagnosis not present

## 2017-08-12 DIAGNOSIS — R197 Diarrhea, unspecified: Secondary | ICD-10-CM | POA: Diagnosis not present

## 2017-08-12 DIAGNOSIS — F17290 Nicotine dependence, other tobacco product, uncomplicated: Secondary | ICD-10-CM | POA: Insufficient documentation

## 2017-08-12 DIAGNOSIS — Z9884 Bariatric surgery status: Secondary | ICD-10-CM | POA: Insufficient documentation

## 2017-08-12 DIAGNOSIS — I10 Essential (primary) hypertension: Secondary | ICD-10-CM | POA: Diagnosis not present

## 2017-08-12 DIAGNOSIS — Z79899 Other long term (current) drug therapy: Secondary | ICD-10-CM | POA: Insufficient documentation

## 2017-08-12 DIAGNOSIS — K5900 Constipation, unspecified: Secondary | ICD-10-CM | POA: Insufficient documentation

## 2017-08-12 DIAGNOSIS — R109 Unspecified abdominal pain: Secondary | ICD-10-CM

## 2017-08-12 LAB — URINALYSIS, ROUTINE W REFLEX MICROSCOPIC
Bilirubin Urine: NEGATIVE
Glucose, UA: NEGATIVE mg/dL
Hgb urine dipstick: NEGATIVE
Ketones, ur: NEGATIVE mg/dL
Leukocytes, UA: NEGATIVE
Nitrite: NEGATIVE
Protein, ur: NEGATIVE mg/dL
Specific Gravity, Urine: 1.032 — ABNORMAL HIGH (ref 1.005–1.030)
pH: 5 (ref 5.0–8.0)

## 2017-08-12 LAB — COMPREHENSIVE METABOLIC PANEL
ALT: 34 U/L (ref 0–44)
AST: 30 U/L (ref 15–41)
Albumin: 4 g/dL (ref 3.5–5.0)
Alkaline Phosphatase: 77 U/L (ref 38–126)
Anion gap: 6 (ref 5–15)
BUN: 16 mg/dL (ref 6–20)
CO2: 28 mmol/L (ref 22–32)
Calcium: 9.2 mg/dL (ref 8.9–10.3)
Chloride: 107 mmol/L (ref 98–111)
Creatinine, Ser: 1.29 mg/dL — ABNORMAL HIGH (ref 0.61–1.24)
GFR calc Af Amer: >60 mL/min (ref >60)
GFR calc non Af Amer: >60 mL/min (ref >60)
Glucose, Bld: 161 mg/dL — ABNORMAL HIGH (ref 70–99)
Potassium: 4.2 mmol/L (ref 3.5–5.1)
Sodium: 141 mmol/L (ref 135–145)
Total Bilirubin: 0.1 mg/dL — ABNORMAL LOW (ref 0.3–1.2)
Total Protein: 7.2 g/dL (ref 6.5–8.1)

## 2017-08-12 LAB — CBC
HCT: 43.8 % (ref 39.0–52.0)
Hemoglobin: 14.4 g/dL (ref 13.0–17.0)
MCH: 31.1 pg (ref 26.0–34.0)
MCHC: 32.9 g/dL (ref 30.0–36.0)
MCV: 94.6 fL (ref 78.0–100.0)
Platelets: 256 10*3/uL (ref 150–400)
RBC: 4.63 MIL/uL (ref 4.22–5.81)
RDW: 12.7 % (ref 11.5–15.5)
WBC: 5.4 10*3/uL (ref 4.0–10.5)

## 2017-08-12 LAB — LIPASE, BLOOD: Lipase: 33 U/L (ref 11–51)

## 2017-08-12 MED ORDER — SODIUM CHLORIDE 0.9 % IV BOLUS
500.0000 mL | Freq: Once | INTRAVENOUS | Status: AC
  Administered 2017-08-12: 500 mL via INTRAVENOUS

## 2017-08-12 MED ORDER — ONDANSETRON HCL 4 MG PO TABS
4.0000 mg | ORAL_TABLET | Freq: Once | ORAL | Status: DC
Start: 2017-08-12 — End: 2017-08-12

## 2017-08-12 MED ORDER — IOPAMIDOL (ISOVUE-300) INJECTION 61%
INTRAVENOUS | Status: AC
  Filled 2017-08-12: qty 100

## 2017-08-12 MED ORDER — POLYETHYLENE GLYCOL 3350 17 G PO PACK: 17.0000 g | PACK | Freq: Every day | ORAL | 0 refills | Status: DC

## 2017-08-12 MED ORDER — IOPAMIDOL (ISOVUE-300) INJECTION 61%
100.0000 mL | Freq: Once | INTRAVENOUS | Status: AC | PRN
  Administered 2017-08-12: 100 mL via INTRAVENOUS

## 2017-08-12 NOTE — ED Notes (Signed)
RX X 1 GIVEN 

## 2017-08-12 NOTE — ED Triage Notes (Addendum)
Patient here from home with complaints of abdominal pain and nausea for weeks. Constipation.

## 2017-08-12 NOTE — Discharge Instructions (Addendum)
You were given a prescription for miralax. Please take this daily until you resume having normal stools. Please look with your primary care doctor in 1 week for reevaluation.  You need to take your blood pressure medications when you arrive home. Return to the ER for any chest pain, shortness of breath, headaches, numbness, weakness or dizziness.  If you do not have a primary care provider, information for a healthcare clinic has been provided for you to make arrangements for follow up care. Please return to the ER sooner if you have any new or worsening symptoms, or if you have any of the following symptoms:  Abdominal pain that does not go away.  You have a fever.  You keep throwing up (vomiting).  The pain is felt only in portions of the abdomen. Pain in the right side could possibly be appendicitis. In an adult, pain in the left lower portion of the abdomen could be colitis or diverticulitis.  You pass bloody or black tarry stools.  There is bright red blood in the stool.  The constipation stays for more than 4 days.  There is belly (abdominal) or rectal pain.  You do not seem to be getting better.  You have any questions or concerns.

## 2017-08-12 NOTE — ED Provider Notes (Signed)
[facility]-EMERGENCY DEPT Provider Note   CSN: [id number] Arrival date & time: 08/12/17  1526     History   Chief Complaint Chief Complaint  Patient presents with  . Abdominal Pain  . Nausea    HPI Phillip Frye is a 47 y.o. male.  HPI   Pt is a 47 y/o male with a h/o GERD, HTN, obesity, OSA, PE (after surgery, not anticoagulated), laparoscopic gastric sleeve resection who presents to the ED today c/o generalized abdominal pain that began about 3-4 weeks ago. Pain is waxing and waning and episodes of pain last about 2-3 minutes at a time. He rates pain at a 7/10. Denies any exacerbating or alleviating factors. He also reports bilat low back pain, nausea and diarrhea for the last 3-4 weeks. He also reports tenesmus and feels that he has been constipated for the same time period.  States he had been having hard stools prior to the diarrhea and has taken miralax, fiber supplements, and prune juice without relief. States he has not had a normal bowel movement in 3 weeks. He reports a h/o constipation and has had similar abd pain in the past when he has been constipation.   No fevers. He denies chest pain, SOB, or vomiting. He denies dysuria, frequency, urgency. No numbness, weakness to the legs, no loss of control of bowels or bladder function.   Past Medical History:  Diagnosis Date  . Arthritis   . Diabetes mellitus without complication (HCC)   . Dysrhythmia    unknown type  . GERD (gastroesophageal reflux disease)   . Hypertension    MD stopped BP med 1 yr ago due to normal BP  . Obesity   . Pulmonary embolism, bilateral (HCC) 09/2012   on xarelto  . Shortness of breath    with exertion  . Sleep apnea    uses c pap     Patient Active Problem List   Diagnosis Date Noted  . 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 Abnormal EKG 12/23/2012  . Obesity, Class III, BMI 40-49.9 (morbid obesity) (HCC) 12/11/2012  . SOB (shortness of breath)  09/22/2012  . Pulmonary embolism, bilateral (HCC) 09/22/2012  . Diabetes mellitus without complication (HCC)   . DENTAL PAIN 03/31/2008  . CARPAL TUNNEL SYNDROME, BILATERAL 10/09/2007  . ULNAR NEUROPATHY 10/09/2007  . DENTAL CARIES 10/09/2007  . GERD 10/09/2007  . INGUINAL PAIN, BILATERAL 02/26/2007  . KNEE PAIN, RIGHT 12/19/2006  . FOOT PAIN, BILATERAL 12/19/2006  . HYPERTENSION 09/18/2006  . AVASCULAR NECROSIS 09/18/2006  . SLEEP APNEA 09/18/2006    Past Surgical History:  Procedure Laterality Date  . BREATH TEK H PYLORI N/A 12/28/2012   Procedure: BREATH TEK H PYLORI;  Franca: Atilano Ina, MD;  Location: [facility];  Service: General;  Laterality: N/A;  . HIP SURGERY Bilateral    to "shave gthe bones"  . LAPAROSCOPIC GASTRIC SLEEVE RESECTION N/A 05/31/2013   Procedure: LAPAROSCOPIC GASTRIC SLEEVE RESECTION;  Gazda: Atilano Ina, MD;  Location: [facility];  Service: General;  Laterality: N/A;        Home Medications    Prior to Admission medications   Medication Sig Start Date End Date Taking? Authorizing Provider  acetaminophen (TYLENOL) 500 MG tablet Take 1,000 mg by mouth every 6 (six) hours as needed for mild pain.   Yes [provider]  amLODipine (NORVASC) 5 MG tablet Take 1 tablet (5 mg total) by mouth daily. 02/12/17  Yes Dorena Bodo, NP  betamethasone dipropionate (DIPROLENE) 0.05 % cream Apply 1 application topically 2 (two) times daily. 08/07/17  Yes [provider]  cephALEXin (KEFLEX) 500 MG capsule Take 1 capsule (500 mg total) by mouth 4 (four) times daily. Patient not taking: Reported on 08/12/2017 02/12/17   Dorena Bodo, NP  polyethylene glycol [facility]) packet Take 17 g by mouth daily. 08/12/17   Airyana Sprunger S, PA-C  terbinafine (LAMISIL) 250 MG tablet Take 1 tablet (250 mg total) by mouth daily. Patient not taking: Reported on 08/12/2017 02/26/17   Lenn Sink, DPM  triamcinolone ointment (KENALOG) 0.1 % Apply 1 application  topically 2 (two) times daily. Patient not taking: Reported on 08/12/2017 02/12/17   Dorena Bodo, NP  metoprolol (LOPRESSOR) 50 MG tablet Take 50 mg by mouth 2 (two) times daily.    04/21/11  [provider]    Family History Family History  Problem Relation Age of Onset  . Cirrhosis Father   . Diabetes Other   . Diabetes Other   . Emphysema Paternal Grandfather        smoked    Social History Social History   Tobacco Use  . Smoking status: Current Every Day Smoker    Packs/day: 1.00    Years: 6.00    Pack years: 6.00    Types: Cigars  . Smokeless tobacco: Never Used  Substance Use Topics  . Alcohol use: Yes    Comment: occasional  . Drug use: No     Allergies   Patient has no known allergies.   Review of Systems Review of Systems  Constitutional: Negative for fever.  HENT: Negative for sore throat.   Eyes: Negative for visual disturbance.  Respiratory: Negative for shortness of breath.   Cardiovascular: Negative for chest pain and leg swelling.  Gastrointestinal: Positive for abdominal pain, constipation, diarrhea and nausea. Negative for blood in stool and vomiting.       Tenesmus  Genitourinary: Negative for dysuria, flank pain, frequency, hematuria and urgency.  Musculoskeletal: Positive for back pain.  Skin: Negative for rash.  Neurological: Negative for headaches.   Physical Exam Updated Vital Signs BP (!) 159/98 (BP Location: Left Arm)   Pulse 82   Temp 98.3 F (36.8 C) (Oral)   Resp 16   Ht 6\' 2"  (1.88 m)   Wt (!) 169.2 kg (373 lb)   SpO2 99%   BMI 47.89 kg/m   Physical Exam  Constitutional: He appears well-developed and well-nourished.  Non-toxic appearance. He does not appear ill. No distress.  HENT:  Head: Normocephalic and atraumatic.  Mouth/Throat: Oropharynx is clear and moist.  Eyes: Conjunctivae are normal. No scleral icterus.  Neck: Neck supple.  Cardiovascular: Normal rate, regular rhythm and normal heart sounds.  No  murmur heard. Pulmonary/Chest: Effort normal and breath sounds normal. No stridor. No respiratory distress. He has no wheezes. He has no rales.  Abdominal: Soft. Bowel sounds are normal. There is no tenderness. There is no rigidity, no rebound, no guarding, no CVA tenderness, no tenderness at McBurney's point and negative Murphy's sign.  Musculoskeletal: He exhibits no edema.  Neurological: He is alert.  Skin: Skin is warm and dry. Capillary refill takes less than 2 seconds.  Psychiatric: He has a normal mood and affect.  Nursing note and vitals reviewed.  ED Treatments / Results  Labs (all labs ordered are listed, but only abnormal results are displayed) Labs Reviewed  COMPREHENSIVE METABOLIC PANEL - Abnormal; Notable for the following components:  Result Value   Glucose, Bld 161 (*)    Creatinine, Ser 1.29 (*)    Total Bilirubin 0.1 (*)    All other components within normal limits  URINALYSIS, ROUTINE W REFLEX MICROSCOPIC - Abnormal; Notable for the following components:   Specific Gravity, Urine 1.032 (*)    All other components within normal limits  LIPASE, BLOOD  CBC    EKG None  Radiology Ct Abdomen Pelvis W Contrast  Result Date: 08/12/2017 CLINICAL DATA:  Abdominal pain and nausea.  Constipation. EXAM: CT ABDOMEN AND PELVIS WITH CONTRAST TECHNIQUE: Multidetector CT imaging of the abdomen and pelvis was performed using the standard protocol following bolus administration of intravenous contrast. CONTRAST:  ISOVUE-300 IOPAMIDOL (ISOVUE-300) INJECTION 61% COMPARISON:  Abdomen series August 12, 2017. CT abdomen and pelvis March 02, 2006 FINDINGS: Lower chest: Lung bases are clear. Hepatobiliary: No focal liver lesions are apparent. Gallbladder wall is not appreciably thickened. There is no biliary duct dilatation. Pancreas: No pancreatic mass or inflammatory focus evident. Spleen: No splenic lesions are appreciable. Adrenals/Urinary Tract: Adrenals bilaterally appear  unremarkable. Kidneys bilaterally show no evident mass or hydronephrosis on either side. There is no renal or ureteral calculus on either side. Urinary bladder is midline with wall thickness within normal limits. Stomach/Bowel: Patient has had previous gastric sleeve resection. There is no abnormal fluid or wall thickening in the area of previous surgery. Elsewhere, there is no appreciable bowel wall or mesenteric thickening. No evident bowel obstruction. No free air or portal venous air. Vascular/Lymphatic: No abdominal aortic aneurysm. No vascular lesions are evident on this study. There is no adenopathy appreciable in the abdomen or pelvis. Subcentimeter inguinal lymph nodes are considered nonspecific. Reproductive: Prostate and seminal vesicles are normal in size and contour. No evident pelvic mass. Other: There is no periappendiceal region inflammation. Appendix not appreciable. No abscess or adenopathy evident in the abdomen or pelvis. There is a small ventral hernia containing only fat. Musculoskeletal: There is postoperative change in each proximal femur. No blastic or lytic bone lesions are evident. There is degenerative change in the lower thoracic spine. No intramuscular lesions are evident. IMPRESSION: 1.Status post gastric sleeve resection without complicating features. No bowel obstruction. No abscess. No periappendiceal region inflammation. 2.  No renal or ureteral calculus.  No hydronephrosis. 3.  Postoperative change in each proximal femur. 4.  Small ventral hernia containing only fat. Electronically Signed   By: Bretta Bang III M.D.   On: 08/12/2017 17:50   Dg Abdomen Acute W/chest  Result Date: 08/12/2017 CLINICAL DATA:  Abdominal pain, nausea for weeks EXAM: DG ABDOMEN ACUTE W/ 1V CHEST COMPARISON:  None. FINDINGS: There is no evidence of dilated bowel loops or free intraperitoneal air. Moderate amount of stool throughout the colon. No radiopaque calculi or other significant radiographic  abnormality is seen. Heart size and mediastinal contours are within normal limits. Both lungs are clear. IMPRESSION: Negative abdominal radiographs.  No acute cardiopulmonary disease. Electronically Signed   By: Elige Ko   On: 08/12/2017 17:10    Procedures Procedures (including critical care time)  Medications Ordered in ED Medications  sodium chloride 0.9 % bolus 500 mL (0 mLs Intravenous Stopped 08/12/17 1832)  iopamidol (ISOVUE-300) 61 % injection 100 mL (100 mLs Intravenous Contrast Given 08/12/17 1705)     Initial Impression / Assessment and Plan / ED Course  I have reviewed the triage vital signs and the nursing notes.  Pertinent labs & imaging results that were available during my care  of the patient were reviewed by me and considered in my medical decision making (see chart for details).     Final Clinical Impressions(s) / ED Diagnoses   Final diagnoses:  Abdominal pain, unspecified abdominal location  Constipation, unspecified constipation type   Patient is nontoxic, nonseptic appearing, in no apparent distress.  Patient's pain and other symptoms adequately managed in emergency department.  Fluid bolus given.  Labs, imaging and vitals reviewed.  Patient does not meet the SIRS or Sepsis criteria.  On repeat exam patient does not have a surgical abdomin and there are no peritoneal signs.  No indication of appendicitis, bowel obstruction, bowel perforation, cholecystitis, diverticulitis, or other emergent intraabdominal pathology at this time. pts sxs seem related to constipation. Patient discharged home with symptomatic treatment and given strict instructions for follow-up with their primary care physician.  I have also discussed reasons to return immediately to the ER.  Patient expresses understanding and agrees with plan.  ED Discharge Orders        Ordered    polyethylene glycol [facility](MIRALAX) packet  Daily     08/12/17 1845       Rayne DuCouture, Madhavi Hamblen S, PA-C 08/12/17 2326      Benjiman CorePickering, Nathan, MD 08/13/17 [phone]

## 2017-08-23 ENCOUNTER — Ambulatory Visit (INDEPENDENT_AMBULATORY_CARE_PROVIDER_SITE_OTHER): Payer: Self-pay | Admitting: Family

## 2017-08-23 ENCOUNTER — Encounter: Payer: Self-pay | Admitting: Family

## 2017-08-23 VITALS — BP 140/80 | HR 91 | Temp 98.3°F | Resp 20 | Wt 377.4 lb

## 2017-08-23 DIAGNOSIS — J302 Other seasonal allergic rhinitis: Secondary | ICD-10-CM

## 2017-08-23 DIAGNOSIS — H109 Unspecified conjunctivitis: Secondary | ICD-10-CM

## 2017-08-23 MED ORDER — POLYMYXIN B-TRIMETHOPRIM 10000-0.1 UNIT/ML-% OP SOLN: 1.0000 [drp] | Freq: Four times a day (QID) | OPHTHALMIC | 0 refills | Status: DC

## 2017-08-23 MED ORDER — LEVOCETIRIZINE DIHYDROCHLORIDE 5 MG PO TABS: 5.0000 mg | ORAL_TABLET | Freq: Every evening | ORAL | 3 refills | Status: DC

## 2017-08-23 NOTE — Progress Notes (Signed)
Subjective:     Patient ID: Phillip Frye, male   DOB: 08-Oct-1970, 47 y.o.   MRN: [id number]  HPI 47 year old obese male with a history of allergic rhinitis is in today with c/o watery eyes, left eye redness and discomfort, and headache x 2 days. Does not take any medication for allergies. Works as a Financial risk analyst  Review of Systems  Constitutional: Negative.   HENT: Positive for congestion.   Eyes: Positive for discharge, redness and itching.  Respiratory: Negative.   Cardiovascular: Negative.   Musculoskeletal: Negative.   Allergic/Immunologic: Positive for environmental allergies.  Hematological: Negative.    Past Medical History:  Diagnosis Date  . Arthritis   . Diabetes mellitus without complication (HCC)   . Dysrhythmia    unknown type  . GERD (gastroesophageal reflux disease)   . Hypertension    MD stopped BP med 1 yr ago due to normal BP  . Obesity   . Pulmonary embolism, bilateral (HCC) 09/2012   on xarelto  . Shortness of breath    with exertion  . Sleep apnea    uses c pap     Social History   Socioeconomic History  . Marital status: Single    Spouse name: Not on file  . Number of children: Not on file  . Years of education: Not on file  . Highest education level: Not on file  Occupational History  . Not on file  Social Needs  . Financial resource strain: Not on file  . Food insecurity:    Worry: Not on file    Inability: Not on file  . Transportation needs:    Medical: Not on file    Non-medical: Not on file  Tobacco Use  . Smoking status: Current Every Day Smoker    Packs/day: 1.00    Years: 6.00    Pack years: 6.00    Types: Cigars  . Smokeless tobacco: Never Used  Substance and Sexual Activity  . Alcohol use: Yes    Comment: occasional  . Drug use: No  . Sexual activity: Not on file  Lifestyle  . Physical activity:    Days per week: Not on file    Minutes per session: Not on file  . Stress: Not on file  Relationships  . Social connections:     Talks on phone: Not on file    Gets together: Not on file    Attends religious service: Not on file    Active member of club or organization: Not on file    Attends meetings of clubs or organizations: Not on file    Relationship status: Not on file  . Intimate partner violence:    Fear of current or ex partner: Not on file    Emotionally abused: Not on file    Physically abused: Not on file    Forced sexual activity: Not on file  Other Topics Concern  . Not on file  Social History Narrative   Lives alone.  Works at [employer].    Past Surgical History:  Procedure Laterality Date  . BREATH TEK H PYLORI N/A 12/28/2012   Procedure: BREATH TEK H PYLORI;  Sainsbury: Atilano Ina, MD;  Location: [facility];  Service: General;  Laterality: N/A;  . HIP SURGERY Bilateral    to "shave gthe bones"  . LAPAROSCOPIC GASTRIC SLEEVE RESECTION N/A 05/31/2013   Procedure: LAPAROSCOPIC GASTRIC SLEEVE RESECTION;  Colello: Atilano Ina, MD;  Location: [facility];  Service: General;  Laterality: N/A;    Family History  Problem Relation Age of Onset  . Cirrhosis Father   . Diabetes Other   . Diabetes Other   . Emphysema Paternal Grandfather        smoked    No Known Allergies  Current Outpatient Medications on File Prior to Visit  Medication Sig Dispense Refill  . polyethylene glycol (MIRALAX) packet Take 17 g by mouth daily. 14 each 0  . acetaminophen (TYLENOL) 500 MG tablet Take 1,000 mg by mouth every 6 (six) hours as needed for mild pain.    Marland Kitchen. amLODipine (NORVASC) 5 MG tablet Take 1 tablet (5 mg total) by mouth daily. (Patient not taking: Reported on 08/23/2017) 30 tablet 2  . betamethasone dipropionate (DIPROLENE) 0.05 % cream Apply 1 application topically 2 (two) times daily.  1  . cephALEXin (KEFLEX) 500 MG capsule Take 1 capsule (500 mg total) by mouth 4 (four) times daily. (Patient not taking: Reported on 08/12/2017) 40 capsule 0  . terbinafine (LAMISIL) 250 MG tablet Take 1 tablet  (250 mg total) by mouth daily. (Patient not taking: Reported on 08/12/2017) 90 tablet 0  . triamcinolone ointment (KENALOG) 0.1 % Apply 1 application topically 2 (two) times daily. (Patient not taking: Reported on 08/12/2017) 30 g 1  . [DISCONTINUED] metoprolol (LOPRESSOR) 50 MG tablet Take 50 mg by mouth 2 (two) times daily.       No current facility-administered medications on file prior to visit.     BP 140/80 (BP Location: Right Arm, Patient Position: Sitting, Cuff Size: Large)   Pulse 91   Temp 98.3 F (36.8 C) (Oral)   Resp 20   Wt (!) 377 lb 6.4 oz (171.2 kg)   SpO2 94%   BMI 48.46 kg/m chart    Objective:   Physical Exam  Constitutional: He appears well-developed and well-nourished.  Eyes: Pupils are equal, round, and reactive to light. EOM are normal. Right eye exhibits no exudate. Left eye exhibits discharge and exudate. Right conjunctiva is not injected. Left conjunctiva is injected.  Cardiovascular: Normal rate and regular rhythm.  Pulmonary/Chest: Effort normal and breath sounds normal.  Musculoskeletal: Normal range of motion.  Neurological: He is alert.  Skin: Skin is warm and dry.   Past Medical History:  Diagnosis Date  . Arthritis   . Diabetes mellitus without complication (HCC)   . Dysrhythmia    unknown type  . GERD (gastroesophageal reflux disease)   . Hypertension    MD stopped BP med 1 yr ago due to normal BP  . Obesity   . Pulmonary embolism, bilateral (HCC) 09/2012   on xarelto  . Shortness of breath    with exertion  . Sleep apnea    uses c pap     Social History   Socioeconomic History  . Marital status: Single    Spouse name: Not on file  . Number of children: Not on file  . Years of education: Not on file  . Highest education level: Not on file  Occupational History  . Not on file  Social Needs  . Financial resource strain: Not on file  . Food insecurity:    Worry: Not on file    Inability: Not on file  . Transportation needs:     Medical: Not on file    Non-medical: Not on file  Tobacco Use  . Smoking status: Current Every Day Smoker    Packs/day: 1.00    Years: 6.00    Pack  years: 6.00    Types: Cigars  . Smokeless tobacco: Never Used  Substance and Sexual Activity  . Alcohol use: Yes    Comment: occasional  . Drug use: No  . Sexual activity: Not on file  Lifestyle  . Physical activity:    Days per week: Not on file    Minutes per session: Not on file  . Stress: Not on file  Relationships  . Social connections:    Talks on phone: Not on file    Gets together: Not on file    Attends religious service: Not on file    Active member of club or organization: Not on file    Attends meetings of clubs or organizations: Not on file    Relationship status: Not on file  . Intimate partner violence:    Fear of current or ex partner: Not on file    Emotionally abused: Not on file    Physically abused: Not on file    Forced sexual activity: Not on file  Other Topics Concern  . Not on file  Social History Narrative   Lives alone.  Works at [employer].    Past Surgical History:  Procedure Laterality Date  . BREATH TEK H PYLORI N/A 12/28/2012   Procedure: BREATH TEK H PYLORI;  Peed: Atilano Ina, MD;  Location: [facility];  Service: General;  Laterality: N/A;  . HIP SURGERY Bilateral    to "shave gthe bones"  . LAPAROSCOPIC GASTRIC SLEEVE RESECTION N/A 05/31/2013   Procedure: LAPAROSCOPIC GASTRIC SLEEVE RESECTION;  Duford: Atilano Ina, MD;  Location: [facility];  Service: General;  Laterality: N/A;    Family History  Problem Relation Age of Onset  . Cirrhosis Father   . Diabetes Other   . Diabetes Other   . Emphysema Paternal Grandfather        smoked    No Known Allergies  Current Outpatient Medications on File Prior to Visit  Medication Sig Dispense Refill  . polyethylene glycol (MIRALAX) packet Take 17 g by mouth daily. 14 each 0  . acetaminophen (TYLENOL) 500 MG tablet Take 1,000 mg by  mouth every 6 (six) hours as needed for mild pain.    Marland Kitchen amLODipine (NORVASC) 5 MG tablet Take 1 tablet (5 mg total) by mouth daily. (Patient not taking: Reported on 08/23/2017) 30 tablet 2  . betamethasone dipropionate (DIPROLENE) 0.05 % cream Apply 1 application topically 2 (two) times daily.  1  . cephALEXin (KEFLEX) 500 MG capsule Take 1 capsule (500 mg total) by mouth 4 (four) times daily. (Patient not taking: Reported on 08/12/2017) 40 capsule 0  . terbinafine (LAMISIL) 250 MG tablet Take 1 tablet (250 mg total) by mouth daily. (Patient not taking: Reported on 08/12/2017) 90 tablet 0  . triamcinolone ointment (KENALOG) 0.1 % Apply 1 application topically 2 (two) times daily. (Patient not taking: Reported on 08/12/2017) 30 g 1  . [DISCONTINUED] metoprolol (LOPRESSOR) 50 MG tablet Take 50 mg by mouth 2 (two) times daily.       No current facility-administered medications on file prior to visit.     BP 140/80 (BP Location: Right Arm, Patient Position: Sitting, Cuff Size: Large)   Pulse 91   Temp 98.3 F (36.8 C) (Oral)   Resp 20   Wt (!) 377 lb 6.4 oz (171.2 kg)   SpO2 94%   BMI 48.46 kg/m chart    Assessment:     Jermell was seen today for eye problem, headache  and dizziness.  Diagnoses and all orders for this visit:  Seasonal allergic rhinitis, unspecified trigger  Bacterial conjunctivitis of left eye  Other orders -     levocetirizine (XYZAL) 5 MG tablet; Take 1 tablet (5 mg total) by mouth every evening. -     trimethoprim-polymyxin b (POLYTRIM) ophthalmic solution; Place 1 drop into the left eye every 6 (six) hours.      Plan:     Call the office with any questions or concerns. Recheck as needed

## 2017-08-23 NOTE — Patient Instructions (Signed)

## 2017-08-25 ENCOUNTER — Ambulatory Visit: Payer: 59 | Admitting: Nurse Practitioner

## 2017-08-25 DIAGNOSIS — Z0289 Encounter for other administrative examinations: Secondary | ICD-10-CM

## 2017-09-10 ENCOUNTER — Emergency Department (HOSPITAL_COMMUNITY)
Admission: EM | Admit: 2017-09-10 | Discharge: 2017-09-10 | Discharge status: Absent without leave | Payer: Medicare Other

## 2017-09-10 ENCOUNTER — Encounter (HOSPITAL_COMMUNITY): Payer: Self-pay | Admitting: Student

## 2017-09-10 ENCOUNTER — Emergency Department (HOSPITAL_COMMUNITY): Payer: PRIVATE HEALTH INSURANCE

## 2017-09-10 ENCOUNTER — Emergency Department (HOSPITAL_COMMUNITY)
Admission: EM | Admit: 2017-09-10 | Discharge: 2017-09-10 | Disposition: A | Discharge status: Home / Self Care | Payer: PRIVATE HEALTH INSURANCE | Attending: Emergency Medicine | Admitting: Emergency Medicine

## 2017-09-10 DIAGNOSIS — M25521 Pain in right elbow: Secondary | ICD-10-CM | POA: Diagnosis not present

## 2017-09-10 DIAGNOSIS — E119 Type 2 diabetes mellitus without complications: Secondary | ICD-10-CM | POA: Insufficient documentation

## 2017-09-10 DIAGNOSIS — F17290 Nicotine dependence, other tobacco product, uncomplicated: Secondary | ICD-10-CM | POA: Insufficient documentation

## 2017-09-10 DIAGNOSIS — Z79899 Other long term (current) drug therapy: Secondary | ICD-10-CM | POA: Diagnosis not present

## 2017-09-10 DIAGNOSIS — W19XXXA Unspecified fall, initial encounter: Secondary | ICD-10-CM

## 2017-09-10 DIAGNOSIS — I10 Essential (primary) hypertension: Secondary | ICD-10-CM | POA: Insufficient documentation

## 2017-09-10 DIAGNOSIS — M79604 Pain in right leg: Secondary | ICD-10-CM | POA: Insufficient documentation

## 2017-09-10 MED ORDER — ACETAMINOPHEN 325 MG PO TABS
650.0000 mg | ORAL_TABLET | Freq: Once | ORAL | Status: AC
  Administered 2017-09-10: 650 mg via ORAL
  Filled 2017-09-10: qty 2

## 2017-09-10 NOTE — ED Provider Notes (Signed)
[facility] Provider Note   CSN: [id number] Arrival date & time: 09/10/17  0900     History   Chief Complaint No chief complaint on file.   HPI Phillip Frye is a 47 y.o. male with a hx of tobacco abuse, DM, GERD, HTN, obesity, OSA, PE (after surgery, not anticoagulated), and laparoscopic sleeve gastrectomy presents to the emergency department status post mechanical fall shortly prior to arrival complaining of right upper and and lower extremity pain.  Patient states that he was unloading ankles into a freezer, he states that he was walking down a ramp when he slipped on an area that was wet.  He states that he fell backwards and braced the fall with his right upper extremity.  No head injury or loss of consciousness.  Patient states he is having discomfort to the right elbow as well as to the right upper leg and knee, pain is most notable in the lower extremity specifically with ambulating.  No other specific alleviating or aggravating factors.  Pain is a 9 out of 10 in severity.  Patient has been ambulatory since the fall.  Denies prodrome of chest pain, dyspnea, lightheadedness, or dizziness.  Denies numbness, weakness, or tingling.  HPI  Past Medical History:  Diagnosis Date  . Arthritis   . Diabetes mellitus without complication (HCC)   . Dysrhythmia    unknown type  . GERD (gastroesophageal reflux disease)   . Hypertension    MD stopped BP med 1 yr ago due to normal BP  . Obesity   . Pulmonary embolism, bilateral (HCC) 09/2012   on xarelto  . Shortness of breath    with exertion  . Sleep apnea    uses c pap     Patient Active Problem List   Diagnosis Date Noted  . 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 Abnormal EKG 12/23/2012  . Obesity, Class III, BMI 40-49.9 (morbid obesity) (HCC) 12/11/2012  . SOB (shortness of breath) 09/22/2012  . Pulmonary embolism, bilateral (HCC) 09/22/2012  . Diabetes mellitus without  complication (HCC)   . DENTAL PAIN 03/31/2008  . CARPAL TUNNEL SYNDROME, BILATERAL 10/09/2007  . ULNAR NEUROPATHY 10/09/2007  . DENTAL CARIES 10/09/2007  . GERD 10/09/2007  . INGUINAL PAIN, BILATERAL 02/26/2007  . KNEE PAIN, RIGHT 12/19/2006  . FOOT PAIN, BILATERAL 12/19/2006  . HYPERTENSION 09/18/2006  . AVASCULAR NECROSIS 09/18/2006  . SLEEP APNEA 09/18/2006    Past Surgical History:  Procedure Laterality Date  . BREATH TEK H PYLORI N/A 12/28/2012   Procedure: BREATH TEK H PYLORI;  Faust: Atilano Ina, MD;  Location: [facility];  Service: General;  Laterality: N/A;  . HIP SURGERY Bilateral    to "shave gthe bones"  . LAPAROSCOPIC GASTRIC SLEEVE RESECTION N/A 05/31/2013   Procedure: LAPAROSCOPIC GASTRIC SLEEVE RESECTION;  Everman: Atilano Ina, MD;  Location: [facility];  Service: General;  Laterality: N/A;        Home Medications    Prior to Admission medications   Medication Sig Start Date End Date Taking? Authorizing Provider  acetaminophen (TYLENOL) 500 MG tablet Take 1,000 mg by mouth every 6 (six) hours as needed for mild pain.    [provider]  amLODipine (NORVASC) 5 MG tablet Take 1 tablet (5 mg total) by mouth daily. Patient not taking: Reported on 08/23/2017 02/12/17   Dorena Bodo, NP  betamethasone dipropionate (DIPROLENE) 0.05 % cream Apply 1 application topically 2 (two) times daily. 08/07/17   [provider]  cephALEXin (KEFLEX) 500 MG capsule Take 1 capsule (500 mg total) by mouth 4 (four) times daily. Patient not taking: Reported on 08/12/2017 02/12/17   Dorena BodoKennard, Lawrence, NP  levocetirizine (XYZAL) 5 MG tablet Take 1 tablet (5 mg total) by mouth every evening. 08/23/17   Worthy RancherWebb, Padonda B, FNP  polyethylene glycol [facility](MIRALAX) packet Take 17 g by mouth daily. 08/12/17   Couture, Cortni S, PA-C  terbinafine (LAMISIL) 250 MG tablet Take 1 tablet (250 mg total) by mouth daily. Patient not taking: Reported on 08/12/2017 02/26/17   Lenn Sinkegal, Norman S, DPM    triamcinolone ointment (KENALOG) 0.1 % Apply 1 application topically 2 (two) times daily. Patient not taking: Reported on 08/12/2017 02/12/17   Dorena BodoKennard, Lawrence, NP  trimethoprim-polymyxin b (POLYTRIM) ophthalmic solution Place 1 drop into the left eye every 6 (six) hours. 08/23/17   Eulis FosterWebb, Padonda B, FNP  metoprolol (LOPRESSOR) 50 MG tablet Take 50 mg by mouth 2 (two) times daily.    04/21/11  [provider]    Family History Family History  Problem Relation Age of Onset  . Cirrhosis Father   . Diabetes Other   . Diabetes Other   . Emphysema Paternal Grandfather        smoked    Social History Social History   Tobacco Use  . Smoking status: Current Every Day Smoker    Packs/day: 1.00    Years: 6.00    Pack years: 6.00    Types: Cigars  . Smokeless tobacco: Never Used  Substance Use Topics  . Alcohol use: Yes    Comment: occasional  . Drug use: No     Allergies   Patient has no known allergies.   Review of Systems Review of Systems  Constitutional: Negative for chills and fever.  Respiratory: Negative for shortness of breath.   Cardiovascular: Negative for chest pain.  Musculoskeletal: Positive for arthralgias and myalgias. Negative for back pain and neck pain.  Neurological: Negative for dizziness, weakness, light-headedness and numbness.     Physical Exam Updated Vital Signs There were no vitals taken for this visit.  Physical Exam  Constitutional: He appears well-developed and well-nourished.  Non-toxic appearance. No distress.  HENT:  Head: Normocephalic and atraumatic. Head is without raccoon's eyes and without Battle's sign.  Right Ear: No drainage. No hemotympanum.  Left Ear: No drainage. No hemotympanum.  Nose: Nose normal. No rhinorrhea.  Mouth/Throat: Uvula is midline.  Eyes: Pupils are equal, round, and reactive to light. Conjunctivae and EOM are normal. Right eye exhibits no discharge. Left eye exhibits no discharge.  Neck: Normal range of  motion. Neck supple. No spinous process tenderness present. No neck rigidity. No edema and no erythema present.  No palpable step off or crepitus  Cardiovascular: Normal rate and regular rhythm.  No murmur heard. Pulses:      Radial pulses are 2+ on the right side, and 2+ on the left side.       Posterior tibial pulses are 2+ on the right side, and 2+ on the left side.  Pulmonary/Chest: Breath sounds normal. No respiratory distress. He has no wheezes. He has no rales.  Abdominal: Soft. He exhibits no distension. There is no tenderness.  Musculoskeletal:  No obvious deformity, erythema, ecchymosis, or open wounds. Upper extremities: Patient has normal range of motion to bilateral shoulders, elbows, wrist, and all digits.  His upper extremities are nontender to palpation.  Specifically there is no tenderness to the medial/lateral epicondyles, radial head,  or the olecranon.  No snuffbox tenderness.  Neurovascularly intact distally. Back: No midline tenderness to palpation.  No step-off.  No crepitus. Lower extremities: Patient has normal range of motion to bilateral hips, knees, ankles.  He has some discomfort with palpation of the right anterior as well as the diffuse anterior knee without focal/point bony tenderness.  Neurovascularly intact distally.  Neurological: He is alert.  Clear speech.  Sensation grossly intact bilateral upper and lower extremities.  5 out of 5 symmetric grip strength.  5 out of 5 strength with hip flexion and extension as well as knee flexion and extension bilaterally.  Gait is intact.  Skin: Skin is warm and dry. No rash noted.  Psychiatric: He has a normal mood and affect. His behavior is normal.  Nursing note and vitals reviewed.    ED Treatments / Results  Labs (all labs ordered are listed, but only abnormal results are displayed) Labs Reviewed - No data to display  EKG None  Radiology Dg Knee Complete 4 Views Right  Result Date: 09/10/2017 CLINICAL DATA:   Right leg pain after a fall.  Initial encounter. EXAM: RIGHT KNEE - COMPLETE 4+ VIEW COMPARISON:  12/19/2006 FINDINGS: No acute fracture, dislocation, or knee joint effusion is identified. There is new mild medial compartment joint space narrowing. The soft tissues are unremarkable. IMPRESSION: 1. No acute bony abnormality. 2. Mild medial compartment osteoarthrosis. Electronically Signed   By: Sebastian Ache M.D.   On: 09/10/2017 10:04   Dg Femur Min 2 Views Right  Result Date: 09/10/2017 CLINICAL DATA:  Right leg pain after a fall.  Initial encounter. EXAM: RIGHT FEMUR 2 VIEWS COMPARISON:  Right hip radiographs 03/21/2017 FINDINGS: No acute fracture is identified. Mild right hip joint space narrowing and marginal osteophyte formation are again noted. No focal soft tissue abnormality is seen. IMPRESSION: No acute osseous abnormality identified. Mild right hip osteoarthrosis. Electronically Signed   By: Sebastian Ache M.D.   On: 09/10/2017 10:05    Procedures Procedures (including critical care time) Splint Application Date/Time: 09/10/2017 10:36 AM Performed by: EDT Authorized by: Cherly Anderson, PA-C   Consent:    Consent obtained:  Verbal   Consent given by:  Patient   Risks discussed:  Pain, numbness, discoloration and swelling   Alternatives discussed:  No treatment Pre-procedure details:    Sensation:  Normal Procedure details:    Laterality:  Right   Location:  Knee   Knee:  R knee   Splint type:  Knee immobilizer Post-procedure details:    Sensation:  Normal   Patient tolerance of procedure:  Tolerated well, no immediate complications Medications Ordered in ED Medications  acetaminophen (TYLENOL) tablet 650 mg (has no administration in time range)     Initial Impression / Assessment and Plan / ED Course  I have reviewed the triage vital signs and the nursing notes.  Pertinent labs & imaging results that were available during my care of the patient were reviewed by me  and considered in my medical decision making (see chart for details).   Patient presents to the emergency department status post mechanical fall with complaints of right upper and lower extremity pain.  Patient nontoxic-appearing, in no apparent distress, vitals WNL with the exception of elevated blood pressure, doubt HTN emergency, patient aware of need for recheck.  No evidence of serious head, neck, or back injury.  No point/focal vertebral tenderness or focal neurologic deficits to raise concern for spinal fracture/dislocation or head bleed.  Patient's  right upper extremity is completely nontender, NVI distally, do not feel that imaging is necessary in this area.  He is tender over the right femur and knee, x-rays obtained and negative for fracture dislocation, patient does have some osteoarthritis to hip/knee.  He is neurovascularly intact distally. Will place in knee immobilizer, PRICE, Tylenol (NSAIDs avoided due to hx of gastrectomy) PCP vs. Ortho follow up. I discussed results, treatment plan, need for follow-up, and return precautions with the patient. Provided opportunity for questions, patient confirmed understanding and is in agreement with plan.   Final Clinical Impressions(s) / ED Diagnoses   Final diagnoses:  Fall, initial encounter    ED Discharge Orders    None       Desmond Lope 09/10/17 1532    Wynetta Fines, MD 09/14/17 [phone]

## 2017-09-10 NOTE — ED Notes (Signed)
Knee immobilizer applied to rt knee. Pt educated. Pt verbalizes understanding.

## 2017-09-10 NOTE — Discharge Instructions (Addendum)
Please read and follow all provided instructions.  You have been seen today for injuries related to a fall at work.  Tests performed today include: An x-ray of the affected area - does NOT show any broken bones or dislocations-you do have some osteoarthritis in your hip and your knee. Vital signs. See below for your results today.   Home care instructions: -- *PRICE in the first 24-48 hours after injury: Protect (with brace, splint, sling), if given by your provider Rest Ice- Do not apply ice pack directly to your skin, place towel or similar between your skin and ice/ice pack. Apply ice for 20 min, then remove for 40 min while awake Compression- Wear brace, elastic bandage, splint as directed by your provider Elevate affected extremity above the level of your heart when not walking around for the first 24-48 hours   Please take Tylenol per over-the-counter dosing instructions for any continued discomfort.  Follow-up instructions: Please follow-up with your primary care provider or the provided orthopedic physician (bone specialist) if you continue to have significant pain in 1 week. In this case you may have a more severe injury that requires further care.   Return instructions:  Please return if your toes or feet are numb or tingling, appear gray or blue, or you have severe pain (also elevate the leg and loosen splint or wrap if you were given one) Please return to the Emergency Department if you experience worsening symptoms.  Please return if you have any other emergent concerns. Additional Information:  Your vital signs today were: BP (!) 153/109 (BP Location: Left Arm)    Pulse 87    Temp 98.4 F (36.9 C) (Oral)    Resp 18    SpO2 96%  If your blood pressure (BP) was elevated above 135/85 this visit, please have this repeated by your doctor within one month. ---------------

## 2017-09-10 NOTE — ED Notes (Signed)
My only role is to d/c pt. From computer. 

## 2017-09-10 NOTE — ED Triage Notes (Signed)
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 Reports right arm and right leg pain.

## 2017-09-10 NOTE — ED Notes (Signed)
Patient transported to X-ray 

## 2017-09-24 ENCOUNTER — Ambulatory Visit: Payer: 59 | Admitting: Nurse Practitioner

## 2017-11-30 ENCOUNTER — Emergency Department (HOSPITAL_COMMUNITY): Payer: 59

## 2017-11-30 ENCOUNTER — Ambulatory Visit (INDEPENDENT_AMBULATORY_CARE_PROVIDER_SITE_OTHER)
Admission: EM | Admit: 2017-11-30 | Discharge: 2017-11-30 | Disposition: A | Discharge status: Home / Self Care | Payer: 59 | Source: Home / Self Care | Attending: Internal Medicine | Admitting: Internal Medicine

## 2017-11-30 ENCOUNTER — Encounter (HOSPITAL_COMMUNITY): Payer: Self-pay

## 2017-11-30 ENCOUNTER — Emergency Department (HOSPITAL_COMMUNITY)
Admission: EM | Admit: 2017-11-30 | Discharge: 2017-11-30 | Disposition: A | Discharge status: Home / Self Care | Payer: 59 | Attending: Emergency Medicine | Admitting: Emergency Medicine

## 2017-11-30 ENCOUNTER — Ambulatory Visit (INDEPENDENT_AMBULATORY_CARE_PROVIDER_SITE_OTHER): Payer: Self-pay | Admitting: Family Medicine

## 2017-11-30 ENCOUNTER — Encounter (HOSPITAL_COMMUNITY): Payer: Self-pay | Admitting: Emergency Medicine

## 2017-11-30 ENCOUNTER — Other Ambulatory Visit: Payer: Self-pay

## 2017-11-30 VITALS — BP 130/80 | HR 103 | Temp 98.5°F | Resp 17 | Wt 366.4 lb

## 2017-11-30 DIAGNOSIS — R05 Cough: Secondary | ICD-10-CM | POA: Diagnosis not present

## 2017-11-30 DIAGNOSIS — R350 Frequency of micturition: Secondary | ICD-10-CM

## 2017-11-30 DIAGNOSIS — F17290 Nicotine dependence, other tobacco product, uncomplicated: Secondary | ICD-10-CM | POA: Insufficient documentation

## 2017-11-30 DIAGNOSIS — R739 Hyperglycemia, unspecified: Secondary | ICD-10-CM

## 2017-11-30 DIAGNOSIS — Z86718 Personal history of other venous thrombosis and embolism: Secondary | ICD-10-CM | POA: Diagnosis not present

## 2017-11-30 DIAGNOSIS — E1165 Type 2 diabetes mellitus with hyperglycemia: Secondary | ICD-10-CM | POA: Diagnosis not present

## 2017-11-30 DIAGNOSIS — I10 Essential (primary) hypertension: Secondary | ICD-10-CM | POA: Diagnosis not present

## 2017-11-30 DIAGNOSIS — E119 Type 2 diabetes mellitus without complications: Secondary | ICD-10-CM

## 2017-11-30 DIAGNOSIS — Z79899 Other long term (current) drug therapy: Secondary | ICD-10-CM | POA: Diagnosis not present

## 2017-11-30 DIAGNOSIS — R81 Glycosuria: Secondary | ICD-10-CM

## 2017-11-30 LAB — POCT URINALYSIS DIPSTICK
Bilirubin, UA: NEGATIVE
Blood, UA: NEGATIVE
Glucose, UA: POSITIVE — AB
Ketones, UA: NEGATIVE
Leukocytes, UA: NEGATIVE
Nitrite, UA: NEGATIVE
Protein, UA: NEGATIVE
Spec Grav, UA: 1.01 (ref 1.010–1.025)
Urobilinogen, UA: 0.2 E.U./dL
pH, UA: 5.5 (ref 5.0–8.0)

## 2017-11-30 LAB — URINALYSIS, ROUTINE W REFLEX MICROSCOPIC
Bacteria, UA: NONE SEEN
Bilirubin Urine: NEGATIVE
Glucose, UA: >=500 mg/dL — AB
Hgb urine dipstick: NEGATIVE
Ketones, ur: NEGATIVE mg/dL
Leukocytes, UA: NEGATIVE
Nitrite: NEGATIVE
Protein, ur: NEGATIVE mg/dL
Specific Gravity, Urine: 1.027 (ref 1.005–1.030)
pH: 6 (ref 5.0–8.0)

## 2017-11-30 LAB — POCT I-STAT, CHEM 8
BUN: 17 mg/dL (ref 6–20)
Calcium, Ion: 1.19 mmol/L (ref 1.15–1.40)
Chloride: 88 mmol/L — ABNORMAL LOW (ref 98–111)
Creatinine, Ser: 1.3 mg/dL — ABNORMAL HIGH (ref 0.61–1.24)
Glucose, Bld: >700 mg/dL (ref 70–99)
HCT: 49 % (ref 39.0–52.0)
Hemoglobin: 16.7 g/dL (ref 13.0–17.0)
Potassium: 5.2 mmol/L — ABNORMAL HIGH (ref 3.5–5.1)
Sodium: 128 mmol/L — ABNORMAL LOW (ref 135–145)
TCO2: 29 mmol/L (ref 22–32)

## 2017-11-30 LAB — BASIC METABOLIC PANEL
Anion gap: 11 (ref 5–15)
BUN: 14 mg/dL (ref 6–20)
CO2: 27 mmol/L (ref 22–32)
Calcium: 9.6 mg/dL (ref 8.9–10.3)
Chloride: 89 mmol/L — ABNORMAL LOW (ref 98–111)
Creatinine, Ser: 1.37 mg/dL — ABNORMAL HIGH (ref 0.61–1.24)
GFR calc Af Amer: >60 mL/min (ref >60)
GFR calc non Af Amer: 59 mL/min — ABNORMAL LOW (ref >60)
Glucose, Bld: 674 mg/dL (ref 70–99)
Potassium: 4.9 mmol/L (ref 3.5–5.1)
Sodium: 127 mmol/L — ABNORMAL LOW (ref 135–145)

## 2017-11-30 LAB — CBC
HCT: 43 % (ref 39.0–52.0)
Hemoglobin: 14.5 g/dL (ref 13.0–17.0)
MCH: 30 pg (ref 26.0–34.0)
MCHC: 33.7 g/dL (ref 30.0–36.0)
MCV: 89 fL (ref 80.0–100.0)
Platelets: 262 10*3/uL (ref 150–400)
RBC: 4.83 MIL/uL (ref 4.22–5.81)
RDW: 11.2 % — ABNORMAL LOW (ref 11.5–15.5)
WBC: 6.3 10*3/uL (ref 4.0–10.5)
nRBC: 0 % (ref 0.0–0.2)

## 2017-11-30 LAB — CBG MONITORING, ED
Glucose-Capillary: >600 mg/dL (ref 70–99)
Glucose-Capillary: 486 mg/dL — ABNORMAL HIGH (ref 70–99)
Glucose-Capillary: 382 mg/dL — ABNORMAL HIGH (ref 70–99)
Glucose-Capillary: 319 mg/dL — ABNORMAL HIGH (ref 70–99)

## 2017-11-30 MED ORDER — INSULIN REGULAR(HUMAN) IN NACL 100-0.9 UT/100ML-% IV SOLN
INTRAVENOUS | Status: DC
  Administered 2017-11-30: 4.3 [IU] via INTRAVENOUS
  Filled 2017-11-30: qty 100

## 2017-11-30 MED ORDER — SODIUM CHLORIDE 0.9 % IV BOLUS
1000.0000 mL | Freq: Once | INTRAVENOUS | Status: AC
  Administered 2017-11-30: 1000 mL via INTRAVENOUS

## 2017-11-30 MED ORDER — DEXTROSE-NACL 5-0.45 % IV SOLN: INTRAVENOUS | Status: DC

## 2017-11-30 MED ORDER — METFORMIN HCL 500 MG PO TABS: 500.0000 mg | ORAL_TABLET | Freq: Two times a day (BID) | ORAL | 0 refills | Status: DC

## 2017-11-30 MED ORDER — SODIUM CHLORIDE 0.9 % IV BOLUS
1000.0000 mL | Freq: Once | INTRAVENOUS | Status: AC
Start: 2017-11-30 — End: 2017-11-30
  Administered 2017-11-30: 1000 mL via INTRAVENOUS

## 2017-11-30 MED ORDER — INSULIN ASPART 100 UNIT/ML ~~LOC~~ SOLN
3.0000 [IU] | Freq: Once | SUBCUTANEOUS | Status: AC
  Administered 2017-11-30: 3 [IU] via SUBCUTANEOUS
  Filled 2017-11-30: qty 1

## 2017-11-30 MED ORDER — SODIUM CHLORIDE 0.9 % IV SOLN
INTRAVENOUS | Status: DC
  Administered 2017-11-30: 18:00:00 via INTRAVENOUS

## 2017-11-30 MED ORDER — INSULIN ASPART 100 UNIT/ML ~~LOC~~ SOLN: 6.0000 [IU] | Freq: Once | SUBCUTANEOUS | Status: DC

## 2017-11-30 MED ORDER — INSULIN ASPART 100 UNIT/ML ~~LOC~~ SOLN
10.0000 [IU] | Freq: Once | SUBCUTANEOUS | Status: AC
  Administered 2017-11-30: 10 [IU] via SUBCUTANEOUS
  Filled 2017-11-30: qty 1

## 2017-11-30 NOTE — Discharge Instructions (Signed)
Your blood sugar was higher than our machine can read Please go to the emergency room for further treatment and evaluation

## 2017-11-30 NOTE — Discharge Instructions (Signed)
As we discussed, you need to arrange for a primary care doctor.  I provided you the [facility].  Please call their office to arrange for an appointment for primary care establishment.  Take Metformin as directed.  Return to emergency department for any abdominal pain, nausea/vomiting, fever or any other worsening or concerning symptoms.

## 2017-11-30 NOTE — ED Provider Notes (Signed)
Care assumed from  Fayrene Helper, PA-C at shift change with CBG pending.   In brief, this patient is a 47 y.o. M who presents for evaluation of hyperglycemia.  Patient reports that he has been diagnosed with prediabetes PVC.  He went to urgent care earlier today for not feeling well.  At that time, his blood sugar was high and he was sent to the ED for further evaluation.  He see note from previous provider for full history/physical exam.  PLAN: Patient with a normal bicarb and anion gap.  No evidence of ketones in urine.  Do not suspect DKA at this time.  He is getting fluids, insulin drip and has gotten subcutaneous insulin.  We will plan to recheck his CBG for goal of less than 300.  Patient does not wish to be admitted at this time.  If blood sugar continues to decrease, we will plan to send him home with metformin 500 mg twice daily and arrange for outpatient follow-up.  MDM:  Repeat CBG is 486.    Patient still on insulin drip.  Repeat CBG is 382.  Will give subcutaneous insulin.  Repeat CBG is 319.  Discussed results with patient.  Patient states that he does not want to be admitted.  Given improvement in his blood sugar, lack of evidence of DKA, I feel that this is reasonable.  We will start patient on metformin 500 mg twice daily.  Instructed patient that he will need to follow-up with [facility] or another facility to establish primary care doctor. Patient had ample opportunity for questions and discussion. All patient's questions were answered with full understanding. Strict return precautions discussed. Patient expresses understanding and agreement to plan.    1. Type 2 diabetes mellitus without complication, unspecified whether long term insulin use (HCC)        Maxwell Caul, PA-C 11/30/17 1851    Melene Plan, DO 11/30/17 1913

## 2017-11-30 NOTE — ED Notes (Signed)
Patient verbalizes understanding of discharge instructions. Opportunity for questioning and answers were provided. Armband removed by staff, pt discharged from ED.  

## 2017-11-30 NOTE — ED Notes (Signed)
Patient transported to X-ray 

## 2017-11-30 NOTE — ED Notes (Signed)
Pt's SpO2 decreased to 84 while he was sleeping.  Pt states he does have sleep apnea.  Applied nasal cannula 2L/min and O2 increased to 96.

## 2017-11-30 NOTE — ED Triage Notes (Signed)
Pt c/o urinary frequency. Pt states he's not taking his meds.

## 2017-11-30 NOTE — ED Triage Notes (Signed)
Pt reports polyuria and polydipsia for a while. States he went to urgent care and was sent here. Pt a/ox4, resp e/u, nad. Pt states he was told he is diabetic but is not taking any meds. cbg "high" in triage

## 2017-11-30 NOTE — Progress Notes (Signed)
Phillip Frye is a 47 y.o. male who presents today with concerns of increased urination for the past two weeks, he reports increased urination, increased thirst and today increased sweating and a feeling of being unwell. Record chart review reveled history of diabetes without complications but patient denies knowledge of diagnosis. He denies fever but reports increased dizziness and headaches over the past few days.  Review of Systems  Constitutional: Negative for chills, fever and malaise/fatigue.  HENT: Negative for congestion, ear discharge, ear pain, sinus pain and sore throat.   Eyes: Negative.   Respiratory: Negative for cough, sputum production and shortness of breath.   Cardiovascular: Negative.  Negative for chest pain.  Gastrointestinal: Negative for abdominal pain, diarrhea, nausea and vomiting.  Genitourinary: Positive for frequency. Negative for dysuria, hematuria and urgency.  Musculoskeletal: Negative for myalgias.  Skin: Negative.   Neurological: Positive for dizziness and headaches.  Endo/Heme/Allergies: Negative.   Psychiatric/Behavioral: Negative.     O: Vitals:   11/30/17 1121  BP: 130/80  Pulse: (!) 103  Resp: 17  Temp: 98.5 F (36.9 C)  SpO2: 96%     Physical Exam   A: 1. Frequent urination   2. Glucosuria      P: Exam findings, diagnosis etiology and medication use and indications reviewed with patient. Follow- Up and discharge instructions provided. No emergent/urgent issues found on exam.  Patient verbalized understanding of information provided and agrees with plan of care (POC), all questions answered.  1. Frequent urination - POCT urinalysis dipstick Results for orders placed or performed in visit on 11/30/17 (from the past 24 hour(s))  POCT urinalysis dipstick     Status: Abnormal   Collection Time: 11/30/17 11:27 AM  Result Value Ref Range   Color, UA yellow    Clarity, UA clear    Glucose, UA Positive (A) Negative   Bilirubin, UA  negative    Ketones, UA negative    Spec Grav, UA 1.010 1.010 - 1.025   Blood, UA negative    pH, UA 5.5 5.0 - 8.0   Protein, UA Negative Negative   Urobilinogen, UA 0.2 0.2 or 1.0 E.U./dL   Nitrite, UA negative    Leukocytes, UA Negative Negative   Appearance     Odor       2. Glucosuria 2000+ glucose in  Urine. Patient advised to seek comprehensive evaluation in higher level of care due to the need of additional lab work for diagnosis and treatment. Discussed urgent care or emergency department for evaluation.

## 2017-11-30 NOTE — ED Provider Notes (Signed)
[facility]    CSN: [id number] Arrival date & time: 11/30/17  1146     History   Chief Complaint Chief Complaint  Patient presents with  . Urinary Frequency    HPI Phillip Frye is a 47 y.o. male history of hypertension, DM type II, previous PE, sleep apnea presenting today for evaluation of urinary frequency.  Patient was seen at [facility] earlier today for polyuria and polydipsia, sent here because UA showing significant amount of glucose.  Patient states that he has had occasional dizziness and some nausea.  Denies vomiting.  He has tried been trying to push fluids as he has been urinating a lot.  He has felt dehydrated.  He is not currently on medicine for diabetes.  States that he has not been on medicine for years.  States he previously used a pen, but is unsure what medicine he was on.  Does not have a primary care.  HPI  Past Medical History:  Diagnosis Date  . Arthritis   . Diabetes mellitus without complication (HCC)   . Dysrhythmia    unknown type  . GERD (gastroesophageal reflux disease)   . Hypertension    MD stopped BP med 1 yr ago due to normal BP  . Obesity   . Pulmonary embolism, bilateral (HCC) 09/2012   on xarelto  . Shortness of breath    with exertion  . Sleep apnea    uses c pap     Patient Active Problem List   Diagnosis Date Noted  . 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 Abnormal EKG 12/23/2012  . Obesity, Class III, BMI 40-49.9 (morbid obesity) (HCC) 12/11/2012  . SOB (shortness of breath) 09/22/2012  . Pulmonary embolism, bilateral (HCC) 09/22/2012  . Diabetes mellitus without complication (HCC)   . DENTAL PAIN 03/31/2008  . CARPAL TUNNEL SYNDROME, BILATERAL 10/09/2007  . ULNAR NEUROPATHY 10/09/2007  . DENTAL CARIES 10/09/2007  . GERD 10/09/2007  . INGUINAL PAIN, BILATERAL 02/26/2007  . KNEE PAIN, RIGHT 12/19/2006  . FOOT PAIN, BILATERAL 12/19/2006  . HYPERTENSION 09/18/2006  . AVASCULAR NECROSIS 09/18/2006  .  SLEEP APNEA 09/18/2006    Past Surgical History:  Procedure Laterality Date  . BREATH TEK H PYLORI N/A 12/28/2012   Procedure: BREATH TEK H PYLORI;  Brucato: Atilano Ina, MD;  Location: [facility];  Service: General;  Laterality: N/A;  . HIP SURGERY Bilateral    to "shave gthe bones"  . LAPAROSCOPIC GASTRIC SLEEVE RESECTION N/A 05/31/2013   Procedure: LAPAROSCOPIC GASTRIC SLEEVE RESECTION;  Allaire: Atilano Ina, MD;  Location: [facility];  Service: General;  Laterality: N/A;       Home Medications    Prior to Admission medications   Medication Sig Start Date End Date Taking? Authorizing Provider  acetaminophen (TYLENOL) 500 MG tablet Take 1,000 mg by mouth every 6 (six) hours as needed for mild pain.    [provider]  levocetirizine (XYZAL) 5 MG tablet Take 1 tablet (5 mg total) by mouth every evening. Patient not taking: Reported on 11/30/2017 08/23/17   Eulis Foster, FNP  metoprolol (LOPRESSOR) 50 MG tablet Take 50 mg by mouth 2 (two) times daily.    04/21/11  [provider]    Family History Family History  Problem Relation Age of Onset  . Cirrhosis Father   . Diabetes Other   . Diabetes Other   . Emphysema Paternal Grandfather        smoked    Social  History Social History   Tobacco Use  . Smoking status: Current Every Day Smoker    Packs/day: 1.00    Years: 6.00    Pack years: 6.00    Types: Cigars  . Smokeless tobacco: Never Used  Substance Use Topics  . Alcohol use: Yes    Comment: occasional  . Drug use: No     Allergies   Patient has no known allergies.   Review of Systems Review of Systems  Constitutional: Negative for fatigue and fever.  HENT: Negative for congestion, sinus pressure and sore throat.   Eyes: Negative for photophobia, pain and visual disturbance.  Respiratory: Negative for cough and shortness of breath.   Cardiovascular: Negative for chest pain.  Gastrointestinal: Positive for nausea. Negative for  abdominal pain and vomiting.  Endocrine: Positive for polydipsia and polyuria.  Genitourinary: Negative for decreased urine volume and hematuria.  Musculoskeletal: Negative for myalgias, neck pain and neck stiffness.  Neurological: Positive for dizziness. Negative for syncope, facial asymmetry, speech difficulty, weakness, light-headedness, numbness and headaches.     Physical Exam Triage Vital Signs ED Triage Vitals  Enc Vitals Group     BP 11/30/17 1232 (!) 159/94     Pulse Rate 11/30/17 1232 (!) 102     Resp 11/30/17 1232 18     Temp 11/30/17 1232 98.2 F (36.8 C)     Temp src --      SpO2 11/30/17 1232 100 %     Weight 11/30/17 1226 (!) 366 lb (166 kg)     Height --      Head Circumference --      Peak Flow --      Pain Score 11/30/17 1254 0     Pain Loc --      Pain Edu? --      Excl. in GC? --    No data found.  Updated Vital Signs BP (!) 159/94 (BP Location: Left Arm)   Pulse (!) 102   Temp 98.2 F (36.8 C)   Resp 18   Wt (!) 366 lb (166 kg)   SpO2 100%   BMI 46.99 kg/m   Visual Acuity Right Eye Distance:   Left Eye Distance:   Bilateral Distance:    Right Eye Near:   Left Eye Near:    Bilateral Near:     Physical Exam  Constitutional: He is oriented to person, place, and time. He appears well-developed and well-nourished.  HENT:  Head: Normocephalic and atraumatic.  Mouth/Throat: Oropharynx is clear and moist.  Eyes: Conjunctivae are normal.  Neck: Neck supple.  Cardiovascular: Normal rate and regular rhythm.  No murmur heard. Pulmonary/Chest: Effort normal and breath sounds normal. No respiratory distress.  Abdominal: Soft. There is tenderness.  Mild tenderness throughout abdomen, no focal tenderness  Musculoskeletal: He exhibits no edema.  Neurological: He is alert and oriented to person, place, and time.  Skin: Skin is warm and dry.  Psychiatric: He has a normal mood and affect.  Nursing note and vitals reviewed.    UC Treatments /  Results  Labs (all labs ordered are listed, but only abnormal results are displayed) Labs Reviewed  POCT I-STAT, CHEM 8 - Abnormal; Notable for the following components:      Result Value   Sodium 128 (*)    Potassium 5.2 (*)    Chloride 88 (*)    Creatinine, Ser 1.30 (*)    Glucose, Bld >700 (*)    All other components within normal  limits    EKG None  Radiology No results found.  Procedures Procedures (including critical care time)  Medications Ordered in UC Medications - No data to display  Initial Impression / Assessment and Plan / UC Course  I have reviewed the triage vital signs and the nursing notes.  Pertinent labs & imaging results that were available during my care of the patient were reviewed by me and considered in my medical decision making (see chart for details).     Glucose greater than 700, machine cannot read higher.  Given symptoms of blood sugar will recommend patient to go to emergency room for further evaluation and.  Recommended patient to go to [facility] as this is the closest, he is requesting to go to [facility], advised him that he should not delay his treatment and go to [facility].  Likely needs fluids, insulin.Discussed strict return precautions. Patient verbalized understanding and is agreeable with plan.  Final Clinical Impressions(s) / UC Diagnoses   Final diagnoses:  Hyperglycemia  Urinary frequency     Discharge Instructions     Your blood sugar was higher than our machine can read Please go to the emergency room for further treatment and evaluation   ED Prescriptions    None     Controlled Substance Prescriptions [place] Controlled Substance Registry consulted? Not Applicable   Lew Dawes, [place] 11/30/17 1257

## 2017-11-30 NOTE — ED Provider Notes (Addendum)
[facility] EMERGENCY DEPARTMENT Provider Note   CSN: [id number] Arrival date & time: 11/30/17  1310     History   Chief Complaint Chief Complaint  Patient presents with  . Hyperglycemia    HPI Phillip Frye is a 47 y.o. male.  The history is provided by the patient and medical records. No language interpreter was used.  Hyperglycemia     47 year old male with remote history of diabetes, arthritis, GERD, obesity, prior PE currently on Xarelto sent here from urgent care center with concerns for hyperglycemia.  Patient report for the past week he has been feeling well.  He does endorse polyuria, polydipsia, and mild generalized abdominal discomfort.  For the past 3 days he has had a nonproductive cough.  He endorsed mild headache and dizziness.  He mentioned that each morning his vision is blurry but throughout the day it did improved.  This is not new.  He went to urgent care today and was noted to have hyperglycemia and was sent to the ED for further evaluation.  Patient otherwise denies having fever, URI symptoms, nausea, vomiting, diarrhea, dysuria or rash.  He does drink on occasion, and smokes cigar.  He does not have a PCP and currently not on any medication.  He did report remote history of PE but not on anticoagulant currently.    Past Medical History:  Diagnosis Date  . Arthritis   . Diabetes mellitus without complication (HCC)   . Dysrhythmia    unknown type  . GERD (gastroesophageal reflux disease)   . Hypertension    MD stopped BP med 1 yr ago due to normal BP  . Obesity   . Pulmonary embolism, bilateral (HCC) 09/2012   on xarelto  . Shortness of breath    with exertion  . Sleep apnea    uses c pap     Patient Active Problem List   Diagnosis Date Noted  . 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 Abnormal EKG 12/23/2012  . Obesity, Class III, BMI 40-49.9 (morbid obesity) (HCC) 12/11/2012  . SOB (shortness of breath) 09/22/2012    . Pulmonary embolism, bilateral (HCC) 09/22/2012  . Diabetes mellitus without complication (HCC)   . DENTAL PAIN 03/31/2008  . CARPAL TUNNEL SYNDROME, BILATERAL 10/09/2007  . ULNAR NEUROPATHY 10/09/2007  . DENTAL CARIES 10/09/2007  . GERD 10/09/2007  . INGUINAL PAIN, BILATERAL 02/26/2007  . KNEE PAIN, RIGHT 12/19/2006  . FOOT PAIN, BILATERAL 12/19/2006  . HYPERTENSION 09/18/2006  . AVASCULAR NECROSIS 09/18/2006  . SLEEP APNEA 09/18/2006    Past Surgical History:  Procedure Laterality Date  . BREATH TEK H PYLORI N/A 12/28/2012   Procedure: BREATH TEK H PYLORI;  Gordon: Atilano Ina, MD;  Location: [facility];  Service: General;  Laterality: N/A;  . HIP SURGERY Bilateral    to "shave gthe bones"  . LAPAROSCOPIC GASTRIC SLEEVE RESECTION N/A 05/31/2013   Procedure: LAPAROSCOPIC GASTRIC SLEEVE RESECTION;  Hertzog: Atilano Ina, MD;  Location: [facility];  Service: General;  Laterality: N/A;        Home Medications    Prior to Admission medications   Medication Sig Start Date End Date Taking? Authorizing Provider  acetaminophen (TYLENOL) 500 MG tablet Take 1,000 mg by mouth every 6 (six) hours as needed for mild pain.    [provider]  levocetirizine (XYZAL) 5 MG tablet Take 1 tablet (5 mg total) by mouth every evening. Patient not taking: Reported on 11/30/2017 08/23/17  Worthy Rancher B, FNP  metoprolol (LOPRESSOR) 50 MG tablet Take 50 mg by mouth 2 (two) times daily.    04/21/11  [provider]    Family History Family History  Problem Relation Age of Onset  . Cirrhosis Father   . Diabetes Other   . Diabetes Other   . Emphysema Paternal Grandfather        smoked    Social History Social History   Tobacco Use  . Smoking status: Current Every Day Smoker    Packs/day: 1.00    Years: 6.00    Pack years: 6.00    Types: Cigars  . Smokeless tobacco: Never Used  Substance Use Topics  . Alcohol use: Yes    Comment: occasional  . Drug use: No      Allergies   Patient has no known allergies.   Review of Systems Review of Systems  All other systems reviewed and are negative.    Physical Exam Updated Vital Signs BP (!) 152/99   Pulse 95   Resp 15   SpO2 99%   Physical Exam  Constitutional: He is oriented to person, place, and time. He appears well-developed and well-nourished. No distress.  Obese male in bed in no acute discomfort, nontoxic in appearance  HENT:  Head: Atraumatic.  Mouth/Throat: Oropharynx is clear and moist.  Eyes: Pupils are equal, round, and reactive to light. Conjunctivae and EOM are normal.  Neck: Normal range of motion. Neck supple.  Cardiovascular: Normal rate and regular rhythm.  Pulmonary/Chest: Effort normal. He has rales (Faint crackles heard at the lung bases).  Abdominal: Soft. Bowel sounds are normal. He exhibits no distension. There is no tenderness.  Musculoskeletal: Normal range of motion.  Neurological: He is alert and oriented to person, place, and time.  Skin: No rash noted.  Psychiatric: He has a normal mood and affect.  Nursing note and vitals reviewed.    ED Treatments / Results  Labs (all labs ordered are listed, but only abnormal results are displayed) Labs Reviewed  BASIC METABOLIC PANEL - Abnormal; Notable for the following components:      Result Value   Sodium 127 (*)    Chloride 89 (*)    Glucose, Bld 674 (*)    Creatinine, Ser 1.37 (*)    GFR calc non Af Amer 59 (*)    All other components within normal limits  CBC - Abnormal; Notable for the following components:   RDW 11.2 (*)    All other components within normal limits  CBG MONITORING, ED - Abnormal; Notable for the following components:   Glucose-Capillary >600 (*)    All other components within normal limits  URINALYSIS, ROUTINE W REFLEX MICROSCOPIC  CBG MONITORING, ED    EKG None  Radiology Dg Chest 2 View  Result Date: 11/30/2017 CLINICAL DATA:  Polyuria and polydipsia.  Cough. EXAM:  CHEST - 2 VIEW COMPARISON:  08/12/2017 FINDINGS: The heart size and mediastinal contours are within normal limits. Both lungs are clear. The visualized skeletal structures are unremarkable. IMPRESSION: No active cardiopulmonary disease. Electronically Signed   By: Signa Kell M.D.   On: 11/30/2017 15:36    Procedures .Critical Care Performed by: Fayrene Helper, PA-C Authorized by: Fayrene Helper, PA-C   Critical care provider statement:    Critical care time (minutes):  30   Critical care was time spent personally by me on the following activities:  Discussions with consultants, evaluation of patient's response to treatment, examination of patient, ordering  and performing treatments and interventions, ordering and review of laboratory studies, ordering and review of radiographic studies, pulse oximetry, re-evaluation of patient's condition, obtaining history from patient or surrogate and review of old charts   (including critical care time)  Medications Ordered in ED Medications  dextrose 5 %-0.45 % sodium chloride infusion ( Intravenous Hold 11/30/17 1439)  insulin regular, human (MYXREDLIN) 100 units/ 100 mL infusion (has no administration in time range)  sodium chloride 0.9 % bolus 1,000 mL (0 mLs Intravenous Stopped 11/30/17 1543)    And  sodium chloride 0.9 % bolus 1,000 mL (0 mLs Intravenous Stopped 11/30/17 1543)    And  sodium chloride 0.9 % bolus 1,000 mL (has no administration in time range)    And  0.9 %  sodium chloride infusion (has no administration in time range)  insulin aspart (novoLOG) injection 10 Units (10 Units Subcutaneous Given 11/30/17 1520)     Initial Impression / Assessment and Plan / ED Course  I have reviewed the triage vital signs and the nursing notes.  Pertinent labs & imaging results that were available during my care of the patient were reviewed by me and considered in my medical decision making (see chart for details).     BP (!) 152/99   Pulse 95    Resp 15   Ht 6\' 3"  (1.905 m)   Wt (!) 166 kg   SpO2 99%   BMI 45.75 kg/m    Final Clinical Impressions(s) / ED Diagnoses   Final diagnoses:  Hyperglycemia    ED Discharge Orders    None     2:21 PM Patient here with polyuria, polydipsia, mild abdominal discomfort and an elevated CBG greater than 600.  He is well-appearing.  He does not have a PCP and currently not on any diabetic medication.  Work-up initiated. IVF and insulin given.   3:47 PM Patient does not want to be admitted to the hospital.  His initial CBG is 674.  Elevation of his creatinine of 1.37 without prior value for comparison, his anion gap is within normal limits, chest x-ray unremarkable.  UA have not resulted yet.  3:47 PM Pt sign out to Graciella Freer, PA-C who will f/u on labs to ensure improvement of hyperglycemia.  Anticipate d/c if CBG in 300s and no ketone in urine.  Pt may be discharge with metformin and close f/u with PCP.     Fayrene Helper, PA-C 11/30/17 1549    Tilden Fossa, MD 12/03/17 0900  ADDENDUM: CC time added per request    Fayrene Helper, PA-C 12/09/17 1736    Tilden Fossa, MD 12/12/17 [phone]

## 2017-11-30 NOTE — Patient Instructions (Signed)
Patient advised to seek comprehensive evaluation in higher level of care due to the need of additional lab work for diagnosis and treatment. Discussed urgent care or emergency department for evaluation.

## 2017-12-04 ENCOUNTER — Ambulatory Visit (INDEPENDENT_AMBULATORY_CARE_PROVIDER_SITE_OTHER): Payer: 59 | Admitting: Family Medicine

## 2017-12-04 ENCOUNTER — Encounter: Payer: Self-pay | Admitting: Family Medicine

## 2017-12-04 ENCOUNTER — Other Ambulatory Visit: Payer: Self-pay

## 2017-12-04 ENCOUNTER — Other Ambulatory Visit: Payer: Self-pay | Admitting: Family Medicine

## 2017-12-04 VITALS — BP 129/84 | HR 96 | Temp 98.1°F | Ht 74.0 in | Wt 362.2 lb

## 2017-12-04 DIAGNOSIS — Z6841 Body Mass Index (BMI) 40.0 and over, adult: Secondary | ICD-10-CM

## 2017-12-04 DIAGNOSIS — E1165 Type 2 diabetes mellitus with hyperglycemia: Secondary | ICD-10-CM

## 2017-12-04 DIAGNOSIS — G4733 Obstructive sleep apnea (adult) (pediatric): Secondary | ICD-10-CM

## 2017-12-04 DIAGNOSIS — Z9119 Patient's noncompliance with other medical treatment and regimen: Secondary | ICD-10-CM

## 2017-12-04 DIAGNOSIS — I10 Essential (primary) hypertension: Secondary | ICD-10-CM

## 2017-12-04 DIAGNOSIS — Z86711 Personal history of pulmonary embolism: Secondary | ICD-10-CM

## 2017-12-04 DIAGNOSIS — Z91199 Patient's noncompliance with other medical treatment and regimen due to unspecified reason: Secondary | ICD-10-CM

## 2017-12-04 LAB — POCT URINALYSIS DIP (MANUAL ENTRY)
Bilirubin, UA: NEGATIVE
Blood, UA: NEGATIVE
Glucose, UA: 500 mg/dL — AB
Ketones, POC UA: NEGATIVE mg/dL
Leukocytes, UA: NEGATIVE
Nitrite, UA: NEGATIVE
Protein Ur, POC: NEGATIVE mg/dL
Spec Grav, UA: <=1.005 — AB (ref 1.010–1.025)
Urobilinogen, UA: 0.2 E.U./dL
pH, UA: 5 (ref 5.0–8.0)

## 2017-12-04 LAB — POCT GLYCOSYLATED HEMOGLOBIN (HGB A1C): Hemoglobin A1C: 12.8 % — AB (ref 4.0–5.6)

## 2017-12-04 LAB — GLUCOSE, POCT (MANUAL RESULT ENTRY): POC Glucose: >444 mg/dl (ref 70–99)

## 2017-12-04 MED ORDER — INSULIN GLARGINE 100 UNIT/ML SOLOSTAR PEN: 10.0000 [IU] | PEN_INJECTOR | Freq: Every day | SUBCUTANEOUS | 3 refills | Status: DC

## 2017-12-04 MED ORDER — GLUCOSE BLOOD VI STRP: ORAL_STRIP | 5 refills | Status: DC

## 2017-12-04 MED ORDER — PEN NEEDLES 32G X 4 MM MISC: 1.0000 "application " | Freq: Every day | 2 refills | Status: DC

## 2017-12-04 MED ORDER — BASAGLAR KWIKPEN 100 UNIT/ML ~~LOC~~ SOPN: 10.0000 [IU] | PEN_INJECTOR | Freq: Every day | SUBCUTANEOUS | 3 refills | Status: DC

## 2017-12-04 NOTE — Patient Instructions (Addendum)
Bring all meds with you to next visit.   I will refer you to sleep specialist for possible repeat testing and discuss treatment/machine options.  Unfortunately blood sugar is too high to read here in the office as well.  However based on your symptoms we can try to treat outpatient initially with Lantus insulin 10 units each night with increase by 2 units every 2-3 nights until blood sugars are below 200.  Recheck in 1 week.  Continue metformin same dose for now.  If you have increasing diarrhea, we may need to change that medication.   If any nausea, vomiting, abdominal pain, or any new/worsening symptoms, proceed directly to the emergency room.  I will refer you to diabetes and nutrition management classes for diabetes. Keep a record of your blood sugars and bring them to the next office visit. I will also refer you to hematologist to decide on blood thinner and duration.    Return to the clinic or go to the nearest emergency room if any of your symptoms worsen or new symptoms occur.   Insulin Injection Instructions, Using Insulin Pens, Adult A subcutaneous injection is a shot of medicine that is injected into the layer of fat between skin and muscle. People with type 1 diabetes must take insulin because their bodies do not make it. People with type 2 diabetes may need to take insulin. There are many different types of insulin. The type of insulin that you take may determine how many injections you give yourself and when you need to take the injections. Choosing a site for injection Insulin absorption varies from site to site. It is best to inject insulin within the same body area, using a different spot in that area for each injection. Do not inject the insulin in the same spot for each injection. There are five main areas that can be used for injecting. These areas include:  Abdomen. This is the preferred area.  Front of thigh.  Upper, outer side of thigh.  Back of upper  arm.  Buttocks.  Using an insulin pen First, follow the steps for Getting Ready, then continue with the steps for Injecting the Insulin. Getting Ready 1. Wash your hands with soap and water. If soap and water are not available, use hand sanitizer. 2. Check the expiration date and type of insulin in the pen. 3. If you are using CLEAR insulin, check to see that it is clear and free of clumps. 4. If you are using CLOUDY insulin, gently roll the pen between your palms several times, or tip the pen up and down several times to mix up the medicine. Do not shake the pen. 5. Remove the cap from the insulin pen. 6. Use an alcohol wipe to clean the rubber stopper of the pen cartridge. 7. Remove the protective paper tab from the disposable needle. Do not let the needle touch anything. 8. Screw the needle onto the pen. 9. Remove the outer and inner plastic covers from the needle. Do not throw away the outer plastic cover yet. 10. Prime the insulin pen by turning the button (dial) to 2 units. Hold the pen with the needle pointing up, and push the button on the opposite end of the pen until a drop of insulin appears at the needle tip. If no insulin appears, repeat this step. 11. Dial the number of units of insulin that you will be injecting. Injecting the Insulin  1. Use an alcohol wipe to clean the site where you  will be injecting the needle. Let the site air-dry. 2. Hold the pen in the palm of your writing hand with your thumb on the top. 3. If directed by your health care provider, use your other hand to pinch and hold about an inch of skin at the injection site. Do not directly touch the cleaned part of the skin. 4. Gently but quickly, put the needle straight into the skin. The needle should be at a 90-degree angle (perpendicular) to the skin, as if to form the letter "L." ? For example, if you are giving an injection in the abdomen, the abdomen forms one "leg" of the "L" and the needle forms the other  "leg" of the "L." 5. For adults who have a small amount of body fat, the needle may need to be injected at a 45-degree angle instead. Your health care provider will tell you if this is necessary. ? A 45-degree angle looks like the letter "V." 6. When the needle is completely inserted into the skin, use the thumb of your writing hand to push the top button of the pen down all the way to inject the insulin. 7. Let go of the skin that you are pinching. Continue to hold the pen in place with your writing hand. 8. Wait five seconds, then pull the needle straight out of the skin. 9. Carefully put the larger (outer) plastic cover of the needle back over the needle, then unscrew the capped needle and discard it in a sharps container, such as an empty plastic bottle with a cover. 10. Put the plastic cap back on the insulin pen. Throwing away supplies  Discard all used needles in a puncture-proof sharps disposal container. You can ask your local pharmacy about where you can get this kind of disposal container, or you can use an empty liquid laundry detergent bottle that has a cover.  Follow the disposal regulations for the area where you live. Do not use any needle more than one time.  Throw away empty disposable pens in the regular trash. What questions should I ask my health care provider?  How often should I be taking insulin?  How often should I check my blood glucose?  What amount of insulin should I be taking at each time?  What are the side effects?  What should I do if my blood glucose is too high?  What should I do if my blood glucose is too low?  What should I do if I forget to take my insulin?  What number should I call if I have questions? Where can I get more information?  American Diabetes Association (ADA): www.diabetes.org  American Association of Diabetes Educators (AADE) Patient Resources: https://www.diabeteseducator.org/patient-resources This information is not intended  to replace advice given to you by your health care provider. Make sure you discuss any questions you have with your health care provider. Document Released: 03/03/2015 Document Revised: 07/06/2015 Document Reviewed: 03/03/2015 Elsevier Interactive Patient Education  [employer].    If you have lab work done today you will be contacted with your lab results within the next 2 weeks.  If you have not heard from [place] then please contact us. The fastest way to get your results is to register for My Chart.   IF you received an x-ray today, you will receive an invoice from [facility]. Please contact [facility] at [phone] with questions or concerns regarding your invoice.   IF you received labwork today, you will receive an invoice  from [facility]. Please contact [facility] at [phone] with questions or concerns regarding your invoice.   Our billing staff will not be able to assist you with questions regarding bills from these companies.  You will be contacted with the lab results as soon as they are available. The fastest way to get your results is to activate your My Chart account. Instructions are located on the last page of this paperwork. If you have not heard from [place] regarding the results in 2 weeks, please contact this office.

## 2017-12-04 NOTE — Progress Notes (Signed)
Subjective:  By signing my name below, I, Stann Ore, attest that this documentation has been prepared under the direction and in the presence of [name], MD. Electronically Signed: Stann Ore, Scribe. 12/04/2017 , 3:46 PM .  Patient was seen in Room 3 .   Patient ID: Phillip Frye, male    DOB: [birth year], 47 y.o.   MRN: [id number] Chief Complaint  Patient presents with  . Establish Care  . Diabetes  . Sleep Apnea   HPI Phillip Frye is a 47 y.o. male  Here to establish care. He has a history of multiple medical problems including HTN, GERD, diabetes, history of PE, obesity, OSA, and tobacco use with cigars. He has been seen in the ER multiple times since February; most recently on Oct 20th, at that time seen for polyuria and polydipsia. Last seen by PCP several years ago due to financial complications. His last meal was about 2 hours ago. Appears he was a previous patient of Dr. 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 He was taught by his grandmother, who also baked and cooked everything.   15 min of chart review, prior to visit. Over 45 minutes visit, greater than 50% counseling.  Diabetes - review from Oct 20th Patient had been off of his medications for diabetes for years; had reported previous use of insulin pen. His glucose was over 700 on the istat; ultimately declined to 486, 382 and then 319. There is no recent A1C. He had normal bicarb and anion gap, no evidence of ketones in urine at ER visit on Oct 20th. He was treated with fluids, insulin drip and subcutaneous insulin. He declined hospital admission. He was started on metformin 500 mg bid after readings were down to 319.   Patient states he's doing better from ER visit 4 days ago. He's still feeling thirsty though drinking plenty of water. He bought a glucometer but didn't have testing strips; requests prescription of the testing strips. He hasn't been able to check his home sugars yet.  He noticed some diarrhea with metformin. He denies nausea, vomiting, or abdominal pain. He recalls previously being on an insulin pen, but doesn't recall dosing. He had stopped all diabetes medication prior for a few years.    History of OSA He was previously treated with CPAP. He hasn't been using his CPAP machine; last use was about 2-3 years ago. He doesn't remember where his CPAP machine is.    HTN His BP was 152/99 in the ER; not currently on medications. Appears he had a temporary refill in Jan at [facility], but wasn't taking it when seen at [facility] on July 18th. He recently filled Amlodipine 5 mg earlier this week.    History of PE Appears patient was admitted in Aug 2014 and started on Xarelto for bilateral PE. He followed up with cardiology in Nov 2014; plan for exercise echo for abnormal EKG. He had moderate LVH on echo, plan treatment in 6 months in 2014. Obesity thought to be the main risk factor. Discussion of continuing Xarelto until BMI less than 30. He's been off of his Xarelto for a few years now. He mentions previously informed having irregular heart rate a few years ago by cardiology.    Obesity Wt Readings from Last 3 Encounters:  12/04/17 (!) 362 lb 3.2 oz (164.3 kg)  11/30/17 (!) 366 lb (166 kg)  11/30/17 (!) 366 lb (166 kg)   Body mass index is 46.5  kg/m.  He had laparoscopic gastric sleeve resection in April 2015; appears he was a patient of Dr. Dorothyann Peng at that time. His weight at that time had gone down, but then the weight came back up because he was scared of losing too much weight at one time.   Patient Active Problem List   Diagnosis Date Noted  . 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 Abnormal EKG 12/23/2012  . Obesity, Class III, BMI 40-49.9 (morbid obesity) (HCC) 12/11/2012  . SOB (shortness of breath) 09/22/2012  . Pulmonary embolism, bilateral (HCC) 09/22/2012  . Diabetes mellitus without complication (HCC)   . DENTAL PAIN  03/31/2008  . CARPAL TUNNEL SYNDROME, BILATERAL 10/09/2007  . ULNAR NEUROPATHY 10/09/2007  . DENTAL CARIES 10/09/2007  . GERD 10/09/2007  . INGUINAL PAIN, BILATERAL 02/26/2007  . KNEE PAIN, RIGHT 12/19/2006  . FOOT PAIN, BILATERAL 12/19/2006  . HYPERTENSION 09/18/2006  . AVASCULAR NECROSIS 09/18/2006  . SLEEP APNEA 09/18/2006   Past Medical History:  Diagnosis Date  . Arthritis   . Diabetes mellitus without complication (HCC)   . Dysrhythmia    unknown type  . GERD (gastroesophageal reflux disease)   . Hypertension    MD stopped BP med 1 yr ago due to normal BP  . Obesity   . Pulmonary embolism, bilateral (HCC) 09/2012   on xarelto  . Shortness of breath    with exertion  . Sleep apnea    uses c pap    Past Surgical History:  Procedure Laterality Date  . BREATH TEK H PYLORI N/A 12/28/2012   Procedure: BREATH TEK H PYLORI;  Adami: Atilano Ina, MD;  Location: [facility];  Service: General;  Laterality: N/A;  . HIP SURGERY Bilateral    to "shave gthe bones"  . LAPAROSCOPIC GASTRIC SLEEVE RESECTION N/A 05/31/2013   Procedure: LAPAROSCOPIC GASTRIC SLEEVE RESECTION;  Jacuinde: Atilano Ina, MD;  Location: [facility];  Service: General;  Laterality: N/A;   No Known Allergies Prior to Admission medications   Medication Sig Start Date End Date Taking? Authorizing Provider  levocetirizine (XYZAL) 5 MG tablet Take 1 tablet (5 mg total) by mouth every evening. Patient not taking: Reported on 11/30/2017 08/23/17   Worthy Rancher B, FNP  metFORMIN (GLUCOPHAGE) 500 MG tablet Take 1 tablet (500 mg total) by mouth 2 (two) times daily with a meal. 11/30/17 12/30/17  Maxwell Caul, PA-C  metoprolol (LOPRESSOR) 50 MG tablet Take 50 mg by mouth 2 (two) times daily.    04/21/11  [provider]   Social History   Socioeconomic History  . Marital status: Single    Spouse name: Not on file  . Number of children: Not on file  . Years of education: Not on file  . Highest  education level: Not on file  Occupational History  . Not on file  Social Needs  . Financial resource strain: Not on file  . Food insecurity:    Worry: Not on file    Inability: Not on file  . Transportation needs:    Medical: Not on file    Non-medical: Not on file  Tobacco Use  . Smoking status: Current Every Day Smoker    Packs/day: 1.00    Years: 6.00    Pack years: 6.00    Types: Cigars  . Smokeless tobacco: Never Used  Substance and Sexual Activity  . Alcohol use: Yes    Comment: occasional  . Drug use: No  . Sexual activity: Not  on file  Lifestyle  . Physical activity:    Days per week: Not on file    Minutes per session: Not on file  . Stress: Not on file  Relationships  . Social connections:    Talks on phone: Not on file    Gets together: Not on file    Attends religious service: Not on file    Active member of club or organization: Not on file    Attends meetings of clubs or organizations: Not on file    Relationship status: Not on file  . Intimate partner violence:    Fear of current or ex partner: Not on file    Emotionally abused: Not on file    Physically abused: Not on file    Forced sexual activity: Not on file  Other Topics Concern  . Not on file  Social History Narrative   Lives alone.  Works at [employer].   Review of Systems  Constitutional: Negative for fatigue and unexpected weight change.  Eyes: Negative for visual disturbance.  Respiratory: Negative for cough, chest tightness and shortness of breath.   Cardiovascular: Negative for chest pain, palpitations and leg swelling.  Gastrointestinal: Positive for diarrhea. Negative for abdominal pain, blood in stool, nausea and vomiting.  Neurological: Negative for dizziness, light-headedness and headaches.       Objective:   Physical Exam  Constitutional: He is oriented to person, place, and time. He appears well-developed and well-nourished.  HENT:  Head: Normocephalic and atraumatic.    Eyes: Pupils are equal, round, and reactive to light. EOM are normal.  Neck: No JVD present. Carotid bruit is not present.  Cardiovascular: Normal rate, regular rhythm and normal heart sounds.  No murmur heard. Pulmonary/Chest: Effort normal and breath sounds normal. He has no rhonchi. He has no rales.  Distant breath sounds, but no rhonchi or rales  Musculoskeletal: He exhibits no edema.  Neurological: He is alert and oriented to person, place, and time.  Skin: Skin is warm and dry.  Psychiatric: He has a normal mood and affect.  Vitals reviewed.   Vitals:   12/04/17 1450  BP: 129/84  Pulse: 96  Temp: 98.1 F (36.7 C)  TempSrc: Oral  SpO2: 98%  Weight: (!) 362 lb 3.2 oz (164.3 kg)  Height: 6\' 2"  (1.88 m)   Results for orders placed or performed in visit on 12/04/17  POCT urinalysis dipstick  Result Value Ref Range   Color, UA yellow yellow   Clarity, UA clear clear   Glucose, UA =500 (A) negative mg/dL   Bilirubin, UA negative negative   Ketones, POC UA negative negative mg/dL   Spec Grav, UA <=1.610 (A) 1.010 - 1.025   Blood, UA negative negative   pH, UA 5.0 5.0 - 8.0   Protein Ur, POC negative negative mg/dL   Urobilinogen, UA 0.2 0.2 or 1.0 E.U./dL   Nitrite, UA Negative Negative   Leukocytes, UA Negative Negative  POCT glucose (manual entry)  Result Value Ref Range   POC Glucose >444 70 - 99 mg/dl  POCT glycosylated hemoglobin (Hb A1C)  Result Value Ref Range   Hemoglobin A1C 12.8 (A) 4.0 - 5.6 %   HbA1c POC (<> result, manual entry)     HbA1c, POC (prediabetic range)     HbA1c, POC (controlled diabetic range)         Assessment & Plan:    Dayvon Dax Westbrooks is a 47 y.o. male Type 2 diabetes mellitus with hyperglycemia, without  long-term current use of insulin (HCC) - Plan: POCT urinalysis dipstick, POCT glucose (manual entry), POCT glycosylated hemoglobin (Hb A1C), Microalbumin/Creatinine Ratio, Urine, glucose blood test strip, Amb ref to Medical Nutrition  Therapy-MNT, Basic metabolic panel, Insulin Pen Needle (PEN NEEDLES) 32G X 4 MM MISC, Insulin Glargine (BASAGLAR KWIKPEN) 100 UNIT/ML SOPN, DISCONTINUED: Insulin Glargine (LANTUS SOLOSTAR) 100 UNIT/ML Solostar Pen Class 3 severe obesity with serious comorbidity and body mass index (BMI) of 45.0 to 49.9 in adult, unspecified obesity type (HCC) History of nonadherence to medical treatment   -Uncontrolled diabetes with previous medication nonadherence.  Discussed and identified any barriers to care, previously appears to have been cost.  Advised to let me know if there are any other barriers to care in the future or any cost issues with medication.  -Glucose too high to read in office, but less symptoms than when seen in ER, no ketones in urine.  Decided on initial outpatient treatment with ER precautions  -Check BMP, evaluate for acidosis.  Unlikely.  -Start Lantus 10 units/day with increase by 2 units every 2 to 3 days until readings below 200  -Continue metformin, but if experiencing diarrhea may need to decrease dose or discontinue  - refer to DM classes/nutritionist  -Recheck 1 week, ER precautions prior to that time if needed.   Essential hypertension - Plan: Basic metabolic panel  - improved with restart of amlodipine, no changes for now  History of pulmonary embolism - Plan: Ambulatory referral to Hematology  -Remote history of bilateral PE.  Off Xarelto for some time.  - refer to hematology to decide on agent and duration of therapy if anticoagulation needed as still with RF of obesity.   OSA (obstructive sleep apnea) - Plan: Ambulatory referral to Sleep Studies  -Off CPAP for some time.  Refer to sleep studies to decide on repeat/updated testing and prescription for CPAP  Meds ordered this encounter  Medications  . glucose blood test strip    Sig: Up to 3 times per day - DM2 with hyperglycemia. Uncontrolled.    Dispense:  100 each    Refill:  5  . DISCONTD: Insulin Glargine (LANTUS  SOLOSTAR) 100 UNIT/ML Solostar Pen    Sig: Inject 10 Units into the skin daily.    Dispense:  5 pen    Refill:  3  . Insulin Pen Needle (PEN NEEDLES) 32G X 4 MM MISC    Sig: 1 application by Does not apply route daily.    Dispense:  90 each    Refill:  2  . Insulin Glargine (BASAGLAR KWIKPEN) 100 UNIT/ML SOPN    Sig: Inject 0.1 mLs (10 Units total) into the skin daily.    Dispense:  15 mL    Refill:  3   Patient Instructions    Bring all meds with you to next visit.   I will refer you to sleep specialist for possible repeat testing and discuss treatment/machine options.  Unfortunately blood sugar is too high to read here in the office as well.  However based on your symptoms we can try to treat outpatient initially with Lantus insulin 10 units each night with increase by 2 units every 2-3 nights until blood sugars are below 200.  Recheck in 1 week.  Continue metformin same dose for now.  If you have increasing diarrhea, we may need to change that medication.   If any nausea, vomiting, abdominal pain, or any new/worsening symptoms, proceed directly to the emergency room.  I will refer  you to diabetes and nutrition management classes for diabetes. Keep a record of your blood sugars and bring them to the next office visit. I will also refer you to hematologist to decide on blood thinner and duration.    Return to the clinic or go to the nearest emergency room if any of your symptoms worsen or new symptoms occur.   Insulin Injection Instructions, Using Insulin Pens, Adult A subcutaneous injection is a shot of medicine that is injected into the layer of fat between skin and muscle. People with type 1 diabetes must take insulin because their bodies do not make it. People with type 2 diabetes may need to take insulin. There are many different types of insulin. The type of insulin that you take may determine how many injections you give yourself and when you need to take the  injections. Choosing a site for injection Insulin absorption varies from site to site. It is best to inject insulin within the same body area, using a different spot in that area for each injection. Do not inject the insulin in the same spot for each injection. There are five main areas that can be used for injecting. These areas include:  Abdomen. This is the preferred area.  Front of thigh.  Upper, outer side of thigh.  Back of upper arm.  Buttocks.  Using an insulin pen First, follow the steps for Getting Ready, then continue with the steps for Injecting the Insulin. Getting Ready 1. Wash your hands with soap and water. If soap and water are not available, use hand sanitizer. 2. Check the expiration date and type of insulin in the pen. 3. If you are using CLEAR insulin, check to see that it is clear and free of clumps. 4. If you are using CLOUDY insulin, gently roll the pen between your palms several times, or tip the pen up and down several times to mix up the medicine. Do not shake the pen. 5. Remove the cap from the insulin pen. 6. Use an alcohol wipe to clean the rubber stopper of the pen cartridge. 7. Remove the protective paper tab from the disposable needle. Do not let the needle touch anything. 8. Screw the needle onto the pen. 9. Remove the outer and inner plastic covers from the needle. Do not throw away the outer plastic cover yet. 10. Prime the insulin pen by turning the button (dial) to 2 units. Hold the pen with the needle pointing up, and push the button on the opposite end of the pen until a drop of insulin appears at the needle tip. If no insulin appears, repeat this step. 11. Dial the number of units of insulin that you will be injecting. Injecting the Insulin  1. Use an alcohol wipe to clean the site where you will be injecting the needle. Let the site air-dry. 2. Hold the pen in the palm of your writing hand with your thumb on the top. 3. If directed by your  health care provider, use your other hand to pinch and hold about an inch of skin at the injection site. Do not directly touch the cleaned part of the skin. 4. Gently but quickly, put the needle straight into the skin. The needle should be at a 90-degree angle (perpendicular) to the skin, as if to form the letter "L." ? For example, if you are giving an injection in the abdomen, the abdomen forms one "leg" of the "L" and the needle forms the other "leg" of the "  L." 5. For adults who have a small amount of body fat, the needle may need to be injected at a 45-degree angle instead. Your health care provider will tell you if this is necessary. ? A 45-degree angle looks like the letter "V." 6. When the needle is completely inserted into the skin, use the thumb of your writing hand to push the top button of the pen down all the way to inject the insulin. 7. Let go of the skin that you are pinching. Continue to hold the pen in place with your writing hand. 8. Wait five seconds, then pull the needle straight out of the skin. 9. Carefully put the larger (outer) plastic cover of the needle back over the needle, then unscrew the capped needle and discard it in a sharps container, such as an empty plastic bottle with a cover. 10. Put the plastic cap back on the insulin pen. Throwing away supplies  Discard all used needles in a puncture-proof sharps disposal container. You can ask your local pharmacy about where you can get this kind of disposal container, or you can use an empty liquid laundry detergent bottle that has a cover.  Follow the disposal regulations for the area where you live. Do not use any needle more than one time.  Throw away empty disposable pens in the regular trash. What questions should I ask my health care provider?  How often should I be taking insulin?  How often should I check my blood glucose?  What amount of insulin should I be taking at each time?  What are the side  effects?  What should I do if my blood glucose is too high?  What should I do if my blood glucose is too low?  What should I do if I forget to take my insulin?  What number should I call if I have questions? Where can I get more information?  American Diabetes Association (ADA): www.diabetes.org  American Association of Diabetes Educators (AADE) Patient Resources: https://www.diabeteseducator.org/patient-resources This information is not intended to replace advice given to you by your health care provider. Make sure you discuss any questions you have with your health care provider. Document Released: 03/03/2015 Document Revised: 07/06/2015 Document Reviewed: 03/03/2015 Elsevier Interactive Patient Education  [employer].    If you have lab work done today you will be contacted with your lab results within the next 2 weeks.  If you have not heard from [place] then please contact us. The fastest way to get your results is to register for My Chart.   IF you received an x-ray today, you will receive an invoice from [facility]. Please contact [facility] at [phone] with questions or concerns regarding your invoice.   IF you received labwork today, you will receive an invoice from [facility]. Please contact [facility] at [phone] with questions or concerns regarding your invoice.   Our billing staff will not be able to assist you with questions regarding bills from these companies.  You will be contacted with the lab results as soon as they are available. The fastest way to get your results is to activate your My Chart account. Instructions are located on the last page of this paperwork. If you have not heard from [place] regarding the results in 2 weeks, please contact this office.       I personally performed the services described in this documentation, which was scribed in my presence. The recorded information has been reviewed and considered for accuracy and  completeness, addended by me as needed, and agree with information above.  Signed,   [name], MD [facility] at [facility].  12/04/17 6:39 PM

## 2017-12-05 ENCOUNTER — Other Ambulatory Visit: Payer: Self-pay

## 2017-12-05 LAB — BASIC METABOLIC PANEL
BUN/Creatinine Ratio: 14 (ref 9–20)
BUN: 18 mg/dL (ref 6–24)
CO2: 23 mmol/L (ref 20–29)
Calcium: 9.8 mg/dL (ref 8.7–10.2)
Chloride: 91 mmol/L — ABNORMAL LOW (ref 96–106)
Creatinine, Ser: 1.26 mg/dL (ref 0.76–1.27)
GFR calc Af Amer: 77 mL/min/{1.73_m2} (ref >59)
GFR calc non Af Amer: 67 mL/min/{1.73_m2} (ref >59)
Glucose: 523 mg/dL (ref 65–99)
Potassium: 4.6 mmol/L (ref 3.5–5.2)
Sodium: 131 mmol/L — ABNORMAL LOW (ref 134–144)

## 2017-12-05 LAB — MICROALBUMIN / CREATININE URINE RATIO
Creatinine, Urine: 32.3 mg/dL
Microalb/Creat Ratio: <9.3 mg/g creat (ref 0.0–30.0)
Microalbumin, Urine: <3 ug/mL

## 2017-12-05 NOTE — Progress Notes (Signed)
Per pharmacy, changed from 15ml to 5 pens.

## 2017-12-09 ENCOUNTER — Ambulatory Visit: Payer: Self-pay | Admitting: Registered"

## 2017-12-10 ENCOUNTER — Encounter: Payer: 59 | Attending: Family Medicine | Admitting: *Deleted

## 2017-12-10 DIAGNOSIS — E119 Type 2 diabetes mellitus without complications: Secondary | ICD-10-CM | POA: Insufficient documentation

## 2017-12-10 DIAGNOSIS — E1165 Type 2 diabetes mellitus with hyperglycemia: Secondary | ICD-10-CM | POA: Diagnosis not present

## 2017-12-10 DIAGNOSIS — Z713 Dietary counseling and surveillance: Secondary | ICD-10-CM | POA: Diagnosis not present

## 2017-12-10 NOTE — Patient Instructions (Signed)
Plan:  Aim for 4 Carb Choices per meal (60 grams) +/- 1 either way  Aim for 0-2 Carbs per snack if hungry  Include protein in moderation with your meals and snacks Consider reading food labels for Total Carbohydrate of foods Consider  increasing your activity level by swimming or walking for 15-30 minutes daily as tolerated Continue checking BG at alternate times per day as directed by MD  Continue taking medication as directed by MD

## 2017-12-11 NOTE — Progress Notes (Signed)
Diabetes Self-Management Education  Visit Type: First/Initial  Appt. Start Time: 0800 Appt. End Time: 0930  12/11/2017  [name], identified by name and date of birth, is a 47 y.o. male with a diagnosis of Diabetes: Type 2. He is here with his sister who participated in the visit. He has made significant changes to his eating habits and beverages. He is new to insulin (basaglar @ 10 units to start) and metformin. He states his BG are still elevated @ 295-300 and over 500 after meals. 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   ASSESSMENT  Height 6\' 2"  (1.88 m), weight (!) 355 lb 11.2 oz (161.3 kg). Body mass index is 45.67 kg/m.  Diabetes Self-Management Education - 12/10/17 0805      Visit Information   Visit Type  First/Initial      Initial Visit   Diabetes Type  Type 2    Are you currently following a meal plan?  No    Date Diagnosed  11/25/2017      Health Coping   How would you rate your overall health?  Fair      Psychosocial Assessment   Patient Belief/Attitude about Diabetes  Afraid    Self-care barriers  None    Self-management support  Family    Other persons present  Patient    Patient Concerns  Glycemic Control;Nutrition/Meal planning;Weight Control    Special Needs  None    Preferred Learning Style  No preference indicated    Learning Readiness  Change in progress    How often do you need to have someone help you when you read instructions, pamphlets, or other written materials from your doctor or pharmacy?  1 - Never    What is the last grade level you completed in school?  12      Pre-Education Assessment   Patient understands the diabetes disease and treatment process.  Needs Instruction    Patient understands incorporating nutritional management into lifestyle.  Needs Instruction    Patient undertands incorporating physical activity into lifestyle.  Needs Instruction    Patient understands using medications safely.  Needs Instruction    Patient understands monitoring blood glucose, interpreting and using results  Needs Instruction    Patient understands prevention, detection, and treatment of acute complications.  Needs Instruction    Patient understands prevention, detection, and treatment of chronic complications.  Needs Instruction    Patient understands how to develop strategies to address psychosocial issues.  Needs Instruction    Patient understands how to develop strategies to promote health/change behavior.  Needs Instruction      Complications   Last HgB A1C per patient/outside source  12.8 %    How often do you check your blood sugar?  3-4 times/day    Number of hypoglycemic episodes per month  0    Have you had a dilated eye exam in the past 12 months?  Yes    Have you had a dental exam in the past 12 months?  Yes    Are you checking your feet?  Yes    How many days per week are you checking your feet?  1      Dietary Intake   Breakfast  6:30 AM - bacon egg and cheese sandwich     Snack (morning)  food tasting, cookies, fresh fruit    Lunch  skips lately    Dinner  5 PM meat, starch (bread) alot of sandwiches (not many vegetables)    Beverage(s)  regular soda all day, water at home      Exercise   Exercise Type  ADL's      Patient Education   Previous Diabetes Education  No    Disease state   Definition of diabetes, type 1 and 2, and the diagnosis of diabetes;Factors that contribute to the development of diabetes    Nutrition management   Role of diet in the treatment of diabetes and the relationship between the three main macronutrients and blood glucose level;Carbohydrate counting;Reviewed blood glucose goals for pre and post meals and how to evaluate the patients' food intake on their blood glucose level.;Food label reading, portion sizes and measuring food.    Physical activity and exercise   Role of exercise on diabetes management,  blood pressure control and cardiac health.;Helped patient identify appropriate exercises in relation to his/her diabetes, diabetes complications and other health issue.    Medications  Reviewed patients medication for diabetes, action, purpose, timing of dose and side effects.    Monitoring  Purpose and frequency of SMBG.;Identified appropriate SMBG and/or A1C goals.    Chronic complications  Relationship between chronic complications and blood glucose control    Psychosocial adjustment  Role of stress on diabetes      Individualized Goals (developed by patient)   Nutrition  Follow meal plan discussed    Physical Activity  Exercise 3-5 times per week    Medications  take my medication as prescribed    Monitoring   test blood glucose pre and post meals as discussed      Post-Education Assessment   Patient understands the diabetes disease and treatment process.  Demonstrates understanding / competency    Patient understands incorporating nutritional management into lifestyle.  Demonstrates understanding / competency    Patient undertands incorporating physical activity into lifestyle.  Demonstrates understanding / competency    Patient understands using medications safely.  Demonstrates understanding / competency    Patient understands monitoring blood glucose, interpreting and using results  Demonstrates understanding / competency    Patient understands prevention, detection, and treatment of acute complications.  Demonstrates understanding / competency    Patient understands prevention, detection, and treatment of chronic complications.  Demonstrates understanding / competency    Patient understands how to develop strategies to address psychosocial issues.  Demonstrates understanding / competency    Patient understands how to develop strategies to promote health/change behavior.  Demonstrates understanding / competency      Outcomes   Expected Outcomes  Demonstrated interest in learning.  Expect positive outcomes    Future DMSE  4-6 wks    Program Status  Completed       Individualized Plan for Diabetes Self-Management Training:   Learning Objective:  Patient will have a greater understanding of diabetes self-management. Patient education plan is to attend individual and/or group sessions per assessed needs and concerns.   Plan:   Patient Instructions  Plan:  Aim for 4 Carb Choices per meal (60 grams) +/- 1 either way  Aim for 0-2 Carbs per snack if hungry  Include protein in moderation with your meals and snacks Consider reading food labels for Total Carbohydrate of foods Consider  increasing your activity level by swimming or walking for 15-30 minutes daily as tolerated Continue checking BG at alternate times per day as directed by MD  Continue taking medication as directed by  MD  Expected Outcomes:  Demonstrated interest in learning. Expect positive outcomes  Education material provided: ADA Diabetes: Your Take Control Guide, Food label handouts, A1C conversion sheet, Meal plan card and Carbohydrate counting sheet, List of [facility]'s in the area, Insulin Action handout  If problems or questions, patient to contact team via:  Phone  Future DSME appointment: 4-6 wks

## 2017-12-12 ENCOUNTER — Encounter: Payer: Self-pay | Admitting: Family Medicine

## 2017-12-12 ENCOUNTER — Ambulatory Visit: Payer: 59 | Admitting: Family Medicine

## 2017-12-12 ENCOUNTER — Other Ambulatory Visit: Payer: Self-pay

## 2017-12-12 VITALS — BP 129/83 | HR 94 | Temp 97.9°F | Ht 74.0 in | Wt 357.2 lb

## 2017-12-12 DIAGNOSIS — Z794 Long term (current) use of insulin: Secondary | ICD-10-CM

## 2017-12-12 DIAGNOSIS — E1165 Type 2 diabetes mellitus with hyperglycemia: Secondary | ICD-10-CM

## 2017-12-12 LAB — GLUCOSE, POCT (MANUAL RESULT ENTRY): POC Glucose: 371 mg/dl — AB (ref 70–99)

## 2017-12-12 NOTE — Patient Instructions (Addendum)
   Increase Basaglar to 14 units tomorrow morning. After tomorrow, increase Basaglar insulin by 2 units every 2 to 3 days until your blood sugar readings are below 200.  Once you have reached that number (below 200) then to stay at that dose.  Continue metformin same dose for now and recheck with me in 1 week.  Return to the clinic or go to the nearest emergency room if any of your symptoms worsen or new symptoms occur.   If you have lab work done today you will be contacted with your lab results within the next 2 weeks.  If you have not heard from [place] then please contact us. The fastest way to get your results is to register for My Chart.   IF you received an x-ray today, you will receive an invoice from [facility]. Please contact [facility] at [phone] with questions or concerns regarding your invoice.   IF you received labwork today, you will receive an invoice from [facility]. Please contact [facility] at [phone] with questions or concerns regarding your invoice.   Our billing staff will not be able to assist you with questions regarding bills from these companies.  You will be contacted with the lab results as soon as they are available. The fastest way to get your results is to activate your My Chart account. Instructions are located on the last page of this paperwork. If you have not heard from [place] regarding the results in 2 weeks, please contact this office.

## 2017-12-12 NOTE — Progress Notes (Signed)
Subjective:  By signing my name below, I, Phillip Frye, attest that this documentation has been prepared under the direction and in the presence of [name], MD. Electronically Signed: [name], Scribe. 12/12/2017 , 5:25 PM .  Patient was seen in Room 8 .   Patient ID: Phillip Frye, male    DOB: 01/29/1971, 47 y.o.   MRN: [id number] Chief Complaint  Patient presents with  . Diabetes    f/u   HPI Phillip Frye is a 47 y.o. male Here for follow up diabetes. Patient was last seen for office visit on Oct 24th. He had just restarted on medication after years of non adherence. At last visit, he was started on Basaglar 10 units, initially with plan for increase by 2 units every 2-3 days, and was continued on metformin.   Patient states he's doing well on new regime, and denies any side effects. He's been checking his blood sugar at home, running in 290s-300s. He spoke with diabetic nutritionist and was suggested to increase his insulin. He notes currently on Basaglar 10 units in the morning. He did note having some stomach cramps, but noticed after drinking too much water; this did resolve. He denies nausea, vomiting or diarrhea. He does feel sleepy and fatigued today, but he had been at work since 3:00 AM this morning. Urine micro albumin checked on Oct 24th: < 3.0  He takes amlodipine for BP.   Patient Active Problem List   Diagnosis Date Noted  . 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 Abnormal EKG 12/23/2012  . Obesity, Class III, BMI 40-49.9 (morbid obesity) (HCC) 12/11/2012  . SOB (shortness of breath) 09/22/2012  . Pulmonary embolism, bilateral (HCC) 09/22/2012  . Diabetes mellitus without complication (HCC)   . DENTAL PAIN 03/31/2008  . CARPAL TUNNEL SYNDROME, BILATERAL 10/09/2007  . ULNAR NEUROPATHY 10/09/2007  . DENTAL CARIES 10/09/2007  . GERD 10/09/2007  . INGUINAL PAIN, BILATERAL 02/26/2007  . KNEE PAIN, RIGHT 12/19/2006  . FOOT PAIN, BILATERAL  12/19/2006  . HYPERTENSION 09/18/2006  . AVASCULAR NECROSIS 09/18/2006  . SLEEP APNEA 09/18/2006   Past Medical History:  Diagnosis Date  . Arthritis   . Diabetes mellitus without complication (HCC)   . Dysrhythmia    unknown type  . GERD (gastroesophageal reflux disease)   . Hypertension    MD stopped BP med 1 yr ago due to normal BP  . Obesity   . Pulmonary embolism, bilateral (HCC) 09/2012   on xarelto  . Shortness of breath    with exertion  . Sleep apnea    uses c pap    Past Surgical History:  Procedure Laterality Date  . BREATH TEK H PYLORI N/A 12/28/2012   Procedure: BREATH TEK H PYLORI;  Pickrel: Phillip Ina, MD;  Location: [facility];  Service: General;  Laterality: N/A;  . HIP SURGERY Bilateral    to "shave gthe bones"  . LAPAROSCOPIC GASTRIC SLEEVE RESECTION N/A 05/31/2013   Procedure: LAPAROSCOPIC GASTRIC SLEEVE RESECTION;  Phillip Frye: Phillip Ina, MD;  Location: [facility];  Service: General;  Laterality: N/A;   No Known Allergies Prior to Admission medications   Medication Sig Start Date End Date Taking? Authorizing Provider  glucose blood test strip Up to 3 times per day - DM2 with hyperglycemia. Uncontrolled. 12/04/17   Shade Flood, MD  Insulin Glargine (BASAGLAR KWIKPEN) 100 UNIT/ML SOPN Inject 0.1 mLs (10 Units total) into the skin daily. 12/05/17   Shade Flood, MD  Insulin Pen Needle (PEN NEEDLES) 32G X 4 MM MISC 1 application by Does not apply route daily. 12/04/17   Shade Flood, MD  levocetirizine (XYZAL) 5 MG tablet Take 1 tablet (5 mg total) by mouth every evening. Patient not taking: Reported on 11/30/2017 08/23/17   Worthy Rancher B, FNP  metFORMIN (GLUCOPHAGE) 500 MG tablet Take 1 tablet (500 mg total) by mouth 2 (two) times daily with a meal. 11/30/17 12/30/17  Maxwell Caul, PA-C  metoprolol (LOPRESSOR) 50 MG tablet Take 50 mg by mouth 2 (two) times daily.    04/21/11  [provider]   Social History   Socioeconomic  History  . Marital status: Single    Spouse name: Not on file  . Number of children: Not on file  . Years of education: Not on file  . Highest education level: Not on file  Occupational History  . Not on file  Social Needs  . Financial resource strain: Not on file  . Food insecurity:    Worry: Not on file    Inability: Not on file  . Transportation needs:    Medical: Not on file    Non-medical: Not on file  Tobacco Use  . Smoking status: Current Every Day Smoker    Packs/day: 1.00    Years: 6.00    Pack years: 6.00    Types: Cigars  . Smokeless tobacco: Never Used  Substance and Sexual Activity  . Alcohol use: Yes    Comment: occasional  . Drug use: No  . Sexual activity: Not on file  Lifestyle  . Physical activity:    Days per week: Not on file    Minutes per session: Not on file  . Stress: Not on file  Relationships  . Social connections:    Talks on phone: Not on file    Gets together: Not on file    Attends religious service: Not on file    Active member of club or organization: Not on file    Attends meetings of clubs or organizations: Not on file    Relationship status: Not on file  . Intimate partner violence:    Fear of current or ex partner: Not on file    Emotionally abused: Not on file    Physically abused: Not on file    Forced sexual activity: Not on file  Other Topics Concern  . Not on file  Social History Narrative   Lives alone.  Works at [employer].   Review of Systems  Constitutional: Negative for fatigue and unexpected weight change.  Eyes: Negative for visual disturbance.  Respiratory: Negative for cough, chest tightness and shortness of breath.   Cardiovascular: Negative for chest pain, palpitations and leg swelling.  Gastrointestinal: Negative for abdominal pain, blood in stool, diarrhea, nausea and vomiting.  Neurological: Negative for dizziness, light-headedness and headaches.       Objective:   Physical Exam  Constitutional: He  is oriented to person, place, and time. He appears well-developed and well-nourished.  HENT:  Head: Normocephalic and atraumatic.  Eyes: Pupils are equal, round, and reactive to light. EOM are normal.  Neck: No JVD present. Carotid bruit is not present.  Cardiovascular: Normal rate, regular rhythm and normal heart sounds.  No murmur heard. Pulmonary/Chest: Effort normal and breath sounds normal. He has no rales.  Musculoskeletal: He exhibits no edema.  Neurological: He is alert and oriented to person, place, and time.  Skin: Skin is warm and dry.  Psychiatric: He has a normal mood and affect.  Vitals reviewed.   Vitals:   12/12/17 1700  BP: 129/83  Pulse: 94  Temp: 97.9 F (36.6 C)  TempSrc: Oral  SpO2: 99%  Weight: (!) 357 lb 3.2 oz (162 kg)  Height: 6\' 2"  (1.88 m)   Results for orders placed or performed in visit on 12/12/17  POCT glucose (manual entry)  Result Value Ref Range   POC Glucose 371 (A) 70 - 99 mg/dl       Assessment & Plan:  Dmoni Fortson Maris is a 47 y.o. male Type 2 diabetes mellitus with hyperglycemia, with long-term current use of insulin (HCC) - Plan: POCT glucose (manual entry)  -Uncontrolled with prior medication nonadherence.  Unfortunately has not increased insulin dosing as discussed last visit.  Reinforced plan with understanding expressed.  -Basaglar 14 units for now, then increase 2 units every 2 to 3 days until readings below 200.    -ER precautions given if any nausea/vomiting, abdominal pain or other new/worsening symptoms.  Understanding was expressed.  recheck 1 week  No orders of the defined types were placed in this encounter.  Patient Instructions     Increase Basaglar to 14 units tomorrow morning. After tomorrow, increase Basaglar insulin by 2 units every 2 to 3 days until your blood sugar readings are below 200.  Once you have reached that number (below 200) then to stay at that dose.  Continue metformin same dose for now and recheck  with me in 1 week.  Return to the clinic or go to the nearest emergency room if any of your symptoms worsen or new symptoms occur.   If you have lab work done today you will be contacted with your lab results within the next 2 weeks.  If you have not heard from [place] then please contact us. The fastest way to get your results is to register for My Chart.   IF you received an x-ray today, you will receive an invoice from [facility]. Please contact [facility] at [phone] with questions or concerns regarding your invoice.   IF you received labwork today, you will receive an invoice from [facility]. Please contact [facility] at [phone] with questions or concerns regarding your invoice.   Our billing staff will not be able to assist you with questions regarding bills from these companies.  You will be contacted with the lab results as soon as they are available. The fastest way to get your results is to activate your My Chart account. Instructions are located on the last page of this paperwork. If you have not heard from [place] regarding the results in 2 weeks, please contact this office.       I personally performed the services described in this documentation, which was scribed in my presence. The recorded information has been reviewed and considered for accuracy and completeness, addended by me as needed, and agree with information above.  Signed,   [name], MD [facility].  12/14/17 11:09 AM

## 2017-12-15 ENCOUNTER — Other Ambulatory Visit: Payer: Self-pay

## 2017-12-15 ENCOUNTER — Encounter (HOSPITAL_COMMUNITY): Payer: Self-pay | Admitting: Emergency Medicine

## 2017-12-15 ENCOUNTER — Emergency Department (HOSPITAL_COMMUNITY)
Admission: EM | Admit: 2017-12-15 | Discharge: 2017-12-15 | Disposition: A | Discharge status: Home / Self Care | Payer: 59 | Attending: Emergency Medicine | Admitting: Emergency Medicine

## 2017-12-15 DIAGNOSIS — M79651 Pain in right thigh: Secondary | ICD-10-CM | POA: Insufficient documentation

## 2017-12-15 DIAGNOSIS — I10 Essential (primary) hypertension: Secondary | ICD-10-CM | POA: Diagnosis not present

## 2017-12-15 DIAGNOSIS — Z794 Long term (current) use of insulin: Secondary | ICD-10-CM | POA: Diagnosis not present

## 2017-12-15 DIAGNOSIS — F17290 Nicotine dependence, other tobacco product, uncomplicated: Secondary | ICD-10-CM | POA: Insufficient documentation

## 2017-12-15 DIAGNOSIS — Z79899 Other long term (current) drug therapy: Secondary | ICD-10-CM | POA: Insufficient documentation

## 2017-12-15 DIAGNOSIS — M79604 Pain in right leg: Secondary | ICD-10-CM

## 2017-12-15 DIAGNOSIS — M79652 Pain in left thigh: Secondary | ICD-10-CM | POA: Diagnosis not present

## 2017-12-15 DIAGNOSIS — Z86711 Personal history of pulmonary embolism: Secondary | ICD-10-CM | POA: Diagnosis not present

## 2017-12-15 DIAGNOSIS — M79605 Pain in left leg: Secondary | ICD-10-CM

## 2017-12-15 DIAGNOSIS — E119 Type 2 diabetes mellitus without complications: Secondary | ICD-10-CM | POA: Insufficient documentation

## 2017-12-15 DIAGNOSIS — M79659 Pain in unspecified thigh: Secondary | ICD-10-CM | POA: Diagnosis present

## 2017-12-15 LAB — I-STAT CHEM 8, ED
BUN: 13 mg/dL (ref 6–20)
Calcium, Ion: 1.19 mmol/L (ref 1.15–1.40)
Chloride: 98 mmol/L (ref 98–111)
Creatinine, Ser: 0.9 mg/dL (ref 0.61–1.24)
Glucose, Bld: 343 mg/dL — ABNORMAL HIGH (ref 70–99)
HCT: 43 % (ref 39.0–52.0)
Hemoglobin: 14.6 g/dL (ref 13.0–17.0)
Potassium: 4 mmol/L (ref 3.5–5.1)
Sodium: 136 mmol/L (ref 135–145)
TCO2: 28 mmol/L (ref 22–32)

## 2017-12-15 NOTE — ED Triage Notes (Signed)
Pt complaint of bilateral leg cramping onset yesterday; "I've been on blood thinners before for blood clots." Concern for same.

## 2017-12-15 NOTE — ED Provider Notes (Signed)
[facility]-EMERGENCY DEPT Provider Note   CSN: [id number] Arrival date & time: 12/15/17  0807     History   Chief Complaint Chief Complaint  Patient presents with  . Leg Pain    HPI Tarun Patchell Mccarrick is a 47 y.o. male.  47 year old male presents with sudden onset of bilateral thigh cramping which woke him from sleep.  Symptoms last for about 30 minutes and have since resolved.  Denies any use of diuretics.  No leg pain or swelling.  No chest pain or shortness of breath.  No distal numbness or tingling in his feet.  No prior history of same.  Nothing made his symptoms better or worse.     Past Medical History:  Diagnosis Date  . Arthritis   . Diabetes mellitus without complication (HCC)   . Dysrhythmia    unknown type  . GERD (gastroesophageal reflux disease)   . Hypertension    MD stopped BP med 1 yr ago due to normal BP  . Obesity   . Pulmonary embolism, bilateral (HCC) 09/2012   on xarelto  . Shortness of breath    with exertion  . Sleep apnea    uses c pap     Patient Active Problem List   Diagnosis Date Noted  . 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 Abnormal EKG 12/23/2012  . Obesity, Class III, BMI 40-49.9 (morbid obesity) (HCC) 12/11/2012  . SOB (shortness of breath) 09/22/2012  . Pulmonary embolism, bilateral (HCC) 09/22/2012  . Diabetes mellitus without complication (HCC)   . DENTAL PAIN 03/31/2008  . CARPAL TUNNEL SYNDROME, BILATERAL 10/09/2007  . ULNAR NEUROPATHY 10/09/2007  . DENTAL CARIES 10/09/2007  . GERD 10/09/2007  . INGUINAL PAIN, BILATERAL 02/26/2007  . KNEE PAIN, RIGHT 12/19/2006  . FOOT PAIN, BILATERAL 12/19/2006  . HYPERTENSION 09/18/2006  . AVASCULAR NECROSIS 09/18/2006  . SLEEP APNEA 09/18/2006    Past Surgical History:  Procedure Laterality Date  . BREATH TEK H PYLORI N/A 12/28/2012   Procedure: BREATH TEK H PYLORI;  Vannote: Atilano Ina, MD;  Location: [facility];  Service: General;   Laterality: N/A;  . HIP SURGERY Bilateral    to "shave gthe bones"  . LAPAROSCOPIC GASTRIC SLEEVE RESECTION N/A 05/31/2013   Procedure: LAPAROSCOPIC GASTRIC SLEEVE RESECTION;  Stay: Atilano Ina, MD;  Location: [facility];  Service: General;  Laterality: N/A;        Home Medications    Prior to Admission medications   Medication Sig Start Date End Date Taking? Authorizing Provider  AMLODIPINE BESYLATE PO Take 5 mg by mouth daily.    [provider]  glucose blood test strip Up to 3 times per day - DM2 with hyperglycemia. Uncontrolled. 12/04/17   Shade Flood, MD  Insulin Glargine (BASAGLAR KWIKPEN) 100 UNIT/ML SOPN Inject 0.1 mLs (10 Units total) into the skin daily. 12/05/17   Shade Flood, MD  Insulin Pen Needle (PEN NEEDLES) 32G X 4 MM MISC 1 application by Does not apply route daily. 12/04/17   Shade Flood, MD  levocetirizine (XYZAL) 5 MG tablet Take 1 tablet (5 mg total) by mouth every evening. 08/23/17   Worthy Rancher B, FNP  metFORMIN (GLUCOPHAGE) 500 MG tablet Take 1 tablet (500 mg total) by mouth 2 (two) times daily with a meal. 11/30/17 12/30/17  Maxwell Caul, PA-C  metoprolol (LOPRESSOR) 50 MG tablet Take 50 mg by mouth 2 (two) times daily.    04/21/11  [provider]  Family History Family History  Problem Relation Age of Onset  . Cirrhosis Father   . Diabetes Other   . Diabetes Other   . Emphysema Paternal Grandfather        smoked    Social History Social History   Tobacco Use  . Smoking status: Current Every Day Smoker    Packs/day: 1.00    Years: 6.00    Pack years: 6.00    Types: Cigars  . Smokeless tobacco: Never Used  Substance Use Topics  . Alcohol use: Yes    Comment: occasional  . Drug use: No     Allergies   Patient has no known allergies.   Review of Systems Review of Systems  All other systems reviewed and are negative.    Physical Exam Updated Vital Signs Pulse 98   Temp 98.4 F (36.9 C)  (Oral)   Resp 16   SpO2 97%   Physical Exam  Constitutional: He is oriented to person, place, and time. He appears well-developed and well-nourished.  Non-toxic appearance. No distress.  HENT:  Head: Normocephalic and atraumatic.  Eyes: Pupils are equal, round, and reactive to light. Conjunctivae, EOM and lids are normal.  Neck: Normal range of motion. Neck supple. No tracheal deviation present. No thyroid mass present.  Cardiovascular: Normal rate, regular rhythm and normal heart sounds. Exam reveals no gallop.  No murmur heard. Pulmonary/Chest: Effort normal and breath sounds normal. No stridor. No respiratory distress. He has no decreased breath sounds. He has no wheezes. He has no rhonchi. He has no rales.  Abdominal: Soft. Normal appearance and bowel sounds are normal. He exhibits no distension. There is no tenderness. There is no rebound and no CVA tenderness.  Musculoskeletal: Normal range of motion. He exhibits no edema or tenderness.  Bilateral lower extremities with good dorsalis pedis pulses.  Compartments are soft.  No edema noted.  Neurovascular intact at both feet  Neurological: He is alert and oriented to person, place, and time. He has normal strength. No cranial nerve deficit or sensory deficit. GCS eye subscore is 4. GCS verbal subscore is 5. GCS motor subscore is 6.  Skin: Skin is warm and dry. No abrasion and no rash noted.  Psychiatric: He has a normal mood and affect. His speech is normal and behavior is normal.  Nursing note and vitals reviewed.    ED Treatments / Results  Labs (all labs ordered are listed, but only abnormal results are displayed) Labs Reviewed  I-STAT CHEM 8, ED    EKG None  Radiology No results found.  Procedures Procedures (including critical care time)  Medications Ordered in ED Medications - No data to display   Initial Impression / Assessment and Plan / ED Course  I have reviewed the triage vital signs and the nursing  notes.  Pertinent labs & imaging results that were available during my care of the patient were reviewed by me and considered in my medical decision making (see chart for details).     Patient has no signs of acute vascular emergency at this time.  Electrolytes within normal limits with exception of slight hyperglycemia.  Patient stable for discharge  Final Clinical Impressions(s) / ED Diagnoses   Final diagnoses:  None    ED Discharge Orders    None       Lorre Nick, MD 12/15/17 0900

## 2017-12-18 ENCOUNTER — Telehealth: Payer: Self-pay | Admitting: Hematology and Oncology

## 2017-12-18 ENCOUNTER — Encounter: Payer: Self-pay | Admitting: Hematology and Oncology

## 2017-12-18 NOTE — Telephone Encounter (Signed)
Received a hematology referral from Dr. [name] for hx of pe. Pt has been scheduled to see Dr. Caron Presume on 11/26 at 1pm. I sent a message to the referring office to notify the pt.  Letter mailed to the pt.

## 2017-12-19 ENCOUNTER — Ambulatory Visit: Payer: 59 | Admitting: Family Medicine

## 2017-12-30 ENCOUNTER — Other Ambulatory Visit: Payer: Self-pay | Admitting: Family Medicine

## 2017-12-30 ENCOUNTER — Other Ambulatory Visit: Payer: Self-pay | Admitting: General Practice

## 2017-12-30 NOTE — Telephone Encounter (Signed)
Patient called back to check status of refill. Patient states he has three tablets left.

## 2017-12-30 NOTE — Telephone Encounter (Signed)
Copied from CRM [id number]. Topic: Quick Communication - Rx Refill/Question >> Dec 30, 2017  9:35 AM Herby AbrahamJohnson, Shiquita C wrote: Medication: metFORMIN (GLUCOPHAGE) 500 MG tablet   Has the patient contacted their pharmacy? Yes   (Agent: If no, request that the patient contact the pharmacy for the refill.) (Agent: If yes, when and what did the pharmacy advise?)  Preferred Pharmacy (with phone number or street name): [id number] - [place], [place] - [address] [phone] (Phone) [phone] (Fax)    Agent: Please be advised that RX refills may take up to 3 business days. We ask that you follow-up with your pharmacy.

## 2017-12-30 NOTE — Telephone Encounter (Signed)
Copied from CRM [phone]. Topic: Quick Communication - Rx Refill/Question >> Dec 30, 2017  1:56 PM Angela NevinWilliams, Candice N wrote: Medication: amLODipine (NORVASC) 5 MG tablet   Patient is requesting a refill of this medication.   Preferred Pharmacy (with phone number or street name):[id number] - [place], [place] - [address]

## 2017-12-31 ENCOUNTER — Encounter: Payer: Self-pay | Admitting: Hematology and Oncology

## 2017-12-31 ENCOUNTER — Telehealth: Payer: Self-pay | Admitting: Hematology and Oncology

## 2017-12-31 NOTE — Telephone Encounter (Signed)
Pt's new hem appt has been r/s to 12/5 at 1pm to see Dr. Caron Presumeuben.  New letter mailed to the pt.

## 2017-12-31 NOTE — Telephone Encounter (Signed)
Requested medication (s) are due for refill today: yes  Requested medication (s) are on the active medication list: yes    Last refill: 11/30/17  #60  0 refills  Future visit scheduled yes 01/05/18  Dr. Carlota Raspberry  Notes to clinic:Historical provider  Requested Prescriptions  Pending Prescriptions Disp Refills   metFORMIN (GLUCOPHAGE) 500 MG tablet 60 tablet 0    Sig: Take 1 tablet (500 mg total) by mouth 2 (two) times daily with a meal.     Endocrinology:  Diabetes - Biguanides Failed - 12/31/2017 11:27 AM      Failed - HBA1C is between 0 and 7.9 and within 180 days    Hemoglobin A1C  Date Value Ref Range Status  12/04/2017 12.8 (A) 4.0 - 5.6 % Final   Hgb A1c MFr Bld  Date Value Ref Range Status  12/14/2012 6.4 (H) <5.7 % Final    Comment:                                                                           According to the ADA Clinical Practice Recommendations for 2011, when HbA1c is used as a screening test:     >=6.5%   Diagnostic of Diabetes Mellitus            (if abnormal result is confirmed)   5.7-6.4%   Increased risk of developing Diabetes Mellitus   References:Diagnosis and Classification of Diabetes Mellitus,Diabetes ZPHX,5056,97(XYIAX 1):S62-S69 and Standards of Medical Care in         Diabetes - 2011,Diabetes Care,2011,34 (Suppl 1):S11-S61.           Passed - Cr in normal range and within 360 days    Creat  Date Value Ref Range Status  12/14/2012 1.08 0.50 - 1.35 mg/dL Final   Creatinine, Ser  Date Value Ref Range Status  12/15/2017 0.90 0.61 - 1.24 mg/dL Final         Passed - eGFR in normal range and within 360 days    GFR calc Af Amer  Date Value Ref Range Status  12/04/2017 77 >59 mL/min/1.73 Final   GFR calc non Af Amer  Date Value Ref Range Status  12/04/2017 67 >59 mL/min/1.73 Final         Passed - Valid encounter within last 6 months    Recent Outpatient Visits          2 weeks ago Type 2 diabetes mellitus with hyperglycemia,  with long-term current use of insulin [facility])   [facility] at [place], MD   3 weeks ago Type 2 diabetes mellitus with hyperglycemia, without long-term current use of insulin [facility] [place])   [facility] [place], Ranell Patrick, MD      Future Appointments            In 5 days Wendie Agreste, MD [facility] at [place], [facility]

## 2018-01-01 MED ORDER — AMLODIPINE BESYLATE 5 MG PO TABS: 5.0000 mg | ORAL_TABLET | Freq: Every day | ORAL | 1 refills | Status: DC

## 2018-01-01 MED ORDER — METFORMIN HCL 500 MG PO TABS: 500.0000 mg | ORAL_TABLET | Freq: Two times a day (BID) | ORAL | 0 refills | Status: DC

## 2018-01-01 NOTE — Telephone Encounter (Signed)
Requested medication (s) are due for refill today: due 01/14/18  Requested medication (s) are on the active medication list: yes    Last refill: 12/15/17  Future visit scheduled yes 01/05/18  Dr. Neva SeatGreene   Notes to clinic:Historical provider  Requested Prescriptions  Pending Prescriptions Disp Refills   amLODipine (NORVASC) 5 MG tablet      Sig: Take 1 tablet (5 mg total) by mouth daily.     Cardiovascular:  Calcium Channel Blockers Passed - 12/31/2017  3:26 PM      Passed - Last BP in normal range    BP Readings from Last 1 Encounters:  12/12/17 129/83         Passed - Valid encounter within last 6 months    Recent Outpatient Visits          2 weeks ago Type 2 diabetes mellitus with hyperglycemia, with long-term current use of insulin [facility](HCC)   [facility] [place], Asencion PartridgeJeffrey R, MD   4 weeks ago Type 2 diabetes mellitus with hyperglycemia, without long-term current use of insulin [facility](HCC)   [facility] [place], Asencion PartridgeJeffrey R, MD      Future Appointments            In 4 days Shade FloodGreene, Jeffrey R, MD [facility] [place], [facility]

## 2018-01-01 NOTE — Telephone Encounter (Signed)
Patient is requesting a refill of the following medications: Requested Prescriptions   Pending Prescriptions Disp Refills  . metFORMIN (GLUCOPHAGE) 500 MG tablet 60 tablet 0    Sig: Take 1 tablet (500 mg total) by mouth 2 (two) times daily with a meal.    Date of patient request: 12/30/2017 Last office visit: 12/12/2017 Date of last refill: 11/30/2017 Last refill amount: 60 Follow up time period per chart:

## 2018-01-01 NOTE — Telephone Encounter (Signed)
Refilled

## 2018-01-05 ENCOUNTER — Ambulatory Visit: Payer: 59 | Admitting: Family Medicine

## 2018-01-06 ENCOUNTER — Encounter: Payer: Self-pay | Admitting: Hematology and Oncology

## 2018-01-07 ENCOUNTER — Encounter: Payer: Self-pay | Admitting: Family Medicine

## 2018-01-07 ENCOUNTER — Ambulatory Visit (INDEPENDENT_AMBULATORY_CARE_PROVIDER_SITE_OTHER): Payer: 59 | Admitting: Family Medicine

## 2018-01-07 ENCOUNTER — Other Ambulatory Visit: Payer: Self-pay

## 2018-01-07 VITALS — BP 133/89 | HR 76 | Temp 98.3°F | Resp 20 | Ht 72.84 in | Wt 353.6 lb

## 2018-01-07 DIAGNOSIS — E1165 Type 2 diabetes mellitus with hyperglycemia: Secondary | ICD-10-CM

## 2018-01-07 DIAGNOSIS — Z13220 Encounter for screening for lipoid disorders: Secondary | ICD-10-CM

## 2018-01-07 DIAGNOSIS — Z794 Long term (current) use of insulin: Secondary | ICD-10-CM

## 2018-01-07 LAB — GLUCOSE, POCT (MANUAL RESULT ENTRY): POC Glucose: 130 mg/dl — AB (ref 70–99)

## 2018-01-07 MED ORDER — METFORMIN HCL 1000 MG PO TABS: 1000.0000 mg | ORAL_TABLET | Freq: Two times a day (BID) | ORAL | 1 refills | Status: DC

## 2018-01-07 NOTE — Progress Notes (Addendum)
Subjective:  By signing my name below, I, Phillip Frye, attest that this documentation has been prepared under the direction and in the presence of Phillip Ray, MD. Electronically Signed: [name], [place]. 01/07/2018 , 10:52 AM .  Patient was seen in Room 12 .   Patient ID: [name], male    DOB: 18-Jan-1971, 47 y.o.   MRN: [id number] Chief Complaint  Patient presents with  . Diabetes   HPI Phillip Frye is a 47 y.o. male  Here for diabetes follow up. Patient was last seen on Nov 1st, see prior notes. He has had a history of diabetes but had been non adherent to medications. He was restarted on Basaglar late Oct and continued 500 mg bid. He was increased to Basaglar 14 units last visit with 2 unit increase every 2-3 days until reading below 200. He is fasting this morning.   Diabetes Lab Results  Component Value Date   HGBA1C 12.8 (A) 12/04/2017   Patient states he's up to Basaglar 50 units, with lowest sugar this morning at 157. He's still been taking metformin 500 mg bid. He's still taking insulin the morning, but did change to take it last night. His sugars have been normally staying in 200s, and sometimes 300 after he eats. He denies any abdominal pain or diarrhea. He hasn't taken any metformin this morning. He denies any chest pain, shortness of breath, lightheadedness, dizziness, headache, abdominal pain, Frye in stool or dark tarry stools. Last renal function looked okay with creatinine of 1.26.   Foot exam: 12/04/17 Micro albumin: normal 12/04/17  Optho: referred to optho   Lipid panel Lab Results  Component Value Date   CHOL 104 12/14/2012   HDL 30 (L) 12/14/2012   LDLCALC 59 12/14/2012   TRIG 76 12/14/2012   CHOLHDL 3.5 12/14/2012   Lab Results  Component Value Date   ALT 34 08/12/2017   AST 30 08/12/2017   ALKPHOS 77 08/12/2017   BILITOT 0.1 (L) 08/12/2017    Patient Active Problem List   Diagnosis Date Noted  . 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 Abnormal EKG 12/23/2012  . Obesity, Class III, BMI 40-49.9 (morbid obesity) ([place]) 12/11/2012  . SOB (shortness of breath) 09/22/2012  . Pulmonary embolism, bilateral ([place]) 09/22/2012  . Diabetes mellitus without complication ([place])   . DENTAL PAIN 03/31/2008  . CARPAL TUNNEL SYNDROME, BILATERAL 10/09/2007  . ULNAR NEUROPATHY 10/09/2007  . DENTAL CARIES 10/09/2007  . GERD 10/09/2007  . INGUINAL PAIN, BILATERAL 02/26/2007  . KNEE PAIN, RIGHT 12/19/2006  . FOOT PAIN, BILATERAL 12/19/2006  . HYPERTENSION 09/18/2006  . AVASCULAR NECROSIS 09/18/2006  . SLEEP APNEA 09/18/2006   Past Medical History:  Diagnosis Date  . Arthritis   . Diabetes mellitus without complication ([place])   . Dysrhythmia    unknown type  . GERD (gastroesophageal reflux disease)   . Hypertension    MD stopped BP med 1 yr ago due to normal BP  . Obesity   . Pulmonary embolism, bilateral ([place]) 09/2012   on xarelto  . Shortness of breath    with exertion  . Sleep apnea    uses c pap    Past Surgical History:  Procedure Laterality Date  . BREATH TEK H PYLORI N/A 12/28/2012   Procedure: BREATH TEK H PYLORI;  Vorndran: Phillip Curry, MD;  Location: [facility];  Service: General;  Laterality: N/A;  . HIP SURGERY Bilateral    to "shave gthe bones"  .  LAPAROSCOPIC GASTRIC SLEEVE RESECTION N/A 05/31/2013   Procedure: LAPAROSCOPIC GASTRIC SLEEVE RESECTION;  Phillip Frye: Phillip Curry, MD;  Location: [facility];  Service: General;  Laterality: N/A;   No Known Allergies Prior to Admission medications   Medication Sig Start Date End Date Taking? Authorizing Provider  amLODipine (NORVASC) 5 MG tablet Take 1 tablet (5 mg total) by mouth daily. 01/01/18   Wendie Agreste, MD  glucose Frye test strip Up to 3 times per day - DM2 with hyperglycemia. Uncontrolled. 12/04/17   Wendie Agreste, MD  Insulin Glargine (BASAGLAR KWIKPEN) 100 UNIT/ML SOPN Inject 0.1 mLs (10 Units total) into the skin  daily. Patient taking differently: Inject 16 Units into the skin daily.  12/05/17   Wendie Agreste, MD  Insulin Pen Needle (PEN NEEDLES) 32G X 4 MM MISC 1 application by Does not apply route daily. 12/04/17   Wendie Agreste, MD  levocetirizine (XYZAL) 5 MG tablet Take 1 tablet (5 mg total) by mouth every evening. Patient not taking: Reported on 12/15/2017 08/23/17   Dutch Quint B, FNP  metFORMIN (GLUCOPHAGE) 500 MG tablet Take 1 tablet (500 mg total) by mouth 2 (two) times daily with a meal. 01/01/18 01/31/18  Wendie Agreste, MD  metoprolol (LOPRESSOR) 50 MG tablet Take 50 mg by mouth 2 (two) times daily.    04/21/11  [provider]   Social History   Socioeconomic History  . Marital status: Single    Spouse name: Not on file  . Number of children: 0  . Years of education: Not on file  . Highest education level: Not on file  Occupational History  . Not on file  Social Needs  . Financial resource strain: Not on file  . Food insecurity:    Worry: Not on file    Inability: Not on file  . Transportation needs:    Medical: Not on file    Non-medical: Not on file  Tobacco Use  . Smoking status: Current Every Day Smoker    Packs/day: 1.00    Years: 6.00    Pack years: 6.00    Types: Cigars  . Smokeless tobacco: Never Used  Substance and Sexual Activity  . Alcohol use: Yes    Comment: occasional  . Drug use: No  . Sexual activity: Not on file  Lifestyle  . Physical activity:    Days per week: Not on file    Minutes per session: Not on file  . Stress: Not on file  Relationships  . Social connections:    Talks on phone: Not on file    Gets together: Not on file    Attends religious service: Not on file    Active member of club or organization: Not on file    Attends meetings of clubs or organizations: Not on file    Relationship status: Not on file  . Intimate partner violence:    Fear of current or ex partner: Not on file    Emotionally abused: Not on  file    Physically abused: Not on file    Forced sexual activity: Not on file  Other Topics Concern  . Not on file  Social History Narrative   Lives alone.  Works at [employer].   Review of Systems  Constitutional: Negative for fatigue and unexpected weight change.  Eyes: Negative for visual disturbance.  Respiratory: Negative for cough, chest tightness and shortness of breath.   Cardiovascular: Negative for chest pain, palpitations and leg  swelling.  Gastrointestinal: Negative for abdominal pain, Frye in stool, diarrhea, nausea and vomiting.  Neurological: Negative for dizziness, light-headedness and headaches.       Objective:   Physical Exam  Constitutional: He is oriented to person, place, and time. He appears well-developed and well-nourished.  HENT:  Head: Normocephalic and atraumatic.  Eyes: Pupils are equal, round, and reactive to light. EOM are normal.  Neck: No JVD present. Carotid bruit is not present.  Cardiovascular: Normal rate, regular rhythm and normal heart sounds.  No murmur heard. Pulmonary/Chest: Effort normal and breath sounds normal. He has no rales.  Musculoskeletal: He exhibits no edema.  Neurological: He is alert and oriented to person, place, and time.  Skin: Skin is warm and dry.  Psychiatric: He has a normal mood and affect.  Vitals reviewed.   Vitals:   01/07/18 1020  BP: 133/89  Pulse: 76  Resp: 20  Temp: 98.3 F (36.8 C)  TempSrc: Oral  SpO2: 96%  Weight: (!) 353 lb 9.6 oz (160.4 kg)  Height: 6' 0.84" (1.85 m)   Results for orders placed or performed in visit on 01/07/18  POCT glucose (manual entry)  Result Value Ref Range   POC Glucose 130 (A) 70 - 99 mg/dl       Assessment & Plan:    Daeshawn Redmann Kasik is a 47 y.o. male Type 2 diabetes mellitus with hyperglycemia, with long-term current use of insulin ([place]) - Plan: Ambulatory referral to Ophthalmology, POCT glucose (manual entry), metFORMIN (GLUCOPHAGE) 1000 MG  tablet  Screening for hyperlipidemia - Plan: Lipid panel, Comprehensive metabolic panel  -Commended on improved readings.  No true hypoglycemic readings but based on prior levels when he is in the 100s may feel some symptoms. Hypoglycemic precautions and handout given.   - Okay to continue same dose Lantus for now, importance of regular meals discussed with taking insulin, and increase metformin to 1059m twice daily.  Recheck in 1 month  Meds ordered this encounter  Medications  . metFORMIN (GLUCOPHAGE) 1000 MG tablet    Sig: Take 1 tablet (1,000 mg total) by mouth 2 (two) times daily with a meal.    Dispense:  180 tablet    Refill:  1   Patient Instructions   Increase metformin to 10042mtwice per day. Stay at same dose insulin for now and recheck in 1 month.   See low Frye sugar precuations below. If that happens, let me know.    Hypoglycemia Hypoglycemia is when the sugar (glucose) level in the Frye is too low. Symptoms of low Frye sugar may include:  Feeling: ? Hungry. ? Worried or nervous (anxious). ? Sweaty and clammy. ? Confused. ? Dizzy. ? Sleepy. ? Sick to your stomach (nauseous).  Having: ? A fast heartbeat. ? A headache. ? A change in your vision. ? Jerky movements that you cannot control (seizure). ? Nightmares. ? Tingling or no feeling (numbness) around the mouth, lips, or tongue.  Having trouble with: ? Talking. ? Paying attention (concentrating). ? Moving (coordination). ? Sleeping.  Shaking.  Passing out (fainting).  Getting upset easily (irritability).  Low Frye sugar can happen to people who have diabetes and people who do not have diabetes. Low Frye sugar can happen quickly, and it can be an emergency. Treating Low Frye Sugar Low Frye sugar is often treated by eating or drinking something sugary right away. If you can think clearly and swallow safely, follow the 15:15 rule:  Take 15 grams of a fast-acting  carb (carbohydrate). Some  fast-acting carbs are: ? 1 tube of glucose gel. ? 3 sugar tablets (glucose pills). ? 6-8 pieces of hard candy. ? 4 oz (120 mL) of fruit juice. ? 4 oz (120 mL) of regular (not diet) soda.  Check your Frye sugar 15 minutes after you take the carb.  If your Frye sugar is still at or below 70 mg/dL (3.9 mmol/L), take 15 grams of a carb again.  If your Frye sugar does not go above 70 mg/dL (3.9 mmol/L) after 3 tries, get help right away.  After your Frye sugar goes back to normal, eat a meal or a snack within 1 hour.  Treating Very Low Frye Sugar If your Frye sugar is at or below 54 mg/dL (3 mmol/L), you have very low Frye sugar (severe hypoglycemia). This is an emergency. Do not wait to see if the symptoms will go away. Get medical help right away. Call your local emergency services (911 in the [place] Do not drive yourself to the hospital. If you have very low Frye sugar and you cannot eat or drink, you may need a glucagon shot (injection). A family member or friend should learn how to check your Frye sugar and how to give you a glucagon shot. Ask your doctor if you need to have a glucagon shot kit at home. Follow these instructions at home: General instructions  Avoid any diets that cause you to not eat enough food. Talk with your doctor before you start any new diet.  Take over-the-counter and prescription medicines only as told by your doctor.  Limit alcohol to no more than 1 drink per day for nonpregnant women and 2 drinks per day for men. One drink equals 12 oz of beer, 5 oz of wine, or 1 oz of hard liquor.  Keep all follow-up visits as told by your doctor. This is important. If You Have Diabetes:   Make sure you know the symptoms of low Frye sugar.  Always keep a source of sugar with you, such as: ? Sugar. ? Sugar tablets. ? Glucose gel. ? Fruit juice. ? Regular soda (not diet soda). ? Milk. ? Hard candy. ? Honey.  Take your medicines as told.  Follow your  exercise and meal plan. ? Eat on time. Do not skip meals. ? Follow your sick day plan when you cannot eat or drink normally. Make this plan ahead of time with your doctor.  Check your Frye sugar as often as told by your doctor. Always check before and after exercise.  Share your diabetes care plan with: ? Your work or school. ? People you live with.  Check your pee (urine) for ketones: ? When you are sick. ? As told by your doctor.  Carry a card or wear jewelry that says you have diabetes. If You Have Low Frye Sugar From Other Causes:   Check your Frye sugar as often as told by your doctor.  Follow instructions from your doctor about what you cannot eat or drink. Contact a doctor if:  You have trouble keeping your Frye sugar in your target range.  You have low Frye sugar often. Get help right away if:  You still have symptoms after you eat or drink something sugary.  Your Frye sugar is at or below 54 mg/dL (3 mmol/L).  You have jerky movements that you cannot control.  You pass out. These symptoms may be an emergency. Do not wait to see if the symptoms will go away.  Get medical help right away. Call your local emergency services (911 in the [place] Do not drive yourself to the hospital. This information is not intended to replace advice given to you by your health care provider. Make sure you discuss any questions you have with your health care provider. Document Released: 04/24/2009 Document Revised: 07/06/2015 Document Reviewed: 03/03/2015 Elsevier Interactive Patient Education  [employer].    If you have lab work done today you will be contacted with your lab results within the next 2 weeks.  If you have not heard from [place] then please contact us. The fastest way to get your results is to register for My Chart.   IF you received an x-Frye today, you will receive an invoice from [facility]. Please contact [facility] at [phone] with  questions or concerns regarding your invoice.   IF you received labwork today, you will receive an invoice from [facility]. Please contact [facility] at [phone] with questions or concerns regarding your invoice.   Our billing staff will not be able to assist you with questions regarding bills from these companies.  You will be contacted with the lab results as soon as they are available. The fastest way to get your results is to activate your My Chart account. Instructions are located on the last page of this paperwork. If you have not heard from [place] regarding the results in 2 weeks, please contact this office.      I personally performed the services described in this documentation, which was scribed in my presence. The recorded information has been reviewed and considered for accuracy and completeness, addended by me as needed, and agree with information above.  Signed,   Phillip Ray, MD [facility].  01/07/18 3:03 PM

## 2018-01-07 NOTE — Patient Instructions (Addendum)
Increase metformin to 1000mg twice per day. Stay at same dose insulin for now and recheck in 1 month.   See low blood sugar precuations below. If that happens, let me know.    Hypoglycemia Hypoglycemia is when the sugar (glucose) level in the blood is too low. Symptoms of low blood sugar may include:  Feeling: ? Hungry. ? Worried or nervous (anxious). ? Sweaty and clammy. ? Confused. ? Dizzy. ? Sleepy. ? Sick to your stomach (nauseous).  Having: ? A fast heartbeat. ? A headache. ? A change in your vision. ? Jerky movements that you cannot control (seizure). ? Nightmares. ? Tingling or no feeling (numbness) around the mouth, lips, or tongue.  Having trouble with: ? Talking. ? Paying attention (concentrating). ? Moving (coordination). ? Sleeping.  Shaking.  Passing out (fainting).  Getting upset easily (irritability).  Low blood sugar can happen to people who have diabetes and people who do not have diabetes. Low blood sugar can happen quickly, and it can be an emergency. Treating Low Blood Sugar Low blood sugar is often treated by eating or drinking something sugary right away. If you can think clearly and swallow safely, follow the 15:15 rule:  Take 15 grams of a fast-acting carb (carbohydrate). Some fast-acting carbs are: ? 1 tube of glucose gel. ? 3 sugar tablets (glucose pills). ? 6-8 pieces of hard candy. ? 4 oz (120 mL) of fruit juice. ? 4 oz (120 mL) of regular (not diet) soda.  Check your blood sugar 15 minutes after you take the carb.  If your blood sugar is still at or below 70 mg/dL (3.9 mmol/L), take 15 grams of a carb again.  If your blood sugar does not go above 70 mg/dL (3.9 mmol/L) after 3 tries, get help right away.  After your blood sugar goes back to normal, eat a meal or a snack within 1 hour.  Treating Very Low Blood Sugar If your blood sugar is at or below 54 mg/dL (3 mmol/L), you have very low blood sugar (severe hypoglycemia). This is an  emergency. Do not wait to see if the symptoms will go away. Get medical help right away. Call your local emergency services (911 in the [place] Do not drive yourself to the hospital. If you have very low blood sugar and you cannot eat or drink, you may need a glucagon shot (injection). A family member or friend should learn how to check your blood sugar and how to give you a glucagon shot. Ask your doctor if you need to have a glucagon shot kit at home. Follow these instructions at home: General instructions  Avoid any diets that cause you to not eat enough food. Talk with your doctor before you start any new diet.  Take over-the-counter and prescription medicines only as told by your doctor.  Limit alcohol to no more than 1 drink per day for nonpregnant women and 2 drinks per day for men. One drink equals 12 oz of beer, 5 oz of wine, or 1 oz of hard liquor.  Keep all follow-up visits as told by your doctor. This is important. If You Have Diabetes:   Make sure you know the symptoms of low blood sugar.  Always keep a source of sugar with you, such as: ? Sugar. ? Sugar tablets. ? Glucose gel. ? Fruit juice. ? Regular soda (not diet soda). ? Milk. ? Hard candy. ? Honey.  Take your medicines as told.  Follow your exercise and meal plan. ?   Eat on time. Do not skip meals. ? Follow your sick day plan when you cannot eat or drink normally. Make this plan ahead of time with your doctor.  Check your blood sugar as often as told by your doctor. Always check before and after exercise.  Share your diabetes care plan with: ? Your work or school. ? People you live with.  Check your pee (urine) for ketones: ? When you are sick. ? As told by your doctor.  Carry a card or wear jewelry that says you have diabetes. If You Have Low Blood Sugar From Other Causes:   Check your blood sugar as often as told by your doctor.  Follow instructions from your doctor about what you cannot eat or  drink. Contact a doctor if:  You have trouble keeping your blood sugar in your target range.  You have low blood sugar often. Get help right away if:  You still have symptoms after you eat or drink something sugary.  Your blood sugar is at or below 54 mg/dL (3 mmol/L).  You have jerky movements that you cannot control.  You pass out. These symptoms may be an emergency. Do not wait to see if the symptoms will go away. Get medical help right away. Call your local emergency services (911 in the [place] Do not drive yourself to the hospital. This information is not intended to replace advice given to you by your health care provider. Make sure you discuss any questions you have with your health care provider. Document Released: 04/24/2009 Document Revised: 07/06/2015 Document Reviewed: 03/03/2015 Elsevier Interactive Patient Education  2018 Elsevier Inc.    If you have lab work done today you will be contacted with your lab results within the next 2 weeks.  If you have not heard from us then please contact us. The fastest way to get your results is to register for My Chart.   IF you received an x-ray today, you will receive an invoice from [facility]. Please contact [facility] at [phone] with questions or concerns regarding your invoice.   IF you received labwork today, you will receive an invoice from [facility]. Please contact [facility] at [phone] with questions or concerns regarding your invoice.   Our billing staff will not be able to assist you with questions regarding bills from these companies.  You will be contacted with the lab results as soon as they are available. The fastest way to get your results is to activate your My Chart account. Instructions are located on the last page of this paperwork. If you have not heard from us regarding the results in 2 weeks, please contact this office.     

## 2018-01-08 LAB — COMPREHENSIVE METABOLIC PANEL
ALT: 24 IU/L (ref 0–44)
AST: 15 IU/L (ref 0–40)
Albumin/Globulin Ratio: 1.7 (ref 1.2–2.2)
Albumin: 4.3 g/dL (ref 3.5–5.5)
Alkaline Phosphatase: 86 IU/L (ref 39–117)
BUN/Creatinine Ratio: 7 — ABNORMAL LOW (ref 9–20)
BUN: 7 mg/dL (ref 6–24)
Bilirubin Total: 0.4 mg/dL (ref 0.0–1.2)
CO2: 23 mmol/L (ref 20–29)
Calcium: 9.7 mg/dL (ref 8.7–10.2)
Chloride: 103 mmol/L (ref 96–106)
Creatinine, Ser: 0.95 mg/dL (ref 0.76–1.27)
GFR calc Af Amer: 108 mL/min/{1.73_m2} (ref >59)
GFR calc non Af Amer: 94 mL/min/{1.73_m2} (ref >59)
Globulin, Total: 2.6 g/dL (ref 1.5–4.5)
Glucose: 128 mg/dL — ABNORMAL HIGH (ref 65–99)
Potassium: 4.5 mmol/L (ref 3.5–5.2)
Sodium: 142 mmol/L (ref 134–144)
Total Protein: 6.9 g/dL (ref 6.0–8.5)

## 2018-01-08 LAB — LIPID PANEL
Chol/HDL Ratio: 3.7 ratio (ref 0.0–5.0)
Cholesterol, Total: 125 mg/dL (ref 100–199)
HDL: 34 mg/dL — ABNORMAL LOW (ref >39)
LDL Calculated: 72 mg/dL (ref 0–99)
Triglycerides: 97 mg/dL (ref 0–149)
VLDL Cholesterol Cal: 19 mg/dL (ref 5–40)

## 2018-01-12 ENCOUNTER — Telehealth: Payer: Self-pay | Admitting: Family Medicine

## 2018-01-12 DIAGNOSIS — E1165 Type 2 diabetes mellitus with hyperglycemia: Secondary | ICD-10-CM

## 2018-01-12 NOTE — Telephone Encounter (Signed)
Copied from CRM [phone]. Topic: General - Other >> Jan 12, 2018 12:15 PM Percival SpanishKennedy, Cheryl W wrote:  Pt changed pharmacy and will need nex RX    Pt call to say he need a machine to test his sugar and test strips will a new RX    [facility]

## 2018-01-14 NOTE — Telephone Encounter (Signed)
Pt is requesting prescription for new blood sugar meter and test strips.

## 2018-01-14 NOTE — Telephone Encounter (Signed)
Ok to send in new rx for meter and testing strips, but please verify which one is covered by his insurance. Thanks.

## 2018-01-14 NOTE — Telephone Encounter (Signed)
Copied from CRM [phone]. Topic: Quick Communication - Rx Refill/Question >> Jan 14, 2018  9:07 AM Wyonia Frye, Phillip E wrote: Medication: Insulin Pen Needle (PEN NEEDLES) 32G X 4 MM MISC  Has the patient contacted their pharmacy? No.( requested that the patient contact the pharmacy to check for the refill.) Pt changed his pharmacy. Pt stated he called and put request in on Monday 12.02.2019   Preferred Pharmacy (with phone number or street name): [facility] [place] - [address] [phone] (Phone) [phone] (Fax)

## 2018-01-14 NOTE — Telephone Encounter (Signed)
Please advise 

## 2018-01-14 NOTE — Telephone Encounter (Signed)
The One touch is the one the insurance will cover. Patient also would like a 30 day supply of the insulin he was only give 12 day supply

## 2018-01-15 ENCOUNTER — Encounter: Payer: Self-pay | Admitting: Hematology and Oncology

## 2018-01-15 ENCOUNTER — Telehealth: Payer: Self-pay | Admitting: *Deleted

## 2018-01-15 ENCOUNTER — Inpatient Hospital Stay: Payer: 59 | Attending: Hematology and Oncology | Admitting: Hematology and Oncology

## 2018-01-15 ENCOUNTER — Inpatient Hospital Stay: Payer: 59

## 2018-01-15 VITALS — BP 138/78 | HR 99 | Temp 98.8°F | Resp 19 | Ht 72.84 in | Wt 355.0 lb

## 2018-01-15 DIAGNOSIS — M19071 Primary osteoarthritis, right ankle and foot: Secondary | ICD-10-CM | POA: Insufficient documentation

## 2018-01-15 DIAGNOSIS — D6859 Other primary thrombophilia: Secondary | ICD-10-CM

## 2018-01-15 DIAGNOSIS — E1142 Type 2 diabetes mellitus with diabetic polyneuropathy: Secondary | ICD-10-CM | POA: Insufficient documentation

## 2018-01-15 DIAGNOSIS — Z720 Tobacco use: Secondary | ICD-10-CM | POA: Insufficient documentation

## 2018-01-15 DIAGNOSIS — Z9119 Patient's noncompliance with other medical treatment and regimen: Secondary | ICD-10-CM | POA: Insufficient documentation

## 2018-01-15 DIAGNOSIS — K219 Gastro-esophageal reflux disease without esophagitis: Secondary | ICD-10-CM | POA: Insufficient documentation

## 2018-01-15 DIAGNOSIS — M19072 Primary osteoarthritis, left ankle and foot: Secondary | ICD-10-CM | POA: Insufficient documentation

## 2018-01-15 DIAGNOSIS — Z794 Long term (current) use of insulin: Secondary | ICD-10-CM | POA: Insufficient documentation

## 2018-01-15 DIAGNOSIS — I82492 Acute embolism and thrombosis of other specified deep vein of left lower extremity: Secondary | ICD-10-CM

## 2018-01-15 DIAGNOSIS — Z86711 Personal history of pulmonary embolism: Secondary | ICD-10-CM | POA: Insufficient documentation

## 2018-01-15 DIAGNOSIS — I2694 Multiple subsegmental pulmonary emboli without acute cor pulmonale: Secondary | ICD-10-CM

## 2018-01-15 DIAGNOSIS — R0609 Other forms of dyspnea: Secondary | ICD-10-CM

## 2018-01-15 DIAGNOSIS — I10 Essential (primary) hypertension: Secondary | ICD-10-CM | POA: Diagnosis not present

## 2018-01-15 DIAGNOSIS — G4733 Obstructive sleep apnea (adult) (pediatric): Secondary | ICD-10-CM | POA: Insufficient documentation

## 2018-01-15 DIAGNOSIS — Z809 Family history of malignant neoplasm, unspecified: Secondary | ICD-10-CM

## 2018-01-15 DIAGNOSIS — Z808 Family history of malignant neoplasm of other organs or systems: Secondary | ICD-10-CM

## 2018-01-15 DIAGNOSIS — I2699 Other pulmonary embolism without acute cor pulmonale: Secondary | ICD-10-CM | POA: Insufficient documentation

## 2018-01-15 DIAGNOSIS — Z7982 Long term (current) use of aspirin: Secondary | ICD-10-CM | POA: Insufficient documentation

## 2018-01-15 LAB — CBC WITH DIFFERENTIAL (CANCER CENTER ONLY)
Abs Immature Granulocytes: 0.01 10*3/uL (ref 0.00–0.07)
Basophils Absolute: 0 10*3/uL (ref 0.0–0.1)
Basophils Relative: 0 %
Eosinophils Absolute: 0 10*3/uL (ref 0.0–0.5)
Eosinophils Relative: 1 %
HCT: 42.9 % (ref 39.0–52.0)
Hemoglobin: 13.9 g/dL (ref 13.0–17.0)
Immature Granulocytes: 0 %
Lymphocytes Relative: 41 %
Lymphs Abs: 2.7 10*3/uL (ref 0.7–4.0)
MCH: 30.2 pg (ref 26.0–34.0)
MCHC: 32.4 g/dL (ref 30.0–36.0)
MCV: 93.1 fL (ref 80.0–100.0)
Monocytes Absolute: 0.6 10*3/uL (ref 0.1–1.0)
Monocytes Relative: 9 %
Neutro Abs: 3.2 10*3/uL (ref 1.7–7.7)
Neutrophils Relative %: 49 %
Platelet Count: 274 10*3/uL (ref 150–400)
RBC: 4.61 MIL/uL (ref 4.22–5.81)
RDW: 12.2 % (ref 11.5–15.5)
WBC Count: 6.6 10*3/uL (ref 4.0–10.5)
nRBC: 0 % (ref 0.0–0.2)

## 2018-01-15 LAB — ANTITHROMBIN III: AntiThromb III Func: 101 % (ref 75–120)

## 2018-01-15 LAB — D-DIMER, QUANTITATIVE: D-Dimer, Quant: 0.77 ug/mL-FEU — ABNORMAL HIGH (ref 0.00–0.50)

## 2018-01-15 MED ORDER — BASAGLAR KWIKPEN 100 UNIT/ML ~~LOC~~ SOPN: 50.0000 [IU] | PEN_INJECTOR | Freq: Every day | SUBCUTANEOUS | 2 refills | Status: DC

## 2018-01-15 MED ORDER — BLOOD GLUCOSE METER KIT: PACK | 0 refills | Status: DC

## 2018-01-15 MED FILL — FREESTYLE LITE METER: 20 days supply | Qty: 1 | Fill #0

## 2018-01-15 MED FILL — FREESTYLE LANCETS: 25 days supply | Qty: 100 | Fill #0

## 2018-01-15 MED FILL — FREESTYLE LITE TEST STRIP: 25 days supply | Qty: 100 | Fill #0

## 2018-01-15 NOTE — Telephone Encounter (Signed)
Both requests completed. Thanks.

## 2018-01-15 NOTE — Patient Instructions (Signed)
We discussed in detail the results of your previous hypercoagulable work-up while hospitalized in 2014.  Your prothrombin gene mutation, factor V Leiden gene mutation, and lupus anticoagulant were normal.  Because you are no longer on anticoagulation, laboratory studies will be done today to determine if you have a hypercoagulable predisposition.  Barring any unforeseen complications, your scheduled doctor visit to discuss those results is on December 20.  Please do not hesitate to call should any new or untoward problems arise in the interim.  Wishing you a very Alt[place] Cabal[place]erry Christmas! Happy New Year!

## 2018-01-15 NOTE — Telephone Encounter (Signed)
Prescription faxed successfully

## 2018-01-16 ENCOUNTER — Telehealth: Payer: Self-pay | Admitting: Hematology and Oncology

## 2018-01-16 LAB — HOMOCYSTEINE: Homocysteine: 12.7 umol/L (ref 0.0–15.0)

## 2018-01-16 NOTE — Telephone Encounter (Signed)
Printed and mailed calendar. °

## 2018-01-17 LAB — BETA-2-GLYCOPROTEIN I ABS, IGG/M/A
Beta-2 Glyco I IgG: <9 GPI IgG units (ref 0–20)
Beta-2-Glycoprotein I IgA: 17 GPI IgA units (ref 0–25)
Beta-2-Glycoprotein I IgM: <9 GPI IgM units (ref 0–32)

## 2018-01-17 LAB — CARDIOLIPIN ANTIBODIES, IGG, IGM, IGA
Anticardiolipin IgA: 9 APL U/mL (ref 0–11)
Anticardiolipin IgG: <9 GPL U/mL (ref 0–14)
Anticardiolipin IgM: <9 MPL U/mL (ref 0–12)

## 2018-01-17 LAB — PROTEIN S, ANTIGEN, FREE: Protein S Ag, Free: 102 % (ref 57–157)

## 2018-01-17 LAB — PROTEIN C, TOTAL: Protein C, Total: 107 % (ref 60–150)

## 2018-01-17 LAB — PROTEIN S ACTIVITY: Protein S Activity: 95 % (ref 63–140)

## 2018-01-17 LAB — PROTEIN C ACTIVITY: Protein C Activity: 126 % (ref 73–180)

## 2018-01-17 LAB — FACTOR 8 ASSAY: Coagulation Factor VIII: 132 % (ref 56–140)

## 2018-01-18 DIAGNOSIS — D6859 Other primary thrombophilia: Secondary | ICD-10-CM | POA: Insufficient documentation

## 2018-01-18 NOTE — Progress Notes (Signed)
Outpatient Hematology/Oncology Initial Consultation  Patient Name:  Phillip Frye  DOB: December 10, 1970   Date of Service: January 15, 2018  Referring Provider: [name], MD [address] [place], [place] [address]   Consulting Physician: Henreitta Leber, MD Hematology/Oncology  Reason for Referral: In the setting of a previous pulmonary embolism, identified initially on May 23, 2012 treated with rivaroxaban for approximately 6 months; currently not on anticoagulation, he presents now for consideration of a hypercoagulable evaluation in the absence of a recurrent venous thromboembolic event.  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  His primary care physician is Dr. [name].  On September 22, 2012 Phillip Frye presented to [facility] with progressive dyspnea over a 4-week period.  He had been coughing nonbloody clear sputum.  He had no fever or calf pain. He been using CPAP on a regular basis for obstructive sleep apnea.  He was referred to the emergency department at [facility] with mild hypoxemia.  Although the study was somewhat limited, a CT angiogram of the thorax revealed segmental and subsegmental size pulmonary emboli in the lower lungs of both lungs bilaterally. Those results are detailed below.   At the same time, a bilateral duplex venous Doppler failed to reveal any evidence of deep vein thrombosis involving the right or left lower extremity.  At the time of his initial presentation, laboratory studies were obtained for factor V Leiden gene mutation, prothrombin 20210A  and lupus anticoagulant. Those results were negative. Thereafter, he was treated with rivaroxaban by protocol. He discontinued treatment on his own after nearly 6 months. Because of chronic knee pain and bilateral "foot pain," a repeat duplex venous Doppler was performed on March 21, 2017.  Although portions of the examination were limited due to body habitus, there was no evidence of deep vein thrombosis on either the right or left lower extremity.  There is no cystic structure found in the popliteal fossa. There is a small amount of fluid seen anterior and lateral to the right knee.  He denies any appetite deficit. Over the past 2 months he has voluntarily lost 16 pounds.  He reports no rash or itching.  There is no headache, dizziness, lightheadedness, syncope, or near syncopal episodes.  He reports no new visual changes or hearing deficit.  He has no unusual cough, sore throat, or orthopnea.  He has no dyspnea at rest.  He has chronic but stable exertional dyspnea unchanged over his usual baseline.  There is no pain or difficulty in swallowing.  No fever, shaking chills, sweats, or flulike symptoms are evident.  He has no heartburn or indigestion.  He has no chest or abdominal pain. There is no nausea, vomiting, diarrhea, or constipation on a regular basis.    He has no viral hepatitis, inflammatory bowel disease, or symptomatic diverticulosis. He has never had a screening colonoscopy.  He denies melena or bright red blood per rectum. No urinary frequency, urgency, hematuria, dysuria are reported.  He does have chronic stable nocturia 5-6 times nightly. He has no swelling of his ankles.  He has no prior blood disorder or bleeding tendency.  He has intermittent tingling of the dorsum of his feet.  There is no  numbness or tingling in his fingers or toes. It is with this background he presents now for further diagnostic and therapeutic recommendations in the setting of a previous bilateral pulmonary embolism  treated with nearly 6 months of anticoagulation with rivaroxaban, to exclude an underlying thrombophilia either hereditary or acquired as outlined above.  Past Medical History:  Diagnosis Date  . Arthritis   . Diabetes mellitus without complication ([place])   . Dysrhythmia    unknown type  . GERD (gastroesophageal reflux disease)   . Hypertension    MD stopped BP med 1 yr ago due to normal BP  . Obesity   . Pulmonary embolism, bilateral ([place]) 09/2012   on xarelto  . Shortness of breath    with exertion  . Sleep apnea    uses c pap    Past Surgical History:  Procedure Laterality Date  . BREATH TEK H PYLORI N/A 12/28/2012   Procedure: BREATH TEK H PYLORI;  [place]: Gayland Curry, MD;  Location: [facility];  Service: General;  Laterality: N/A;  . HIP SURGERY Bilateral    to "shave gthe bones"  . LAPAROSCOPIC GASTRIC SLEEVE RESECTION N/A 05/31/2013   Procedure: LAPAROSCOPIC GASTRIC SLEEVE RESECTION;  [place]: Gayland Curry, MD;  Location: [facility];  Service: General;  Laterality: N/A;   Family History  Problem Relation Age of Onset  . Cirrhosis Father   . Diabetes Other   . Diabetes Other   . Emphysema Paternal Grandfather        smoked  Mother: Age 72 years: No major medical problems Father: Deceased, alcoholic cirrhosis Brothers (76): MVA/throat cancer Sisters (6): DM/?cancer  Social History   Socioeconomic History  . Marital status: Single    Spouse name: Not on file  . Number of children: 0  . Years of education: Not on file  . Highest education level: Not on file  Occupational History  . Not on file  Social Needs  . Financial resource strain: Not on file  . Food insecurity:    Worry: Not on file    Inability: Not on file  . Transportation needs:    Medical: Not on file    Non-medical: Not on file  Tobacco Use  . Smoking status: Current Every Day Smoker    Packs/day: 1.00    Years: 6.00    Pack years: 6.00    Types: Cigars  . Smokeless tobacco: Never Used    Substance and Sexual Activity  . Alcohol use: Yes    Comment: occasional  . Drug use: No  . Sexual activity: Not on file  Lifestyle  . Physical activity:    Days per week: Not on file    Minutes per session: Not on file  . Stress: Not on file  Relationships  . Social connections:    Talks on phone: Not on file    Gets together: Not on file    Attends religious service: Not on file    Active member of club or organization: Not on file    Attends meetings of clubs or organizations: Not on file    Relationship status: Not on file  . Intimate partner violence:    Fear of current or ex partner: Not on file    Emotionally abused: Not on file    Physically abused: Not on file    Forced sexual activity: Not on file  Other Topics Concern  . Not on file  Social History Narrative   Lives alone.  Works  at [employer].  Daquann is single. He has no children He does not smoke cigarettes. He smokes 10 cigars daily. He has no significant use of pipe or chewing tobacco. His alcohol intake in the past was heavy. He currently drinks vodka once monthly. He reports no recreational drug use.  Transfusion History: No prior transfusion  Exposure History: He reports no known exposure to toxic chemicals, radiation, or pesticides.  Allergies:  He has no known medical allergies He has no food allergies He has nonspecific seasonal allergies  Current Outpatient Medications on File Prior to Visit  Medication Sig  . amLODipine (NORVASC) 5 MG tablet Take 1 tablet (5 mg total) by mouth daily.  . blood glucose meter kit and supplies Dispense based on patient and insurance preference. Use up to four times daily as directed. (FOR ICD-10 E10.9, E11.9).  Marland Kitchen glucose blood test strip Up to 3 times per day - DM2 with hyperglycemia. Uncontrolled.  . Insulin Glargine (BASAGLAR KWIKPEN) 100 UNIT/ML SOPN Inject 0.5 mLs (50 Units total) into the skin daily.  . Insulin Pen Needle (PEN NEEDLES) 32G X 4 MM MISC 1  application by Does not apply route daily.  . metFORMIN (GLUCOPHAGE) 1000 MG tablet Take 1 tablet (1,000 mg total) by mouth 2 (two) times daily with a meal.  . levocetirizine (XYZAL) 5 MG tablet Take 1 tablet (5 mg total) by mouth every evening. (Patient not taking: Reported on 12/15/2017)  . [DISCONTINUED] metoprolol (LOPRESSOR) 50 MG tablet Take 50 mg by mouth 2 (two) times daily.     No current Frye-administered medications on file prior to visit.     Review of Systems: Constitutional: No fever, sweats, or shaking chills. No appetite deficit.  Voluntary weight loss. Skin: No rash, scaling, sores, lumps, or jaundice. HEENT: No visual changes or hearing deficit. Pulmonary: No unusual cough, sore throat, or orthopnea; active tobacco dependence; DOE; sleep apnea compliant with CPAP. Cardiovascular: No coronary artery disease, angina, or myocardial infarction.  No cardiac dysrhythmia; essential hypertension; no dyslipidemia. Gastrointestinal: No indigestion, dysphagia, abdominal pain, diarrhea, or constipation.  No change in bowel habits. Genitourinary: No urinary frequency, urgency, hematuria, or dysuria; chronic nocturia. Musculoskeletal: No new arthralgias or myalgias; bilateral knee and foot pain; no joint swelling or instability. Hematologic: No bleeding tendency or easy bruisability. Endocrine: No intolerance to hot or cold; no thyroid disease or diabetes mellitus. Vascular: No peripheral arterial; previous bilateral pulmonary emboli, 2014. Psychological: No anxiety, depression, or mood changes; no mental health illnesses. Neurological: No dizziness, lightheadedness, syncope, or near syncopal episodes; no numbness or tingling in the fingers or toes.  Physical Examination: Vital Signs: Body surface area is 2.88 meters squared.  Vitals:   01/15/18 1403  BP: 138/78  Pulse: 99  Resp: 19  Temp: 98.8 F (37.1 C)  SpO2: 98%    Filed Weights   01/15/18 1403  Weight: (!) 355 lb (161  kg)  ECOG PERFORMANCE STATUS: 0 Constitutional:  Phillip Frye is a fully nourished and developed African-American albeit overweight.  He looks age appropriate.  He is friendly and cooperative without respiratory compromise at rest. Skin: No rashes, scaling, dryness, jaundice, or itching. HEENT: Head is normocephalic and atraumatic.  Pupils are equal round and reactive to light and accommodation.  Sclerae are anicteric.  Conjunctivae are pink.  No sinus tenderness nor oropharyngeal lesions.  Lips without cracking or peeling; tongue without mass, inflammation, or nodularity.  Mucous membranes are moist. Neck: Supple and symmetric.  No jugular venous distention or  thyromegaly.  Trachea is midline. Lymphatics: No cervical or supraclavicular lymphadenopathy.  No epitrochlear, axillary, or inguinal lymphadenopathy is appreciated. Respiratory/chest: Thorax is symmetrical.  Breath sounds are clear to auscultation and percussion.  Normal excursion and respiratory effort. Back: Symmetric without deformity or tenderness. Cardiovascular: Heart rate and rhythm are regular. Gastrointestinal: Abdomen is soft, obese, nontender; no organomegaly.  Bowel sounds are normoactive.  No masses are appreciated. Genitourinary: Normal external male genitalia. Extremities: In the lower extremities, there is no asymmetric swelling, erythema, tenderness, or cord formation.  No clubbing, cyanosis, nor edema. Hematologic: No petechiae, hematomas, or ecchymoses. Psychological:  He is oriented to person, place, and time; normal affect, memory, and cognition. Neurological: There are no gross neurologic deficits.  Laboratory Results: I have reviewed the data as listed:  Ref Range & Units 3d ago (01/15/18) 14moago (12/15/17) 118mogo (11/30/17) 33m55moo (11/30/17)  WBC Count 4.0 - 10.5 K/uL 6.6   6.3    RBC 4.22 - 5.81 MIL/uL 4.61   4.83    Hemoglobin 13.0 - 17.0 g/dL 13.9  14.6  14.5  16.7   HCT 39.0 - 52.0 % 42.9  43.0  43.0   49.0   MCV 80.0 - 100.0 fL 93.1   89.0    MCH 26.0 - 34.0 pg 30.2   30.0    MCHC 30.0 - 36.0 g/dL 32.4   33.7    RDW 11.5 - 15.5 % 12.2   11.2Low     Platelet Count 150 - 400 K/uL 274   262    nRBC 0.0 - 0.2 % 0.0   0.0 CM   Neutrophils Relative % % 49      Neutro Abs 1.7 - 7.7 K/uL 3.2      Lymphocytes Relative % 41      Lymphs Abs 0.7 - 4.0 K/uL 2.7      Monocytes Relative % 9      Monocytes Absolute 0.1 - 1.0 K/uL 0.6      Eosinophils Relative % 1      Eosinophils Absolute 0.0 - 0.5 K/uL 0.0      Basophils Relative % 0      Basophils Absolute 0.0 - 0.1 K/uL 0.0      Immature Granulocytes % 0      Abs Immature Granulocytes 0.00 - 0.07 K/uL 0.01        Ref Range & Units 11d ago (01/07/18) 33mo79mo (12/15/17) 33mo 69mo(12/04/17) 33mo a75mo10/20/19)  Glucose 65 - 99 mg/dL 128High   343High  R 523High Panic  CM 674High Panic  R, CM  BUN 6 - 24 mg/dL 7  13 R 18  14 R  Creatinine, Ser 0.76 - 1.27 mg/dL 0.95  0.90 R 1.26  1.37High  R  GFR calc non Af Amer >59 mL/min/1.73 94   67  59Low  R  GFR calc Af Amer >59 mL/min/1.73 108   77  >60 R, CM  BUN/Creatinine Ratio 9 - 20 7Low    14    Sodium 134 - 144 mmol/L 142  136 R 131Low   127Low  R  Potassium 3.5 - 5.2 mmol/L 4.5  4.0 R 4.6  4.9 R  Chloride 96 - 106 mmol/L 103  98 R 91Low   89Low  R  CO2 20 - 29 mmol/L _0 R  Calcium 8.7 - 10.2 mg/dL 9.7   9.8  9.6 R  Total Protein 6.0 - 8.5 g/dL  6.9      Albumin 3.5 - 5.5 g/dL 4.3      Globulin, Total 1.5 - 4.5 g/dL 2.6      Albumin/Globulin Ratio 1.2 - 2.2 1.7      Bilirubin Total 0.0 - 1.2 mg/dL 0.4      Alkaline Phosphatase 39 - 117 IU/L 86      AST 0 - 40 IU/L 15      ALT 0 - 44 IU/L 24           Diagnostic/Imaging Studies: January 20, 2018 Bilateral duplex venous Doppler Final Interpretation: Right: There is no evidence of deep vein thrombosis in the lower extremity. However, portions of this examination were limited- see technologist comments above. No cystic structure  found in the popliteal fossa. Moderate amount of fluid seen anterior and  lateral to right knee. Left: No evidence of common femoral vein obstruction. Electronically signed by Servando Snare on 03/23/2017 at 2:07:33 PM.  September 22, 2012 Bilateral Lower Extremity Venous Duplex No evidence of deep vein thrombosis involving the right lower extremity. No evidence of deep vein thrombosis involving the left lower extremity. Other specific details can be found in the table(s) above.  Gae Gallop 2014-08-13T06:[id number]  September 22, 2012 CT ANGIOGRAPHY CHEST  Technique:  Multidetector CT imaging of the chest using the standard protocol during bolus administration of intravenous contrast. Multiplanar reconstructed images including MIPs were obtained and reviewed to evaluate the vascular anatomy.  Contrast: 144m OMNIPAQUE IOHEXOL 350 MG/ML SOLN  Comparison: Chest CT 03/05/2006.  Findings:  Mediastinum: Study is limited by considerable respiratory motion. Despite these limitations, there appear to be multiple segmental and subsegmental sized filling defects in the pulmonary arteries of the lower lobes of the lungs bilaterally, compatible with pulmonary embolism.  No larger central or lobar sized filling defect is noted. Heart size is mildly enlarged. There is no significant pericardial fluid, thickening or pericardial calcification. No pathologically enlarged mediastinal or hilar lymph nodes. Esophagus is unremarkable in appearance.  Lungs/Pleura: No acute consolidative airspace disease.  No pleural effusions.  No definite suspicious appearing pulmonary nodules or masses are identified at this time.  Upper Abdomen: Unremarkable.  Musculoskeletal: There are no aggressive appearing lytic or blastic lesions noted in the visualized portions of the skeleton.  IMPRESSION: 1.  Although the examination is slightly limited, the study is positive for segmental and  subsegmental sized pulmonary emboli in the lower lobes of the lungs bilaterally. 2.  Mild cardiomegaly.  Critical Value/emergent results were called by telephone at the time of interpretation on 09/22/2012 at 03:03 AM to Dr. OMarnette Burgess who verbally acknowledged these results.  Original Report Authenticated By: DVinnie Langton M.D.  Summary/Assessment: In the setting of a previous pulmonary embolism, identified initially on May 23, 2012 treated with rivaroxaban for approximately 6 months; currently not on anticoagulation, he presents now for consideration of a hypercoagulable evaluation in the absence of a recurrent venous thromboembolic event.  [facility]week period.  He had been coughing nonbloody clear sputum.  He had no fever or calf pain. He been using CPAP on a regular basis for obstructive sleep apnea.  [facility].  Although the study was somewhat limited, a CT angiogram of the thorax revealed segmental and subsegmental size pulmonary emboli in the lower lungs of both lungs bilaterally. Those results are detailed below.  At the same time, a bilateral duplex venous Doppler failed to reveal any evidence of deep vein thrombosis involving the right or left lower extremity.  At the time of his initial presentation, laboratory studies were obtained for factor V Leiden gene mutation, prothrombin 20210A and lupus anticoagulant. Those results were negative. Thereafter, he was treated with rivaroxaban by protocol. He discontinued treatment on his own after nearly 6 months. Because of chronic knee pain and bilateral "foot pain," a repeat duplex venous Doppler was performed on March 21, 2017.  Although portions of the examination were limited due to body habitus, there was no evidence of deep vein thrombosis on either the right or left lower extremity.   There is no cystic structure found in the popliteal fossa. There is a small amount of fluid seen anterior and lateral to the right knee.  He denies any appetite deficit. Over the past 2 months he has voluntarily lost 16 pounds.  He reports no rash or itching.  There is no headache, dizziness, lightheadedness, syncope, or near syncopal episodes.  He reports no new visual changes or hearing deficit.  He has no unusual cough, sore throat, or orthopnea.  He has no dyspnea at rest.  He has chronic but stable exertional dyspnea unchanged over his usual baseline.  There is no pain or difficulty in swallowing.  No fever, shaking chills, sweats, or flulike symptoms are evident.  He has no heartburn or indigestion.  He has no chest or abdominal pain. There is no nausea, vomiting, diarrhea, or constipation on a regular basis.    He has no viral hepatitis, inflammatory bowel disease, or symptomatic diverticulosis. He has never had a screening colonoscopy.  He denies melena or bright red blood per rectum. No urinary frequency, urgency, hematuria, dysuria are reported.  He does have chronic stable nocturia 5-6 times nightly.  He has no swelling of his ankles.  He has no prior blood disorder or bleeding tendency.  He has intermittent tingling of the dorsum of his feet.  There is no numbness or tingling in his fingers or toes.    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   Recommendation/Plan: We discussed his previous history and imaging studies.  It appears that he discontinued rivaroxaban due to financial concerns.  The results of his laboratory studies were not available at the time of discharge.   They will be discussed in detail at the time of his next visit.  A battery of laboratory studies were obtained to exclude an underlying hereditary/acquired thrombophilia.  His prior factor V Leiden gene mutation, prothrombin gene mutation, and lupus anticoagulant were not detected.  They will not be repeated.  Additional laboratory studies at this time include factor VIII, protein S, protein C, antithrombin III, serum homocysteine, antiphospholipid antibody profile, and a d-dimer.  Barring any unforeseen complications, his neck scheduled doctor visit to discuss those results is on December 20.  At that time, because I am leaving the practice, he will be introduced to a new provider who will give him those results with recommendations.  He was advised to call us in the interim should any new or untoward problems arise.  This note was dictated using voice activated technology/software.  Unfortunately, typographical errors are not uncommon, and transcription is subject  to mistakes and regrettably misinterpretation.  If necessary, clarification of the above information can be discussed with me at any time.  Thank you Dr. Carlota Raspberry for allowing my participation in the care of Phillip Frye. I will keep you closely informed as the results of his preliminary laboratory data become available.  Please do not hesitate to call should any questions arise regarding this initial consultation and discussion.  FOLLOW UP: AS DIRECTED   cc:          [name], MD   Henreitta Leber, MD  Hematology/Oncology [facility] [address] [place], [place] [address] Office: [phone] GQBV: [phone]

## 2018-01-19 ENCOUNTER — Ambulatory Visit (INDEPENDENT_AMBULATORY_CARE_PROVIDER_SITE_OTHER): Payer: 59 | Admitting: Family Medicine

## 2018-01-19 ENCOUNTER — Encounter: Payer: Self-pay | Admitting: Family Medicine

## 2018-01-19 ENCOUNTER — Other Ambulatory Visit: Payer: Self-pay

## 2018-01-19 VITALS — BP 129/87 | HR 84 | Temp 98.6°F | Ht 72.0 in | Wt 353.0 lb

## 2018-01-19 DIAGNOSIS — Z794 Long term (current) use of insulin: Secondary | ICD-10-CM

## 2018-01-19 DIAGNOSIS — E1165 Type 2 diabetes mellitus with hyperglycemia: Secondary | ICD-10-CM | POA: Diagnosis not present

## 2018-01-19 DIAGNOSIS — Z9189 Other specified personal risk factors, not elsewhere classified: Secondary | ICD-10-CM | POA: Diagnosis not present

## 2018-01-19 LAB — GLUCOSE, POCT (MANUAL RESULT ENTRY): POC Glucose: 87 mg/dl (ref 70–99)

## 2018-01-19 MED ORDER — BASAGLAR KWIKPEN 100 UNIT/ML ~~LOC~~ SOPN: 50.0000 [IU] | PEN_INJECTOR | Freq: Every day | SUBCUTANEOUS | 2 refills | Status: DC

## 2018-01-19 MED ORDER — ATORVASTATIN CALCIUM 10 MG PO TABS: 10.0000 mg | ORAL_TABLET | Freq: Every day | ORAL | 0 refills | Status: DC

## 2018-01-19 MED FILL — BASAGLAR 100 UNIT/ML KWIKPE: 100 | 30 days supply | Qty: 15 | Fill #0

## 2018-01-19 MED FILL — ATORVASTATIN 10 MG TABLET: 10 | 90 days supply | Qty: 90 | Fill #0

## 2018-01-19 NOTE — Progress Notes (Signed)
Subjective:    Patient ID: Phillip Frye, male    DOB: 02/24/1970, 47 y.o.   MRN: [id number]  HPI  Phillip Frye is a 47 y.o. male Presents today for: Chief Complaint  Patient presents with  . Diabetes    1 week f/u   . Medication Refill    insulin, 30units BID and 1 pen last only 2.5 days so need at lease 3 month supply   . lab review   Here for follow up diabetes: Last seen 11/27. At that time in office reading of 130. He was on 50 units Basaglar and metformin '500mg'$  BID. Did note readings of 200-300 at times after eating, with low of 157. Planned on same dose Basaglar, increased metfomin to '1000mg'$  BID, importance of regular meals discussed and handout on diabetes self care given.   Increased dose of metformin is tolerated - no diarrhea or abd pain.  Has been taking 30 units twice per day - needle burns or bends at times, so easier to use.  Fasting have been 82-112. Lowest is 82. Ate a piece of candy (felt dizzy).  appt with nutritionist.   LDL close to goal on lipids, CMP reassuring last visit.  On norvasc for BP.  BP Readings from Last 3 Encounters:  01/19/18 129/87  01/15/18 138/78  01/07/18 133/89   Lab Results  Component Value Date   HGBA1C 12.8 (A) 12/04/2017   HGBA1C 6.4 (H) 12/14/2012   Lab Results  Component Value Date   LDLCALC 72 01/07/2018   CREATININE 0.95 01/07/2018   Wt Readings from Last 3 Encounters:  01/19/18 (!) 353 lb (160.1 kg)  01/15/18 (!) 355 lb (161 kg)  01/07/18 (!) 353 lb 9.6 oz (160.4 kg)  UTD on diabetes health maintenance.    Patient Active Problem List   Diagnosis Date Noted  . Primary hypercoagulable state ([place]) 01/18/2018  . 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 Abnormal EKG 12/23/2012  . Obesity, Class III, BMI 40-49.9 (morbid obesity) ([place]) 12/11/2012  . SOB (shortness of breath) 09/22/2012  . Pulmonary embolism, bilateral ([place]) 09/22/2012  . Diabetes mellitus without complication ([place])   . DENTAL PAIN  03/31/2008  . CARPAL TUNNEL SYNDROME, BILATERAL 10/09/2007  . ULNAR NEUROPATHY 10/09/2007  . DENTAL CARIES 10/09/2007  . GERD 10/09/2007  . INGUINAL PAIN, BILATERAL 02/26/2007  . KNEE PAIN, RIGHT 12/19/2006  . FOOT PAIN, BILATERAL 12/19/2006  . HYPERTENSION 09/18/2006  . AVASCULAR NECROSIS 09/18/2006  . SLEEP APNEA 09/18/2006   Past Medical History:  Diagnosis Date  . Arthritis   . Diabetes mellitus without complication ([place])   . Dysrhythmia    unknown type  . GERD (gastroesophageal reflux disease)   . Hypertension    MD stopped BP med 1 yr ago due to normal BP  . Obesity   . Pulmonary embolism, bilateral ([place]) 09/2012   on xarelto  . Shortness of breath    with exertion  . Sleep apnea    uses c pap    Past Surgical History:  Procedure Laterality Date  . BREATH TEK H PYLORI N/A 12/28/2012   Procedure: BREATH TEK H PYLORI;  [place]: Gayland Curry, MD;  Location: [facility];  Service: General;  Laterality: N/A;  . HIP SURGERY Bilateral    to "shave gthe bones"  . LAPAROSCOPIC GASTRIC SLEEVE RESECTION N/A 05/31/2013   Procedure: LAPAROSCOPIC GASTRIC SLEEVE RESECTION;  [place]: Gayland Curry, MD;  Location: [facility];  Service: General;  Laterality: N/A;  No Known Allergies Prior to Admission medications   Medication Sig Start Date End Date Taking? Authorizing Provider  amLODipine (NORVASC) 5 MG tablet Take 1 tablet (5 mg total) by mouth daily. 01/01/18  Yes Wendie Agreste, MD  blood glucose meter kit and supplies Dispense based on patient and insurance preference. Use up to four times daily as directed. (FOR ICD-10 E10.9, E11.9). 01/15/18  Yes Wendie Agreste, MD  glucose blood test strip Up to 3 times per day - DM2 with hyperglycemia. Uncontrolled. 12/04/17  Yes Wendie Agreste, MD  Insulin Glargine (BASAGLAR KWIKPEN) 100 UNIT/ML SOPN Inject 0.5 mLs (50 Units total) into the skin daily. Patient taking differently: Inject 60 Units into the skin daily.  01/15/18  Yes  Wendie Agreste, MD  Insulin Pen Needle (PEN NEEDLES) 32G X 4 MM MISC 1 application by Does not apply route daily. 12/04/17  Yes Wendie Agreste, MD  levocetirizine (XYZAL) 5 MG tablet Take 1 tablet (5 mg total) by mouth every evening. 08/23/17  Yes Dutch Quint B, FNP  metFORMIN (GLUCOPHAGE) 1000 MG tablet Take 1 tablet (1,000 mg total) by mouth 2 (two) times daily with a meal. 01/07/18  Yes Wendie Agreste, MD  Blood Glucose Monitoring Suppl (FREESTYLE LITE) DEVI  01/15/18   [provider]  Lancets (FREESTYLE) lancets  01/15/18   [provider]  metoprolol (LOPRESSOR) 50 MG tablet Take 50 mg by mouth 2 (two) times daily.    04/21/11  [provider]   Social History   Socioeconomic History  . Marital status: Single    Spouse name: Not on file  . Number of children: 0  . Years of education: Not on file  . Highest education level: Not on file  Occupational History  . Not on file  Social Needs  . Financial resource strain: Not on file  . Food insecurity:    Worry: Not on file    Inability: Not on file  . Transportation needs:    Medical: Not on file    Non-medical: Not on file  Tobacco Use  . Smoking status: Current Every Day Smoker    Packs/day: 1.00    Years: 6.00    Pack years: 6.00    Types: Cigars  . Smokeless tobacco: Never Used  Substance and Sexual Activity  . Alcohol use: Yes    Comment: occasional  . Drug use: No  . Sexual activity: Not on file  Lifestyle  . Physical activity:    Days per week: Not on file    Minutes per session: Not on file  . Stress: Not on file  Relationships  . Social connections:    Talks on phone: Not on file    Gets together: Not on file    Attends religious service: Not on file    Active member of club or organization: Not on file    Attends meetings of clubs or organizations: Not on file    Relationship status: Not on file  . Intimate partner violence:    Fear of current or ex partner: Not on file      Emotionally abused: Not on file    Physically abused: Not on file    Forced sexual activity: Not on file  Other Topics Concern  . Not on file  Social History Narrative   Lives alone.  Works at [employer].    Review of Systems Per HPI.      Objective:   Physical Exam  Constitutional: He is oriented to person, place, and time. He appears well-developed and well-nourished.  HENT:  Head: Normocephalic and atraumatic.  Eyes: Pupils are equal, round, and reactive to light. EOM are normal.  Neck: No JVD present. Carotid bruit is not present.  Cardiovascular: Normal rate, regular rhythm and normal heart sounds.  No murmur heard. Pulmonary/Chest: Effort normal and breath sounds normal. He has no rales.  Musculoskeletal: He exhibits no edema.  Neurological: He is alert and oriented to person, place, and time.  Skin: Skin is warm and dry.  Psychiatric: He has a normal mood and affect.  Vitals reviewed.  Vitals:   01/19/18 1637  BP: 129/87  Pulse: 84  Temp: 98.6 F (37 C)  TempSrc: Oral  SpO2: 96%  Weight: (!) 353 lb (160.1 kg)  Height: 6' (1.829 m)   Results for orders placed or performed in visit on 01/19/18  POCT glucose (manual entry)  Result Value Ref Range   POC Glucose 87 70 - 99 mg/dl       Assessment & Plan:    Phillip Frye is a 47 y.o. male Type 2 diabetes mellitus with hyperglycemia, with long-term current use of insulin ([place]) - Plan: POCT glucose (manual entry), atorvastatin (LIPITOR) 10 MG tablet  Type 2 diabetes mellitus with hyperglycemia, without long-term current use of insulin ([place]) - Plan: Insulin Glargine (BASAGLAR KWIKPEN) 100 UNIT/ML SOPN  At risk for cardiovascular event - Plan: atorvastatin (LIPITOR) 10 MG tablet  Concern regarding dosing of insulin (even beyond the 60 units when planned on 50 units)  as he should not be out of pens as soon as discussed.  In office glucose also borderline low.   -Dosing again reviewed and use of pen, plan  is to bring back his pen to the office for nurse visit to ensure he is using it correctly.  -Decrease back to 50 units of Lantus per day, continue metformin same dose, and hypoglycemia precautions given.  Follow-up with nutritionist as planned as diet likely impacting reading variability.  -LDL close to goal, but with diabetes will start Lipitor 10 mg daily.  Potential side effects, risks of meds discussed.  Recheck level 6 weeks.  Meds ordered this encounter  Medications  . Insulin Glargine (BASAGLAR KWIKPEN) 100 UNIT/ML SOPN    Sig: Inject 0.5 mLs (50 Units total) into the skin daily.    Dispense:  15 pen    Refill:  2  . atorvastatin (LIPITOR) 10 MG tablet    Sig: Take 1 tablet (10 mg total) by mouth daily at 6 PM.    Dispense:  90 tablet    Refill:  0   Patient Instructions    Call oncologist regarding any questions on their bloodwork.   Return to 50 units of insulin. If any low blood sugars on that dose, we may need to adjust further.  Each pen should have 300 units of insulin or 3 mL's, so should last approximately 6 days.  I sent more refills in. Please come her tomorrow morning  with your medicine to make sure it is being given accurately.   Start lipitor once per day, recheck in 6 weeks for repeat liver tests and evaluation of diabetes. Let me know if there are questions in the meantime.    Type 2 Diabetes Mellitus, Self Care, Adult When you have type 2 diabetes (type 2 diabetes mellitus), you must keep your blood sugar (glucose) under control. You can do this with:  Nutrition.  Exercise.  Lifestyle changes.  Medicines or insulin, if needed.  Support from your doctors and others.  How do I manage my blood sugar?  Check your blood sugar level every day, as often as told.  Call your doctor if your blood sugar is above your goal numbers for 2 tests in a row.  Have your A1c (hemoglobin A1c) level checked at least two times a year. Have it checked more often if your  doctor tells you to. Your doctor will set treatment goals for you. Generally, you should have these blood sugar levels:  Before meals (preprandial): 80-130 mg/dL (4.4-7.2 mmol/L).  After meals (postprandial): lower than 180 mg/dL (10 mmol/L).  A1c level: less than 7%.  What do I need to know about high blood sugar? High blood sugar is called hyperglycemia. Know the signs of high blood sugar. Signs may include:  Feeling: ? Thirsty. ? Hungry. ? Very tired.  Needing to pee (urinate) more than usual.  Blurry vision.  What do I need to know about low blood sugar? Low blood sugar is called hypoglycemia. This is when blood sugar is at or below 70 mg/dL (3.9 mmol/L). Symptoms may include:  Feeling: ? Hungry. ? Worried or nervous (anxious). ? Sweaty and clammy. ? Confused. ? Dizzy. ? Sleepy. ? Sick to your stomach (nauseous).  Having: ? A fast heartbeat (palpitations). ? A headache. ? A change in your vision. ? Jerky movements that you cannot control (seizure). ? Nightmares. ? Tingling or no feeling (numbness) around the mouth, lips, or tongue.  Having trouble with: ? Talking. ? Paying attention (concentrating). ? Moving (coordination). ? Sleeping.  Shaking.  Passing out (fainting).  Getting upset easily (irritability).  Treating low blood sugar  To treat low blood sugar, eat or drink something sugary right away. If you can think clearly and swallow safely, follow the 15:15 rule:  Take 15 grams of a fast-acting carb (carbohydrate). Some fast-acting carbs are: ? 1 tube of glucose gel. ? 3 sugar tablets (glucose pills). ? 6-8 pieces of hard candy. ? 4 oz (120 mL) of fruit juice. ? 4 oz (120 mL) regular (not diet) soda.  Check your blood sugar 15 minutes after you take the carb.  If your blood sugar is still at or below 70 mg/dL (3.9 mmol/L), take 15 grams of a carb again.  If your blood sugar does not go above 70 mg/dL (3.9 mmol/L) after 3 tries, get help  right away.  After your blood sugar goes back to normal, eat a meal or a snack within 1 hour.  Treating very low blood sugar If your blood sugar is at or below 54 mg/dL (3 mmol/L), you have very low blood sugar (severe hypoglycemia). This is an emergency. Do not wait to see if the symptoms will go away. Get medical help right away. Call your local emergency services (911 in the [place] Do not drive yourself to the hospital. If you have very low blood sugar and you cannot eat or drink, you may need a glucagon shot (injection). A family member or friend should learn how to check your blood sugar and how to give you a glucagon shot. Ask your doctor if you need to have a glucagon shot kit at home. What else is important to manage my diabetes? Medicine Follow these instructions about insulin and diabetes medicines:  Take them as told by your doctor.  Adjust them as told by your doctor.  Do not run out of them.  Having diabetes can  raise your risk for other long-term conditions. These include heart or kidney disease. Your doctor may prescribe medicines to help prevent problems from diabetes. Food   Make healthy food choices. These include: ? Chicken, fish, egg whites, and beans. ? Oats, whole wheat, bulgur, brown rice, quinoa, and millet. ? Fresh fruits and vegetables. ? Low-fat dairy products. ? Nuts, avocado, olive oil, and canola oil.  Make a food plan with a specialist (dietitian).  Follow instructions from your doctor about what you cannot eat or drink.  Drink enough fluid to keep your pee (urine) clear or pale yellow.  Eat healthy snacks between healthy meals.  Keep track of carbs that you eat. Read food labels. Learn food serving sizes.  Follow your sick day plan when you cannot eat or drink normally. Make this plan with your doctor so it is ready to use. Activity  Exercise at least 3 times a week.  Do not go more than 2 days without exercising.  Talk with your doctor  before you start a new exercise. Your doctor may need to adjust your insulin, medicines, or food. Lifestyle   Do not use any tobacco products. These include cigarettes, chewing tobacco, and e-cigarettes.If you need help quitting, ask your doctor.  Ask your doctor how much alcohol is safe for you.  Learn to deal with stress. If you need help with this, ask your doctor. Body care  Stay up to date with your shots (immunizations).  Have your eyes and feet checked by a doctor as often as told.  Check your skin and feet every day. Check for cuts, bruises, redness, blisters, or sores.  Brush your teeth and gums two times a day.  Floss at least one time a day.  Go to the dentist least one time every 6 months.  Stay at a healthy weight. General instructions   Take over-the-counter and prescription medicines only as told by your doctor.  Share your diabetes care plan with: ? Your work or school. ? People you live with.  Check your pee (urine) for ketones: ? When you are sick. ? As told by your doctor.  Carry a card or wear jewelry that says that you have diabetes.  Ask your doctor: ? Do I need to meet with a diabetes educator? ? Where can I find a support group for people with diabetes?  Keep all follow-up visits as told by your doctor. This is important. Where to find more information: To learn more about diabetes, visit:  American Diabetes Association: www.diabetes.org  American Association of Diabetes Educators: www.diabeteseducator.org/patient-resources  This information is not intended to replace advice given to you by your health care provider. Make sure you discuss any questions you have with your health care provider. Document Released: 05/22/2015 Document Revised: 07/06/2015 Document Reviewed: 03/03/2015 Elsevier Interactive Patient Education  [employer].   If you have lab work done today you will be contacted with your lab results within the next 2  weeks.  If you have not heard from [place] then please contact us. The fastest way to get your results is to register for My Chart.  IF you received an x-ray today, you will receive an invoice from [facility]. Please contact [facility] at [phone] with questions or concerns regarding your invoice.   IF you received labwork today, you will receive an invoice from [place]. Please contact [facility] at [phone] with questions or concerns regarding your invoice.   Our billing staff will not be able to  assist you with questions regarding bills from these companies.  You will be contacted with the lab results as soon as they are available. The fastest way to get your results is to activate your My Chart account. Instructions are located on the last page of this paperwork. If you have not heard from [place] regarding the results in 2 weeks, please contact this office.      Signed,   [name], MD [facility].  01/22/18 2:15 PM

## 2018-01-19 NOTE — Patient Instructions (Addendum)
Call oncologist regarding any questions on their bloodwork.   Return to 50 units of insulin. If any low blood sugars on that dose, we may need to adjust further.  Each pen should have 300 units of insulin or 3 mL's, so should last approximately 6 days.  I sent more refills in. Please come her tomorrow morning  with your medicine to make sure it is being given accurately.   Start lipitor once per day, recheck in 6 weeks for repeat liver tests and evaluation of diabetes. Let me know if there are questions in the meantime.    Type 2 Diabetes Mellitus, Self Care, Adult When you have type 2 diabetes (type 2 diabetes mellitus), you must keep your blood sugar (glucose) under control. You can do this with:  Nutrition.  Exercise.  Lifestyle changes.  Medicines or insulin, if needed.  Support from your doctors and others.  How do I manage my blood sugar?  Check your blood sugar level every day, as often as told.  Call your doctor if your blood sugar is above your goal numbers for 2 tests in a row.  Have your A1c (hemoglobin A1c) level checked at least two times a year. Have it checked more often if your doctor tells you to. Your doctor will set treatment goals for you. Generally, you should have these blood sugar levels:  Before meals (preprandial): 80-130 mg/dL (4.4-7.2 mmol/L).  After meals (postprandial): lower than 180 mg/dL (10 mmol/L).  A1c level: less than 7%.  What do I need to know about high blood sugar? High blood sugar is called hyperglycemia. Know the signs of high blood sugar. Signs may include:  Feeling: ? Thirsty. ? Hungry. ? Very tired.  Needing to pee (urinate) more than usual.  Blurry vision.  What do I need to know about low blood sugar? Low blood sugar is called hypoglycemia. This is when blood sugar is at or below 70 mg/dL (3.9 mmol/L). Symptoms may include:  Feeling: ? Hungry. ? Worried or nervous (anxious). ? Sweaty and  clammy. ? Confused. ? Dizzy. ? Sleepy. ? Sick to your stomach (nauseous).  Having: ? A fast heartbeat (palpitations). ? A headache. ? A change in your vision. ? Jerky movements that you cannot control (seizure). ? Nightmares. ? Tingling or no feeling (numbness) around the mouth, lips, or tongue.  Having trouble with: ? Talking. ? Paying attention (concentrating). ? Moving (coordination). ? Sleeping.  Shaking.  Passing out (fainting).  Getting upset easily (irritability).  Treating low blood sugar  To treat low blood sugar, eat or drink something sugary right away. If you can think clearly and swallow safely, follow the 15:15 rule:  Take 15 grams of a fast-acting carb (carbohydrate). Some fast-acting carbs are: ? 1 tube of glucose gel. ? 3 sugar tablets (glucose pills). ? 6-8 pieces of hard candy. ? 4 oz (120 mL) of fruit juice. ? 4 oz (120 mL) regular (not diet) soda.  Check your blood sugar 15 minutes after you take the carb.  If your blood sugar is still at or below 70 mg/dL (3.9 mmol/L), take 15 grams of a carb again.  If your blood sugar does not go above 70 mg/dL (3.9 mmol/L) after 3 tries, get help right away.  After your blood sugar goes back to normal, eat a meal or a snack within 1 hour.  Treating very low blood sugar If your blood sugar is at or below 54 mg/dL (3 mmol/L), you have very low blood  sugar (severe hypoglycemia). This is an emergency. Do not wait to see if the symptoms will go away. Get medical help right away. Call your local emergency services (911 in the [place] Do not drive yourself to the hospital. If you have very low blood sugar and you cannot eat or drink, you may need a glucagon shot (injection). A family member or friend should learn how to check your blood sugar and how to give you a glucagon shot. Ask your doctor if you need to have a glucagon shot kit at home. What else is important to manage my diabetes? Medicine Follow these  instructions about insulin and diabetes medicines:  Take them as told by your doctor.  Adjust them as told by your doctor.  Do not run out of them.  Having diabetes can raise your risk for other long-term conditions. These include heart or kidney disease. Your doctor may prescribe medicines to help prevent problems from diabetes. Food   Make healthy food choices. These include: ? Chicken, fish, egg whites, and beans. ? Oats, whole wheat, bulgur, brown rice, quinoa, and millet. ? Fresh fruits and vegetables. ? Low-fat dairy products. ? Nuts, avocado, olive oil, and canola oil.  Make a food plan with a specialist (dietitian).  Follow instructions from your doctor about what you cannot eat or drink.  Drink enough fluid to keep your pee (urine) clear or pale yellow.  Eat healthy snacks between healthy meals.  Keep track of carbs that you eat. Read food labels. Learn food serving sizes.  Follow your sick day plan when you cannot eat or drink normally. Make this plan with your doctor so it is ready to use. Activity  Exercise at least 3 times a week.  Do not go more than 2 days without exercising.  Talk with your doctor before you start a new exercise. Your doctor may need to adjust your insulin, medicines, or food. Lifestyle   Do not use any tobacco products. These include cigarettes, chewing tobacco, and e-cigarettes.If you need help quitting, ask your doctor.  Ask your doctor how much alcohol is safe for you.  Learn to deal with stress. If you need help with this, ask your doctor. Body care  Stay up to date with your shots (immunizations).  Have your eyes and feet checked by a doctor as often as told.  Check your skin and feet every day. Check for cuts, bruises, redness, blisters, or sores.  Brush your teeth and gums two times a day.  Floss at least one time a day.  Go to the dentist least one time every 6 months.  Stay at a healthy weight. General  instructions   Take over-the-counter and prescription medicines only as told by your doctor.  Share your diabetes care plan with: ? Your work or school. ? People you live with.  Check your pee (urine) for ketones: ? When you are sick. ? As told by your doctor.  Carry a card or wear jewelry that says that you have diabetes.  Ask your doctor: ? Do I need to meet with a diabetes educator? ? Where can I find a support group for people with diabetes?  Keep all follow-up visits as told by your doctor. This is important. Where to find more information: To learn more about diabetes, visit:  American Diabetes Association: www.diabetes.org  American Association of Diabetes Educators: www.diabeteseducator.org/patient-resources  This information is not intended to replace advice given to you by your health care provider. Make sure you  discuss any questions you have with your health care provider. Document Released: 05/22/2015 Document Revised: 07/06/2015 Document Reviewed: 03/03/2015 Elsevier Interactive Patient Education  [employer].   If you have lab work done today you will be contacted with your lab results within the next 2 weeks.  If you have not heard from [place] then please contact us. The fastest way to get your results is to register for My Chart.  IF you received an x-ray today, you will receive an invoice from [facility]. Please contact [facility] at [phone] with questions or concerns regarding your invoice.   IF you received labwork today, you will receive an invoice from [place]. Please contact [facility] at [phone] with questions or concerns regarding your invoice.   Our billing staff will not be able to assist you with questions regarding bills from these companies.  You will be contacted with the lab results as soon as they are available. The fastest way to get your results is to activate your My Chart account. Instructions are located on  the last page of this paperwork. If you have not heard from [place] regarding the results in 2 weeks, please contact this office.

## 2018-01-20 ENCOUNTER — Ambulatory Visit: Payer: Self-pay | Admitting: *Deleted

## 2018-01-21 ENCOUNTER — Telehealth: Payer: Self-pay | Admitting: Family Medicine

## 2018-01-21 NOTE — Telephone Encounter (Signed)
Attempted a PA for Basaglar - pt no longer has [employer] [employer] insurance and seems to only have Part A for Medicare? I tried to call pt to verify insurance and VM was full. If pt calls back, please verify what type of insurance he has and send a CRM to [place] so we may attempt any Prior Auths if needed for medication. Thank you!

## 2018-01-21 NOTE — Telephone Encounter (Signed)
Patient says he also has Medicare Part D and to call back if any additional questions.

## 2018-01-21 NOTE — Telephone Encounter (Signed)
Attempted to call pt again - VM STILL FULL. When pt calls back, PLEASE VERIFY WHAT TYPE OF INSURANCE HE HAS. Medicare A & B , [facility] [employer]/UHC, BCBS, Aetna or anything else. I need the numbers (member ID, group #, RX BIN number) of ANY/ALL insurance that he has. I can't do a prior authorization without the insurance information. Thank you!

## 2018-01-22 ENCOUNTER — Encounter: Payer: Self-pay | Admitting: Family Medicine

## 2018-01-26 ENCOUNTER — Inpatient Hospital Stay (HOSPITAL_BASED_OUTPATIENT_CLINIC_OR_DEPARTMENT_OTHER): Payer: 59 | Admitting: Nurse Practitioner

## 2018-01-26 VITALS — BP 134/95 | HR 84 | Temp 98.8°F | Resp 18 | Ht 72.0 in | Wt 353.3 lb

## 2018-01-26 DIAGNOSIS — E66813 Obesity, class 3: Secondary | ICD-10-CM

## 2018-01-26 DIAGNOSIS — I10 Essential (primary) hypertension: Secondary | ICD-10-CM

## 2018-01-26 DIAGNOSIS — G4733 Obstructive sleep apnea (adult) (pediatric): Secondary | ICD-10-CM

## 2018-01-26 DIAGNOSIS — Z7982 Long term (current) use of aspirin: Secondary | ICD-10-CM

## 2018-01-26 DIAGNOSIS — Z720 Tobacco use: Secondary | ICD-10-CM

## 2018-01-26 DIAGNOSIS — E119 Type 2 diabetes mellitus without complications: Secondary | ICD-10-CM

## 2018-01-26 DIAGNOSIS — Z716 Tobacco abuse counseling: Secondary | ICD-10-CM

## 2018-01-26 DIAGNOSIS — E1142 Type 2 diabetes mellitus with diabetic polyneuropathy: Secondary | ICD-10-CM

## 2018-01-26 DIAGNOSIS — Z9119 Patient's noncompliance with other medical treatment and regimen: Secondary | ICD-10-CM

## 2018-01-26 DIAGNOSIS — Z86711 Personal history of pulmonary embolism: Secondary | ICD-10-CM

## 2018-01-26 NOTE — Progress Notes (Signed)
Outpatient Hematology/Oncology Initial Consultation  Patient Name:  Phillip Frye  DOB: [birth year]   Date of Service: January 15, 2018  Referring Provider: [name], MD [address] [place], [place] [address]   Consulting Physician: Phillip Salinas, NP Hematology/Oncology  Reason for Referral: In the setting of a previous pulmonary embolism, identified initially on May 23, 2012 treated with rivaroxaban for approximately 6 months; currently not on anticoagulation, he presents now for consideration of a hypercoagulable evaluation in the absence of a recurrent venous thromboembolic event.  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  His primary care physician is Dr. Merri Frye.  On September 22, 2012 Phillip Frye presented to [facility] with progressive dyspnea over a 4-week period.  He had been coughing nonbloody clear sputum.  He had no fever or calf pain. He been using CPAP on a regular basis for obstructive sleep apnea.  He was referred to the emergency department at [facility] with mild hypoxemia.  Although the study was somewhat limited, a CT angiogram of the thorax revealed segmental and subsegmental size pulmonary emboli in the lower lungs of both lungs bilaterally. Those results are detailed below.   At the same time, a bilateral duplex venous Doppler failed to reveal any evidence of deep vein thrombosis involving the right or left lower extremity.  At the time of his initial presentation, laboratory studies were obtained for factor V Leiden gene mutation, prothrombin 20210A  and lupus anticoagulant. Those results were negative. Thereafter, he was treated with rivaroxaban by protocol. He discontinued treatment on his own after nearly 6 months. Because of chronic knee pain and bilateral "foot pain," a repeat duplex venous Doppler was performed on March 21, 2017.  Although portions of the examination were limited due to body habitus, there was no evidence of deep vein thrombosis on either the right or left lower extremity.  There is no cystic structure found in the popliteal fossa. There is a small amount of fluid seen anterior and lateral to the right knee.  It is with this background he presents now for further diagnostic and therapeutic recommendations in the setting of a previous bilateral pulmonary embolism treated with nearly 6 months of anticoagulation with rivaroxaban, to exclude an underlying thrombophilia either hereditary or acquired as outlined above.  Current Status:     Phillip Frye returns today for review of numerous labs that were drawn at his last office visit. I reviewed with him today that factor VIII, protein S, protein C, antithrombin III, serum homocysteine, and  antiphospholipid antibody profile were all within normal limits. His d-dimer was slightly elevated at 0.7 today. However he continues to smoke cigars every other day, drinks 3-4 alcoholic beverages once a month and is not compliant with his CPAP for his obstructive sleep apnea.  He reports that he was not using his CPAP when he developed the pulmonary emboli initially.  He is seeing someone tomorrow to have it adjusted.  He is trying to lose weight.  He reports he has lost about 20 pounds.  He knows that he needs to quit smoking, manage his diabetes, and lose weight.  He also voiced understanding that not using the CPAP machine increases his risk of developing  blood clots.  He reports that he took himself off of the Xarelto because of concern that this medication "causes people to die.  It also could cause  cancer" I have reviewed with him that I am not aware of any data that shows an increased risk of cancer.  It is also likely that individuals who are on this medication for blood clots have other life-threatening illnesses.  He reports he did not have any side effects with it.  He is willing to take aspirin daily.  We talked about the fact that he has multiple provoking factors for recurring blood clots.  He has, however, gone over 5 years without any additional clots.   Past Medical History:  Diagnosis Date  . Arthritis   . Diabetes mellitus without complication ([place])   . Dysrhythmia    unknown type  . GERD (gastroesophageal reflux disease)   . Hypertension    MD stopped BP med 1 yr ago due to normal BP  . Obesity   . Pulmonary embolism, bilateral ([place]) 09/2012   on xarelto  . Shortness of breath    with exertion  . Sleep apnea    uses c pap    Past Surgical History:  Procedure Laterality Date  . BREATH TEK H PYLORI N/A 12/28/2012   Procedure: BREATH TEK H PYLORI;  Lukins: Gayland Curry, MD;  Location: [facility];  Service: General;  Laterality: N/A;  . HIP SURGERY Bilateral    to "shave gthe bones"  . LAPAROSCOPIC GASTRIC SLEEVE RESECTION N/A 05/31/2013   Procedure: LAPAROSCOPIC GASTRIC SLEEVE RESECTION;  Speece: Gayland Curry, MD;  Location: [facility];  Service: General;  Laterality: N/A;   Family History  Problem Relation Age of Onset  . Cirrhosis Father   . Diabetes Other   . Diabetes Other   . Emphysema Paternal Grandfather        smoked  Mother: Age 78 years: No major medical problems Father: Deceased, alcoholic cirrhosis Brothers (7): MVA/throat cancer Sisters (6): DM/?cancer  Social History   Socioeconomic History  . Marital status: Single    Spouse name: Not on file  . Number of children: 0  . Years of education: Not on file  . Highest education level: Not on file  Occupational History  . Not on file  Social Needs  . Financial resource strain: Not on file    . Food insecurity:    Worry: Not on file    Inability: Not on file  . Transportation needs:    Medical: Not on file    Non-medical: Not on file  Tobacco Use  . Smoking status: Current Every Day Smoker    Packs/day: 1.00    Years: 6.00    Pack years: 6.00    Types: Cigars  . Smokeless tobacco: Never Used  Substance and Sexual Activity  . Alcohol use: Yes    Comment: occasional  . Drug use: No  . Sexual activity: Not on file  Lifestyle  . Physical activity:    Days per week: Not on file    Minutes per session: Not on file  . Stress: Not on file  Relationships  . Social connections:    Talks on phone: Not on file    Gets together: Not on file    Attends religious service: Not on file    Active member of club or organization: Not on file    Attends meetings of clubs or organizations: Not on file    Relationship  status: Not on file  . Intimate partner violence:    Fear of current or ex partner: Not on file    Emotionally abused: Not on file    Physically abused: Not on file    Forced sexual activity: Not on file  Other Topics Concern  . Not on file  Social History Narrative   Lives alone.  Works at [employer].  [place] is single. He has no children He does not smoke cigarettes. He smokes cigars at least every other day.  He has no significant use of pipe or chewing tobacco. His alcohol intake in the past was heavy. He currently drinks vodka once monthly. He reports no recreational drug use.  Transfusion History: No prior transfusion  Exposure History: He reports no known exposure to toxic chemicals, radiation, or pesticides.  Allergies:  He has no known medical allergies He has no food allergies He has nonspecific seasonal allergies  Current Outpatient Medications on File Prior to Visit  Medication Sig  . amLODipine (NORVASC) 5 MG tablet Take 1 tablet (5 mg total) by mouth daily.  Marland Kitchen atorvastatin (LIPITOR) 10 MG tablet Take 1 tablet (10 mg total) by mouth  daily at 6 PM.  . blood glucose meter kit and supplies Dispense based on patient and insurance preference. Use up to four times daily as directed. (FOR ICD-10 E10.9, E11.9).  Marland Kitchen Blood Glucose Monitoring Suppl (FREESTYLE LITE) DEVI   . glucose blood test strip Up to 3 times per day - DM2 with hyperglycemia. Uncontrolled.  . Insulin Glargine (BASAGLAR KWIKPEN) 100 UNIT/ML SOPN Inject 0.5 mLs (50 Units total) into the skin daily.  . Insulin Pen Needle (PEN NEEDLES) 32G X 4 MM MISC 1 application by Does not apply route daily.  . Lancets (FREESTYLE) lancets   . levocetirizine (XYZAL) 5 MG tablet Take 1 tablet (5 mg total) by mouth every evening.  . metFORMIN (GLUCOPHAGE) 1000 MG tablet Take 1 tablet (1,000 mg total) by mouth 2 (two) times daily with a meal.  . [DISCONTINUED] metoprolol (LOPRESSOR) 50 MG tablet Take 50 mg by mouth 2 (two) times daily.     No current facility-administered medications on file prior to visit.     Review of Systems: Constitutional: No fever, sweats, or shaking chills. No appetite deficit.  Voluntary weight loss. Pulmonary: No unusual cough, sore throat, or orthopnea; active tobacco dependence; DOE; sleep apnea compliant with CPAP.Marland Kitchen Gastrointestinal: No indigestion, dysphagia, abdominal pain, diarrhea, or constipation.  No change in bowel habits. Genitourinary: No urinary frequency, urgency, hematuria, or dysuria; chronic nocturia. Musculoskeletal: No new arthralgias or myalgias; bilateral knee and foot pain; no joint swelling or instability. Psychological: No anxiety, depression, or mood changes; no mental health illnesses. Neurological: No dizziness, lightheadedness, syncope, or near syncopal episodes;   Physical Examination: Vital Signs: Body surface area is 2.85 meters squared.  Vitals:   01/26/18 1403  BP: (!) 134/95  Pulse: 84  Resp: 18  Temp: 98.8 F (37.1 C)  SpO2: 97%    Filed Weights   01/26/18 1403  Weight: (!) 353 lb 4.8 oz (160.3 kg)  ECOG  PERFORMANCE STATUS: 0 Constitutional:  Michaela Ackman is a fully nourished and developed African-American albeit overweight.  He looks age appropriate.  He is friendly and cooperative without respiratory compromise at rest. Skin: No rashes, scaling, dryness, jaundice, or itching. Respiratory/chest: Thorax is symmetrical.  Breath sounds are clear to auscultation and percussion.  Normal excursion and respiratory effort. Back: Symmetric without deformity or tenderness. Cardiovascular: Heart  rate and rhythm are regular. Psychological:  He is oriented to person, place, and time; normal affect, memory, and cognition. Neurological: There are no gross neurologic deficits.  Laboratory Results: I have reviewed the data as listed:  Ref Range & Units 3d ago (01/15/18) 37moago (12/15/17) 137mogo (11/30/17) 63m863moo (11/30/17)  WBC Count 4.0 - 10.5 K/uL 6.6   6.3    RBC 4.22 - 5.81 MIL/uL 4.61   4.83    Hemoglobin 13.0 - 17.0 g/dL 13.9  14.6  14.5  16.7   HCT 39.0 - 52.0 % 42.9  43.0  43.0  49.0   MCV 80.0 - 100.0 fL 93.1   89.0    MCH 26.0 - 34.0 pg 30.2   30.0    MCHC 30.0 - 36.0 g/dL 32.4   33.7    RDW 11.5 - 15.5 % 12.2   11.2Low     Platelet Count 150 - 400 K/uL 274   262    nRBC 0.0 - 0.2 % 0.0   0.0 CM   Neutrophils Relative % % 49      Neutro Abs 1.7 - 7.7 K/uL 3.2      Lymphocytes Relative % 41      Lymphs Abs 0.7 - 4.0 K/uL 2.7      Monocytes Relative % 9      Monocytes Absolute 0.1 - 1.0 K/uL 0.6      Eosinophils Relative % 1      Eosinophils Absolute 0.0 - 0.5 K/uL 0.0      Basophils Relative % 0      Basophils Absolute 0.0 - 0.1 K/uL 0.0      Immature Granulocytes % 0      Abs Immature Granulocytes 0.00 - 0.07 K/uL 0.01        Ref Range & Units 11d ago (01/07/18) 63mo37mo (12/15/17) 63mo 56mo(12/04/17) 63mo a12mo10/20/19)  Glucose 65 - 99 mg/dL 128High   343High  R 523High Panic  CM 674High Panic  R, CM  BUN 6 - 24 mg/dL 7  13 R 18  14 R  Creatinine, Ser 0.76 - 1.27 mg/dL 0.95   0.90 R 1.26  1.37High  R  GFR calc non Af Amer >59 mL/min/1.73 94   67  59Low  R  GFR calc Af Amer >59 mL/min/1.73 108   77  >60 R, CM  BUN/Creatinine Ratio 9 - 20 7Low    14    Sodium 134 - 144 mmol/L 142  136 R 131Low   127Low  R  Potassium 3.5 - 5.2 mmol/L 4.5  4.0 R 4.6  4.9 R  Chloride 96 - 106 mmol/L 103  98 R 91Low   89Low  R  CO2 20 - 29 mmol/L '23   23  27 '$ R  Calcium 8.7 - 10.2 mg/dL 9.7   9.8  9.6 R  Total Protein 6.0 - 8.5 g/dL 6.9      Albumin 3.5 - 5.5 g/dL 4.3      Globulin, Total 1.5 - 4.5 g/dL 2.6      Albumin/Globulin Ratio 1.2 - 2.2 1.7      Bilirubin Total 0.0 - 1.2 mg/dL 0.4      Alkaline Phosphatase 39 - 117 IU/L 86      AST 0 - 40 IU/L 15      ALT 0 - 44 IU/L 24           Diagnostic/Imaging Studies: January 20, 2018 Bilateral  duplex venous Doppler Final Interpretation: Right: There is no evidence of deep vein thrombosis in the lower extremity. However, portions of this examination were limited- see technologist comments above. No cystic structure found in the popliteal fossa. Moderate amount of fluid seen anterior and  lateral to right knee. Left: No evidence of common femoral vein obstruction. Electronically signed by Servando Snare on 03/23/2017 at 2:07:33 PM.  September 22, 2012 Bilateral Lower Extremity Venous Duplex No evidence of deep vein thrombosis involving the right lower extremity. No evidence of deep vein thrombosis involving the left lower extremity. Other specific details can be found in the table(s) above.  Gae Gallop [id number]  September 22, 2012 CT ANGIOGRAPHY CHEST  Technique:  Multidetector CT imaging of the chest using the standard protocol during bolus administration of intravenous contrast. Multiplanar reconstructed images including MIPs were obtained and reviewed to evaluate the vascular anatomy.  Contrast: 115m OMNIPAQUE IOHEXOL 350 MG/ML SOLN  Comparison: Chest CT  03/05/2006.  Findings:  Mediastinum: Study is limited by considerable respiratory motion. Despite these limitations, there appear to be multiple segmental and subsegmental sized filling defects in the pulmonary arteries of the lower lobes of the lungs bilaterally, compatible with pulmonary embolism.  No larger central or lobar sized filling defect is noted. Heart size is mildly enlarged. There is no significant pericardial fluid, thickening or pericardial calcification. No pathologically enlarged mediastinal or hilar lymph nodes. Esophagus is unremarkable in appearance.  Lungs/Pleura: No acute consolidative airspace disease.  No pleural effusions.  No definite suspicious appearing pulmonary nodules or masses are identified at this time.  Upper Abdomen: Unremarkable.  Musculoskeletal: There are no aggressive appearing lytic or blastic lesions noted in the visualized portions of the skeleton.  IMPRESSION: 1.  Although the examination is slightly limited, the study is positive for segmental and subsegmental sized pulmonary emboli in the lower lobes of the lungs bilaterally. 2.  Mild cardiomegaly.  Critical Value/emergent results were called by telephone at the time of interpretation on 09/22/2012 at 03:03 AM to Dr. OMarnette Burgess who verbally acknowledged these results.  Original Report Authenticated By: DVinnie Langton M.D.   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   Assessment/ Plan:  1. Prior pulmonary embolism 05/23/2012, treated with 6 months of Xarelto. - patient took himself off the Xarelto, and admits to smoking, as well as not using his CPAP prior  to event.  Negative hypercoag work up.  (Negative for factor V Leiden gene mutation, prothrombin gene mutation, lupus anticoagulant, were not detected.  Factor VIII, protein S, protein C, antithrombin III, serum homocysteine, antiphospholipid antibody profile all within normal limits with a mildly elevated  d-dimer of 0.7) .  Discussed with Dr. KIrene Limbo Advised patient to take 2 low dose (aka baby) aspirin daily.  Strongly encouraged him to lose weight, quit smoking, cut back on alcohol intake and use his CPAP to reduce risk factors.   We do not need to see him back unless he develops new clots or additional problems arise.  This note was dictated using voice activated technology/software.  Unfortunately, typographical errors are not uncommon, and transcription is subject to mistakes and regrettably misinterpretation.  If necessary, clarification of the above information can be discussed with me at any time.  FOLLOW UP: AS DIRECTED   cc:  Phillip Ray, MD   Burns Spain, NP-C, [place]  Hematology/Oncology [facility] [address] [place], [place] [address] Office: [phone] GSUP: [phone]

## 2018-01-27 ENCOUNTER — Ambulatory Visit (INDEPENDENT_AMBULATORY_CARE_PROVIDER_SITE_OTHER): Payer: Self-pay | Admitting: Neurology

## 2018-01-27 ENCOUNTER — Encounter: Payer: Self-pay | Admitting: Neurology

## 2018-01-27 VITALS — BP 142/95 | HR 85 | Ht 74.0 in | Wt 357.0 lb

## 2018-01-27 DIAGNOSIS — Z789 Other specified health status: Secondary | ICD-10-CM

## 2018-01-27 DIAGNOSIS — R351 Nocturia: Secondary | ICD-10-CM

## 2018-01-27 DIAGNOSIS — G4719 Other hypersomnia: Secondary | ICD-10-CM

## 2018-01-27 DIAGNOSIS — R51 Headache: Secondary | ICD-10-CM

## 2018-01-27 DIAGNOSIS — Z820 Family history of epilepsy and other diseases of the nervous system: Secondary | ICD-10-CM

## 2018-01-27 DIAGNOSIS — G4733 Obstructive sleep apnea (adult) (pediatric): Secondary | ICD-10-CM

## 2018-01-27 DIAGNOSIS — R519 Headache, unspecified: Secondary | ICD-10-CM

## 2018-01-27 DIAGNOSIS — Z6841 Body Mass Index (BMI) 40.0 and over, adult: Secondary | ICD-10-CM

## 2018-01-27 NOTE — Progress Notes (Signed)
Subjective:    Patient ID: Phillip Frye is a 47 y.o. male.  HPI     Phillip Age, MD, PhD [facility] [address], [address] [place], [place] [address]  Dear Dr. Carlota Frye,   I saw your patient, Phillip Frye, upon your kind request, in my sleep clinic today for initial consultation of his sleep disorder, in particular, re-evaluation of his prior diagnosis of obstructive sleep apnea. The patient is unaccompanied today. As you know, Phillip Frye is a 47 year old right-handed gentleman with an underlying medical history of hypertension, dysrhythmia, diabetes, arthritis, reflux disease, history of pulmonary embolism bilaterally, and morbid obesity with a BMI of over 45 status post gastric sleeve, who was previously diagnosed with obstructive sleep apnea. Sleep study testing was several years ago, and I reviewed his sleep study report from 04/04/2006. He was diagnosed with severe sleep apnea with an AHI of 81 per hour. Lowest oxygen saturation was 75%. Of note, he was on supplemental oxygen at the time. He is not on CPAP therapy any longer, reports having difficulty tolerating the full facemask and he would pull the mask off at night inadvertently. He estimates that he used CPAP for a few years, has not been on it for the past 7-8 years. He was on supplemental oxygen at night but has not been on oxygen for the past 3 or 4 years he estimates. He is currently not working, but is soon starting a new job at [employer], will be working 6 PM to 6 AM, 12 hour shifts. His current bedtime is between 7 and 9 PM, rise time is around 2:30 AM. He has significant nocturia about 5 times per average night and has woken up with the occasional morning headache, his sister has sleep apnea. He lives with his sister, his brother and sister's son. He is not a cigarette smoker, smokes cigars. He drinks alcohol rarely, estimates about 2 alcoholic beverages per month, does not typically drink any  caffeine on a day-to-day basis. I reviewed your office note from 01/19/2018. His Epworth sleepiness score is 12 out of 24 today, fatigue score is 29 out of 63. He would be willing to get reevaluated with a sleep study but is not sure that he will be able to sleep enough and is not sure if he would be able to tolerate CPAP this time around, he is interested in pursuing surgical consultation and also learn about the [facility] device.  His Past Medical History Is Significant For: Past Medical History:  Diagnosis Date  . Arthritis   . Diabetes mellitus without complication ([place])   . Dysrhythmia    unknown type  . GERD (gastroesophageal reflux disease)   . Hypertension    MD stopped BP med 1 yr ago due to normal BP  . Obesity   . Pulmonary embolism, bilateral ([place]) 09/2012   on xarelto  . Shortness of breath    with exertion  . Sleep apnea    uses c pap     His Past Surgical History Is Significant For: Past Surgical History:  Procedure Laterality Date  . BREATH TEK H PYLORI N/A 12/28/2012   Procedure: BREATH TEK H PYLORI;  Phillip Frye: Phillip Curry, MD;  Location: [facility];  Service: General;  Laterality: N/A;  . HIP SURGERY Bilateral    to "shave gthe bones"  . LAPAROSCOPIC GASTRIC SLEEVE RESECTION N/A 05/31/2013   Procedure: LAPAROSCOPIC GASTRIC SLEEVE RESECTION;  Foisy: Phillip Curry, MD;  Location:  [facility];  Service: General;  Laterality: N/A;    His Family History Is Significant For: Family History  Problem Relation Frye of Onset  . Cirrhosis Father   . Diabetes Other   . Diabetes Other   . Emphysema Paternal Grandfather        smoked    His Social History Is Significant For: Social History   Socioeconomic History  . Marital status: Single    Spouse name: Not on file  . Number of children: 0  . Years of education: Not on file  . Highest education level: Not on file  Occupational History  . Not on file  Social Needs  . Financial resource strain: Not on file  . Food  insecurity:    Worry: Not on file    Inability: Not on file  . Transportation needs:    Medical: Not on file    Non-medical: Not on file  Tobacco Use  . Smoking status: Current Every Day Smoker    Packs/day: 1.00    Years: 6.00    Pack years: 6.00    Types: Cigars  . Smokeless tobacco: Never Used  Substance and Sexual Activity  . Alcohol use: Yes    Comment: occasional  . Drug use: No  . Sexual activity: Not on file  Lifestyle  . Physical activity:    Days per week: Not on file    Minutes per session: Not on file  . Stress: Not on file  Relationships  . Social connections:    Talks on phone: Not on file    Gets together: Not on file    Attends religious service: Not on file    Active member of club or organization: Not on file    Attends meetings of clubs or organizations: Not on file    Relationship status: Not on file  Other Topics Concern  . Not on file  Social History Narrative   Lives alone.  Works at [employer].    His Allergies Are:  No Known Allergies:   His Current Medications Are:  Outpatient Encounter Medications as of 01/27/2018  Medication Sig  . amLODipine (NORVASC) 5 MG tablet Take 1 tablet (5 mg total) by mouth daily.  Marland Kitchen atorvastatin (LIPITOR) 10 MG tablet Take 1 tablet (10 mg total) by mouth daily at 6 PM.  . blood glucose meter kit and supplies Dispense based on patient and insurance preference. Use up to four times daily as directed. (FOR ICD-10 E10.9, E11.9).  Marland Kitchen Blood Glucose Monitoring Suppl (FREESTYLE LITE) DEVI   . glucose blood test strip Up to 3 times per day - DM2 with hyperglycemia. Uncontrolled.  . Insulin Glargine (BASAGLAR KWIKPEN) 100 UNIT/ML SOPN Inject 0.5 mLs (50 Units total) into the skin daily.  . Insulin Pen Needle (PEN NEEDLES) 32G X 4 MM MISC 1 application by Does not apply route daily.  . Lancets (FREESTYLE) lancets   . levocetirizine (XYZAL) 5 MG tablet Take 1 tablet (5 mg total) by mouth every evening.  . metFORMIN  (GLUCOPHAGE) 1000 MG tablet Take 1 tablet (1,000 mg total) by mouth 2 (two) times daily with a meal.  . [DISCONTINUED] metoprolol (LOPRESSOR) 50 MG tablet Take 50 mg by mouth 2 (two) times daily.     No facility-administered encounter medications on file as of 01/27/2018.   :  Review of Systems:  Out of a complete 14 point review of systems, all are reviewed and negative with the exception of these symptoms as listed  below:  Review of Systems  Neurological:       Pt presents today to discuss his sleep. Pt has had a sleep study in the past but could not tolerate cpap. Pt is interested in the [place] procedure but does not have an ENT. Pt does endorse snoring.  Epworth Sleepiness Scale 0= would never doze 1= slight chance of dozing 2= moderate chance of dozing 3= high chance of dozing  Sitting and reading: 2 Watching TV: 1 Sitting inactive in a public place (ex. Theater or meeting): 1 As a passenger in a car for an hour without a break: 1 Lying down to rest in the afternoon: 2 Sitting and talking to someone: 1 Sitting quietly after lunch (no alcohol): 2 In a car, while stopped in traffic: 2 Total: 12     Objective:  Neurological Exam  Physical Exam Physical Examination:   Vitals:   01/27/18 1545  BP: (!) 142/95  Pulse: 85    General Examination: The patient is a very pleasant 47 y.o. male in no acute distress. He appears well-developed and well-nourished and well groomed.   HEENT: Normocephalic, atraumatic, pupils are equal, round and reactive to light and accommodation. Funduscopic exam is normal with sharp disc margins noted. Extraocular tracking is good without limitation to gaze excursion or nystagmus noted. Normal smooth pursuit is noted. Hearing is grossly intact. Tympanic membranes are clear bilaterally. Face is symmetric with normal facial animation and normal facial sensation. Speech is clear with no dysarthria noted. There is no hypophonia. There is no lip,  neck/head, jaw or voice tremor. Neck is supple with full range of passive and active motion. There are no carotid bruits on auscultation. Oropharynx exam reveals: mild mouth dryness, adequate dental hygiene and marked airway crowding, due to larger tongue, thicker soft palate, large uvula, tonsils in place. Mallampati is class III. Neck circumference is 21-5/8 inches. He has no significant overbite. Some missing teeth. Tongue protrudes centrally and palate elevates symmetrically. Nasal inspection reveals no significant nasal mucosal bogginess or redness and no septal deviation.   Chest: Clear to auscultation without wheezing, rhonchi or crackles noted.  Heart: S1+S2+0, regular and normal without murmurs, rubs or gallops noted.   Abdomen: Soft, non-tender and non-distended with normal bowel sounds appreciated on auscultation.  Extremities: There is no pitting edema in the distal lower extremities bilaterally.   Skin: Warm and dry without trophic changes noted.  Musculoskeletal: exam reveals no obvious joint deformities, tenderness or joint swelling or erythema.   Neurologically:  Mental status: The patient is awake, alert and oriented in all 4 spheres. His immediate and remote memory, attention, language skills and fund of knowledge are appropriate. There is no evidence of aphasia, agnosia, apraxia or anomia. Speech is clear with normal prosody and enunciation. Thought process is linear. Mood is normal and affect is normal.  Cranial nerves II - XII are as described above under HEENT exam. In addition: shoulder shrug is normal with equal shoulder height noted. Motor exam: Normal bulk, strength and tone is noted. There is no drift, tremor or rebound. Romberg is negative. Fine motor skills and coordination: intact with normal finger taps, normal hand movements, normal rapid alternating patting, normal foot taps and normal foot agility.  Cerebellar testing: No dysmetria or intention tremor on finger to  nose testing. Heel to shin is unremarkable bilaterally. There is no truncal or gait ataxia.  Sensory exam: intact to light touch in the upper and lower extremities.  Gait, station and balance:  He stands easily. No veering to one side is noted. No leaning to one side is noted. Posture is Frye-appropriate and stance is narrow based. Gait shows normal stride length and normal pace. No problems turning are noted. Tandem walk is challenging but doable.   Assessment and Plan:  In summary, Phillip Frye is a very pleasant 47 y.o.-year old male with an underlying medical history of hypertension, dysrhythmia, diabetes, arthritis, reflux disease, history of pulmonary embolism bilaterally, and morbid obesity with a BMI of over 37, who presents for reevaluation of his prior diagnosis of severe obstructive sleep apnea. He has had some weight fluctuation, weight in 2008 was around 330, currently higher, but he has had significant weight fluctuation in the interim. He would benefit from reevaluation with a sleep study.  I had a long chat with the patient about my findings and the diagnosis of OSA, its prognosis and treatment options. We talked about medical treatments, surgical interventions and non-pharmacological approaches. I explained in particular the risks and ramifications of untreated moderate to severe OSA, especially with respect to developing cardiovascular disease down the Road, including congestive heart failure, difficult to treat hypertension, cardiac arrhythmias, or stroke. Even type 2 diabetes has, in part, been linked to untreated OSA. Symptoms of untreated OSA include daytime sleepiness, memory problems, mood irritability and mood disorder such as depression and anxiety, lack of energy, as well as recurrent headaches, especially morning headaches. We talked about trying to maintain a healthy lifestyle in general, as well as the importance of weight control. I encouraged the patient to eat healthy, exercise  daily and keep well hydrated, to keep a scheduled bedtime and wake time routine, to not skip any meals and eat healthy snacks in between meals. I advised the patient not to drive when feeling sleepy. I recommended the following at this time: sleep study with potential positive airway pressure titration. (We will score hypopneas at 4%).   I explained the sleep test procedure to the patient and also outlined possible surgical and non-surgical treatment options of OSA, including the use of a custom-made dental device (which would require a referral to a specialist dentist or oral Bauernfeind), upper airway surgical options, such as pillar implants, radiofrequency surgery, tongue base surgery, and UPPP (which would involve a referral to an ENT [place]). Rarely, jaw surgery such as mandibular advancement may be considered.  I also explained the CPAP treatment option to the patient, who indicated that he would be willing to try CPAP if the need arises. I explained the importance of being compliant with PAP treatment, not only for insurance purposes but primarily to improve His symptoms, and for the patient's long term health benefit, including to reduce His cardiovascular risks. I answered all his questions today and the patient was in agreement. I plan to see him back after the sleep study is completed and encouraged him to call with any interim questions, concerns, problems or updates.   Thank you very much for allowing me to participate in the care of this nice patient. If I can be of any further assistance to you please do not hesitate to call me at [phone].  Sincerely,   Phillip Age, MD, PhD

## 2018-01-27 NOTE — Patient Instructions (Signed)

## 2018-03-05 ENCOUNTER — Ambulatory Visit (INDEPENDENT_AMBULATORY_CARE_PROVIDER_SITE_OTHER): Payer: Medicare Other | Admitting: Neurology

## 2018-03-05 DIAGNOSIS — Z789 Other specified health status: Secondary | ICD-10-CM

## 2018-03-05 DIAGNOSIS — R51 Headache: Secondary | ICD-10-CM

## 2018-03-05 DIAGNOSIS — Z820 Family history of epilepsy and other diseases of the nervous system: Secondary | ICD-10-CM

## 2018-03-05 DIAGNOSIS — Z6841 Body Mass Index (BMI) 40.0 and over, adult: Secondary | ICD-10-CM

## 2018-03-05 DIAGNOSIS — R519 Headache, unspecified: Secondary | ICD-10-CM

## 2018-03-05 DIAGNOSIS — G4719 Other hypersomnia: Secondary | ICD-10-CM

## 2018-03-05 DIAGNOSIS — G4733 Obstructive sleep apnea (adult) (pediatric): Secondary | ICD-10-CM

## 2018-03-05 DIAGNOSIS — R351 Nocturia: Secondary | ICD-10-CM

## 2018-03-11 ENCOUNTER — Telehealth: Payer: Self-pay

## 2018-03-11 NOTE — Telephone Encounter (Signed)
-----   Message from Huston Foley, MD sent at 03/11/2018  8:44 AM EST ----- Patient referred by Dr. Neva Seat, seen by me on 01/27/18, split night sleep study on 03/05/18. Please call and notify patient that the recent sleep study confirmed the diagnosis of severe OSA. He did very well with CPAP during the study with significant improvement of the respiratory events. Therefore, I would like start the patient on CPAP therapy at home by prescribing a machine for home use. I placed the order in the chart.  Please advise patient that we need a follow up appointment with either myself or one of our nurse practitioners in about 10 weeks post set-up to check for how the patient is feeling and how well the patient is using the machine, etc. Please go ahead and schedule the appointment, while you have the patient on the phone and make sure patient understands the importance of keeping this window for the FU appointment, as it is often an insurance requirement. Failing to adhere to this may result in losing coverage for sleep apnea treatment, at which point most patients are left with a choice of returning the machine or paying out of pocket (and we want neither of this to happen!).  Please re-enforce the importance of compliance with treatment and the need for [place] to monitor compliance data - again an insurance requirement and usually a good feedback for the patient as far as how they are doing.  Also remind patient, that any PAP machine or mask issues should be first addressed with the DME company, who provided the machine/mask.  Please ask if patient has a preference regarding DME company, may depend on the insurance too.  Please arrange for CPAP set up at home through a DME company of patient's choice.  Once you have spoken to the patient you can close the phone encounter. Please fax/route report to referring provider, thanks,   Huston Foley, MD, PhD [facility])

## 2018-03-11 NOTE — Telephone Encounter (Signed)
Patient returning your call.    Please call again

## 2018-03-11 NOTE — Addendum Note (Signed)
Addended by: Huston Foley on: 03/11/2018 08:44 AM   Modules accepted: Orders

## 2018-03-11 NOTE — Telephone Encounter (Signed)
I called pt. I advised pt that Dr. Frances Furbish reviewed their sleep study results and found that pt pt has severe osa but did well with the cpap during his latest sleep study. Dr. Frances Furbish recommends that pt start a cpap at home. I reviewed PAP compliance expectations with the pt. Pt is agreeable to starting a CPAP. I advised pt that an order will be sent to a DME, [facility], and [facility] will call the pt within about one week after they file with the pt's insurance. [facility] will show the pt how to use the machine, fit for masks, and troubleshoot the CPAP if needed. A follow up appt was made for insurance purposes with Dr. Frances Furbish on 05/27/18 at 8:30am. Pt verbalized understanding to arrive 15 minutes early and bring their CPAP. A letter with all of this information in it will be mailed to the pt as a reminder. I verified with the pt that the address we have on file is correct. Pt verbalized understanding of results. Pt had no questions at this time but was encouraged to call back if questions arise. I have sent the order to [facility] and have received confirmation that they have received the order.

## 2018-03-11 NOTE — Procedures (Signed)
PATIENT'S NAME:  Phillip Frye, Phillip Frye DOB:      06-11-1970      MR#:    [id number]     DATE OF RECORDING: 03/05/2018 REFERRING M.D.:  [name], MD Study Performed:  Split-Night Titration Study HISTORY: 48 year old man with an underlying medical history of hypertension, dysrhythmia, diabetes, arthritis, reflux disease, history of pulmonary embolism bilaterally, and morbid obesity with a BMI of over 45, status post gastric sleeve, who presents for re-evaluation of his OSA. He is not on CPAP therapy any longer, reports having difficulty tolerating the full facemask. He was at one point also on supplemental oxygen at night. The patient endorsed the Epworth Sleepiness Scale at 12 points. The patient's weight 357 pounds with a height of 74 (inches), resulting in a BMI of 45.8 kg/m2. The patient's neck circumference measured 21.6 inches.  CURRENT MEDICATIONS: Norvasc, Lipitor, Freestyle Lite, Glucose test strips, Basaglar Kwikpen, Xyzal, Glucophage  PROCEDURE:  This is a multichannel digital polysomnogram utilizing the Somnostar 11.2 system.  Electrodes and sensors were applied and monitored per AASM Specifications.   EEG, EOG, Chin and Limb EMG, were sampled at 200 Hz.  ECG, Snore and Nasal Pressure, Thermal Airflow, Respiratory Effort, CPAP Flow and Pressure, Oximetry was sampled at 50 Hz. Digital video and audio were recorded.      BASELINE STUDY WITHOUT CPAP RESULTS:  Lights Out was at 21:56 and Lights On at 04:59 for the night, split start 00:00. Total recording time (TRT) was 93.5, with a total sleep time (TST) of 69.5 minutes.   The patient's sleep latency was 20 minutes.  REM sleep was absent. The sleep efficiency was 74.3 %.    SLEEP ARCHITECTURE: WASO (Wake after sleep onset) was 4.5 minutes, Stage N1 was 4.5 minutes, Stage N2 was 54 minutes, Stage N3 was 11 minutes and Stage R (REM sleep) was 0 minutes.  The percentages were Stage N1 6.5%, Stage N2 77.7%, which is increased, Stage N3 15.8% and Stage  R (REM sleep) was absent. The arousals were noted as: 3 were spontaneous, 0 were associated with PLMs, 98 were associated with respiratory events.  RESPIRATORY ANALYSIS:  There were a total of 116 respiratory events:  106 obstructive apneas, 0 central apneas and 0 mixed apneas with a total of 106 apneas and an apnea index (AI) of 91.5. There were 10 hypopneas with a hypopnea index of 8.6. The patient also had 0 respiratory event related arousals (RERAs).  Snoring was noted.     The total APNEA/HYPOPNEA INDEX (AHI) was 100.1/hour and the total RESPIRATORY DISTURBANCE INDEX was 100.1 /hour.  0 events occurred in REM sleep and 20 events in NREM. The REM AHI was 0, /hour versus a non-REM AHI of 100.1 /hour. The patient spent 77 minutes sleep time in the supine position 315 minutes in non-supine. The supine AHI was 87.8 /hour versus a non-supine AHI of 105.3 /hour.  OXYGEN SATURATION & C02:  The wake baseline 02 saturation was 95%, with the lowest being 60%. Time spent below 89% saturation equaled 36 minutes.   PERIODIC LIMB MOVEMENTS: The patient had a total of 0 Periodic Limb Movements.  The Periodic Limb Movement (PLM) index was 0 /hour and the PLM Arousal index was 0 /hour.  Audio and video analysis did not show any abnormal or unusual movements, behaviors, phonations or vocalizations. The patient took one bathroom break for the night. Loud snoring was noted. The EKG was in keeping with normal sinus rhythm (NSR).   TITRATION STUDY WITH CPAP  RESULTS:   The patient was fitted with a large N20 nasal mask. CPAP was initiated at 6 cmH20 with heated humidity per AASM split night standards and pressure was advanced to 15 cm H20 because of hypopneas, apneas and desaturations. At a PAP pressure of 15 cmH20, there was a reduction of the AHI to 0.3/hour, with brief O2 nadir of 80% and non-supine REM sleep achieved.   Total recording time (TRT) was 330 minutes, with a total sleep time (TST) of 322.5 minutes. The  patient's sleep latency was 5.5 minutes. REM latency was 21.5 minutes.  The sleep efficiency was 97.7 %.    SLEEP ARCHITECTURE: Wake after sleep was 2 minutes, Stage N1 6.5 minutes, Stage N2 153 minutes, Stage N3 66.5 minutes and Stage R (REM sleep) 96.5 minutes. The percentages were: Stage N1 2.%, Stage N2 47.4%, Stage N3 20.6% and Stage R (REM sleep) 29.9%, which is increased, and in keeping with rebound. The arousals were noted as: 10 were spontaneous, 0 were associated with PLMs, 6 were associated with respiratory events.  RESPIRATORY ANALYSIS:  There were a total of 31 respiratory events: 0 obstructive apneas, 0 central apneas and 0 mixed apneas with a total of 0 apneas and an apnea index (AI) of 0. There were 31 hypopneas with a hypopnea index of 5.8 /hour. The patient also had 0 respiratory event related arousals (RERAs).      The total APNEA/HYPOPNEA INDEX  (AHI) was 5.8/hour and the total RESPIRATORY DISTURBANCE INDEX was 5.8 /hour.  19 events occurred in REM sleep and 12 events in NREM. The REM AHI was 11.8 /hour versus a non-REM AHI of 3.2 /hour. The patient spent 18% of total sleep time in the supine position. The supine AHI was 23.4 /hour, versus a non-supine AHI of 2.0/hour.  OXYGEN SATURATION & C02:  The wake baseline 02 saturation was 92%, with the lowest being 78%. Time spent below 89% saturation equaled 19 minutes.  PERIODIC LIMB MOVEMENTS: The patient had a total of 0 Periodic Limb Movements. The Periodic Limb Movement (PLM) index was 0 /hour and the PLM Arousal index was 0 /hour.  Post-study, the patient indicated that sleep was better than usual.  POLYSOMNOGRAPHY IMPRESSION :   1. Obstructive Sleep Apnea (OSA)  RECOMMENDATIONS:  1. This patient has severe obstructive sleep apnea and responded well on CPAP therapy. I will recommend a home treatment pressure of 16 cm, as he had a significant, albeit brief, desaturation to 80% during non-supine REM sleep on the final titration  pressure of 15 cm. The patient should be reminded to be fully compliant with PAP therapy to improve sleep related symptoms and decrease long term cardiovascular risks. Please note that untreated obstructive sleep apnea may carry additional perioperative morbidity. Patients with significant obstructive sleep apnea should receive perioperative PAP therapy and the surgeons and particularly the anesthesiologist should be informed of the diagnosis and the severity of the sleep disordered breathing.  2. The patient should be cautioned not to drive, work at heights, or operate dangerous or heavy equipment when tired or sleepy. Review and reiteration of good sleep hygiene measures should be pursued with any patient.  3. The patient will be seen in follow-up in the sleep clinic at [facility] for discussion of the test results, symptom and treatment compliance review, further management strategies, etc. The referring provider will be notified of the test results.  I certify that I have reviewed the entire raw data recording prior to the issuance of this report  in accordance with the Standards of Accreditation of the American Academy of Sleep Medicine (AASM)   Huston Foley, MD, PhD Diplomat, American Board of Neurology and Sleep Medicine (Neurology and Sleep Medicine)

## 2018-03-11 NOTE — Telephone Encounter (Signed)
I called pt to discuss his sleep study results but pt's VM is full. Will try again later.

## 2018-03-11 NOTE — Progress Notes (Signed)
Patient referred by Dr. Neva Seat, seen by me on 01/27/18, split night sleep study on 03/05/18. Please call and notify patient that the recent sleep study confirmed the diagnosis of severe OSA. He did very well with CPAP during the study with significant improvement of the respiratory events. Therefore, I would like start the patient on CPAP therapy at home by prescribing a machine for home use. I placed the order in the chart.  Please advise patient that we need a follow up appointment with either myself or one of our nurse practitioners in about 10 weeks post set-up to check for how the patient is feeling and how well the patient is using the machine, etc. Please go ahead and schedule the appointment, while you have the patient on the phone and make sure patient understands the importance of keeping this window for the FU appointment, as it is often an insurance requirement. Failing to adhere to this may result in losing coverage for sleep apnea treatment, at which point most patients are left with a choice of returning the machine or paying out of pocket (and we want neither of this to happen!).  Please re-enforce the importance of compliance with treatment and the need for [place] to monitor compliance data - again an insurance requirement and usually a good feedback for the patient as far as how they are doing.  Also remind patient, that any PAP machine or mask issues should be first addressed with the DME company, who provided the machine/mask.  Please ask if patient has a preference regarding DME company, may depend on the insurance too.  Please arrange for CPAP set up at home through a DME company of patient's choice.  Once you have spoken to the patient you can close the phone encounter. Please fax/route report to referring provider, thanks,   Huston Foley, MD, PhD [facility])

## 2018-03-27 ENCOUNTER — Other Ambulatory Visit: Payer: Self-pay | Admitting: Family Medicine

## 2018-04-14 NOTE — Telephone Encounter (Signed)
I called pt to r/s his appt in April with Dr. Frances Furbish since she will be OOO.  Pt has not started cpap but gets his new insurance at the end of the month. Pt is agreeable to pushing out his appt until 07/20/2018 at 10:30am. appt r/s. Pt verbalized understanding of new appt date and time.

## 2018-05-27 ENCOUNTER — Ambulatory Visit: Payer: Self-pay | Admitting: Neurology

## 2018-07-13 ENCOUNTER — Telehealth: Payer: Self-pay | Admitting: Neurology

## 2018-07-13 NOTE — Telephone Encounter (Signed)
Due to current COVID 19 pandemic, our office is severely reducing in office visits until further notice, in order to minimize the risk to our patients and healthcare providers.  °  °Called patient and confirmed a virtual visit for his 6/8 appointment. Patient verbalized understanding of the doxy.me process and I have sent him an e-mail with link and directions as well as my name and office number/hours for reference. Patient understands that he will receive a call from RN to update chart. °  °Pt understands that although there may be some limitations with this type of visit, we will take all precautions to reduce any security or privacy concerns.  Pt understands that this will be treated like an in office visit and we will file with pt's insurance, and there may be a patient responsible charge related to this service. °  °

## 2018-07-14 NOTE — Telephone Encounter (Signed)
FYI Patient rescheduled his appt for this Thursday, 6/4 at 3 pm.

## 2018-07-15 NOTE — Telephone Encounter (Signed)
Received this notice from [facility]: "We received orders back in Feb. but he told us that he wanted to check on pricing and call us back. We did not hear back from [place]. Looks like he still has his unit from 2012."  I called pt. He has not started a new cpap. He would prefer to delay his appt until September. He will call [facility] today. I will also ask [facility] to reach out to him again. He is ready to start cpap. Pt verbalized understanding of new appt date and time.

## 2018-07-15 NOTE — Telephone Encounter (Signed)
I called pt to update his chart. No answer, left a message asking him to call me back. 

## 2018-07-16 ENCOUNTER — Ambulatory Visit: Payer: Medicare Other | Admitting: Neurology

## 2018-07-16 DIAGNOSIS — G4733 Obstructive sleep apnea (adult) (pediatric): Secondary | ICD-10-CM | POA: Diagnosis not present

## 2018-07-20 ENCOUNTER — Other Ambulatory Visit: Payer: Self-pay

## 2018-07-20 ENCOUNTER — Telehealth (INDEPENDENT_AMBULATORY_CARE_PROVIDER_SITE_OTHER): Payer: BC Managed Care – PPO | Admitting: Family Medicine

## 2018-07-20 ENCOUNTER — Ambulatory Visit: Payer: Self-pay | Admitting: Neurology

## 2018-07-20 DIAGNOSIS — Z794 Long term (current) use of insulin: Secondary | ICD-10-CM

## 2018-07-20 DIAGNOSIS — E1165 Type 2 diabetes mellitus with hyperglycemia: Secondary | ICD-10-CM

## 2018-07-20 DIAGNOSIS — I10 Essential (primary) hypertension: Secondary | ICD-10-CM

## 2018-07-20 DIAGNOSIS — E785 Hyperlipidemia, unspecified: Secondary | ICD-10-CM

## 2018-07-20 DIAGNOSIS — G4733 Obstructive sleep apnea (adult) (pediatric): Secondary | ICD-10-CM

## 2018-07-20 MED ORDER — METFORMIN HCL 1000 MG PO TABS: 1000.0000 mg | ORAL_TABLET | Freq: Two times a day (BID) | ORAL | 1 refills | Status: DC

## 2018-07-20 MED ORDER — BASAGLAR KWIKPEN 100 UNIT/ML ~~LOC~~ SOPN: 50.0000 [IU] | PEN_INJECTOR | Freq: Every day | SUBCUTANEOUS | 2 refills | Status: DC

## 2018-07-20 NOTE — Progress Notes (Signed)
CC- med refill- Patient states he is doing well just need a refill on insulin and metformin. Patient blood sugar today was 140. Patient have not been monitoring his blood pressure.

## 2018-07-20 NOTE — Patient Instructions (Addendum)
No changes in medications for now.  Continue the insulin 50 units once per day, metformin 1000 mg twice per day.Please obtain a few fasting blood sugars in addition to 2 hours after meals and send me those numbers by a MyChart message if you can.  Fasting blood sugars give [place] an idea how well the insulin is working.  No dose changes for blood pressure medication, but please check your blood pressure outside of the office and send a few of those readings to me as well in the next few weeks.  If those are running over 140/90, would recommend we see you in the office to potentially adjust medicines.  Otherwise can follow-up in 3 months in person.  Continue Lipitor same dose for now, but can check levels on upcoming blood work.  Follow-up with me in 3 months, let me know if there are questions sooner.   Type 2 Diabetes Mellitus, Self Care, Adult When you have type 2 diabetes (type 2 diabetes mellitus), you must make sure your blood sugar (glucose) stays in a healthy range. You can do this with:  Nutrition.  Exercise.  Lifestyle changes.  Medicines or insulin, if needed.  Support from your doctors and others. How to stay aware of blood sugar   Check your blood sugar level every day, as often as told.  Have your A1c (hemoglobin A1c) level checked two or more times a year. Have it checked more often if your doctor tells you to. Your doctor will set personal treatment goals for you. Generally, you should have these blood sugar levels:  Before meals (preprandial): 80-130 mg/dL (4.4-7.2 mmol/L).  After meals (postprandial): below 180 mg/dL (10 mmol/L).  A1c level: less than 7%. How to manage high and low blood sugar Signs of high blood sugar High blood sugar is called hyperglycemia. Know the signs of high blood sugar. Signs may include:  Feeling: ? Thirsty. ? Hungry. ? Very tired.  Needing to pee (urinate) more than usual.  Blurry vision. Signs of low blood sugar Low blood sugar is  called hypoglycemia. This is when blood sugar is at or below 70 mg/dL (3.9 mmol/L). Signs may include:  Feeling: ? Hungry. ? Worried or nervous (anxious). ? Sweaty and clammy. ? Confused. ? Dizzy. ? Sleepy. ? Sick to your stomach (nauseous).  Having: ? A fast heartbeat. ? A headache. ? A change in your vision. ? Jerky movements that you cannot control (seizure). ? Tingling or no feeling (numbness) around your mouth, lips, or tongue.  Having trouble with: ? Moving (coordination). ? Sleeping. ? Passing out (fainting). ? Getting upset easily (irritability). Treating low blood sugar To treat low blood sugar, eat or drink something sugary right away. If you can think clearly and swallow safely, follow the 15:15 rule:  Take 15 grams of a fast-acting carb (carbohydrate). Talk with your doctor about how much you should take.  Some fast-acting carbs are: ? Sugar tablets (glucose pills). Take 3-4 pills. ? 6-8 pieces of hard candy. ? 4-6 oz (120-150 mL) of fruit juice. ? 4-6 oz (120-150 mL) of regular (not diet) soda. ? 1 Tbsp (15 mL) honey or sugar.  Check your blood sugar 15 minutes after you take the carb.  If your blood sugar is still at or below 70 mg/dL (3.9 mmol/L), take 15 grams of a carb again.  If your blood sugar does not go above 70 mg/dL (3.9 mmol/L) after 3 tries, get help right away.  After your blood sugar goes  back to normal, eat a meal or a snack within 1 hour. Treating very low blood sugar If your blood sugar is at or below 54 mg/dL (3 mmol/L), you have very low blood sugar (severe hypoglycemia). This is an emergency. Do not wait to see if the symptoms will go away. Get medical help right away. Call your local emergency services (911 in the [place] If you have very low blood sugar and you cannot eat or drink, you may need a glucagon shot (injection). A family member or friend should learn how to check your blood sugar and how to give you a glucagon shot. Ask your  doctor if you need to have a glucagon shot kit at home. Follow these instructions at home: Medicine  Take insulin and diabetes medicines as told.  If your doctor says you should take more or less insulin and medicines, do this exactly as told.  Do not run out of insulin or medicines. Having diabetes can raise your risk for other long-term conditions. These include heart disease and kidney disease. Your doctor may prescribe medicines to help you not have these problems. Food   Make healthy food choices. These include: ? Chicken, fish, egg whites, and beans. ? Oats, whole wheat, bulgur, brown rice, quinoa, and millet. ? Fresh fruits and vegetables. ? Low-fat dairy products. ? Nuts, avocado, olive oil, and canola oil.  Meet with a food specialist (dietitian). He or she can help you make an eating plan that is right for you.  Follow instructions from your doctor about what you cannot eat or drink.  Drink enough fluid to keep your pee (urine) pale yellow.  Keep track of carbs that you eat. Do this by reading food labels and learning food serving sizes.  Follow your sick day plan when you cannot eat or drink normally. Make this plan with your doctor so it is ready to use. Activity  Exercise 3 or more times a week.  Do not go more than 2 days without exercising.  Talk with your doctor before you start a new exercise. Your doctor may need to tell you to change: ? How much insulin or medicines you take. ? How much food you eat. Lifestyle  Do not use any tobacco products. These include cigarettes, chewing tobacco, and e-cigarettes. If you need help quitting, ask your doctor.  Ask your doctor how much alcohol is safe for you.  Learn to deal with stress. If you need help with this, ask your doctor. Body care   Stay up to date with your shots (immunizations).  Have your eyes and feet checked by a doctor as often as told.  Check your skin and feet every day. Check for cuts,  bruises, redness, blisters, or sores.  Brush your teeth and gums two times a day. Floss one or more times a day.  Go to the dentist one or more times every 6 months.  Stay at a healthy weight. General instructions  Take over-the-counter and prescription medicines only as told by your doctor.  Share your diabetes care plan with: ? Your work or school. ? People you live with.  Carry a card or wear jewelry that says you have diabetes.  Keep all follow-up visits as told by your doctor. This is important. Questions to ask your doctor  Do I need to meet with a diabetes educator?  Where can I find a support group for people with diabetes? Where to find more information To learn more about diabetes, visit:  American Diabetes Association: www.diabetes.org  American Association of Diabetes Educators: www.diabeteseducator.org Summary  When you have type 2 diabetes, you must make sure your blood sugar (glucose) stays in a healthy range.  Check your blood sugar every day, as often as told.  Having diabetes can raise your risk for other conditions. Your doctor may prescribe medicines to help you not have these problems.  Keep all follow-up visits as told by your doctor. This is important. This information is not intended to replace advice given to you by your health care provider. Make sure you discuss any questions you have with your health care provider. Document Released: 05/22/2015 Document Revised: 07/21/2017 Document Reviewed: 03/03/2015 Elsevier Interactive Patient Education  [employer].    If you have lab work done today you will be contacted with your lab results within the next 2 weeks.  If you have not heard from [place] then please contact us. The fastest way to get your results is to register for My Chart.   IF you received an x-ray today, you will receive an invoice from [facility]. Please contact [facility] at [phone] with questions or  concerns regarding your invoice.   IF you received labwork today, you will receive an invoice from [place] [facility]. Please contact [facility] at [phone] with questions or concerns regarding your invoice.   Our billing staff will not be able to assist you with questions regarding bills from these companies.  You will be contacted with the lab results as soon as they are available. The fastest way to get your results is to activate your My Chart account. Instructions are located on the last page of this paperwork. If you have not heard from [place] regarding the results in 2 weeks, please contact this office.

## 2018-07-20 NOTE — Addendum Note (Signed)
Addended by: Norton Blizzard R on: 07/21/5070 02:59 PM   Modules accepted: Orders

## 2018-07-20 NOTE — Progress Notes (Signed)
Virtual Visit via Telephone Note  I connected with Phillip Frye on 07/20/18 at 1:56 PM by telephone and verified that I am speaking with the correct person using two identifiers.   I discussed the limitations, risks, security and privacy concerns of performing an evaluation and management service by telephone and the availability of in person appointments. I also discussed with the patient that there may be a patient responsible charge related to this service. The patient expressed understanding and agreed to proceed, consent obtained  Chief complaint: Diabetes.    History of Present Illness: Phillip Frye is a 48 y.o. male  Diabetes: Complicated by hyperglycemia.  Last visit December 9.  Previously had increase metformin to 1000 mg twice daily and was tolerating that dose.  Had recommended continued dose of Basaglar at 50 units/day, but he he had been taking 30 units twice per day.  Low of 82 when discussed in December, symptomatic at that time. Hypoglycemia precautions were discussed, reviewed appropriate dosing of insulin, and back to 50 units once per day, plan for follow-up nutritionist, and Lipitor was started at 10 mg daily.  Plan to follow-up in 6 weeks.  This is his first visit back with me since December  Home readings:  Fasting: not checking 2hr postprandial:135-140's. Lowest 119, no symptomatic lows.  .  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 No coverage issues with current insurance.  No side effects with meds.  No recent missed doses.   Microalbumin: Normal ratio 12/04/2017 On Lipitor at a statin.  No current ACE/ARB Optho, foot exam, pneumovax: Ophthalmology ordered in November.  Foot exam 12/04/2017, Pneumovax 8/13/2014Has appt with optho scheduled this month - prior referral without insurance - had to reschedule  Lab Results  Component Value Date   HGBA1C 12.8 (A) 12/04/2017   HGBA1C 6.4 (H) 12/14/2012   Lab Results  Component Value  Date   LDLCALC 72 01/07/2018   CREATININE 0.95 01/07/2018     Obstructive sleep apnea: Followed by sleep center, [facility], Dr. Rexene Alberts.  Severe OSA on sleep study January 23, with AHI 100/h.  O2 sat nadir 60%, with 36 minutes below 89%.  Reduction of AHI to 0.3/h at a Pap pressure of 15 Still had CPAP unit from 2012.  Phone call from June 3, patient has not started new CPAP and plan to delay his appointment until September.  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   Abnormal EKG 12/23/2012   Obesity, Class III, BMI 40-49.9 (morbid obesity) ([place]) 12/11/2012   SOB (shortness of breath) 09/22/2012   Pulmonary embolism, bilateral ([place]) 09/22/2012   Diabetes mellitus without complication ([place])    DENTAL PAIN 03/31/2008   CARPAL TUNNEL SYNDROME, BILATERAL 10/09/2007   ULNAR NEUROPATHY 10/09/2007   DENTAL CARIES 10/09/2007   GERD 10/09/2007   INGUINAL PAIN, BILATERAL 02/26/2007   KNEE PAIN, RIGHT 12/19/2006   FOOT PAIN, BILATERAL 12/19/2006   HYPERTENSION 09/18/2006   AVASCULAR NECROSIS 09/18/2006   Sleep apnea 09/18/2006   Past Medical History:  Diagnosis Date   Arthritis    Diabetes mellitus without complication ([place])    Dysrhythmia    unknown type   GERD (gastroesophageal reflux disease)    Hypertension    MD stopped BP med 1 yr ago due to normal BP  Obesity    Pulmonary embolism, bilateral ([place]) 09/2012   on xarelto   Shortness of breath    with exertion   Sleep apnea    uses c pap    Past Surgical History:  Procedure Laterality Date   BREATH TEK H PYLORI N/A 12/28/2012   Procedure: BREATH TEK H PYLORI;  Sterkel: Gayland Curry, MD;  Location: [facility];  Service: General;  Laterality: N/A;   HIP SURGERY Bilateral    to "shave gthe bones"   LAPAROSCOPIC GASTRIC  SLEEVE RESECTION N/A 05/31/2013   Procedure: LAPAROSCOPIC GASTRIC SLEEVE RESECTION;  Farve: Gayland Curry, MD;  Location: [facility];  Service: General;  Laterality: N/A;   No Known Allergies Prior to Admission medications   Medication Sig Start Date End Date Taking? Authorizing Provider  amLODipine (NORVASC) 5 MG tablet Take 1 tablet (5 mg total) by mouth daily. 01/01/18  Yes Wendie Agreste, MD  atorvastatin (LIPITOR) 10 MG tablet Take 1 tablet (10 mg total) by mouth daily at 6 PM. 01/19/18  Yes Wendie Agreste, MD  blood glucose meter kit and supplies Dispense based on patient and insurance preference. Use up to four times daily as directed. (FOR ICD-10 E10.9, E11.9). 01/15/18  Yes Wendie Agreste, MD  Blood Glucose Monitoring Suppl (FREESTYLE LITE) DEVI  01/15/18  Yes [provider]  glucose blood test strip Up to 3 times per day - DM2 with hyperglycemia. Uncontrolled. 12/04/17  Yes Wendie Agreste, MD  Insulin Glargine (BASAGLAR KWIKPEN) 100 UNIT/ML SOPN Inject 0.5 mLs (50 Units total) into the skin daily. 01/19/18  Yes Wendie Agreste, MD  Insulin Pen Needle (PEN NEEDLES) 32G X 4 MM MISC 1 application by Does not apply route daily. 12/04/17  Yes Wendie Agreste, MD  Lancets (FREESTYLE) lancets  01/15/18  Yes [provider]  levocetirizine (XYZAL) 5 MG tablet Take 1 tablet (5 mg total) by mouth every evening. 08/23/17  Yes Dutch Quint B, FNP  metFORMIN (GLUCOPHAGE) 1000 MG tablet Take 1 tablet (1,000 mg total) by mouth 2 (two) times daily with a meal. 01/07/18  Yes Wendie Agreste, MD  metoprolol tartrate (LOPRESSOR) 50 MG tablet Take 50 mg by mouth 2 (two) times daily.   Yes [provider]   Social History   Socioeconomic History   Marital status: Single    Spouse name: Not on file   Number of children: 0   Years of education: Not on file   Highest education level: Not on file  Occupational History   Not on file  Social Needs   Financial  resource strain: Not on file   Food insecurity:    Worry: Not on file    Inability: Not on file   Transportation needs:    Medical: Not on file    Non-medical: Not on file  Tobacco Use   Smoking status: Current Every Day Smoker    Packs/day: 1.00    Years: 6.00    Pack years: 6.00    Types: Cigars   Smokeless tobacco: Never Used  Substance and Sexual Activity   Alcohol use: Yes    Comment: occasional   Drug use: No   Sexual activity: Not on file  Lifestyle   Physical activity:    Days per week: Not on file    Minutes per session: Not on file   Stress: Not on file  Relationships   Social connections:    Talks on phone: Not on file  Gets together: Not on file    Attends religious service: Not on file    Active member of club or organization: Not on file    Attends meetings of clubs or organizations: Not on file    Relationship status: Not on file   Intimate partner violence:    Fear of current or ex partner: Not on file    Emotionally abused: Not on file    Physically abused: Not on file    Forced sexual activity: Not on file  Other Topics Concern   Not on file  Social History Narrative   Lives alone.  Works at [employer].     Observations/Objective: Home glucose readings as noted above in HPI.  Normal speech, no distress, appropriate responses over phone with understanding expressed of plan and all questions answered  Assessment and Plan: Essential hypertension - Plan: Comprehensive metabolic panel  -No recent home readings, but tolerating current regimen.  No changes.  Asked that he check home blood pressure readings  Type 2 diabetes mellitus with hyperglycemia, with long-term current use of insulin ([place]) - Plan: Insulin Glargine (BASAGLAR KWIKPEN) 100 UNIT/ML SOPN, Hemoglobin A1c  -Uncontrolled by A1c in October, was lost to follow-up since December, but reports controlled home readings recently and tolerating current dose of Basaglar and  metformin.  Continue same dose, lab visit this week to check A1c, renal and liver functions.  Recheck 50-monthvisit.   -Asked that he also check fasting blood sugars in addition to 2-hour postprandials and send a record over my chart  Hyperlipidemia  -Repeat labs, tolerating Lipitor, continue same dose  OSA:  -Severe by testing in December, plan for follow-up appointment with sleep specialist but also noted recommendation to contact [facility] for equipment.  Follow Up Instructions:  Lab visit this week, then 345monthn office appt.   .  Patient Instructions   No changes in medications for now.  Continue the insulin 50 units once per day, metformin 1000 mg twice per day.Please obtain a few fasting blood sugars in addition to 2 hours after meals and send me those numbers by a MyChart message if you can.  Fasting blood sugars give usKorean idea how well the insulin is working.  No dose changes for blood pressure medication, but please check your blood pressure outside of the office and send a few of those readings to me as well in the next few weeks.  If those are running over 140/90, would recommend we see you in the office to potentially adjust medicines.  Otherwise can follow-up in 3 months in person.  Continue Lipitor same dose for now, but can check levels on upcoming blood work.  Follow-up with me in 3 months, let me know if there are questions sooner.   Type 2 Diabetes Mellitus, Self Care, Adult When you have type 2 diabetes (type 2 diabetes mellitus), you must make sure your blood sugar (glucose) stays in a healthy range. You can do this with:  Nutrition.  Exercise.  Lifestyle changes.  Medicines or insulin, if needed.  Support from your doctors and others. How to stay aware of blood sugar   Check your blood sugar level every day, as often as told.  Have your A1c (hemoglobin A1c) level checked two or more times a year. Have it checked more often if your doctor tells  you to. Your doctor will set personal treatment goals for you. Generally, you should have these blood sugar levels:  Before meals (preprandial):  80-130 mg/dL (4.4-7.2 mmol/L).  After meals (postprandial): below 180 mg/dL (10 mmol/L).  A1c level: less than 7%. How to manage high and low blood sugar Signs of high blood sugar High blood sugar is called hyperglycemia. Know the signs of high blood sugar. Signs may include:  Feeling: ? Thirsty. ? Hungry. ? Very tired.  Needing to pee (urinate) more than usual.  Blurry vision. Signs of low blood sugar Low blood sugar is called hypoglycemia. This is when blood sugar is at or below 70 mg/dL (3.9 mmol/L). Signs may include:  Feeling: ? Hungry. ? Worried or nervous (anxious). ? Sweaty and clammy. ? Confused. ? Dizzy. ? Sleepy. ? Sick to your stomach (nauseous).  Having: ? A fast heartbeat. ? A headache. ? A change in your vision. ? Jerky movements that you cannot control (seizure). ? Tingling or no feeling (numbness) around your mouth, lips, or tongue.  Having trouble with: ? Moving (coordination). ? Sleeping. ? Passing out (fainting). ? Getting upset easily (irritability). Treating low blood sugar To treat low blood sugar, eat or drink something sugary right away. If you can think clearly and swallow safely, follow the 15:15 rule:  Take 15 grams of a fast-acting carb (carbohydrate). Talk with your doctor about how much you should take.  Some fast-acting carbs are: ? Sugar tablets (glucose pills). Take 3-4 pills. ? 6-8 pieces of hard candy. ? 4-6 oz (120-150 mL) of fruit juice. ? 4-6 oz (120-150 mL) of regular (not diet) soda. ? 1 Tbsp (15 mL) honey or sugar.  Check your blood sugar 15 minutes after you take the carb.  If your blood sugar is still at or below 70 mg/dL (3.9 mmol/L), take 15 grams of a carb again.  If your blood sugar does not go above 70 mg/dL (3.9 mmol/L) after 3 tries, get help right away.  After  your blood sugar goes back to normal, eat a meal or a snack within 1 hour. Treating very low blood sugar If your blood sugar is at or below 54 mg/dL (3 mmol/L), you have very low blood sugar (severe hypoglycemia). This is an emergency. Do not wait to see if the symptoms will go away. Get medical help right away. Call your local emergency services (911 in the [place] If you have very low blood sugar and you cannot eat or drink, you may need a glucagon shot (injection). A family member or friend should learn how to check your blood sugar and how to give you a glucagon shot. Ask your doctor if you need to have a glucagon shot kit at home. Follow these instructions at home: Medicine  Take insulin and diabetes medicines as told.  If your doctor says you should take more or less insulin and medicines, do this exactly as told.  Do not run out of insulin or medicines. Having diabetes can raise your risk for other long-term conditions. These include heart disease and kidney disease. Your doctor may prescribe medicines to help you not have these problems. Food   Make healthy food choices. These include: ? Chicken, fish, egg whites, and beans. ? Oats, whole wheat, bulgur, brown rice, quinoa, and millet. ? Fresh fruits and vegetables. ? Low-fat dairy products. ? Nuts, avocado, olive oil, and canola oil.  Meet with a food specialist (dietitian). He or she can help you make an eating plan that is right for you.  Follow instructions from your doctor about what you cannot eat or drink.  Drink enough fluid to  keep your pee (urine) pale yellow.  Keep track of carbs that you eat. Do this by reading food labels and learning food serving sizes.  Follow your sick day plan when you cannot eat or drink normally. Make this plan with your doctor so it is ready to use. Activity  Exercise 3 or more times a week.  Do not go more than 2 days without exercising.  Talk with your doctor before you start a new  exercise. Your doctor may need to tell you to change: ? How much insulin or medicines you take. ? How much food you eat. Lifestyle  Do not use any tobacco products. These include cigarettes, chewing tobacco, and e-cigarettes. If you need help quitting, ask your doctor.  Ask your doctor how much alcohol is safe for you.  Learn to deal with stress. If you need help with this, ask your doctor. Body care   Stay up to date with your shots (immunizations).  Have your eyes and feet checked by a doctor as often as told.  Check your skin and feet every day. Check for cuts, bruises, redness, blisters, or sores.  Brush your teeth and gums two times a day. Floss one or more times a day.  Go to the dentist one or more times every 6 months.  Stay at a healthy weight. General instructions  Take over-the-counter and prescription medicines only as told by your doctor.  Share your diabetes care plan with: ? Your work or school. ? People you live with.  Carry a card or wear jewelry that says you have diabetes.  Keep all follow-up visits as told by your doctor. This is important. Questions to ask your doctor  Do I need to meet with a diabetes educator?  Where can I find a support group for people with diabetes? Where to find more information To learn more about diabetes, visit:  American Diabetes Association: www.diabetes.org  American Association of Diabetes Educators: www.diabeteseducator.org Summary  When you have type 2 diabetes, you must make sure your blood sugar (glucose) stays in a healthy range.  Check your blood sugar every day, as often as told.  Having diabetes can raise your risk for other conditions. Your doctor may prescribe medicines to help you not have these problems.  Keep all follow-up visits as told by your doctor. This is important. This information is not intended to replace advice given to you by your health care provider. Make sure you discuss any questions  you have with your health care provider. Document Released: 05/22/2015 Document Revised: 07/21/2017 Document Reviewed: 03/03/2015 Elsevier Interactive Patient Education  [employer].    If you have lab work done today you will be contacted with your lab results within the next 2 weeks.  If you have not heard from [place] then please contact us. The fastest way to get your results is to register for My Chart.   IF you received an x-ray today, you will receive an invoice from [facility]. Please contact [facility] at [phone] with questions or concerns regarding your invoice.   IF you received labwork today, you will receive an invoice from [facility]. Please contact [facility] at [phone] with questions or concerns regarding your invoice.   Our billing staff will not be able to assist you with questions regarding bills from these companies.  You will be contacted with the lab results as soon as they are available. The fastest way to get your results is to activate your My Chart  account. Instructions are located on the last page of this paperwork. If you have not heard from [place] regarding the results in 2 weeks, please contact this office.        I discussed the assessment and treatment plan with the patient. The patient was provided an opportunity to ask questions and all were answered. The patient agreed with the plan and demonstrated an understanding of the instructions.   The patient was advised to call back or seek an in-person evaluation if the symptoms worsen or if the condition fails to improve as anticipated.  I provided 10 minutes of non-face-to-face time during this encounter.  Signed,   [name], MD [facility].  07/20/18

## 2018-07-24 ENCOUNTER — Ambulatory Visit: Payer: Medicare Other

## 2018-08-03 DIAGNOSIS — H524 Presbyopia: Secondary | ICD-10-CM | POA: Diagnosis not present

## 2018-08-03 DIAGNOSIS — H40013 Open angle with borderline findings, low risk, bilateral: Secondary | ICD-10-CM | POA: Diagnosis not present

## 2018-08-03 DIAGNOSIS — H52223 Regular astigmatism, bilateral: Secondary | ICD-10-CM | POA: Diagnosis not present

## 2018-08-03 DIAGNOSIS — H5213 Myopia, bilateral: Secondary | ICD-10-CM | POA: Diagnosis not present

## 2018-08-03 DIAGNOSIS — E119 Type 2 diabetes mellitus without complications: Secondary | ICD-10-CM | POA: Diagnosis not present

## 2018-08-03 LAB — HM DIABETES EYE EXAM

## 2018-08-07 ENCOUNTER — Ambulatory Visit: Payer: Self-pay | Admitting: *Deleted

## 2018-08-07 NOTE — Telephone Encounter (Signed)
Message from Beverley Fiedler sent at 08/07/2018 3:18 PM EDT  Summary: Clinical Advice   Patient would like to speak with a nurse about recommendations for his nose being stopped up. Patient stated that on his sleep apnea machine hes still feeling "stopped up" and his nasal spray isnt helping. Please advise  ----- Message from Beverley Fiedler sent at 08/07/2018 3:17 PM EDT -----  Patient would like to speak with a nurse about recommendations for his nose being stopped up. Patient stated that on his sleep apnea machine hes still feeling "stopped up" and his nasal spray isnt helping. Please advise

## 2018-08-11 ENCOUNTER — Other Ambulatory Visit: Payer: Self-pay

## 2018-08-11 ENCOUNTER — Emergency Department (HOSPITAL_COMMUNITY): Payer: BC Managed Care – PPO

## 2018-08-11 ENCOUNTER — Emergency Department (HOSPITAL_COMMUNITY)
Admission: EM | Admit: 2018-08-11 | Discharge: 2018-08-12 | Disposition: A | Discharge status: Home / Self Care | Payer: BC Managed Care – PPO | Attending: Emergency Medicine | Admitting: Emergency Medicine

## 2018-08-11 ENCOUNTER — Encounter (HOSPITAL_COMMUNITY): Payer: Self-pay | Admitting: *Deleted

## 2018-08-11 DIAGNOSIS — I4891 Unspecified atrial fibrillation: Secondary | ICD-10-CM | POA: Diagnosis not present

## 2018-08-11 DIAGNOSIS — E119 Type 2 diabetes mellitus without complications: Secondary | ICD-10-CM | POA: Diagnosis not present

## 2018-08-11 DIAGNOSIS — Z79899 Other long term (current) drug therapy: Secondary | ICD-10-CM | POA: Insufficient documentation

## 2018-08-11 DIAGNOSIS — R0602 Shortness of breath: Secondary | ICD-10-CM | POA: Diagnosis not present

## 2018-08-11 DIAGNOSIS — F17210 Nicotine dependence, cigarettes, uncomplicated: Secondary | ICD-10-CM | POA: Diagnosis not present

## 2018-08-11 DIAGNOSIS — R079 Chest pain, unspecified: Secondary | ICD-10-CM | POA: Diagnosis not present

## 2018-08-11 LAB — BASIC METABOLIC PANEL
Anion gap: 9 (ref 5–15)
BUN: 13 mg/dL (ref 6–20)
CO2: 25 mmol/L (ref 22–32)
Calcium: 9.1 mg/dL (ref 8.9–10.3)
Chloride: 105 mmol/L (ref 98–111)
Creatinine, Ser: 1.32 mg/dL — ABNORMAL HIGH (ref 0.61–1.24)
GFR calc Af Amer: >60 mL/min (ref >60)
GFR calc non Af Amer: >60 mL/min (ref >60)
Glucose, Bld: 158 mg/dL — ABNORMAL HIGH (ref 70–99)
Potassium: 4.1 mmol/L (ref 3.5–5.1)
Sodium: 139 mmol/L (ref 135–145)

## 2018-08-11 LAB — CBC
HCT: 45.2 % (ref 39.0–52.0)
Hemoglobin: 14.6 g/dL (ref 13.0–17.0)
MCH: 29.9 pg (ref 26.0–34.0)
MCHC: 32.3 g/dL (ref 30.0–36.0)
MCV: 92.4 fL (ref 80.0–100.0)
Platelets: 363 10*3/uL (ref 150–400)
RBC: 4.89 MIL/uL (ref 4.22–5.81)
RDW: 12.8 % (ref 11.5–15.5)
WBC: 6.8 10*3/uL (ref 4.0–10.5)
nRBC: 0 % (ref 0.0–0.2)

## 2018-08-11 LAB — TROPONIN I (HIGH SENSITIVITY): Troponin I (High Sensitivity): 14 ng/L (ref <18)

## 2018-08-11 MED ORDER — SODIUM CHLORIDE 0.9% FLUSH
3.0000 mL | Freq: Once | INTRAVENOUS | Status: AC
  Administered 2018-08-12: 3 mL via INTRAVENOUS

## 2018-08-11 NOTE — ED Triage Notes (Signed)
Pt reports left side squeezing chest pain that started last night and reports sob. No cardiac hx. No acute distress is noted at triage.

## 2018-08-12 ENCOUNTER — Emergency Department (HOSPITAL_COMMUNITY): Payer: BC Managed Care – PPO

## 2018-08-12 DIAGNOSIS — R079 Chest pain, unspecified: Secondary | ICD-10-CM | POA: Diagnosis not present

## 2018-08-12 LAB — D-DIMER, QUANTITATIVE: D-Dimer, Quant: 0.88 ug/mL-FEU — ABNORMAL HIGH (ref 0.00–0.50)

## 2018-08-12 LAB — TROPONIN I (HIGH SENSITIVITY): Troponin I (High Sensitivity): 16 ng/L (ref <18)

## 2018-08-12 MED ORDER — APIXABAN 5 MG PO TABS: 5.0000 mg | ORAL_TABLET | Freq: Two times a day (BID) | ORAL | 0 refills | Status: DC

## 2018-08-12 MED ORDER — IOHEXOL 350 MG/ML SOLN
75.0000 mL | Freq: Once | INTRAVENOUS | Status: AC | PRN
  Administered 2018-08-12: 100 mL via INTRAVENOUS

## 2018-08-12 NOTE — ED Provider Notes (Signed)
[facility] EMERGENCY DEPARTMENT Provider Note   CSN: [id number] Arrival date & time: 08/11/18  2147     History   Chief Complaint Chief Complaint  Patient presents with  . Chest Pain    HPI Phillip Frye is a 47 y.o. male.     Patient presents to the emergency department with a chief complaint of chest pain.  He states that about 7PM (8 hours ago), he began having squeezing left-sided chest pain.  He reports associated shortness of breath.  Denies any nausea, vomiting, diaphoresis, radiating pain.  He has not taken anything for symptoms.  He states that his symptoms have improved, but he still consents mild pain now.  He does report a history of prior PE, but is no longer anticoagulated.  He denies any lower extremity pain or swelling.  Denies any recent travel or immobilization.  Denies any known exposures to Covid -19.  The history is provided by the patient. No language interpreter was used.    Past Medical History:  Diagnosis Date  . Arthritis   . Diabetes mellitus without complication ([place])   . Dysrhythmia    unknown type  . GERD (gastroesophageal reflux disease)   . Hypertension    MD stopped BP med 1 yr ago due to normal BP  . Obesity   . Pulmonary embolism, bilateral ([place]) 09/2012   on xarelto  . Shortness of breath    with exertion  . Sleep apnea    uses c pap     Patient Active Problem List   Diagnosis Date Noted  . Primary hypercoagulable state ([place]) 01/18/2018  . 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 Abnormal EKG 12/23/2012  . Obesity, Class III, BMI 40-49.9 (morbid obesity) ([place]) 12/11/2012  . SOB (shortness of breath) 09/22/2012  . Pulmonary embolism, bilateral ([place]) 09/22/2012  . Diabetes mellitus without complication ([place])   . DENTAL PAIN 03/31/2008  . CARPAL TUNNEL SYNDROME, BILATERAL 10/09/2007  . ULNAR NEUROPATHY 10/09/2007  . DENTAL CARIES 10/09/2007  . GERD 10/09/2007  . INGUINAL PAIN, BILATERAL 02/26/2007   . KNEE PAIN, RIGHT 12/19/2006  . FOOT PAIN, BILATERAL 12/19/2006  . HYPERTENSION 09/18/2006  . AVASCULAR NECROSIS 09/18/2006  . Sleep apnea 09/18/2006    Past Surgical History:  Procedure Laterality Date  . BREATH TEK H PYLORI N/A 12/28/2012   Procedure: BREATH TEK H PYLORI;  [place]: Gayland Curry, MD;  Location: [facility];  Service: General;  Laterality: N/A;  . HIP SURGERY Bilateral    to "shave gthe bones"  . LAPAROSCOPIC GASTRIC SLEEVE RESECTION N/A 05/31/2013   Procedure: LAPAROSCOPIC GASTRIC SLEEVE RESECTION;  [place]: Gayland Curry, MD;  Location: [facility];  Service: General;  Laterality: N/A;        Home Medications    Prior to Admission medications   Medication Sig Start Date End Date Taking? Authorizing Provider  amLODipine (NORVASC) 5 MG tablet Take 1 tablet (5 mg total) by mouth daily. 01/01/18   Wendie Agreste, MD  atorvastatin (LIPITOR) 10 MG tablet Take 1 tablet (10 mg total) by mouth daily at 6 PM. 01/19/18   Wendie Agreste, MD  blood glucose meter kit and supplies Dispense based on patient and insurance preference. Use up to four times daily as directed. (FOR ICD-10 E10.9, E11.9). 01/15/18   Wendie Agreste, MD  Blood Glucose Monitoring Suppl (FREESTYLE LITE) DEVI  01/15/18   [provider]  glucose blood test strip Up to 3 times per  day - DM2 with hyperglycemia. Uncontrolled. 12/04/17   Wendie Agreste, MD  Insulin Glargine (BASAGLAR KWIKPEN) 100 UNIT/ML SOPN Inject 0.5 mLs (50 Units total) into the skin daily. 07/20/18   Wendie Agreste, MD  Insulin Pen Needle (PEN NEEDLES) 32G X 4 MM MISC 1 application by Does not apply route daily. 12/04/17   Wendie Agreste, MD  Lancets (FREESTYLE) lancets  01/15/18   [provider]  levocetirizine (XYZAL) 5 MG tablet Take 1 tablet (5 mg total) by mouth every evening. 08/23/17   Kennyth Arnold, FNP  metFORMIN (GLUCOPHAGE) 1000 MG tablet Take 1 tablet (1,000 mg total) by mouth 2 (two) times daily  with a meal. 07/20/18   Wendie Agreste, MD  metoprolol tartrate (LOPRESSOR) 50 MG tablet Take 50 mg by mouth 2 (two) times daily.    [provider]    Family History Family History  Problem Relation Age of Onset  . Cirrhosis Father   . Diabetes Other   . Diabetes Other   . Emphysema Paternal Grandfather        smoked    Social History Social History   Tobacco Use  . Smoking status: Current Every Day Smoker    Packs/day: 1.00    Years: 6.00    Pack years: 6.00    Types: Cigars  . Smokeless tobacco: Never Used  Substance Use Topics  . Alcohol use: Yes    Comment: occasional  . Drug use: No     Allergies   Patient has no known allergies.   Review of Systems Review of Systems  All other systems reviewed and are negative.    Physical Exam Updated Vital Signs BP (!) 140/98 (BP Location: Right Arm)   Pulse 90   Temp 98.6 F (37 C) (Oral)   Resp 19   SpO2 100%   Physical Exam Vitals signs and nursing note reviewed.  Constitutional:      Appearance: He is well-developed.  HENT:     Head: Normocephalic and atraumatic.  Eyes:     Conjunctiva/sclera: Conjunctivae normal.  Neck:     Musculoskeletal: Neck supple.  Cardiovascular:     Rate and Rhythm: Normal rate and regular rhythm.     Heart sounds: No murmur.  Pulmonary:     Effort: Pulmonary effort is normal. No respiratory distress.     Breath sounds: Normal breath sounds.  Abdominal:     Palpations: Abdomen is soft.     Tenderness: There is no abdominal tenderness.  Skin:    General: Skin is warm and dry.  Neurological:     Mental Status: He is alert and oriented to person, place, and time.  Psychiatric:        Mood and Affect: Mood normal.        Behavior: Behavior normal.      ED Treatments / Results  Labs (all labs ordered are listed, but only abnormal results are displayed) Labs Reviewed  BASIC METABOLIC PANEL - Abnormal; Notable for the following components:      Result Value    Glucose, Bld 158 (*)    Creatinine, Ser 1.32 (*)    All other components within normal limits  CBC  TROPONIN I (HIGH SENSITIVITY)  TROPONIN I (HIGH SENSITIVITY)  D-DIMER, QUANTITATIVE (NOT AT [facility])    EKG EKG Interpretation  Date/Time:  Tuesday August 11 2018 22:13:49 EDT Ventricular Rate:  116 PR Interval:    QRS Duration: 78 QT Interval:  352 QTC  Calculation: 489 R Axis:   76 Text Interpretation:  Atrial fibrillation with rapid ventricular response with premature ventricular or aberrantly conducted complexes ST & T wave abnormality, consider inferolateral ischemia or digitalis effect Abnormal ECG When compared with ECG of 09/21/2012, Atrial fibrillation with rapid ventricular response has replaced Sinus rhythm ST-t abnormality is more pronounced - p[ossibly rate-related Confirmed by Delora Fuel ([id number]) on 08/11/2018 11:46:10 PM   Radiology Dg Chest 2 View  Result Date: 08/11/2018 CLINICAL DATA:  48 year old male with left side chest pain since last night, shortness of breath. Smoker. EXAM: CHEST - 2 VIEW COMPARISON:  Chest radiograph 11/30/2017 and earlier. FINDINGS: Widespread bilateral increased pulmonary interstitial markings appear chronic and stable since 2019. Cardiac and mediastinal contours remain within normal limits. Visualized tracheal air column is within normal limits. No pneumothorax, pleural effusion or acute pulmonary opacity. No acute osseous abnormality identified. Negative visible bowel gas pattern. IMPRESSION: No acute cardiopulmonary abnormality. Chronic pulmonary interstitial changes. Electronically Signed   By: Genevie Ann M.D.   On: 08/11/2018 22:46    Procedures Procedures (including critical care time)  Medications Ordered in ED Medications  sodium chloride flush (NS) 0.9 % injection 3 mL (has no administration in time range)     Initial Impression / Assessment and Plan / ED Course  I have reviewed the triage vital signs and the nursing notes.  Pertinent  labs & imaging results that were available during my care of the patient were reviewed by me and considered in my medical decision making (see chart for details).        Patient with history of prior PE, presents with chest pain and shortness of breath.  Symptoms started approximately 8 hours ago.  Initial high-sensitivity troponin is 14.  Repeat pending.  EKG shows afib with RVR.  Will repeat EKG now that his right has normalized.  Of note, heart rate in triage was 130.    Concern for both new onset afib and also PE.  CHADSVASC = 2.  Will need follow-up in afib clinic.  Currently NSR.  CT PE pending due to elevated d-dimer.  CT negative for PE.  Repeat troponin is 16 doubt ACS.  HEART score is 3.   Feels better now.  DC to home with Eliquis and afib clinic f/u.  Final Clinical Impressions(s) / ED Diagnoses   Final diagnoses:  Atrial fibrillation, unspecified type [facility])    ED Discharge Orders    None       Montine Circle, PA-C 60/45/40 [id number]    Delora Fuel, MD 91/47/82 [phone]

## 2018-08-12 NOTE — ED Notes (Signed)
Patient transported to CT 

## 2018-08-13 DIAGNOSIS — I480 Paroxysmal atrial fibrillation: Secondary | ICD-10-CM | POA: Diagnosis not present

## 2018-08-13 DIAGNOSIS — R0789 Other chest pain: Secondary | ICD-10-CM | POA: Diagnosis not present

## 2018-08-13 DIAGNOSIS — I10 Essential (primary) hypertension: Secondary | ICD-10-CM | POA: Diagnosis not present

## 2018-08-13 DIAGNOSIS — E119 Type 2 diabetes mellitus without complications: Secondary | ICD-10-CM | POA: Diagnosis not present

## 2018-08-14 ENCOUNTER — Ambulatory Visit: Payer: Medicare Other

## 2018-08-15 DIAGNOSIS — G4733 Obstructive sleep apnea (adult) (pediatric): Secondary | ICD-10-CM | POA: Diagnosis not present

## 2018-08-17 ENCOUNTER — Encounter: Payer: Self-pay | Admitting: *Deleted

## 2018-08-17 NOTE — Telephone Encounter (Signed)
My chart message sent.  Please schedule an appointment

## 2018-08-18 ENCOUNTER — Ambulatory Visit: Payer: Medicare Other

## 2018-08-21 ENCOUNTER — Ambulatory Visit (HOSPITAL_COMMUNITY): Payer: BC Managed Care – PPO | Admitting: Physician Assistant

## 2018-08-21 ENCOUNTER — Telehealth (HOSPITAL_COMMUNITY): Payer: Self-pay | Admitting: *Deleted

## 2018-08-21 NOTE — Telephone Encounter (Signed)
Pt followed up with Dr. Doylene Canard post ER

## 2018-08-21 NOTE — Telephone Encounter (Signed)
Tried to call patient to schedule appnt and mailbox was full 08/21/18

## 2018-09-15 DIAGNOSIS — G4733 Obstructive sleep apnea (adult) (pediatric): Secondary | ICD-10-CM | POA: Diagnosis not present

## 2018-09-18 ENCOUNTER — Other Ambulatory Visit: Payer: Self-pay

## 2018-09-18 ENCOUNTER — Emergency Department (HOSPITAL_COMMUNITY)
Admission: EM | Admit: 2018-09-18 | Discharge: 2018-09-18 | Disposition: A | Discharge status: Home / Self Care | Payer: No Typology Code available for payment source | Attending: Emergency Medicine | Admitting: Emergency Medicine

## 2018-09-18 ENCOUNTER — Emergency Department (HOSPITAL_COMMUNITY): Payer: No Typology Code available for payment source

## 2018-09-18 ENCOUNTER — Encounter (HOSPITAL_COMMUNITY): Payer: Self-pay

## 2018-09-18 DIAGNOSIS — Z794 Long term (current) use of insulin: Secondary | ICD-10-CM | POA: Diagnosis not present

## 2018-09-18 DIAGNOSIS — M25469 Effusion, unspecified knee: Secondary | ICD-10-CM

## 2018-09-18 DIAGNOSIS — E119 Type 2 diabetes mellitus without complications: Secondary | ICD-10-CM | POA: Diagnosis not present

## 2018-09-18 DIAGNOSIS — Z79899 Other long term (current) drug therapy: Secondary | ICD-10-CM | POA: Diagnosis not present

## 2018-09-18 DIAGNOSIS — F17290 Nicotine dependence, other tobacco product, uncomplicated: Secondary | ICD-10-CM | POA: Diagnosis not present

## 2018-09-18 DIAGNOSIS — Z7901 Long term (current) use of anticoagulants: Secondary | ICD-10-CM | POA: Diagnosis not present

## 2018-09-18 DIAGNOSIS — I10 Essential (primary) hypertension: Secondary | ICD-10-CM | POA: Insufficient documentation

## 2018-09-18 DIAGNOSIS — M25462 Effusion, left knee: Secondary | ICD-10-CM | POA: Diagnosis not present

## 2018-09-18 DIAGNOSIS — M25562 Pain in left knee: Secondary | ICD-10-CM | POA: Diagnosis not present

## 2018-09-18 MED ORDER — ACETAMINOPHEN 325 MG PO TABS
650.0000 mg | ORAL_TABLET | Freq: Once | ORAL | Status: AC
  Administered 2018-09-18: 650 mg via ORAL
  Filled 2018-09-18: qty 2

## 2018-09-18 MED ORDER — LIDOCAINE 5 % EX PTCH: 1.0000 | MEDICATED_PATCH | CUTANEOUS | 0 refills | Status: DC

## 2018-09-18 NOTE — ED Triage Notes (Addendum)
Patient c/o left knee pain and swelling.  Patient reports using a machine (cherry picker) at work and hit his left knee this morning.   Denies Falling. Denies LOC or head trauma.   8/10 throbbing pain in left knee.    A/ox4 Ambulatory from wheelchair to stretcher.

## 2018-09-18 NOTE — Discharge Instructions (Signed)
Wrap your left knee daily and also apply lido-derm patch to your left knee for comfort.  Follow instruction below.

## 2018-09-18 NOTE — ED Provider Notes (Signed)
[place] DEPT Provider Note   CSN: [id number] Arrival date & time: 09/18/18  0932     History   Chief Complaint Chief Complaint  Patient presents with  . Knee Pain    Left    HPI Phillip Frye is a 48 y.o. male.     The history is provided by the patient. No language interpreter was used.  Knee Pain Associated symptoms: no fever      48 year old male with history obesity, diabetes, hypertension, prior PE currently on Eliquis presenting complaining of left knee pain.  Patient states he was at work earlier this morning, when a heavy machine was dropped on a platform and he was standing.  The impact jolted him up and as he did last down he developed acute onset of left knee pain.  Described pain as a sharp sensation, now throbbing 8 out of 10 nonradiating.  He was able to walk, he denies falling onto the ground but he did scrape his right leg from the impact.  He does not think he has any broken bones but would like to have his knee checked out.  He denies any ankle or hip pain.  He is up-to-date with tetanus.  No specific treatment tried prior to arrival.  Past Medical History:  Diagnosis Date  . Arthritis   . Diabetes mellitus without complication ([place])   . Dysrhythmia    unknown type  . GERD (gastroesophageal reflux disease)   . Hypertension    MD stopped BP med 1 yr ago due to normal BP  . Obesity   . Pulmonary embolism, bilateral ([place]) 09/2012   on xarelto  . Shortness of breath    with exertion  . Sleep apnea    uses c pap     Patient Active Problem List   Diagnosis Date Noted  . Primary hypercoagulable state ([place]) 01/18/2018  . 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 Abnormal EKG 12/23/2012  . Obesity, Class III, BMI 40-49.9 (morbid obesity) ([place]) 12/11/2012  . SOB (shortness of breath) 09/22/2012  . Pulmonary embolism, bilateral ([place]) 09/22/2012  . Diabetes mellitus without complication ([place])   . DENTAL PAIN  03/31/2008  . CARPAL TUNNEL SYNDROME, BILATERAL 10/09/2007  . ULNAR NEUROPATHY 10/09/2007  . DENTAL CARIES 10/09/2007  . GERD 10/09/2007  . INGUINAL PAIN, BILATERAL 02/26/2007  . KNEE PAIN, RIGHT 12/19/2006  . FOOT PAIN, BILATERAL 12/19/2006  . HYPERTENSION 09/18/2006  . AVASCULAR NECROSIS 09/18/2006  . Sleep apnea 09/18/2006    Past Surgical History:  Procedure Laterality Date  . BREATH TEK H PYLORI N/A 12/28/2012   Procedure: BREATH TEK H PYLORI;  [place]: Gayland Curry, MD;  Location: [facility];  Service: General;  Laterality: N/A;  . HIP SURGERY Bilateral    to "shave gthe bones"  . LAPAROSCOPIC GASTRIC SLEEVE RESECTION N/A 05/31/2013   Procedure: LAPAROSCOPIC GASTRIC SLEEVE RESECTION;  [place]: Gayland Curry, MD;  Location: [facility];  Service: General;  Laterality: N/A;        Home Medications    Prior to Admission medications   Medication Sig Start Date End Date Taking? Authorizing Provider  amLODipine (NORVASC) 5 MG tablet Take 1 tablet (5 mg total) by mouth daily. Patient not taking: Reported on 08/12/2018 01/01/18   Wendie Agreste, MD  apixaban (ELIQUIS) 5 MG TABS tablet Take 1 tablet (5 mg total) by mouth 2 (two) times daily. 08/12/18 09/11/18  Montine Circle, PA-C  atorvastatin (LIPITOR) 10 MG tablet Take  1 tablet (10 mg total) by mouth daily at 6 PM. 01/19/18   Wendie Agreste, MD  blood glucose meter kit and supplies Dispense based on patient and insurance preference. Use up to four times daily as directed. (FOR ICD-10 E10.9, E11.9). 01/15/18   Wendie Agreste, MD  Blood Glucose Monitoring Suppl (FREESTYLE LITE) DEVI  01/15/18   [provider]  glucose blood test strip Up to 3 times per day - DM2 with hyperglycemia. Uncontrolled. 12/04/17   Wendie Agreste, MD  Insulin Glargine (BASAGLAR KWIKPEN) 100 UNIT/ML SOPN Inject 0.5 mLs (50 Units total) into the skin daily. 07/20/18   Wendie Agreste, MD  Insulin Pen Needle (PEN NEEDLES) 32G X 4 MM MISC 1  application by Does not apply route daily. 12/04/17   Wendie Agreste, MD  Lancets (FREESTYLE) lancets  01/15/18   [provider]  levocetirizine (XYZAL) 5 MG tablet Take 1 tablet (5 mg total) by mouth every evening. Patient not taking: Reported on 08/12/2018 08/23/17   Kennyth Arnold, FNP  metFORMIN (GLUCOPHAGE) 1000 MG tablet Take 1 tablet (1,000 mg total) by mouth 2 (two) times daily with a meal. 07/20/18   Wendie Agreste, MD    Family History Family History  Problem Relation Age of Onset  . Cirrhosis Father   . Diabetes Other   . Diabetes Other   . Emphysema Paternal Grandfather        smoked    Social History Social History   Tobacco Use  . Smoking status: Current Every Day Smoker    Packs/day: 1.00    Years: 6.00    Pack years: 6.00    Types: Cigars  . Smokeless tobacco: Never Used  Substance Use Topics  . Alcohol use: Yes    Comment: occasional  . Drug use: No     Allergies   Patient has no known allergies.   Review of Systems Review of Systems  Constitutional: Negative for fever.  Musculoskeletal: Positive for arthralgias.  Skin: Positive for wound.  Neurological: Negative for numbness.     Physical Exam Updated Vital Signs BP (!) 126/110 (BP Location: Left Arm)   Pulse (!) 111   Temp 98.2 F (36.8 C) (Oral)   Resp 18   SpO2 97%   Physical Exam Vitals signs and nursing note reviewed.  Constitutional:      General: He is not in acute distress.    Appearance: He is well-developed. He is obese.  HENT:     Head: Atraumatic.  Eyes:     Conjunctiva/sclera: Conjunctivae normal.  Neck:     Musculoskeletal: Neck supple.  Musculoskeletal:        General: Swelling (Left knee: Tenderness to the anterior knee about the patella.  Patella is located.  Swelling noted to the suprapatellar region on palpation.  Increasing pain with knee flexion.  No obvious deformity.) present.  Skin:    Findings: No rash.     Comments: Skin abrasion noted to  right mid shin mild tender to palpation not actively bleeding.  Neurological:     Mental Status: He is alert.      ED Treatments / Results  Labs (all labs ordered are listed, but only abnormal results are displayed) Labs Reviewed - No data to display  EKG None  Radiology Dg Knee Complete 4 Views Left  Result Date: 09/18/2018 CLINICAL DATA:  Hit LEFT knee on a cherry picker at work this morning, moderate to severe LEFT knee pain  with soft tissue swelling EXAM: LEFT KNEE - COMPLETE 4+ VIEW COMPARISON:  None FINDINGS: Low normal osseous mineralization. Mild medial compartment joint space narrowing. No acute fracture, dislocation, or bone destruction. Small to moderate LEFT knee joint effusion. IMPRESSION: Degenerative changes of LEFT knee with small to moderate knee joint effusion. No acute fracture or dislocation identified. Electronically Signed   By: Lavonia Dana M.D.   On: 09/18/2018 08:58    Procedures Procedures (including critical care time)  Medications Ordered in ED Medications  acetaminophen (TYLENOL) tablet 650 mg (650 mg Oral Given 09/18/18 0834)     Initial Impression / Assessment and Plan / ED Course  I have reviewed the triage vital signs and the nursing notes.  Pertinent labs & imaging results that were available during my care of the patient were reviewed by me and considered in my medical decision making (see chart for details).       BP (!) 126/110 (BP Location: Left Arm)   Pulse (!) 111   Temp 98.2 F (36.8 C) (Oral)   Resp 18   SpO2 97%    Final Clinical Impressions(s) / ED Diagnoses   Final diagnoses:  Suprapatellar effusion of knee    ED Discharge Orders         Ordered    lidocaine (LIDODERM) 5 %  Every 24 hours     09/18/18 0914         8:18 AM Patient here with left knee injury when a heavy machine landed on the platform that he was standing causing a jolt.  No direct impact to the L knee.  On exam, it appears pt has a suprapatella  effusion along with tenderness to L patella region.  Will obtain xray.  Tylenol for pain. ICE pack given.   9:05 AM X-ray of the left knee shows degenerative changes about the left knee with small to moderate knee joint effusion.  No acute fracture or dislocation identified.  To clarify, no direct impact was to his left knee according to the patient even though note mention that his knee was struck by a cherry picker.   Encouraged rice therapy, use lido-derm patch as needed for pain.  Pt does walk with a cane.  Work note provided.    Domenic Moras, PA-C 09/18/18 8921    Virgel Manifold, MD 09/19/18 [phone]

## 2018-09-18 NOTE — ED Notes (Signed)
Patient transported to imaging.

## 2018-09-22 ENCOUNTER — Other Ambulatory Visit: Payer: Self-pay | Admitting: Family Medicine

## 2018-09-22 NOTE — Telephone Encounter (Signed)
Forwarding medication refill request to clinical pool for review. 

## 2018-10-16 DIAGNOSIS — G4733 Obstructive sleep apnea (adult) (pediatric): Secondary | ICD-10-CM | POA: Diagnosis not present

## 2018-10-17 ENCOUNTER — Other Ambulatory Visit: Payer: Self-pay | Admitting: Family Medicine

## 2018-10-17 NOTE — Telephone Encounter (Signed)
Forwarding medication refill request to the clinical pool for review. 

## 2018-10-21 ENCOUNTER — Ambulatory Visit: Payer: Medicare Other | Admitting: Family Medicine

## 2018-10-22 ENCOUNTER — Encounter: Payer: Self-pay | Admitting: Family Medicine

## 2018-10-22 ENCOUNTER — Other Ambulatory Visit: Payer: Self-pay

## 2018-10-22 ENCOUNTER — Ambulatory Visit (INDEPENDENT_AMBULATORY_CARE_PROVIDER_SITE_OTHER): Payer: BC Managed Care – PPO | Admitting: Family Medicine

## 2018-10-22 VITALS — BP 119/72 | HR 98 | Temp 98.9°F | Resp 18 | Wt 357.0 lb

## 2018-10-22 DIAGNOSIS — E785 Hyperlipidemia, unspecified: Secondary | ICD-10-CM | POA: Diagnosis not present

## 2018-10-22 DIAGNOSIS — Z9189 Other specified personal risk factors, not elsewhere classified: Secondary | ICD-10-CM

## 2018-10-22 DIAGNOSIS — Z794 Long term (current) use of insulin: Secondary | ICD-10-CM | POA: Diagnosis not present

## 2018-10-22 DIAGNOSIS — I10 Essential (primary) hypertension: Secondary | ICD-10-CM | POA: Diagnosis not present

## 2018-10-22 DIAGNOSIS — Z23 Encounter for immunization: Secondary | ICD-10-CM | POA: Diagnosis not present

## 2018-10-22 DIAGNOSIS — Z114 Encounter for screening for human immunodeficiency virus [HIV]: Secondary | ICD-10-CM

## 2018-10-22 DIAGNOSIS — E1165 Type 2 diabetes mellitus with hyperglycemia: Secondary | ICD-10-CM | POA: Diagnosis not present

## 2018-10-22 LAB — POCT GLYCOSYLATED HEMOGLOBIN (HGB A1C): Hemoglobin A1C: 10.5 % — AB (ref 4.0–5.6)

## 2018-10-22 LAB — GLUCOSE, POCT (MANUAL RESULT ENTRY): POC Glucose: 294 mg/dl — AB (ref 70–99)

## 2018-10-22 MED ORDER — ATORVASTATIN CALCIUM 10 MG PO TABS: 10.0000 mg | ORAL_TABLET | Freq: Every day | ORAL | 1 refills | Status: DC

## 2018-10-22 MED ORDER — PEN NEEDLES 32G X 4 MM MISC: 1.0000 "application " | Freq: Every day | 2 refills | Status: DC

## 2018-10-22 MED ORDER — GLUCOSE BLOOD VI STRP: ORAL_STRIP | 5 refills | Status: DC

## 2018-10-22 MED ORDER — BASAGLAR KWIKPEN 100 UNIT/ML ~~LOC~~ SOPN: 55.0000 [IU] | PEN_INJECTOR | Freq: Every day | SUBCUTANEOUS | 2 refills | Status: DC

## 2018-10-22 MED ORDER — METFORMIN HCL 1000 MG PO TABS: 1000.0000 mg | ORAL_TABLET | Freq: Two times a day (BID) | ORAL | 1 refills | Status: DC

## 2018-10-22 NOTE — Patient Instructions (Addendum)
Unfortunately blood sugar is still uncontrolled.  Increase Basaglar to 55 units/day for now, continue metformin same dose.  I will refer you to endocrinology to help manage blood sugar and diabetes.  Follow-up with cardiology regarding atrial fibrillation, but you should be on Eliquis twice per day unless they have advised you otherwise.  Recheck with me in 6 months, but should be seen by endocrinology within the next 4 to 6 weeks.  Return to the clinic or go to the nearest emergency room if any of your symptoms worsen or new symptoms occur.   If you have lab work done today you will be contacted with your lab results within the next 2 weeks.  If you have not heard from [place] then please contact us. The fastest way to get your results is to register for My Chart.   IF you received an x-ray today, you will receive an invoice from [facility]. Please contact [facility] at [phone] with questions or concerns regarding your invoice.   IF you received labwork today, you will receive an invoice from [place]. Please contact [facility] at [phone] with questions or concerns regarding your invoice.   Our billing staff will not be able to assist you with questions regarding bills from these companies.  You will be contacted with the lab results as soon as they are available. The fastest way to get your results is to activate your My Chart account. Instructions are located on the last page of this paperwork. If you have not heard from [place] regarding the results in 2 weeks, please contact this office.

## 2018-10-22 NOTE — Progress Notes (Signed)
Subjective:    Patient ID: Phillip Frye, male    DOB: 1970/05/29, 48 y.o.   MRN: [id number]  HPI Phillip Frye is a 48 y.o. male Presents today for: Chief Complaint  Patient presents with  . Diabetes    here for his 6 month f/u for diabetes   Diabetes: Complicated by hyperglycemia.  Last in person office visit in January 19, 2018, with planned 6-week follow-up, not seen until June 8 telemedicine visit. Concerns discussed in December regarding dosing of insulin.  Borderline blood glucose in office. He was decreased to 50 units of Basaglar per day, continued metformin 1000 mg twice daily.   Telemedicine visit June 8.  He was not checking fasting, but 2-hour postprandials 135-140s with a low of 119.  Microalbumin: Normal ratio in October 2019 Optho, foot exam, pneumovax:up to date.  Fasting - 130's usually - low 100.  No symptomatic lows.  2hr PP - 190-200.  High of 260 on Monday.  Was taking metformin and insulin every other day a few weeks ago when injured - concerned about too much med, but never went low. Past 2 weeks - taking basaglar 50 units daily, metformin 1062m BID.  On lipitor  No new side effects of meds.   Decreased exercise recently until injury at work.  Wt Readings from Last 3 Encounters:  10/22/18 (!) 357 lb (161.9 kg)  01/27/18 (!) 357 lb (161.9 kg)  01/26/18 (!) 353 lb 4.8 oz (160.3 kg)    Lab Results  Component Value Date   HGBA1C 12.8 (A) 12/04/2017   HGBA1C 6.4 (H) 12/14/2012   Lab Results  Component Value Date   LDLCALC 72 01/07/2018   CREATININE 1.32 (H) 08/11/2018   Obstructive sleep apnea: Severe obstructive sleep apnea with AHI 100 in January.  Reduction 10.3 with APAP pressure of 15.  Discussed in June, recommended he call advanced home care to restart CPAP.   Atrial fibrillation: ER visit June 30.  Initial EKG with A. fib then reverted to sinus rhythm, suspected paroxysmal A. fib.  Started on Eliquis and referred to atrial fibrillation  clinic. Cardiologist Dr. KDoylene Canard  - saw in August.  On Eliquis - only taking once per day. No new bleeding or issues - thinks he misread bottle.    Patient Active Problem List   Diagnosis Date Noted  . Primary hypercoagulable state ([facility] 01/18/2018  . 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 Abnormal EKG 12/23/2012  . Obesity, Class III, BMI 40-49.9 (morbid obesity) ([facility] 12/11/2012  . SOB (shortness of breath) 09/22/2012  . Pulmonary embolism, bilateral ([facility] 09/22/2012  . Diabetes mellitus without complication ([facility]   . DENTAL PAIN 03/31/2008  . CARPAL TUNNEL SYNDROME, BILATERAL 10/09/2007  . ULNAR NEUROPATHY 10/09/2007  . DENTAL CARIES 10/09/2007  . GERD 10/09/2007  . INGUINAL PAIN, BILATERAL 02/26/2007  . KNEE PAIN, RIGHT 12/19/2006  . FOOT PAIN, BILATERAL 12/19/2006  . HYPERTENSION 09/18/2006  . AVASCULAR NECROSIS 09/18/2006  . Sleep apnea 09/18/2006   Past Medical History:  Diagnosis Date  . Arthritis   . Diabetes mellitus without complication ([facility]   . Dysrhythmia    unknown type  . GERD (gastroesophageal reflux disease)   . Hypertension    MD stopped BP med 1 yr ago due to normal BP  . Obesity   . Pulmonary embolism, bilateral ([facility] 09/2012   on xarelto  . Shortness of breath    with exertion  . Sleep apnea    uses c pap  Past Surgical History:  Procedure Laterality Date  . BREATH TEK H PYLORI N/A 12/28/2012   Procedure: BREATH TEK H PYLORI;  [place]: Gayland Curry, MD;  Location: [facility];  Service: General;  Laterality: N/A;  . HIP SURGERY Bilateral    to "shave gthe bones"  . LAPAROSCOPIC GASTRIC SLEEVE RESECTION N/A 05/31/2013   Procedure: LAPAROSCOPIC GASTRIC SLEEVE RESECTION;  [place]ell: Gayland Curry, MD;  Location: [facility];  Service: General;  Laterality: N/A;   No Known Allergies Prior to Admission medications   Medication Sig Start Date End Date Taking? Authorizing Provider  atorvastatin (LIPITOR) 10 MG tablet Take 1 tablet (10 mg  total) by mouth daily at 6 PM. 01/19/18  Yes Wendie Agreste, MD  blood glucose meter kit and supplies Dispense based on patient and insurance preference. Use up to four times daily as directed. (FOR ICD-10 E10.9, E11.9). 01/15/18  Yes Wendie Agreste, MD  Blood Glucose Monitoring Suppl (FREESTYLE LITE) DEVI  01/15/18  Yes [provider]  cyclobenzaprine (FLEXERIL) 10 MG tablet Take 10 mg by mouth 3 (three) times daily as needed for muscle spasms.   Yes [provider]  ELIQUIS 5 MG TABS tablet TAKE 1 TABLET BY MOUTH 2 TIMES DAILY FOR 30 DAYS 10/21/18  Yes Wendie Agreste, MD  glucose blood test strip Up to 3 times per day - DM2 with hyperglycemia. Uncontrolled. 10/22/18  Yes Wendie Agreste, MD  Insulin Glargine (BASAGLAR KWIKPEN) 100 UNIT/ML SOPN Inject 0.5 mLs (50 Units total) into the skin daily. 07/20/18  Yes Wendie Agreste, MD  Insulin Pen Needle (PEN NEEDLES) 32G X 4 MM MISC 1 application by Does not apply route daily. 10/22/18  Yes Wendie Agreste, MD  Lancets (FREESTYLE) lancets  01/15/18  Yes [provider]  metFORMIN (GLUCOPHAGE) 1000 MG tablet Take 1 tablet (1,000 mg total) by mouth 2 (two) times daily with a meal. 10/22/18  Yes Wendie Agreste, MD  amLODipine (NORVASC) 5 MG tablet Take 1 tablet (5 mg total) by mouth daily. Patient not taking: Reported on 08/12/2018 01/01/18 09/18/18  Wendie Agreste, MD  levocetirizine (XYZAL) 5 MG tablet Take 1 tablet (5 mg total) by mouth every evening. Patient not taking: Reported on 08/12/2018 08/23/17 09/18/18  Kennyth Arnold, FNP   Social History   Socioeconomic History  . Marital status: Single    Spouse name: Not on file  . Number of children: 0  . Years of education: Not on file  . Highest education level: Not on file  Occupational History  . Not on file  Social Needs  . Financial resource strain: Not on file  . Food insecurity    Worry: Not on file    Inability: Not on file  . Transportation needs     Medical: Not on file    Non-medical: Not on file  Tobacco Use  . Smoking status: Current Every Day Smoker    Packs/day: 1.00    Years: 6.00    Pack years: 6.00    Types: Cigars  . Smokeless tobacco: Never Used  Substance and Sexual Activity  . Alcohol use: Yes    Comment: occasional  . Drug use: No  . Sexual activity: Not on file  Lifestyle  . Physical activity    Days per week: Not on file    Minutes per session: Not on file  . Stress: Not on file  Relationships  . Social Herbalist on  phone: Not on file    Gets together: Not on file    Attends religious service: Not on file    Active member of club or organization: Not on file    Attends meetings of clubs or organizations: Not on file    Relationship status: Not on file  . Intimate partner violence    Fear of current or ex partner: Not on file    Emotionally abused: Not on file    Physically abused: Not on file    Forced sexual activity: Not on file  Other Topics Concern  . Not on file  Social History Narrative   Lives alone.  Works at [employer].  now working at [employer] [place]  Constitutional: Negative for fatigue and unexpected weight change.  Eyes: Negative for visual disturbance.  Respiratory: Negative for cough, chest tightness and shortness of breath.   Cardiovascular: Negative for chest pain, palpitations and leg swelling.  Gastrointestinal: Negative for abdominal pain and blood in stool.  Neurological: Negative for dizziness, light-headedness and headaches.       Objective:   Physical Exam Vitals signs reviewed.  Constitutional:      Appearance: He is well-developed. He is obese.  HENT:     Head: Normocephalic and atraumatic.  Eyes:     Pupils: Pupils are equal, round, and reactive to light.  Neck:     Vascular: No carotid bruit or JVD.  Cardiovascular:     Rate and Rhythm: Normal rate and regular rhythm.     Heart sounds: Normal heart sounds. No murmur.  Pulmonary:      Effort: Pulmonary effort is normal.     Breath sounds: Normal breath sounds. No rales.  Skin:    General: Skin is warm and dry.  Neurological:     Mental Status: He is alert and oriented to person, place, and time.    Vitals:   10/22/18 1623  BP: 119/72  Pulse: 98  Resp: 18  Temp: 98.9 F (37.2 C)  TempSrc: Oral  SpO2: 97%  Weight: (!) 357 lb (161.9 kg)   Results for orders placed or performed in visit on 10/22/18  POCT glycosylated hemoglobin (Hb A1C)  Result Value Ref Range   Hemoglobin A1C 10.5 (A) 4.0 - 5.6 %   HbA1c POC (<> result, manual entry)     HbA1c, POC (prediabetic range)     HbA1c, POC (controlled diabetic range)    POCT glucose (manual entry)  Result Value Ref Range   POC Glucose 294 (A) 70 - 99 mg/dl      Assessment & Plan:   Phillip Frye is a 47 y.o. male Type 2 diabetes mellitus with hyperglycemia, with long-term current use of insulin ([place]) - Plan: POCT glycosylated hemoglobin (Hb A1C), POCT glucose (manual entry), Ambulatory referral to Endocrinology, Insulin Glargine (BASAGLAR KWIKPEN) 100 UNIT/ML SOPN, atorvastatin (LIPITOR) 10 MG tablet, metFORMIN (GLUCOPHAGE) 1000 MG tablet, DISCONTINUED: metFORMIN (GLUCOPHAGE) 1000 MG tablet, CANCELED: Hemoglobin A1c Type 2 diabetes mellitus with hyperglycemia, without long-term current use of insulin ([place]) - Plan: glucose blood test strip, Insulin Pen Needle (PEN NEEDLES) 32G X 4 MM MISC  -Unfortunately still uncontrolled.  Previous med nonadherence or partial adherence.  Borderline lows in the past with advancing doses of insulin  -Try higher dose of Basaglar at 55 units for now, continue metformin same dose, refer to endocrinology for further treatment and adjustments.  Essential hypertension - Plan: Comprehensive metabolic panel  -Stable.  No changes  Hyperlipidemia,  unspecified hyperlipidemia type - Plan: Lipid Panel At risk for cardiovascular event - Plan: atorvastatin (LIPITOR) 10 MG tablet  -Tolerating  statin.  Labs pending.  same dose Lipitor for now.  Screening for HIV (human immunodeficiency virus) - Plan: HIV Antibody (routine testing w rflx)  Need for prophylactic vaccination and inoculation against influenza - Plan: Flu Vaccine QUAD 36+ mos IM  Need for prophylactic vaccination with combined diphtheria-tetanus-pertussis (DTP) vaccine - Plan: Tdap vaccine greater than or equal to 7yo IM     Meds ordered this encounter  Medications  . glucose blood test strip    Sig: Up to 3 times per day - DM2 with hyperglycemia. Uncontrolled.    Dispense:  100 each    Refill:  5  . Insulin Pen Needle (PEN NEEDLES) 32G X 4 MM MISC    Sig: 1 application by Does not apply route daily.    Dispense:  90 each    Refill:  2  . DISCONTD: metFORMIN (GLUCOPHAGE) 1000 MG tablet    Sig: Take 1 tablet (1,000 mg total) by mouth 2 (two) times daily with a meal.    Dispense:  180 tablet    Refill:  1  . Insulin Glargine (BASAGLAR KWIKPEN) 100 UNIT/ML SOPN    Sig: Inject 0.55 mLs (55 Units total) into the skin daily.    Dispense:  15 pen    Refill:  2  . atorvastatin (LIPITOR) 10 MG tablet    Sig: Take 1 tablet (10 mg total) by mouth daily at 6 PM.    Dispense:  90 tablet    Refill:  1  . metFORMIN (GLUCOPHAGE) 1000 MG tablet    Sig: Take 1 tablet (1,000 mg total) by mouth 2 (two) times daily with a meal.    Dispense:  180 tablet    Refill:  1   Patient Instructions   Unfortunately blood sugar is still uncontrolled.  Increase Basaglar to 55 units/day for now, continue metformin same dose.  I will refer you to endocrinology to help manage blood sugar and diabetes.  Follow-up with cardiology regarding atrial fibrillation, but you should be on Eliquis twice per day unless they have advised you otherwise.  Recheck with me in 6 months, but should be seen by endocrinology within the next 4 to 6 weeks.  Return to the clinic or go to the nearest emergency room if any of your symptoms worsen or new symptoms  occur.   If you have lab work done today you will be contacted with your lab results within the next 2 weeks.  If you have not heard from [place] then please contact us. The fastest way to get your results is to register for My Chart.   IF you received an x-ray today, you will receive an invoice from [facility]. Please contact [facility] at [phone] with questions or concerns regarding your invoice.   IF you received labwork today, you will receive an invoice from [place]. Please contact [facility] at [phone] with questions or concerns regarding your invoice.   Our billing staff will not be able to assist you with questions regarding bills from these companies.  You will be contacted with the lab results as soon as they are available. The fastest way to get your results is to activate your My Chart account. Instructions are located on the last page of this paperwork. If you have not heard from [place] regarding the results in 2 weeks, please contact this office.  Signed,   [name], MD [facility].  10/24/18 1:01 PM

## 2018-10-23 LAB — COMPREHENSIVE METABOLIC PANEL
ALT: 20 IU/L (ref 0–44)
AST: 17 IU/L (ref 0–40)
Albumin/Globulin Ratio: 1.6 (ref 1.2–2.2)
Albumin: 4.5 g/dL (ref 4.0–5.0)
Alkaline Phosphatase: 95 IU/L (ref 39–117)
BUN/Creatinine Ratio: 16 (ref 9–20)
BUN: 15 mg/dL (ref 6–24)
Bilirubin Total: 0.3 mg/dL (ref 0.0–1.2)
CO2: 22 mmol/L (ref 20–29)
Calcium: 9.7 mg/dL (ref 8.7–10.2)
Chloride: 98 mmol/L (ref 96–106)
Creatinine, Ser: 0.94 mg/dL (ref 0.76–1.27)
GFR calc Af Amer: 110 mL/min/{1.73_m2} (ref >59)
GFR calc non Af Amer: 95 mL/min/{1.73_m2} (ref >59)
Globulin, Total: 2.8 g/dL (ref 1.5–4.5)
Glucose: 272 mg/dL — ABNORMAL HIGH (ref 65–99)
Potassium: 4 mmol/L (ref 3.5–5.2)
Sodium: 135 mmol/L (ref 134–144)
Total Protein: 7.3 g/dL (ref 6.0–8.5)

## 2018-10-23 LAB — LIPID PANEL
Chol/HDL Ratio: 4.9 ratio (ref 0.0–5.0)
Cholesterol, Total: 113 mg/dL (ref 100–199)
HDL: 23 mg/dL — ABNORMAL LOW (ref >39)
LDL Chol Calc (NIH): 30 mg/dL (ref 0–99)
Triglycerides: 422 mg/dL — ABNORMAL HIGH (ref 0–149)
VLDL Cholesterol Cal: 60 mg/dL — ABNORMAL HIGH (ref 5–40)

## 2018-10-23 LAB — HEMOGLOBIN A1C
Est. average glucose Bld gHb Est-mCnc: 252 mg/dL
Hgb A1c MFr Bld: 10.4 % — ABNORMAL HIGH (ref 4.8–5.6)

## 2018-10-23 LAB — HIV ANTIBODY (ROUTINE TESTING W REFLEX): HIV Screen 4th Generation wRfx: NONREACTIVE

## 2018-10-24 ENCOUNTER — Encounter: Payer: Self-pay | Admitting: Family Medicine

## 2018-10-27 ENCOUNTER — Other Ambulatory Visit: Payer: Self-pay

## 2018-10-27 ENCOUNTER — Telehealth: Payer: Self-pay | Admitting: Family Medicine

## 2018-10-27 NOTE — Telephone Encounter (Signed)
I called pt, offered him an appt with NP tomorrow instead of Dr. Rexene Alberts. Pt is agreeable to this. Appt changed to 2:00pm with Jinny Blossom, NP tomorrow. Pt understood arrival time and to bring his cpap.

## 2018-10-27 NOTE — Telephone Encounter (Signed)
Copied from [place] [id number]. Topic: General - Other >> Oct 26, 2018 12:59 PM Burchel, Abbi R wrote: Reason for CRM: Pt calling to request lab results from 10/22/2018.  Please call pt: [phone] >> Oct 27, 2018  8:35 AM Scherrie Gerlach wrote: Pt calling again for A1C results. pls call Pt is going to have surgery 10/15 and has to bring this down.

## 2018-10-28 ENCOUNTER — Encounter: Payer: Self-pay | Admitting: Internal Medicine

## 2018-10-28 ENCOUNTER — Ambulatory Visit: Payer: Self-pay | Admitting: Neurology

## 2018-10-28 ENCOUNTER — Ambulatory Visit: Payer: BC Managed Care – PPO | Admitting: Internal Medicine

## 2018-10-28 ENCOUNTER — Encounter: Payer: Self-pay | Admitting: Adult Health

## 2018-10-28 ENCOUNTER — Ambulatory Visit (INDEPENDENT_AMBULATORY_CARE_PROVIDER_SITE_OTHER): Payer: BC Managed Care – PPO | Admitting: Adult Health

## 2018-10-28 VITALS — BP 127/84 | HR 90 | Temp 96.2°F | Ht 74.0 in | Wt 361.4 lb

## 2018-10-28 VITALS — BP 118/78 | HR 80 | Temp 98.2°F | Ht 74.0 in | Wt 360.2 lb

## 2018-10-28 DIAGNOSIS — E1165 Type 2 diabetes mellitus with hyperglycemia: Secondary | ICD-10-CM | POA: Diagnosis not present

## 2018-10-28 DIAGNOSIS — G4733 Obstructive sleep apnea (adult) (pediatric): Secondary | ICD-10-CM | POA: Diagnosis not present

## 2018-10-28 DIAGNOSIS — Z794 Long term (current) use of insulin: Secondary | ICD-10-CM

## 2018-10-28 DIAGNOSIS — Z9989 Dependence on other enabling machines and devices: Secondary | ICD-10-CM

## 2018-10-28 DIAGNOSIS — E785 Hyperlipidemia, unspecified: Secondary | ICD-10-CM | POA: Insufficient documentation

## 2018-10-28 DIAGNOSIS — E119 Type 2 diabetes mellitus without complications: Secondary | ICD-10-CM

## 2018-10-28 LAB — GLUCOSE, POCT (MANUAL RESULT ENTRY): POC Glucose: 119 mg/dl — AB (ref 70–99)

## 2018-10-28 MED ORDER — BASAGLAR KWIKPEN 100 UNIT/ML ~~LOC~~ SOPN: 45.0000 [IU] | PEN_INJECTOR | Freq: Every day | SUBCUTANEOUS | 2 refills | Status: DC

## 2018-10-28 MED ORDER — OZEMPIC (0.25 OR 0.5 MG/DOSE) 2 MG/1.5ML ~~LOC~~ SOPN: 0.5000 mg | PEN_INJECTOR | SUBCUTANEOUS | 6 refills | Status: DC

## 2018-10-28 NOTE — Patient Instructions (Signed)

## 2018-10-28 NOTE — Progress Notes (Signed)
Name: Phillip Frye  MRN/ DOB: [id number], 1970/02/27   Age/ Sex: 48 y.o., male    PCP: Wendie Agreste, MD   Reason for Endocrinology Evaluation: Type 2 Diabetes Mellitus     Date of Initial Endocrinology Visit: 10/28/2018     PATIENT IDENTIFIER: Phillip Frye is a 48 y.o. male with a past medical history of HTN, PE, OSA and HTN. The patient presented for initial endocrinology clinic visit on 10/28/2018 for consultative assistance with his diabetes management.    HPI: [name] was    Diagnosed with DM in 2019 Prior Medications tried/Intolerance: Victoza, Januvia Currently checking blood sugars 3 x / day Hypoglycemia episodes : no           Hemoglobin A1c has ranged from 10.5% in 2020, peaking at 12.8% in 2019. Patient required assistance for hypoglycemia: no Patient has required hospitalization within the last 1 year from hyper or hypoglycemia: no  In terms of diet, the patient  Eats 2 meals a day , snacks 2x a day Pt admits to poor eating habits with drinking energy drinks .   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 Arthritis   . Diabetes mellitus without complication ([place])   . Dysrhythmia    unknown type  . GERD (gastroesophageal reflux disease)   . Hypertension    MD stopped BP med 1 yr ago due to normal BP  . Obesity   . Pulmonary embolism, bilateral ([place]) 09/2012   on xarelto  . Shortness of breath    with exertion  . Sleep apnea     uses c pap    Past Surgical History:  Past Surgical History:  Procedure Laterality Date  . BREATH TEK H PYLORI N/A 12/28/2012   Procedure: BREATH TEK H PYLORI;  [place]: Gayland Curry, MD;  Location: [facility];  Service: General;  Laterality: N/A;  . HIP SURGERY Bilateral    to "shave gthe bones"  . LAPAROSCOPIC GASTRIC SLEEVE RESECTION N/A 05/31/2013   Procedure: LAPAROSCOPIC GASTRIC SLEEVE RESECTION;  [place]: Gayland Curry, MD;  Location: [facility];  Service: General;  Laterality: N/A;      Social History:  reports that he has been smoking cigars. He has a 6.00 pack-year smoking history. He has never used smokeless tobacco. He reports current alcohol use. He reports that he does not use drugs. Family History:  Family History  Problem Relation Age of Onset  . Cirrhosis Father   . Diabetes Other   . Diabetes Other   . Emphysema Paternal Grandfather        smoked     HOME MEDICATIONS: Allergies as of 10/28/2018   No Known Allergies     Medication List       Accurate as of October 28, 2018  8:02 AM. If you have any questions, ask your nurse or doctor.  STOP taking these medications   cyclobenzaprine 10 MG tablet Commonly known as: FLEXERIL Stopped by: Dorita Sciara, MD     TAKE these medications   atorvastatin 10 MG tablet Commonly known as: LIPITOR Take 1 tablet (10 mg total) by mouth daily at 6 PM.   Basaglar KwikPen 100 UNIT/ML Sopn Inject 0.55 mLs (55 Units total) into the skin daily.   blood glucose meter kit and supplies Dispense based on patient and insurance preference. Use up to four times daily as directed. (FOR ICD-10 E10.9, E11.9).   CINNAMON PO Take 500 mg by mouth 2 (two) times daily.   Eliquis 5 MG Tabs tablet Generic drug: apixaban TAKE 1 TABLET BY MOUTH 2 TIMES DAILY FOR 30 DAYS   freestyle lancets   FreeStyle Lite Devi   glucose blood test strip Up to 3 times per day - DM2 with hyperglycemia. Uncontrolled.   losartan  50 MG tablet Commonly known as: COZAAR Take 50 mg by mouth daily.   metFORMIN 1000 MG tablet Commonly known as: GLUCOPHAGE Take 1 tablet (1,000 mg total) by mouth 2 (two) times daily with a meal.   metoprolol succinate 50 MG 24 hr tablet Commonly known as: TOPROL-XL Take 50 mg by mouth daily. Take with or immediately following a meal.   Pen Needles 32G X 4 MM Misc 1 application by Does not apply route daily.        ALLERGIES: No Known Allergies   REVIEW OF SYSTEMS: A comprehensive ROS was conducted with the patient and is negative except as per HPI and below:  Review of Systems  Constitutional: Negative for chills and fever.  HENT: Negative for congestion and sore throat.   Eyes: Negative for blurred vision and pain.  Respiratory: Negative for cough and shortness of breath.   Gastrointestinal: Negative for diarrhea and nausea.  Genitourinary: Negative for frequency.  Musculoskeletal: Positive for joint pain.  Neurological: Negative for tingling and tremors.  Endo/Heme/Allergies: Negative for polydipsia.  Psychiatric/Behavioral: Negative for depression. The patient is not nervous/anxious.       OBJECTIVE:   VITAL SIGNS: BP 118/78 (BP Location: Right Arm, Patient Position: Sitting, Cuff Size: Large)   Pulse 80   Temp 98.2 F (36.8 C)   Ht '6\' 2"'$  (1.88 m)   Wt (!) 360 lb 3.2 oz (163.4 kg)   SpO2 97%   BMI 46.25 kg/m    PHYSICAL EXAM:  General: Pt appears well and is in NAD  Hydration: Well-hydrated with moist mucous membranes and good skin turgor  HEENT: Head: Unremarkable with good dentition. Oropharynx clear without exudate.  Eyes: External eye exam normal without stare, lid lag or exophthalmos.  EOM intact.  PERRL.  Neck: General: Supple without adenopathy or carotid bruits. Thyroid: Thyroid size normal.  No goiter or nodules appreciated. No thyroid bruit.  Lungs: Clear with good BS bilat with no rales, rhonchi, or wheezes  Heart: RRR with normal S1 and S2  and no gallops; no murmurs; no rub  Abdomen: Normoactive bowel sounds, soft, nontender, without masses or organomegaly palpable  Extremities:  Lower extremities - No pretibial edema. No lesions.  Skin: Normal texture and temperature to palpation. No rash noted. No Acanthosis nigricans/skin tags. No lipohypertrophy.  Neuro: MS is good with appropriate affect, pt is alert and Ox3    DM foot exam: 10/28/18  The skin of the feet is intact without sores or ulcerations. The pedal pulses are 2+ on right and 2+ on left. The sensation is intact  to a screening 5.07, 10 gram monofilament bilaterally   DATA REVIEWED:  Lab Results  Component Value Date   HGBA1C 10.5 (A) 10/22/2018   HGBA1C 10.4 (H) 10/22/2018   HGBA1C 12.8 (A) 12/04/2017   Lab Results  Component Value Date   LDLCALC 72 01/07/2018   CREATININE 0.94 10/22/2018     Lab Results  Component Value Date   CHOL 113 10/22/2018   HDL 23 (L) 10/22/2018   LDLCALC 72 01/07/2018   TRIG 422 (H) 10/22/2018   CHOLHDL 4.9 10/22/2018        ASSESSMENT / PLAN / RECOMMENDATIONS:   1) Type 2 Diabetes Mellitus, Poorly controlled, Without complications - Most recent A1c of 10.5 %. Goal A1c < 7.0 %.    Plan: GENERAL: I have discussed with the patient the pathophysiology of diabetes. We went over the natural progression of the disease. We talked about both insulin resistance and insulin deficiency. We stressed the importance of lifestyle changes including diet and exercise. I explained the complications associated with diabetes including retinopathy, nephropathy, neuropathy as well as increased risk of cardiovascular disease. We went over the benefit seen with glycemic control.   I explained to the patient that diabetic patients are at higher than normal risk for amputations.   Pt admits to poorly controlled diabetes due to medication non-adherence and dietary indiscretions,he is awaiting on glucose control to proceed with left knee surgery,  so he is motivated at this time to control his diabetes.   His In-Office Bg was 119 mg /dL this is after eating breakfast.   We discussed add on therapy with a GLP-1 agonist to help his weight, and continue to improve is glucose, he denies any hx of pancreatitis or FH of medullary thyroid cancer.   We also discussed the importance of checking glucose at home and having that data available to me   MEDICATIONS: - Start Ozempic 0.25 mg weekly for 6 weeks, if no vomiting or diarrhea, after 6 weeks please increase to 0.5 mg weekly  - Decrease basaglar to 45 units daily  - Continue Metformin Twice a day with meals   EDUCATION / INSTRUCTIONS:  BG monitoring instructions: Patient is instructed to check his blood sugars 2 times a day, fasting and bedtime - uses ReliOn   Call [facility] if: BG persistently < 70 or > 300. . I reviewed the Rule of 15 for the treatment of hypoglycemia in detail with the patient. Literature supplied.   2) Diabetic complications:   Eye: Does not have known diabetic retinopathy.   Neuro/ Feet: Does not have known diabetic peripheral neuropathy.  Renal: Patient does not have known baseline CKD. He is on an ACEI/ARB at present.Check urine albumin/creatinine ratio yearly starting at time of diagnosis.    3) Lipids: Patient is  on a statin. Discussed the cardiovascular benefits of statins    4) Hypertension: He is  at goal of < 140/90 mmHg.   F/u in 3 months   Signed electronically by: Mack Guise, MD  [facility]  [facility] [address] [place], [place] [address] Phone: [phone] FAX: [phone]   CC: Wendie Agreste, MD [address] [place] [address] Phone: [phone]  Fax: [phone]    Return to Endocrinology clinic as below: Future Appointments  Date Time Provider [place]  10/28/2018  2:00 PM Ward Givens, NP [facility] None  04/22/2019  8:40 AM  Wendie Agreste, MD PCP[facility]

## 2018-10-28 NOTE — Progress Notes (Addendum)
PATIENT: Phillip Frye DOB: [birth year]  REASON FOR VISIT: follow up HISTORY FROM: patient  HISTORY OF PRESENT ILLNESS: Today 10/28/18 :  Phillip Frye is a 48 year old male with a history of obstructive sleep apnea on CPAP.  His download indicates that he uses machine 25 out of 30 days for compliance of 83%.  He uses machine greater than 4 hours 14 days for compliance of 47%.  On average he uses his machine 4 hours and 10 minutes.  His residual AHI is 1.5 on 16 cm of water with EPR of 3.  He reports that CPAP has been working well for him.  He states that he no longer falls asleep when he is in a car.  He also states that the tremor in the right hand has resolved.  He states that he does have a knee injury and that prohibits him from sleeping very long.  He states he typically only gets around 4 hours of sleep each night.  HISTORY Phillip Frye is a 48 year old right-handed gentleman with an underlying medical history of hypertension, dysrhythmia, diabetes, arthritis, reflux disease, history of pulmonary embolism bilaterally, and morbid obesity with a BMI of over 45 status post gastric sleeve, who was previously diagnosed with obstructive sleep apnea. Sleep study testing was several years ago, and I reviewed his sleep study report from 04/04/2006. He was diagnosed with severe sleep apnea with an AHI of 81 per hour. Lowest oxygen saturation was 75%. Of note, he was on supplemental oxygen at the time. He is not on CPAP therapy any longer, reports having difficulty tolerating the full facemask and he would pull the mask off at night inadvertently. He estimates that he used CPAP for a few years, has not been on it for the past 7-8 years. He was on supplemental oxygen at night but has not been on oxygen for the past 3 or 4 years he estimates. He is currently not working, but is soon starting a new job at [employer], will be working 6 PM to 6 AM, 12 hour shifts. His current bedtime is between 7 and 9 PM,  rise time is around 2:30 AM. He has significant nocturia about 5 times per average night and has woken up with the occasional morning headache, his sister has sleep apnea. He lives with his sister, his brother and sister's son. He is not a cigarette smoker, smokes cigars. He drinks alcohol rarely, estimates about 2 alcoholic beverages per month, does not typically drink any caffeine on a day-to-day basis. I reviewed your office note from 01/19/2018. His Epworth sleepiness score is 12 out of 24 today, fatigue score is 29 out of 63. He would be willing to get reevaluated with a sleep study but is not sure that he will be able to sleep enough and is not sure if he would be able to tolerate CPAP this time around, he is interested in pursuing surgical consultation and also learn about the [employer] device.  REVIEW OF SYSTEMS: Out of a complete 14 system review of symptoms, the patient complains only of the following symptoms, and all other reviewed systems are negative.  ESS 5 FSS11  ALLERGIES: No Known Allergies  HOME MEDICATIONS: Outpatient Medications Prior to Visit  Medication Sig Dispense Refill  . atorvastatin (LIPITOR) 10 MG tablet Take 1 tablet (10 mg total) by mouth daily at 6 PM. 90 tablet 1  . blood glucose meter kit and supplies Dispense based on patient and insurance preference. Use up to  four times daily as directed. (FOR ICD-10 E10.9, E11.9). 1 each 0  . Blood Glucose Monitoring Suppl (FREESTYLE LITE) DEVI   0  . CINNAMON PO Take 500 mg by mouth 2 (two) times daily.    Marland Kitchen ELIQUIS 5 MG TABS tablet TAKE 1 TABLET BY MOUTH 2 TIMES DAILY FOR 30 DAYS 60 tablet 0  . glucose blood test strip Up to 3 times per day - DM2 with hyperglycemia. Uncontrolled. 100 each 5  . Insulin Glargine (BASAGLAR KWIKPEN) 100 UNIT/ML SOPN Inject 0.45 mLs (45 Units total) into the skin daily. 15 pen 2  . Insulin Pen Needle (PEN NEEDLES) 32G X 4 MM MISC 1 application by Does not apply route daily. 90 each 2  . Lancets  (FREESTYLE) lancets   0  . losartan (COZAAR) 50 MG tablet Take 50 mg by mouth daily.    . metFORMIN (GLUCOPHAGE) 1000 MG tablet Take 1 tablet (1,000 mg total) by mouth 2 (two) times daily with a meal. 180 tablet 1  . metoprolol succinate (TOPROL-XL) 50 MG 24 hr tablet Take 50 mg by mouth daily. Take with or immediately following a meal.    . Semaglutide,0.25 or 0.'5MG'$ /DOS, (OZEMPIC, 0.25 OR 0.5 MG/DOSE,) 2 MG/1.5ML SOPN Inject 0.5 mg into the skin once a week. 1 pen 6   No facility-administered medications prior to visit.     PAST MEDICAL HISTORY: Past Medical History:  Diagnosis Date  . Arthritis   . Diabetes mellitus without complication (Phillip Frye)   . Dysrhythmia    unknown type  . GERD (gastroesophageal reflux disease)   . Hypertension    MD stopped BP med 1 yr ago due to normal BP  . Obesity   . Pulmonary embolism, bilateral (Phillip Frye) 09/2012   on xarelto  . Shortness of breath    with exertion  . Sleep apnea    uses c pap     PAST SURGICAL HISTORY: Past Surgical History:  Procedure Laterality Date  . BREATH TEK H PYLORI N/A 12/28/2012   Procedure: BREATH TEK H PYLORI;  Phillip Frye: Phillip Curry, MD;  Location: [facility];  Service: General;  Laterality: N/A;  . HIP SURGERY Bilateral    to "shave gthe bones"  . LAPAROSCOPIC GASTRIC SLEEVE RESECTION N/A 05/31/2013   Procedure: LAPAROSCOPIC GASTRIC SLEEVE RESECTION;  Phillip Frye: Phillip Curry, MD;  Location: [facility];  Service: General;  Laterality: N/A;    FAMILY HISTORY: Family History  Problem Relation Age of Onset  . Cirrhosis Father   . Diabetes Other   . Diabetes Other   . Emphysema Paternal Grandfather        smoked    SOCIAL HISTORY: Social History   Socioeconomic History  . Marital status: Single    Spouse name: Not on file  . Number of children: 0  . Years of education: Not on file  . Highest education level: Not on file  Occupational History  . Not on file  Social Needs  . Financial resource strain: Not on  file  . Food insecurity    Worry: Not on file    Inability: Not on file  . Transportation needs    Medical: Not on file    Non-medical: Not on file  Tobacco Use  . Smoking status: Current Every Day Smoker    Packs/day: 1.00    Years: 6.00    Pack years: 6.00    Types: Cigars  . Smokeless tobacco: Never Used  Substance and Sexual Activity  .  Alcohol use: Yes    Comment: occasional  . Drug use: No  . Sexual activity: Not on file  Lifestyle  . Physical activity    Days per week: Not on file    Minutes per session: Not on file  . Stress: Not on file  Relationships  . Social Herbalist on phone: Not on file    Gets together: Not on file    Attends religious service: Not on file    Active member of club or organization: Not on file    Attends meetings of clubs or organizations: Not on file    Relationship status: Not on file  . Intimate partner violence    Fear of current or ex partner: Not on file    Emotionally abused: Not on file    Physically abused: Not on file    Forced sexual activity: Not on file  Other Topics Concern  . Not on file  Social History Narrative   Lives alone.  Works at [employer].      PHYSICAL EXAM   Generalized: Well developed, in no acute distress  Chest: Lungs diminished to auscultation bilaterally  Neurological examination  Mentation: Alert oriented to time, place, history taking. Follows all commands speech and language fluent Cranial nerve II-XII: Extraocular movements were full, visual field were full on confrontational test Head turning and shoulder shrug  were normal and symmetric. Motor: The motor testing reveals 5 over 5 strength of all 4 extremities. Good symmetric motor tone is noted throughout.  Sensory: Sensory testing is intact to soft touch on all 4 extremities. No evidence of extinction is noted.  Gait and station: Gait is normal.    DIAGNOSTIC DATA (LABS, IMAGING, TESTING) - I reviewed patient records, labs,  notes, testing and imaging myself where available.  Lab Results  Component Value Date   WBC 6.8 08/11/2018   HGB 14.6 08/11/2018   HCT 45.2 08/11/2018   MCV 92.4 08/11/2018   PLT 363 08/11/2018      Component Value Date/Time   NA 135 10/22/2018 1717   K 4.0 10/22/2018 1717   CL 98 10/22/2018 1717   CO2 22 10/22/2018 1717   GLUCOSE 272 (H) 10/22/2018 1717   GLUCOSE 158 (H) 08/11/2018 2235   BUN 15 10/22/2018 1717   CREATININE 0.94 10/22/2018 1717   CREATININE 1.08 12/14/2012 1428   CALCIUM 9.7 10/22/2018 1717   PROT 7.3 10/22/2018 1717   ALBUMIN 4.5 10/22/2018 1717   AST 17 10/22/2018 1717   ALT 20 10/22/2018 1717   ALKPHOS 95 10/22/2018 1717   BILITOT 0.3 10/22/2018 1717   GFRNONAA 95 10/22/2018 1717   GFRAA 110 10/22/2018 1717   Lab Results  Component Value Date   CHOL 113 10/22/2018   HDL 23 (L) 10/22/2018   LDLCALC 72 01/07/2018   TRIG 422 (H) 10/22/2018   CHOLHDL 4.9 10/22/2018   Lab Results  Component Value Date   HGBA1C 10.5 (A) 10/22/2018   No results found for: VITAMINB12 Lab Results  Component Value Date   TSH 0.636 12/14/2012      ASSESSMENT AND PLAN 48 y.o. year old male  has a past medical history of Arthritis, Diabetes mellitus without complication ([place]), Dysrhythmia, GERD (gastroesophageal reflux disease), Hypertension, Obesity, Pulmonary embolism, bilateral ([place]) (09/2012), Shortness of breath, and Sleep apnea. here with:  1. Obstructive sleep apnea on CPAP  The patient's CPAP download shows excellent compliance and good treatment of his apnea.  He is encouraged  to continue using CPAP nightly and greater than 4 hours each night.  He is advised that if his symptoms worsen or he develops new symptoms he should let us know.  He will follow-up in 6 months or sooner if needed    I spent 15 minutes with the patient. 50% of this time was spent reviewing CPAP download   Ward Givens, MSN, NP-C 10/28/2018, 1:55 PM [facility]  [address], [place], [place] [address] [phone]  I reviewed the above note and documentation by the Nurse Practitioner and agree with the history, exam, assessment and plan as outlined above. I was immediately available for consultation. Star Age, MD, PhD [facility])

## 2018-10-28 NOTE — Patient Instructions (Addendum)
-   Start Ozempic 0.25 mg weekly for 6 weeks, if no vomiting or diarrhea, after 6 weeks please increase to 0.5 mg weekly    - Decrease basaglar to 45 units daily  - Continue Metformin Twice a day with meals   - Try and check your sugar before you eat the first meal of the day and at bedtime ( 3 pm and 6 AM )     HOW TO TREAT LOW BLOOD SUGARS (Blood sugar LESS THAN 70 MG/DL)  Please follow the RULE OF 15 for the treatment of hypoglycemia treatment (when your (blood sugars are less than 70 mg/dL)    STEP 1: Take 15 grams of carbohydrates when your blood sugar is low, which includes:   3-4 GLUCOSE TABS  OR  3-4 OZ OF JUICE OR REGULAR SODA OR  ONE TUBE OF GLUCOSE GEL     STEP 2: RECHECK blood sugar in 15 MINUTES STEP 3: If your blood sugar is still low at the 15 minute recheck --> then, go back to STEP 1 and treat AGAIN with another 15 grams of carbohydrates.

## 2018-10-29 ENCOUNTER — Telehealth: Payer: Self-pay | Admitting: Internal Medicine

## 2018-10-29 DIAGNOSIS — E1165 Type 2 diabetes mellitus with hyperglycemia: Secondary | ICD-10-CM

## 2018-10-29 DIAGNOSIS — Z794 Long term (current) use of insulin: Secondary | ICD-10-CM

## 2018-10-29 NOTE — Telephone Encounter (Signed)
Patient states that he has been having difficulty raising his blood sugars. Patient woke up with a sugar of 68, after breakfast(sausage,eggs, toast) 143, after lunch (pizza & fries) 220. He has not taken insulin today because he is worried it will drop it drastically.  Please Advise, Thanks

## 2018-10-30 MED ORDER — BASAGLAR KWIKPEN 100 UNIT/ML ~~LOC~~ SOPN: 35.0000 [IU] | PEN_INJECTOR | Freq: Every day | SUBCUTANEOUS | 2 refills | Status: DC

## 2018-10-30 NOTE — Telephone Encounter (Signed)
Spoke to pt and instructed him of the change pt stated that he will contact us with an update next week.

## 2018-10-30 NOTE — Telephone Encounter (Signed)
Please advise 

## 2018-11-02 NOTE — Telephone Encounter (Signed)
I have called pt and relayed the message to the pt and he stated that he needed to have a 3 month f/u. We have scheduled one for 02/08/2019. Pt stated understanding.

## 2018-11-10 ENCOUNTER — Telehealth: Payer: Self-pay | Admitting: Internal Medicine

## 2018-11-10 NOTE — Telephone Encounter (Signed)
Pt aware.

## 2018-11-10 NOTE — Telephone Encounter (Signed)
Patient states he has only been taking the Semaglutide,0.25 or 0.5MG /DOS, (OZEMPIC, 0.25 OR 0.5 MG/DOSE,) 2 MG/1.5ML SOPN because his blood sugars are not running any higher than 150. He has not taken his Metformin in 4 days.  Please Advise, Thanks

## 2018-11-10 NOTE — Telephone Encounter (Signed)
Please advise 

## 2018-11-27 ENCOUNTER — Encounter: Payer: Self-pay | Admitting: Family Medicine

## 2018-12-23 ENCOUNTER — Encounter: Payer: Self-pay | Admitting: Family Medicine

## 2018-12-23 ENCOUNTER — Other Ambulatory Visit: Payer: Self-pay

## 2018-12-23 ENCOUNTER — Telehealth: Payer: Self-pay | Admitting: Adult Health

## 2018-12-23 ENCOUNTER — Ambulatory Visit (INDEPENDENT_AMBULATORY_CARE_PROVIDER_SITE_OTHER): Payer: BC Managed Care – PPO | Admitting: Family Medicine

## 2018-12-23 VITALS — BP 136/88 | HR 91 | Temp 98.4°F | Ht 74.0 in | Wt 357.0 lb

## 2018-12-23 DIAGNOSIS — M545 Low back pain, unspecified: Secondary | ICD-10-CM

## 2018-12-23 DIAGNOSIS — E1165 Type 2 diabetes mellitus with hyperglycemia: Secondary | ICD-10-CM | POA: Diagnosis not present

## 2018-12-23 DIAGNOSIS — Z1211 Encounter for screening for malignant neoplasm of colon: Secondary | ICD-10-CM | POA: Diagnosis not present

## 2018-12-23 DIAGNOSIS — Z794 Long term (current) use of insulin: Secondary | ICD-10-CM

## 2018-12-23 LAB — POCT GLYCOSYLATED HEMOGLOBIN (HGB A1C): Hemoglobin A1C: 6.9 % — AB (ref 4.0–5.6)

## 2018-12-23 NOTE — Telephone Encounter (Signed)
I called pt and he is asking about his cpap machine turning off in middle of night.  He has tried to unplug and then restart.  Not sure if he messed up further.  He will call [facility] and check with them, they may be able to troubleshoot other ways.

## 2018-12-23 NOTE — Patient Instructions (Addendum)
Back pain is likely due to mechanical back pain or pain in the muscles lower spine.  That can happen from wear and tear but also from some tight muscles.  See information below.  Tylenol over-the-counter is fine for now, heat or ice to affected area as needed with gentle range of motion as tolerated.  If then if not improving in the next few weeks, please follow-up for repeat exam and possible x-rays.  Sooner if worse  Continue follow-up with your endocrinologist to discuss diabetes treatment.  Cologuard was ordered and will be shipped to your house.  If that test is positive can discuss next step in testing.  If negative/normal, repeat in 3 years.   Acute Back Pain, Adult Acute back pain is sudden and usually short-lived. It is often caused by an injury to the muscles and tissues in the back. The injury may result from:  A muscle or ligament getting overstretched or torn (strained). Ligaments are tissues that connect bones to each other. Lifting something improperly can cause a back strain.  Wear and tear (degeneration) of the spinal disks. Spinal disks are circular tissue that provides cushioning between the bones of the spine (vertebrae).  Twisting motions, such as while playing sports or doing yard work.  A hit to the back.  Arthritis. You may have a physical exam, lab tests, and imaging tests to find the cause of your pain. Acute back pain usually goes away with rest and home care. Follow these instructions at home: Managing pain, stiffness, and swelling  Take over-the-counter and prescription medicines only as told by your health care provider.  Your health care provider may recommend applying ice during the first 24-48 hours after your pain starts. To do this: ? Put ice in a plastic bag. ? Place a towel between your skin and the bag. ? Leave the ice on for 20 minutes, 2-3 times a day.  If directed, apply heat to the affected area as often as told by your health care provider. Use  the heat source that your health care provider recommends, such as a moist heat pack or a heating pad. ? Place a towel between your skin and the heat source. ? Leave the heat on for 20-30 minutes. ? Remove the heat if your skin turns bright red. This is especially important if you are unable to feel pain, heat, or cold. You have a greater risk of getting burned. Activity   Do not stay in bed. Staying in bed for more than 1-2 days can delay your recovery.  Sit up and stand up straight. Avoid leaning forward when you sit, or hunching over when you stand. ? If you work at a desk, sit close to it so you do not need to lean over. Keep your chin tucked in. Keep your neck drawn back, and keep your elbows bent at a right angle. Your arms should look like the letter "L." ? Sit high and close to the steering wheel when you drive. Add lower back (lumbar) support to your car seat, if needed.  Take short walks on even surfaces as soon as you are able. Try to increase the length of time you walk each day.  Do not sit, drive, or stand in one place for more than 30 minutes at a time. Sitting or standing for long periods of time can put stress on your back.  Do not drive or use heavy machinery while taking prescription pain medicine.  Use proper lifting techniques. When  you bend and lift, use positions that put less stress on your back: ? Portage your knees. ? Keep the load close to your body. ? Avoid twisting.  Exercise regularly as told by your health care provider. Exercising helps your back heal faster and helps prevent back injuries by keeping muscles strong and flexible.  Work with a physical therapist to make a safe exercise program, as recommended by your health care provider. Do any exercises as told by your physical therapist. Lifestyle  Maintain a healthy weight. Extra weight puts stress on your back and makes it difficult to have good posture.  Avoid activities or situations that make you feel  anxious or stressed. Stress and anxiety increase muscle tension and can make back pain worse. Learn ways to manage anxiety and stress, such as through exercise. General instructions  Sleep on a firm mattress in a comfortable position. Try lying on your side with your knees slightly bent. If you lie on your back, put a pillow under your knees.  Follow your treatment plan as told by your health care provider. This may include: ? Cognitive or behavioral therapy. ? Acupuncture or massage therapy. ? Meditation or yoga. Contact a health care provider if:  You have pain that is not relieved with rest or medicine.  You have increasing pain going down into your legs or buttocks.  Your pain does not improve after 2 weeks.  You have pain at night.  You lose weight without trying.  You have a fever or chills. Get help right away if:  You develop new bowel or bladder control problems.  You have unusual weakness or numbness in your arms or legs.  You develop nausea or vomiting.  You develop abdominal pain.  You feel faint. Summary  Acute back pain is sudden and usually short-lived.  Use proper lifting techniques. When you bend and lift, use positions that put less stress on your back.  Take over-the-counter and prescription medicines and apply heat or ice as directed by your health care provider. This information is not intended to replace advice given to you by your health care provider. Make sure you discuss any questions you have with your health care provider. Document Released: 01/28/2005 Document Revised: 05/19/2018 Document Reviewed: 09/11/2016 Elsevier Patient Education  [employer].    If you have lab work done today you will be contacted with your lab results within the next 2 weeks.  If you have not heard from [place] then please contact us. The fastest way to get your results is to register for My Chart.   IF you received an x-ray today, you will receive an invoice  from [facility]. Please contact [facility] at [phone] with questions or concerns regarding your invoice.   IF you received labwork today, you will receive an invoice from [place]. Please contact [facility] at [phone] with questions or concerns regarding your invoice.   Our billing staff will not be able to assist you with questions regarding bills from these companies.  You will be contacted with the lab results as soon as they are available. The fastest way to get your results is to activate your My Chart account. Instructions are located on the last page of this paperwork. If you have not heard from [place] regarding the results in 2 weeks, please contact this office.

## 2018-12-23 NOTE — Telephone Encounter (Signed)
Pt called in and stated that his CPAP machine cuts off by itself in the middle of the night

## 2018-12-23 NOTE — Progress Notes (Signed)
Subjective:    Patient ID: Phillip Frye, male    DOB: [birth year], 48 y.o.   MRN: [id number]  HPI Phillip Frye is a 48 y.o. male Presents today for: Chief Complaint  Patient presents with  . Diabetes    here to chk a1c, having pain in the back due to job function. no longer taking meloxicam    Diabetes: Complicated by hyperglycemia, previous medication nonadherence or partial adherence.  Had reported borderline lows in the past with advancing doses of insulin.  Discussed potential risks and complications of diabetes multiple visits.  Referred to endocrinology at September 10 visit but temporary increase of Basaglar to 55 units at that time.  Continued on metformin. Evaluated by [facility], Dr. Kelton Pillar on September 16.  Started on Ozempic, decreased Basaglar to 45 units, continued Metformin. Note to decrease basaglar on 9/18. Off basaglar altogether now as sugar dropping low. Off metformin as well - home readings under 130.    Lab Results  Component Value Date   HGBA1C 10.5 (A) 10/22/2018   HGBA1C 10.4 (H) 10/22/2018   HGBA1C 12.8 (A) 12/04/2017   Lab Results  Component Value Date   LDLCALC 30 10/22/2018   CREATININE 0.94 10/22/2018   OSA on CPAP: Appointment with sleep specialist in September, excellent compliance with CPAP on download of device at that time.  Continued on CPAP with 4-monthfollow-up  Back pain: Noticed after starting back to work 1 month ago.bilaterral low back. No leg weakness/radiation. No injury/fall.  No bowel or bladder incontinence, no saddle anesthesia, no lower extremity weakness.  Mentioned back pain at ortho, but not evaluated there. They did say may be sore with favoring L knee.  Notes soreness after standing all day, better on weekends.   Tx: none.  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 No heavy lifting/squatting. Has been followed by Dr. GBerenice Primasfor left knee pain. Has torn ACL, meniscus tear - planned on surgery if blood sugar low  enough for surgery. A1c needed to be below 8. Had Rx mobic, but concerns with heart issues and on Eliquis.   HM: agrees to cologuard after discussion on testing and possible need for colonoscopy.   Patient Active Problem List   Diagnosis Date Noted  . Type 2 diabetes mellitus with hyperglycemia, with long-term current use of insulin ([facility] 10/28/2018  . Dyslipidemia 10/28/2018  . Primary hypercoagulable state ([facility] 01/18/2018  . 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 Abnormal EKG 12/23/2012  . Obesity, Class III, BMI 40-49.9 (morbid obesity) ([place] 12/11/2012  . SOB (shortness of breath) 09/22/2012  . Pulmonary embolism, bilateral ([facility] 09/22/2012  . Diabetes mellitus without complication ([facility]   . DENTAL PAIN 03/31/2008  . CARPAL TUNNEL SYNDROME, BILATERAL 10/09/2007  . ULNAR NEUROPATHY 10/09/2007  . DENTAL CARIES 10/09/2007  . GERD 10/09/2007  . INGUINAL PAIN, BILATERAL 02/26/2007  . KNEE PAIN, RIGHT 12/19/2006  . FOOT PAIN, BILATERAL 12/19/2006  . HYPERTENSION 09/18/2006  . AVASCULAR NECROSIS 09/18/2006  . Sleep apnea 09/18/2006   Past Medical History:  Diagnosis Date  . Arthritis   . Diabetes mellitus without complication ([facility]   . Dysrhythmia    unknown type  . GERD (gastroesophageal reflux disease)   . Hypertension    MD stopped BP med 1 yr ago due to normal BP  . Obesity   . Pulmonary embolism, bilateral ([facility] 09/2012   on xarelto  . Shortness of breath    with exertion  . Sleep apnea  uses c pap    Past Surgical History:  Procedure Laterality Date  . BREATH TEK H PYLORI N/A 12/28/2012   Procedure: BREATH TEK H PYLORI;  Manger: Gayland Curry, MD;  Location: [facility];  Service: General;  Laterality: N/A;  . HIP SURGERY Bilateral    to "shave gthe bones"  . LAPAROSCOPIC GASTRIC SLEEVE RESECTION N/A 05/31/2013   Procedure: LAPAROSCOPIC GASTRIC SLEEVE RESECTION;  Mauceri: Gayland Curry, MD;  Location: [facility];  Service: General;  Laterality: N/A;    No Known Allergies Prior to Admission medications   Medication Sig Start Date End Date Taking? Authorizing Provider  atorvastatin (LIPITOR) 10 MG tablet Take 1 tablet (10 mg total) by mouth daily at 6 PM. 10/22/18  Yes Wendie Agreste, MD  blood glucose meter kit and supplies Dispense based on patient and insurance preference. Use up to four times daily as directed. (FOR ICD-10 E10.9, E11.9). 01/15/18  Yes Wendie Agreste, MD  Blood Glucose Monitoring Suppl (FREESTYLE LITE) DEVI  01/15/18  Yes [provider]  CINNAMON PO Take 500 mg by mouth 2 (two) times daily.   Yes [provider]  ELIQUIS 5 MG TABS tablet TAKE 1 TABLET BY MOUTH 2 TIMES DAILY FOR 30 DAYS 10/21/18  Yes Wendie Agreste, MD  glucose blood test strip Up to 3 times per day - DM2 with hyperglycemia. Uncontrolled. 10/22/18  Yes Wendie Agreste, MD  Insulin Pen Needle (PEN NEEDLES) 32G X 4 MM MISC 1 application by Does not apply route daily. 10/22/18  Yes Wendie Agreste, MD  Lancets (FREESTYLE) lancets  01/15/18  Yes [provider]  losartan (COZAAR) 50 MG tablet Take 50 mg by mouth daily.   Yes [provider]  metoprolol succinate (TOPROL-XL) 50 MG 24 hr tablet Take 50 mg by mouth daily. Take with or immediately following a meal.   Yes [provider]  Semaglutide,0.25 or 0.5MG/DOS, (OZEMPIC, 0.25 OR 0.5 MG/DOSE,) 2 MG/1.5ML SOPN Inject 0.5 mg into the skin once a week. 10/28/18  Yes Shamleffer, Melanie Crazier, MD  amLODipine (NORVASC) 5 MG tablet Take 1 tablet (5 mg total) by mouth daily. 01/01/18 12/23/18 Yes Wendie Agreste, MD  levocetirizine (XYZAL) 5 MG tablet Take 1 tablet (5 mg total) by mouth every evening. Patient not taking: Reported on 08/12/2018 08/23/17 09/18/18  Kennyth Arnold, FNP   Social History   Socioeconomic History  . Marital status: Single    Spouse name: Not on file  . Number of children: 0  . Years of education: Not on file  . Highest education level: Not  on file  Occupational History  . Not on file  Social Needs  . Financial resource strain: Not on file  . Food insecurity    Worry: Not on file    Inability: Not on file  . Transportation needs    Medical: Not on file    Non-medical: Not on file  Tobacco Use  . Smoking status: Current Every Day Smoker    Packs/day: 1.00    Years: 6.00    Pack years: 6.00    Types: Cigars  . Smokeless tobacco: Never Used  Substance and Sexual Activity  . Alcohol use: Yes    Comment: occasional  . Drug use: No  . Sexual activity: Not on file  Lifestyle  . Physical activity    Days per week: Not on file    Minutes per session: Not on file  . Stress: Not  on file  Relationships  . Social Herbalist on phone: Not on file    Gets together: Not on file    Attends religious service: Not on file    Active member of club or organization: Not on file    Attends meetings of clubs or organizations: Not on file    Relationship status: Not on file  . Intimate partner violence    Fear of current or ex partner: Not on file    Emotionally abused: Not on file    Physically abused: Not on file    Forced sexual activity: Not on file  Other Topics Concern  . Not on file  Social History Narrative   Lives alone.  Works at [employer].    Review of Systems Per HPI.     Objective:   Physical Exam Constitutional:      General: He is not in acute distress.    Appearance: He is well-developed. He is obese. He is not ill-appearing.  HENT:     Head: Normocephalic and atraumatic.  Cardiovascular:     Rate and Rhythm: Normal rate.  Pulmonary:     Effort: Pulmonary effort is normal.  Musculoskeletal:     Lumbar back: He exhibits decreased range of motion. He exhibits no bony tenderness, no swelling and no spasm.       Back:     Comments: Knee brace in place on left. Reflex testing deferred.    Neurological:     Mental Status: He is alert and oriented to person, place, and time.    Vitals:    12/23/18 1000  BP: 136/88  Pulse: 91  Temp: 98.4 F (36.9 C)  SpO2: 94%  Weight: (!) 357 lb (161.9 kg)  Height: _0  (1.88 m)    Results for orders placed or performed in visit on 12/23/18  POCT glycosylated hemoglobin (Hb A1C)  Result Value Ref Range   Hemoglobin A1C 6.9 (A) 4.0 - 5.6 %   HbA1c POC (<> result, manual entry)     HbA1c, POC (prediabetic range)     HbA1c, POC (controlled diabetic range)           Assessment & Plan:   Phillip Frye is a 48 y.o. male Type 2 diabetes mellitus with hyperglycemia, with long-term current use of insulin ([place]) - Plan: POCT glycosylated hemoglobin (Hb A1C)  - improved A1c. Follow up with endocrinology. Tolerating Ozempic.   Special screening for malignant neoplasms, colon - Plan: Cologuard  - risks/benefits of colonoscopy vs. cologuard discussed, including potential need for diagnostic colonoscopy. Understanding expressed.   Acute bilateral low back pain without sciatica  - suspect mechanical. No injury or red flags on exam. Symptomatic care, otc tylenol, handout given with RTC precautions if persistent or worsening. Imaging deferred at present.   No orders of the defined types were placed in this encounter.  Patient Instructions   Back pain is likely due to mechanical back pain or pain in the muscles lower spine.  That can happen from wear and tear but also from some tight muscles.  See information below.  Tylenol over-the-counter is fine for now, heat or ice to affected area as needed with gentle range of motion as tolerated.  If then if not improving in the next few weeks, please follow-up for repeat exam and possible x-rays.  Sooner if worse  Continue follow-up with your endocrinologist to discuss diabetes treatment.  Cologuard was ordered and will be shipped to  your house.  If that test is positive can discuss next step in testing.  If negative/normal, repeat in 3 years.   Acute Back Pain, Adult Acute back pain is sudden  and usually short-lived. It is often caused by an injury to the muscles and tissues in the back. The injury may result from:  A muscle or ligament getting overstretched or torn (strained). Ligaments are tissues that connect bones to each other. Lifting something improperly can cause a back strain.  Wear and tear (degeneration) of the spinal disks. Spinal disks are circular tissue that provides cushioning between the bones of the spine (vertebrae).  Twisting motions, such as while playing sports or doing yard work.  A hit to the back.  Arthritis. You may have a physical exam, lab tests, and imaging tests to find the cause of your pain. Acute back pain usually goes away with rest and home care. Follow these instructions at home: Managing pain, stiffness, and swelling  Take over-the-counter and prescription medicines only as told by your health care provider.  Your health care provider may recommend applying ice during the first 24-48 hours after your pain starts. To do this: ? Put ice in a plastic bag. ? Place a towel between your skin and the bag. ? Leave the ice on for 20 minutes, 2-3 times a day.  If directed, apply heat to the affected area as often as told by your health care provider. Use the heat source that your health care provider recommends, such as a moist heat pack or a heating pad. ? Place a towel between your skin and the heat source. ? Leave the heat on for 20-30 minutes. ? Remove the heat if your skin turns bright red. This is especially important if you are unable to feel pain, heat, or cold. You have a greater risk of getting burned. Activity   Do not stay in bed. Staying in bed for more than 1-2 days can delay your recovery.  Sit up and stand up straight. Avoid leaning forward when you sit, or hunching over when you stand. ? If you work at a desk, sit close to it so you do not need to lean over. Keep your chin tucked in. Keep your neck drawn back, and keep your  elbows bent at a right angle. Your arms should look like the letter "L." ? Sit high and close to the steering wheel when you drive. Add lower back (lumbar) support to your car seat, if needed.  Take short walks on even surfaces as soon as you are able. Try to increase the length of time you walk each day.  Do not sit, drive, or stand in one place for more than 30 minutes at a time. Sitting or standing for long periods of time can put stress on your back.  Do not drive or use heavy machinery while taking prescription pain medicine.  Use proper lifting techniques. When you bend and lift, use positions that put less stress on your back: ? [place] your knees. ? Keep the load close to your body. ? Avoid twisting.  Exercise regularly as told by your health care provider. Exercising helps your back heal faster and helps prevent back injuries by keeping muscles strong and flexible.  Work with a physical therapist to make a safe exercise program, as recommended by your health care provider. Do any exercises as told by your physical therapist. Lifestyle  Maintain a healthy weight. Extra weight puts stress on your back and  makes it difficult to have good posture.  Avoid activities or situations that make you feel anxious or stressed. Stress and anxiety increase muscle tension and can make back pain worse. Learn ways to manage anxiety and stress, such as through exercise. General instructions  Sleep on a firm mattress in a comfortable position. Try lying on your side with your knees slightly bent. If you lie on your back, put a pillow under your knees.  Follow your treatment plan as told by your health care provider. This may include: ? Cognitive or behavioral therapy. ? Acupuncture or massage therapy. ? Meditation or yoga. Contact a health care provider if:  You have pain that is not relieved with rest or medicine.  You have increasing pain going down into your legs or buttocks.  Your pain does  not improve after 2 weeks.  You have pain at night.  You lose weight without trying.  You have a fever or chills. Get help right away if:  You develop new bowel or bladder control problems.  You have unusual weakness or numbness in your arms or legs.  You develop nausea or vomiting.  You develop abdominal pain.  You feel faint. Summary  Acute back pain is sudden and usually short-lived.  Use proper lifting techniques. When you bend and lift, use positions that put less stress on your back.  Take over-the-counter and prescription medicines and apply heat or ice as directed by your health care provider. This information is not intended to replace advice given to you by your health care provider. Make sure you discuss any questions you have with your health care provider. Document Released: 01/28/2005 Document Revised: 05/19/2018 Document Reviewed: 09/11/2016 Elsevier Patient Education  [employer].    If you have lab work done today you will be contacted with your lab results within the next 2 weeks.  If you have not heard from [place] then please contact us. The fastest way to get your results is to register for My Chart.   IF you received an x-ray today, you will receive an invoice from [facility]. Please contact [facility] at [phone] with questions or concerns regarding your invoice.   IF you received labwork today, you will receive an invoice from [facility]. Please contact [facility] at [phone] with questions or concerns regarding your invoice.   Our billing staff will not be able to assist you with questions regarding bills from these companies.  You will be contacted with the lab results as soon as they are available. The fastest way to get your results is to activate your My Chart account. Instructions are located on the last page of this paperwork. If you have not heard from [place] regarding the results in 2 weeks, please contact this office.       Signed,   [name], MD [facility].  12/23/18 8:16 PM

## 2018-12-24 ENCOUNTER — Telehealth: Payer: Self-pay

## 2018-12-24 NOTE — Telephone Encounter (Signed)
Notified patient of message from Dr. Gherghe, patient expressed understanding and agreement. No further questions.  

## 2018-12-24 NOTE — Telephone Encounter (Signed)
Patient called in stating that his A1C is lower from the time in a 2 month span. Wants to know is that supposed to happen being that he was put on new medication. Wants to know does he need to lower the dosage or what needs to happen.   Please call and  advise

## 2018-12-31 DIAGNOSIS — Z1211 Encounter for screening for malignant neoplasm of colon: Secondary | ICD-10-CM | POA: Diagnosis not present

## 2019-01-04 NOTE — Progress Notes (Signed)
ERROR

## 2019-01-13 LAB — COLOGUARD: Cologuard: NEGATIVE

## 2019-01-15 DIAGNOSIS — E119 Type 2 diabetes mellitus without complications: Secondary | ICD-10-CM | POA: Diagnosis not present

## 2019-01-15 DIAGNOSIS — E785 Hyperlipidemia, unspecified: Secondary | ICD-10-CM | POA: Diagnosis not present

## 2019-01-15 DIAGNOSIS — I10 Essential (primary) hypertension: Secondary | ICD-10-CM | POA: Diagnosis not present

## 2019-01-15 DIAGNOSIS — I480 Paroxysmal atrial fibrillation: Secondary | ICD-10-CM | POA: Diagnosis not present

## 2019-01-26 ENCOUNTER — Encounter (HOSPITAL_BASED_OUTPATIENT_CLINIC_OR_DEPARTMENT_OTHER): Payer: Self-pay | Admitting: Orthopedic Surgery

## 2019-01-26 ENCOUNTER — Other Ambulatory Visit (HOSPITAL_COMMUNITY)
Admission: RE | Admit: 2019-01-26 | Discharge: 2019-01-26 | Disposition: A | Discharge status: Home / Self Care | Payer: BC Managed Care – PPO | Source: Ambulatory Visit | Attending: Orthopedic Surgery | Admitting: Orthopedic Surgery

## 2019-01-26 ENCOUNTER — Other Ambulatory Visit: Payer: Self-pay

## 2019-01-26 ENCOUNTER — Other Ambulatory Visit: Payer: Self-pay | Admitting: Orthopedic Surgery

## 2019-01-26 DIAGNOSIS — Z01812 Encounter for preprocedural laboratory examination: Secondary | ICD-10-CM | POA: Diagnosis not present

## 2019-01-26 DIAGNOSIS — E119 Type 2 diabetes mellitus without complications: Secondary | ICD-10-CM | POA: Diagnosis not present

## 2019-01-26 DIAGNOSIS — Z20828 Contact with and (suspected) exposure to other viral communicable diseases: Secondary | ICD-10-CM | POA: Insufficient documentation

## 2019-01-26 DIAGNOSIS — I10 Essential (primary) hypertension: Secondary | ICD-10-CM | POA: Diagnosis not present

## 2019-01-26 DIAGNOSIS — E785 Hyperlipidemia, unspecified: Secondary | ICD-10-CM | POA: Diagnosis not present

## 2019-01-27 ENCOUNTER — Encounter (HOSPITAL_BASED_OUTPATIENT_CLINIC_OR_DEPARTMENT_OTHER): Payer: Self-pay | Admitting: Orthopedic Surgery

## 2019-01-27 ENCOUNTER — Ambulatory Visit: Payer: BC Managed Care – PPO | Admitting: Internal Medicine

## 2019-01-27 ENCOUNTER — Encounter (HOSPITAL_BASED_OUTPATIENT_CLINIC_OR_DEPARTMENT_OTHER)
Admission: RE | Admit: 2019-01-27 | Discharge: 2019-01-27 | Disposition: A | Discharge status: Home / Self Care | Payer: BC Managed Care – PPO | Source: Ambulatory Visit | Attending: Orthopedic Surgery | Admitting: Orthopedic Surgery

## 2019-01-27 DIAGNOSIS — K219 Gastro-esophageal reflux disease without esophagitis: Secondary | ICD-10-CM | POA: Diagnosis not present

## 2019-01-27 DIAGNOSIS — Z794 Long term (current) use of insulin: Secondary | ICD-10-CM | POA: Diagnosis not present

## 2019-01-27 DIAGNOSIS — W1789XA Other fall from one level to another, initial encounter: Secondary | ICD-10-CM | POA: Diagnosis not present

## 2019-01-27 DIAGNOSIS — E669 Obesity, unspecified: Secondary | ICD-10-CM | POA: Diagnosis not present

## 2019-01-27 DIAGNOSIS — Z86711 Personal history of pulmonary embolism: Secondary | ICD-10-CM | POA: Diagnosis not present

## 2019-01-27 DIAGNOSIS — M94262 Chondromalacia, left knee: Secondary | ICD-10-CM | POA: Diagnosis not present

## 2019-01-27 DIAGNOSIS — I10 Essential (primary) hypertension: Secondary | ICD-10-CM | POA: Diagnosis not present

## 2019-01-27 DIAGNOSIS — Z79899 Other long term (current) drug therapy: Secondary | ICD-10-CM | POA: Diagnosis not present

## 2019-01-27 DIAGNOSIS — G4730 Sleep apnea, unspecified: Secondary | ICD-10-CM | POA: Diagnosis not present

## 2019-01-27 DIAGNOSIS — S83282A Other tear of lateral meniscus, current injury, left knee, initial encounter: Secondary | ICD-10-CM | POA: Diagnosis not present

## 2019-01-27 DIAGNOSIS — S83512A Sprain of anterior cruciate ligament of left knee, initial encounter: Secondary | ICD-10-CM | POA: Diagnosis present

## 2019-01-27 DIAGNOSIS — Z7901 Long term (current) use of anticoagulants: Secondary | ICD-10-CM | POA: Diagnosis not present

## 2019-01-27 DIAGNOSIS — S83242A Other tear of medial meniscus, current injury, left knee, initial encounter: Secondary | ICD-10-CM | POA: Diagnosis not present

## 2019-01-27 DIAGNOSIS — Z9884 Bariatric surgery status: Secondary | ICD-10-CM | POA: Diagnosis not present

## 2019-01-27 DIAGNOSIS — F17290 Nicotine dependence, other tobacco product, uncomplicated: Secondary | ICD-10-CM | POA: Diagnosis not present

## 2019-01-27 DIAGNOSIS — I4891 Unspecified atrial fibrillation: Secondary | ICD-10-CM | POA: Diagnosis not present

## 2019-01-27 DIAGNOSIS — Z6841 Body Mass Index (BMI) 40.0 and over, adult: Secondary | ICD-10-CM | POA: Diagnosis not present

## 2019-01-27 DIAGNOSIS — E119 Type 2 diabetes mellitus without complications: Secondary | ICD-10-CM | POA: Diagnosis not present

## 2019-01-27 LAB — BASIC METABOLIC PANEL
Anion gap: 10 (ref 5–15)
BUN: 11 mg/dL (ref 6–20)
CO2: 23 mmol/L (ref 22–32)
Calcium: 9.2 mg/dL (ref 8.9–10.3)
Chloride: 103 mmol/L (ref 98–111)
Creatinine, Ser: 0.91 mg/dL (ref 0.61–1.24)
GFR calc Af Amer: >60 mL/min (ref >60)
GFR calc non Af Amer: >60 mL/min (ref >60)
Glucose, Bld: 174 mg/dL — ABNORMAL HIGH (ref 70–99)
Potassium: 4.4 mmol/L (ref 3.5–5.1)
Sodium: 136 mmol/L (ref 135–145)

## 2019-01-27 LAB — NOVEL CORONAVIRUS, NAA (HOSP ORDER, SEND-OUT TO REF LAB; TAT 18-24 HRS): SARS-CoV-2, NAA: NOT DETECTED

## 2019-01-27 NOTE — Anesthesia Preprocedure Evaluation (Addendum)
Anesthesia Evaluation  Patient identified by MRN, date of birth, ID band Patient awake    Reviewed: Allergy & Precautions, NPO status , Patient's Chart, lab work & pertinent test results, reviewed documented beta blocker date and time   Airway Mallampati: I  TM Distance: >3 FB Neck ROM: Full    Dental  (+) Poor Dentition   Pulmonary shortness of breath, with exertion and lying, sleep apnea and Continuous Positive Airway Pressure Ventilation , Current Smoker and Patient abstained from smoking., PE Hx/o PTE   Pulmonary exam normal breath sounds clear to auscultation + decreased breath sounds      Cardiovascular hypertension, Pt. on medications and Pt. on home beta blockers Normal cardiovascular exam+ dysrhythmias Atrial Fibrillation  Rhythm:Regular Rate:Normal  Echo 01/25/2013 Left ventricle: The cavity size was normal. Wall thickness  was increased in a pattern of moderate LVH. There was  concentric hypertrophy. Systolic function was vigorous.  The estimated ejection fraction was in the range of 65% to 70%. Wall motion was normal; there were no regional wall motion abnormalities.   EKG 08/2018 Atrial Fibrillation. NS ST- T changes   Neuro/Psych  Neuromuscular disease negative psych ROS   GI/Hepatic Neg liver ROS, GERD  Medicated and Controlled,S/P gastric sleeve   Endo/Other  diabetes, Well Controlled, Type 2, Insulin DependentHyperlipidemia  Renal/GU negative Renal ROS  negative genitourinary   Musculoskeletal  (+) Arthritis , Osteoarthritis,  TMM and possible torn ACL left knee   Abdominal (+) + obese,   Peds  Hematology Xarelto therapy- last dose 12/15   Anesthesia Other Findings   Reproductive/Obstetrics                          Anesthesia Physical Anesthesia Plan  ASA: III  Anesthesia Plan: General   Post-op Pain Management:  Regional for Post-op pain   Induction:  Intravenous  PONV Risk Score and Plan: 2 and Ondansetron, Treatment may vary due to age or medical condition and Midazolam  Airway Management Planned: LMA and Oral ETT  Additional Equipment:   Intra-op Plan:   Post-operative Plan: Extubation in OR  Informed Consent: I have reviewed the patients History and Physical, chart, labs and discussed the procedure including the risks, benefits and alternatives for the proposed anesthesia with the patient or authorized representative who has indicated his/her understanding and acceptance.     Dental advisory given  Plan Discussed with: CRNA and Kollman  Anesthesia Plan Comments:        Anesthesia Quick Evaluation

## 2019-01-28 ENCOUNTER — Encounter (HOSPITAL_BASED_OUTPATIENT_CLINIC_OR_DEPARTMENT_OTHER): Payer: Self-pay | Admitting: Orthopedic Surgery

## 2019-01-28 NOTE — H&P (Signed)
A pre op hand p   Chief Complaint: left knee pain  HPI: Phillip Frye is a 48 y.o. male who presents for evaluation of left knee pain. It has been present for greater than 3 months and has been worsening. He has failed conservative measures. Pain is rated as moderate.  Past Medical History:  Diagnosis Date  . Arthritis   . Atrial fibrillation ([place])   . Diabetes mellitus without complication ([address])   . Dysrhythmia    unknown type  . GERD (gastroesophageal reflux disease)   . Hypertension    MD stopped BP med 1 yr ago due to normal BP  . Obesity   . Pulmonary embolism, bilateral ([place]) 09/2012   on xarelto  . Shortness of breath    with exertion  . Sleep apnea    uses c pap    Past Surgical History:  Procedure Laterality Date  . BREATH TEK H PYLORI N/A 12/28/2012   Procedure: BREATH TEK H PYLORI;  Blumenstein: Gayland Curry, MD;  Location: [facility];  Service: General;  Laterality: N/A;  . HIP SURGERY Bilateral    to "shave gthe bones"  . LAPAROSCOPIC GASTRIC SLEEVE RESECTION N/A 05/31/2013   Procedure: LAPAROSCOPIC GASTRIC SLEEVE RESECTION;  Broxterman: Gayland Curry, MD;  Location: [facility];  Service: General;  Laterality: N/A;   Social History   Socioeconomic History  . Marital status: Single    Spouse name: Not on file  . Number of children: 0  . Years of education: Not on file  . Highest education level: Not on file  Occupational History  . Not on file  Tobacco Use  . Smoking status: Current Every Day Smoker    Packs/day: 1.00    Years: 6.00    Pack years: 6.00    Types: Cigars  . Smokeless tobacco: Never Used  . Tobacco comment: 3 cigars a day  Substance and Sexual Activity  . Alcohol use: Yes    Comment: occasional  . Drug use: No  . Sexual activity: Not on file  Other Topics Concern  . Not on file  Social History Narrative   Lives alone.  Works at [employer].   Social Determinants of Health   Financial Resource Strain:   . Difficulty of Paying Living  Expenses: Not on file  Food Insecurity:   . Worried About Charity fundraiser in the Last Year: Not on file  . Ran Out of Food in the Last Year: Not on file  Transportation Needs:   . Lack of Transportation (Medical): Not on file  . Lack of Transportation (Non-Medical): Not on file  Physical Activity:   . Days of Exercise per Week: Not on file  . Minutes of Exercise per Session: Not on file  Stress:   . Feeling of Stress : Not on file  Social Connections:   . Frequency of Communication with Friends and Family: Not on file  . Frequency of Social Gatherings with Friends and Family: Not on file  . Attends Religious Services: Not on file  . Active Member of Clubs or Organizations: Not on file  . Attends Archivist Meetings: Not on file  . Marital Status: Not on file   Family History  Problem Relation Age of Onset  . Cirrhosis Father   . Diabetes Other   . Diabetes Other   . Emphysema Paternal Grandfather        smoked   No Known Allergies Prior to Admission medications  Medication Sig Start Date End Date Taking? Authorizing Provider  atorvastatin (LIPITOR) 10 MG tablet Take 1 tablet (10 mg total) by mouth daily at 6 PM. 10/22/18  Yes Wendie Agreste, MD  CINNAMON PO Take 500 mg by mouth 2 (two) times daily.   Yes [provider]  ELIQUIS 5 MG TABS tablet TAKE 1 TABLET BY MOUTH 2 TIMES DAILY FOR 30 DAYS 10/21/18  Yes Wendie Agreste, MD  losartan (COZAAR) 50 MG tablet Take 50 mg by mouth daily.   Yes [provider]  metoprolol succinate (TOPROL-XL) 50 MG 24 hr tablet Take 50 mg by mouth daily. Take with or immediately following a meal.   Yes [provider]  Semaglutide,0.25 or 0.5MG/DOS, (OZEMPIC, 0.25 OR 0.5 MG/DOSE,) 2 MG/1.5ML SOPN Inject 0.5 mg into the skin once a week. 10/28/18  Yes Shamleffer, Melanie Crazier, MD  levocetirizine (XYZAL) 5 MG tablet Take 1 tablet (5 mg total) by mouth every evening. 08/23/17 01/26/19 Yes Kennyth Arnold,  FNP  blood glucose meter kit and supplies Dispense based on patient and insurance preference. Use up to four times daily as directed. (FOR ICD-10 E10.9, E11.9). 01/15/18   Wendie Agreste, MD  Blood Glucose Monitoring Suppl (FREESTYLE LITE) DEVI  01/15/18   [provider]  glucose blood test strip Up to 3 times per day - DM2 with hyperglycemia. Uncontrolled. 10/22/18   Wendie Agreste, MD  Insulin Pen Needle (PEN NEEDLES) 32G X 4 MM MISC 1 application by Does not apply route daily. 10/22/18   Wendie Agreste, MD  Lancets (FREESTYLE) lancets  01/15/18   [provider]  amLODipine (NORVASC) 5 MG tablet Take 1 tablet (5 mg total) by mouth daily. 01/01/18 12/23/18  Wendie Agreste, MD     Positive ROS: none  All other systems have been reviewed and were otherwise negative with the exception of those mentioned in the HPI and as above.  Physical Exam: There were no vitals filed for this visit. Morbidly obese male with BMI greater than 40 General: Alert, no acute distress Cardiovascular: No pedal edema Respiratory: No cyanosis, no use of accessory musculature GI: No organomegaly, abdomen is soft and non-tender Skin: No lesions in the area of chief complaint Neurologic: Sensation intact distally Psychiatric: Patient is competent for consent with normal mood and affect Lymphatic: No axillary or cervical lymphadenopathy  MUSCULOSKELETAL: left knee: Painful range of motion.  Limited range of motion.  1+ effusion.   Difficult to assess instability based on patient's large size.  Assessment/Plan: The patient has.  We'll need it is unclear to me whether he is unstable.  The patient will need evaluation under anesthesia to assess his level of instability.  If he has instability with an anterior cruciate L reconstruction we performed.  If he has instability with significant arthritis we'll not perform ACL reconstruction.  He has negative pivot shift intraoperatively and any sign  of degeneration but I would not proceed with ACL reconstruction.   The patient is well aware of this situation. LEFT KNEE MEDIAL MENISCUS TEAR AND ANTERIOR CRUCIATE LIGAMENT TEAR Plan for Procedure(s): EXAM UNDER ANESTHESIA WITH KNEE ARTHROSCOPY, CHONDROPLASTY WITH PARTIAL MENISECTOMY WITH POSSIBLE ANTERIOR CRUCIATE LIGAMENT (ACL) RPAIR WITH HAMSTRING ALLOGRAFT  The risks benefits and alternatives were discussed with the patient including but not limited to the risks of nonoperative treatment, versus surgical intervention including infection, bleeding, nerve injury, malunion, nonunion, hardware prominence, hardware failure, need for hardware removal, blood clots, cardiopulmonary complications, morbidity,  mortality, among others, and they were willing to proceed.  Predicted outcome is good, although there will be at least a six to nine month expected recovery.  Alta Corning, MD 01/28/2019 11:03 PM

## 2019-01-29 ENCOUNTER — Ambulatory Visit (HOSPITAL_BASED_OUTPATIENT_CLINIC_OR_DEPARTMENT_OTHER): Payer: No Typology Code available for payment source | Admitting: Anesthesiology

## 2019-01-29 ENCOUNTER — Encounter (HOSPITAL_BASED_OUTPATIENT_CLINIC_OR_DEPARTMENT_OTHER)
Admission: RE | Disposition: A | Discharge status: Home / Self Care | Payer: Self-pay | Source: Home / Self Care | Attending: Orthopedic Surgery

## 2019-01-29 ENCOUNTER — Ambulatory Visit (HOSPITAL_BASED_OUTPATIENT_CLINIC_OR_DEPARTMENT_OTHER)
Admission: RE | Admit: 2019-01-29 | Discharge: 2019-01-29 | Disposition: A | Discharge status: Home / Self Care | Payer: No Typology Code available for payment source | Attending: Orthopedic Surgery | Admitting: Orthopedic Surgery

## 2019-01-29 ENCOUNTER — Encounter (HOSPITAL_BASED_OUTPATIENT_CLINIC_OR_DEPARTMENT_OTHER): Payer: Self-pay | Admitting: Orthopedic Surgery

## 2019-01-29 DIAGNOSIS — Z86711 Personal history of pulmonary embolism: Secondary | ICD-10-CM | POA: Insufficient documentation

## 2019-01-29 DIAGNOSIS — S83242A Other tear of medial meniscus, current injury, left knee, initial encounter: Secondary | ICD-10-CM | POA: Insufficient documentation

## 2019-01-29 DIAGNOSIS — Z9884 Bariatric surgery status: Secondary | ICD-10-CM | POA: Insufficient documentation

## 2019-01-29 DIAGNOSIS — S83512A Sprain of anterior cruciate ligament of left knee, initial encounter: Secondary | ICD-10-CM

## 2019-01-29 DIAGNOSIS — E119 Type 2 diabetes mellitus without complications: Secondary | ICD-10-CM | POA: Insufficient documentation

## 2019-01-29 DIAGNOSIS — Z6841 Body Mass Index (BMI) 40.0 and over, adult: Secondary | ICD-10-CM | POA: Insufficient documentation

## 2019-01-29 DIAGNOSIS — I4891 Unspecified atrial fibrillation: Secondary | ICD-10-CM | POA: Insufficient documentation

## 2019-01-29 DIAGNOSIS — Z794 Long term (current) use of insulin: Secondary | ICD-10-CM | POA: Insufficient documentation

## 2019-01-29 DIAGNOSIS — I10 Essential (primary) hypertension: Secondary | ICD-10-CM | POA: Insufficient documentation

## 2019-01-29 DIAGNOSIS — F17290 Nicotine dependence, other tobacco product, uncomplicated: Secondary | ICD-10-CM | POA: Insufficient documentation

## 2019-01-29 DIAGNOSIS — K219 Gastro-esophageal reflux disease without esophagitis: Secondary | ICD-10-CM | POA: Insufficient documentation

## 2019-01-29 DIAGNOSIS — S83282A Other tear of lateral meniscus, current injury, left knee, initial encounter: Secondary | ICD-10-CM | POA: Insufficient documentation

## 2019-01-29 DIAGNOSIS — M94262 Chondromalacia, left knee: Secondary | ICD-10-CM | POA: Diagnosis not present

## 2019-01-29 DIAGNOSIS — W1789XA Other fall from one level to another, initial encounter: Secondary | ICD-10-CM | POA: Insufficient documentation

## 2019-01-29 DIAGNOSIS — G4730 Sleep apnea, unspecified: Secondary | ICD-10-CM | POA: Insufficient documentation

## 2019-01-29 DIAGNOSIS — Z79899 Other long term (current) drug therapy: Secondary | ICD-10-CM | POA: Insufficient documentation

## 2019-01-29 DIAGNOSIS — E669 Obesity, unspecified: Secondary | ICD-10-CM | POA: Insufficient documentation

## 2019-01-29 DIAGNOSIS — Z7901 Long term (current) use of anticoagulants: Secondary | ICD-10-CM | POA: Insufficient documentation

## 2019-01-29 HISTORY — PX: KNEE ARTHROSCOPY WITH ANTERIOR CRUCIATE LIGAMENT (ACL) REPAIR: SHX5644

## 2019-01-29 HISTORY — DX: Unspecified atrial fibrillation: I48.91

## 2019-01-29 LAB — GLUCOSE, CAPILLARY
Glucose-Capillary: 132 mg/dL — ABNORMAL HIGH (ref 70–99)
Glucose-Capillary: 117 mg/dL — ABNORMAL HIGH (ref 70–99)

## 2019-01-29 SURGERY — KNEE ARTHROSCOPY WITH ANTERIOR CRUCIATE LIGAMENT (ACL) REPAIR
Anesthesia: General | Site: Knee | Laterality: Left

## 2019-01-29 MED ORDER — PROPOFOL 10 MG/ML IV BOLUS
INTRAVENOUS | Status: DC | PRN
  Administered 2019-01-29: 250 mg via INTRAVENOUS

## 2019-01-29 MED ORDER — FENTANYL CITRATE (PF) 100 MCG/2ML IJ SOLN
50.0000 ug | INTRAMUSCULAR | Status: DC | PRN
  Administered 2019-01-29: 50 ug via INTRAVENOUS

## 2019-01-29 MED ORDER — CEFAZOLIN SODIUM-DEXTROSE 2-4 GM/100ML-% IV SOLN
INTRAVENOUS | Status: AC
  Filled 2019-01-29: qty 200

## 2019-01-29 MED ORDER — BUPIVACAINE LIPOSOME 1.3 % IJ SUSP
INTRAMUSCULAR | Status: DC | PRN
  Administered 2019-01-29: 10 mL via PERINEURAL

## 2019-01-29 MED ORDER — DEXAMETHASONE SODIUM PHOSPHATE 10 MG/ML IJ SOLN
INTRAMUSCULAR | Status: AC
  Filled 2019-01-29: qty 1

## 2019-01-29 MED ORDER — PHENYLEPHRINE 40 MCG/ML (10ML) SYRINGE FOR IV PUSH (FOR BLOOD PRESSURE SUPPORT)
PREFILLED_SYRINGE | INTRAVENOUS | Status: DC | PRN
  Administered 2019-01-29: 120 ug via INTRAVENOUS
  Administered 2019-01-29: 80 ug via INTRAVENOUS
  Administered 2019-01-29: 40 ug via INTRAVENOUS
  Administered 2019-01-29 (×2): 80 ug via INTRAVENOUS

## 2019-01-29 MED ORDER — SODIUM CHLORIDE 0.9 % IR SOLN
Status: DC | PRN
  Administered 2019-01-29: 12000 mL

## 2019-01-29 MED ORDER — FENTANYL CITRATE (PF) 100 MCG/2ML IJ SOLN
25.0000 ug | INTRAMUSCULAR | Status: DC | PRN
  Administered 2019-01-29 (×2): 50 ug via INTRAVENOUS

## 2019-01-29 MED ORDER — FENTANYL CITRATE (PF) 100 MCG/2ML IJ SOLN
INTRAMUSCULAR | Status: AC
  Filled 2019-01-29: qty 2

## 2019-01-29 MED ORDER — DEXTROSE 5 % IV SOLN
3.0000 g | INTRAVENOUS | Status: AC
  Administered 2019-01-29: 3 g via INTRAVENOUS

## 2019-01-29 MED ORDER — PHENYLEPHRINE HCL-NACL 10-0.9 MG/250ML-% IV SOLN
INTRAVENOUS | Status: DC | PRN
  Administered 2019-01-29: 50 ug/min via INTRAVENOUS

## 2019-01-29 MED ORDER — EPHEDRINE 5 MG/ML INJ
INTRAVENOUS | Status: AC
  Filled 2019-01-29: qty 10

## 2019-01-29 MED ORDER — FENTANYL CITRATE (PF) 100 MCG/2ML IJ SOLN
INTRAMUSCULAR | Status: DC | PRN
  Administered 2019-01-29 (×8): 25 ug via INTRAVENOUS

## 2019-01-29 MED ORDER — EPINEPHRINE PF 1 MG/ML IJ SOLN
INTRAMUSCULAR | Status: AC
  Filled 2019-01-29: qty 2

## 2019-01-29 MED ORDER — CHLORHEXIDINE GLUCONATE 4 % EX LIQD: 60.0000 mL | Freq: Once | CUTANEOUS | Status: DC

## 2019-01-29 MED ORDER — OXYCODONE HCL 5 MG PO TABS
5.0000 mg | ORAL_TABLET | Freq: Once | ORAL | Status: AC
  Administered 2019-01-29: 5 mg via ORAL

## 2019-01-29 MED ORDER — PHENYLEPHRINE HCL (PRESSORS) 10 MG/ML IV SOLN
INTRAVENOUS | Status: AC
  Filled 2019-01-29: qty 2

## 2019-01-29 MED ORDER — MIDAZOLAM HCL 2 MG/2ML IJ SOLN
INTRAMUSCULAR | Status: AC
  Filled 2019-01-29: qty 2

## 2019-01-29 MED ORDER — EPHEDRINE SULFATE-NACL 50-0.9 MG/10ML-% IV SOSY
PREFILLED_SYRINGE | INTRAVENOUS | Status: DC | PRN
  Administered 2019-01-29 (×2): 10 mg via INTRAVENOUS

## 2019-01-29 MED ORDER — MIDAZOLAM HCL 2 MG/2ML IJ SOLN
1.0000 mg | INTRAMUSCULAR | Status: DC | PRN
  Administered 2019-01-29: 2 mg via INTRAVENOUS

## 2019-01-29 MED ORDER — BUPIVACAINE HCL (PF) 0.25 % IJ SOLN
INTRAMUSCULAR | Status: AC
  Filled 2019-01-29: qty 30

## 2019-01-29 MED ORDER — KETOROLAC TROMETHAMINE 30 MG/ML IJ SOLN
INTRAMUSCULAR | Status: DC | PRN
  Administered 2019-01-29: 30 mg via INTRAVENOUS

## 2019-01-29 MED ORDER — LIDOCAINE 2% (20 MG/ML) 5 ML SYRINGE
INTRAMUSCULAR | Status: DC | PRN
  Administered 2019-01-29: 100 mg via INTRAVENOUS

## 2019-01-29 MED ORDER — LIDOCAINE 2% (20 MG/ML) 5 ML SYRINGE
INTRAMUSCULAR | Status: AC
  Filled 2019-01-29: qty 5

## 2019-01-29 MED ORDER — ONDANSETRON HCL 4 MG/2ML IJ SOLN
INTRAMUSCULAR | Status: AC
  Filled 2019-01-29: qty 2

## 2019-01-29 MED ORDER — ONDANSETRON HCL 4 MG/2ML IJ SOLN
INTRAMUSCULAR | Status: DC | PRN
  Administered 2019-01-29 (×2): 4 mg via INTRAVENOUS

## 2019-01-29 MED ORDER — KETOROLAC TROMETHAMINE 30 MG/ML IJ SOLN
INTRAMUSCULAR | Status: AC
  Filled 2019-01-29: qty 1

## 2019-01-29 MED ORDER — OXYCODONE-ACETAMINOPHEN 5-325 MG PO TABS: 1.0000 | ORAL_TABLET | Freq: Four times a day (QID) | ORAL | 0 refills | Status: DC | PRN

## 2019-01-29 MED ORDER — PHENYLEPHRINE 40 MCG/ML (10ML) SYRINGE FOR IV PUSH (FOR BLOOD PRESSURE SUPPORT)
PREFILLED_SYRINGE | INTRAVENOUS | Status: AC
  Filled 2019-01-29: qty 10

## 2019-01-29 MED ORDER — DEXAMETHASONE SODIUM PHOSPHATE 10 MG/ML IJ SOLN
INTRAMUSCULAR | Status: DC | PRN
  Administered 2019-01-29: 5 mg via INTRAVENOUS

## 2019-01-29 MED ORDER — LACTATED RINGERS IV SOLN: INTRAVENOUS | Status: DC

## 2019-01-29 MED ORDER — SODIUM CHLORIDE 0.9 % IR SOLN
Status: DC | PRN
  Administered 2019-01-29: 6000 mL

## 2019-01-29 MED ORDER — ONDANSETRON HCL 4 MG/2ML IJ SOLN: 4.0000 mg | Freq: Once | INTRAMUSCULAR | Status: DC | PRN

## 2019-01-29 MED ORDER — PROPOFOL 500 MG/50ML IV EMUL
INTRAVENOUS | Status: AC
  Filled 2019-01-29: qty 50

## 2019-01-29 MED ORDER — OXYCODONE HCL 5 MG PO TABS
ORAL_TABLET | ORAL | Status: AC
  Filled 2019-01-29: qty 1

## 2019-01-29 MED ORDER — BUPIVACAINE HCL 0.5 % IJ SOLN
INTRAMUSCULAR | Status: DC | PRN
  Administered 2019-01-29: 20 mL

## 2019-01-29 SURGICAL SUPPLY — 72 items
ALLOGRAFT GRFTLNK IMPLANT SYST (Anchor) IMPLANT
ANCHOR BUTTON TIGHTROPE RN 14 (Anchor) ×1 IMPLANT
APL SKNCLS STERI-STRIP NONHPOA (GAUZE/BANDAGES/DRESSINGS) ×2
BANDAGE ESMARK 6X9 LF (GAUZE/BANDAGES/DRESSINGS) IMPLANT
BENZOIN TINCTURE PRP APPL 2/3 (GAUZE/BANDAGES/DRESSINGS) ×3 IMPLANT
BLADE EXCALIBUR 4.0X13 (MISCELLANEOUS) ×3 IMPLANT
BLADE SURG 15 STRL LF DISP TIS (BLADE) ×2 IMPLANT
BLADE SURG 15 STRL SS (BLADE) ×3
BNDG CMPR 9X6 STRL LF SNTH (GAUZE/BANDAGES/DRESSINGS)
BNDG ESMARK 6X9 LF (GAUZE/BANDAGES/DRESSINGS)
BURR OVAL 8 FLU 5.0X13 (MISCELLANEOUS) ×1 IMPLANT
COVER BACK TABLE REUSABLE LG (DRAPES) ×3 IMPLANT
COVER WAND RF STERILE (DRAPES) IMPLANT
DISSECTOR  3.8MM X 13CM (MISCELLANEOUS)
DISSECTOR 3.8MM X 13CM (MISCELLANEOUS) IMPLANT
DRAIN PENROSE 1/4X12 LTX STRL (WOUND CARE) IMPLANT
DRAPE ARTHROSCOPY W/POUCH 90 (DRAPES) ×3 IMPLANT
DRAPE HALF SHEET 70X43 (DRAPES) IMPLANT
DRSG EMULSION OIL 3X3 NADH (GAUZE/BANDAGES/DRESSINGS) ×3 IMPLANT
DURAPREP 26ML APPLICATOR (WOUND CARE) ×3 IMPLANT
DW OUTFLOW CASSETTE/TUBE SET (MISCELLANEOUS) ×1 IMPLANT
ELECT MENISCUS 165MM 90D (ELECTRODE) IMPLANT
ELECT REM PT RETURN 9FT ADLT (ELECTROSURGICAL)
ELECTRODE REM PT RTRN 9FT ADLT (ELECTROSURGICAL) IMPLANT
GAUZE SPONGE 4X4 12PLY STRL (GAUZE/BANDAGES/DRESSINGS) ×3 IMPLANT
GLOVE BIOGEL PI IND STRL 8 (GLOVE) ×4 IMPLANT
GLOVE BIOGEL PI INDICATOR 8 (GLOVE) ×2
GLOVE ECLIPSE 7.5 STRL STRAW (GLOVE) ×8 IMPLANT
GOWN STRL REUS W/ TWL LRG LVL3 (GOWN DISPOSABLE) ×2 IMPLANT
GOWN STRL REUS W/ TWL XL LVL3 (GOWN DISPOSABLE) ×2 IMPLANT
GOWN STRL REUS W/TWL LRG LVL3 (GOWN DISPOSABLE) ×3
GOWN STRL REUS W/TWL XL LVL3 (GOWN DISPOSABLE) ×6 IMPLANT
GRAFT TISS 60-80 FRZN TENDON (Tissue) IMPLANT
HOLDER KNEE FOAM BLUE (MISCELLANEOUS) ×3 IMPLANT
IMMOBILIZER KNEE 22 UNIV (SOFTGOODS) IMPLANT
IMMOBILIZER KNEE 24 THIGH 36 (MISCELLANEOUS) IMPLANT
IMMOBILIZER KNEE 24 UNIV (MISCELLANEOUS) ×3
IV NS IRRIG 3000ML ARTHROMATIC (IV SOLUTION) ×10 IMPLANT
KIT TRANSTIBIAL (DISPOSABLE) ×1 IMPLANT
KNEE WRAP E Z 3 GEL PACK (MISCELLANEOUS) ×3 IMPLANT
MANIFOLD NEPTUNE II (INSTRUMENTS) ×3 IMPLANT
NDL SAFETY ECLIPSE 18X1.5 (NEEDLE) ×2 IMPLANT
NEEDLE HYPO 18GX1.5 SHARP (NEEDLE) ×3
PACK ARTHROSCOPY DSU (CUSTOM PROCEDURE TRAY) ×3 IMPLANT
PACK BASIN DAY SURGERY FS (CUSTOM PROCEDURE TRAY) ×3 IMPLANT
PAD CAST 4YDX4 CTTN HI CHSV (CAST SUPPLIES) ×4 IMPLANT
PADDING CAST COTTON 4X4 STRL (CAST SUPPLIES) ×6
PASSER SUT SWANSON 36MM LOOP (INSTRUMENTS) IMPLANT
PENCIL SMOKE EVACUATOR (MISCELLANEOUS) IMPLANT
PK GRAFTLINK ALLO IMPLANT SYST (Anchor) ×3 IMPLANT
SPONGE LAP 4X18 RFD (DISPOSABLE) ×3 IMPLANT
STRIP CLOSURE SKIN 1/2X4 (GAUZE/BANDAGES/DRESSINGS) ×3 IMPLANT
SUCTION FRAZIER HANDLE 10FR (MISCELLANEOUS) ×1
SUCTION TUBE FRAZIER 10FR DISP (MISCELLANEOUS) ×2 IMPLANT
SUT FIBERWIRE #2 38 T-5 BLUE (SUTURE)
SUT MNCRL AB 3-0 PS2 18 (SUTURE) ×3 IMPLANT
SUT PDS AB 1 CT  36 (SUTURE)
SUT PDS AB 1 CT 36 (SUTURE) IMPLANT
SUT VIC AB 0 CT1 27 (SUTURE)
SUT VIC AB 0 CT1 27XBRD ANBCTR (SUTURE) ×6 IMPLANT
SUT VIC AB 0 SH 27 (SUTURE) IMPLANT
SUT VIC AB 2-0 SH 27 (SUTURE) ×3
SUT VIC AB 2-0 SH 27XBRD (SUTURE) ×2 IMPLANT
SUTURE FIBERWR #2 38 T-5 BLUE (SUTURE) ×4 IMPLANT
SUTURE TAPE 1.3 FIBERLOP 20 ST (SUTURE) ×8 IMPLANT
SUTURETAPE 1.3 FIBERLOOP 20 ST (SUTURE)
SYR 5ML LL (SYRINGE) ×3 IMPLANT
TISSUE GRAFTLINK FGL (Tissue) ×3 IMPLANT
TOWEL GREEN STERILE FF (TOWEL DISPOSABLE) ×6 IMPLANT
TUBING ARTHROSCOPY IRRIG 16FT (MISCELLANEOUS) ×3 IMPLANT
WATER STERILE IRR 1000ML POUR (IV SOLUTION) ×2 IMPLANT
YANKAUER SUCT BULB TIP NO VENT (SUCTIONS) IMPLANT

## 2019-01-29 NOTE — Anesthesia Postprocedure Evaluation (Signed)
Anesthesia Post Note  Patient: Phillip Frye  Procedure(s) Performed: KNEE ARTHROSCOPY WITH CHONDROPLASTY AND  ANTERIOR CRUCIATE LIGAMENT (ACL) RPAIR WITH GRAFTLINK (Left Knee) EXAM UNDER ANESTHESIA (Left Knee)     Patient location during evaluation: PACU Anesthesia Type: General Level of consciousness: awake and alert and oriented Pain management: pain level controlled Vital Signs Assessment: post-procedure vital signs reviewed and stable Respiratory status: spontaneous breathing, nonlabored ventilation and respiratory function stable Cardiovascular status: blood pressure returned to baseline and stable Postop Assessment: no apparent nausea or vomiting Anesthetic complications: no    Last Vitals:  Vitals:   01/29/19 1015 01/29/19 1030  BP: (!) 142/99 136/82  Pulse: 87 88  Resp: (!) 22 17  Temp:    SpO2: 100% 96%    Last Pain:  Vitals:   01/29/19 1030  TempSrc:   PainSc: 2                  Evelene Roussin A.

## 2019-01-29 NOTE — Transfer of Care (Signed)
Immediate Anesthesia Transfer of Care Note  Patient: Phillip Frye  Procedure(s) Performed: KNEE ARTHROSCOPY WITH CHONDROPLASTY AND  ANTERIOR CRUCIATE LIGAMENT (ACL) RPAIR WITH GRAFTLINK (Left Knee) EXAM UNDER ANESTHESIA (Left Knee)  Patient Location: PACU  Anesthesia Type:GA combined with regional for post-op pain  Level of Consciousness: awake, alert  and oriented  Airway & Oxygen Therapy: Patient Spontanous Breathing and Patient connected to face mask oxygen  Post-op Assessment: Report given to RN and Post -op Vital signs reviewed and stable  Post vital signs: Reviewed and stable  Last Vitals:  Vitals Value Taken Time  BP 118/77   Temp    Pulse 99   Resp 19   SpO2 100%     Last Pain:  Vitals:   01/29/19 0644  TempSrc: Temporal  PainSc: 0-No pain         Complications: No apparent anesthesia complications

## 2019-01-29 NOTE — Brief Op Note (Signed)
01/29/2019  9:34 AM  PATIENT:  Phillip Frye  48 y.o. male  PRE-OPERATIVE DIAGNOSIS:  LEFT KNEE MEDIAL MENISCUS TEAR AND ANTERIOR CRUCIATE LIGAMENT TEAR  POST-OPERATIVE DIAGNOSIS:  LEFT KNEE MEDIAL MENISCUS TEAR AND ANTERIOR CRUCIATE LIGAMENT TEAR  PROCEDURE:  Procedure(s): KNEE ARTHROSCOPY WITH CHONDROPLASTY AND  ANTERIOR CRUCIATE LIGAMENT (ACL) RPAIR WITH GRAFTLINK (Left) EXAM UNDER ANESTHESIA (Left)  Spadaccini:  Sweetin(s) and Role:    Dorna Leitz, MD - Primary  PHYSICIAN ASSISTANT:   ASSISTANTS: jim bethune   ANESTHESIA:   general  EBL:  minimal   BLOOD ADMINISTERED:none  DRAINS: none   LOCAL MEDICATIONS USED:  MARCAINE     SPECIMEN:  No Specimen  DISPOSITION OF SPECIMEN:  N/A  COUNTS:  YES  TOURNIQUET:  * No tourniquets in log *  DICTATION: .Other Dictation: Dictation Number [id number]  PLAN OF CARE: Discharge to home after PACU  PATIENT DISPOSITION:  PACU - hemodynamically stable.   Delay start of Pharmacological VTE agent (>24hrs) due to surgical blood loss or risk of bleeding: no

## 2019-01-29 NOTE — Anesthesia Procedure Notes (Signed)
Anesthesia Regional Block: Adductor canal block   Pre-Anesthetic Checklist: ,, timeout performed, Correct Patient, Correct Site, Correct Laterality, Correct Procedure, Correct Position, site marked, Risks and benefits discussed,  Surgical consent,  Pre-op evaluation,  At Fair's request and post-op pain management  Laterality: Left  Prep: chloraprep       Needles:  Injection technique: Single-shot  Needle Type: Echogenic Stimulator Needle     Needle Length: 9cm  Needle Gauge: 21   Needle insertion depth: 8 cm   Additional Needles:   Procedures:,,,, ultrasound used (permanent image in chart),,,,  Narrative:  Start time: 01/29/2019 7:18 AM End time: 01/29/2019 7:22 AM Injection made incrementally with aspirations every 5 mL.  Performed by: Personally  Anesthesiologist: Josephine Igo, MD  Additional Notes: Timeout performed. Patient sedated. Relevant anatomy ID'd using [place]. Incremental 2-42ml injection of LA with frequent aspiration. Patient tolerated procedure well.        Left Adductor Canal Block

## 2019-01-29 NOTE — Interval H&P Note (Signed)
History and Physical Interval Note:  01/29/2019 7:30 AM  Phillip Frye  has presented today for surgery, with the diagnosis of LEFT KNEE MEDIAL MENISCUS [place].  The various methods of treatment have been discussed with the patient and family. After consideration of risks, benefits and other options for treatment, the patient has consented to  Procedure(s): EXAM UNDER ANESTHESIA WITH KNEE ARTHROSCOPY, CHONDROPLASTY WITH PARTIAL MENISECTOMY WITH POSSIBLE ANTERIOR CRUCIATE LIGAMENT (ACL) RPAIR WITH HAMSTRING ALLOGRAFT (Left) as a surgical intervention.  The patient's history has been reviewed, patient examined, no change in status, stable for surgery.  I have reviewed the patient's chart and labs.  Questions were answered to the patient's satisfaction.     Alta Corning

## 2019-01-29 NOTE — Op Note (Signed)
NAME: Phillip Frye, Phillip Frye MEDICAL RECORD [id number] ACCOUNT [id number] DATE OF BIRTH:1970-03-03 FACILITY: [facility] LOCATION: [facility] PHYSICIAN:Lanetra Hartley L. Demira Gwynne, MD  OPERATIVE REPORT  DATE OF PROCEDURE:  01/29/2019  PREOPERATIVE DIAGNOSES:   1.  Anterior cruciate ligament tear. 2.  Medial meniscal tear. 3.  Lateral meniscal tear. 4.  Chondromalacia medial femoral condyle, all left.  POSTOPERATIVE DIAGNOSES: 1.  Anterior cruciate ligament tear. 2.  Medial meniscal tear. 3.  Lateral meniscal tear. 4.  Chondromalacia medial femoral condyle, all left.  PROCEDURES:   1.  Left knee arthroscopy with anterior cruciate ligament reconstruction with hamstring allograft. 2.  Partial medial and partial lateral meniscectomies. 3.  Chondroplasty medial femoral condyle. 4.  Interpretation of multiple intraoperative fluoroscopic images.    Beagle:  Dorna Leitz, MD  ASSISTANT:  Gaspar Skeeters PA-C, who was present for the entire case and assisted by preparation of the graft, manipulation of the leg and closing to minimize OR time.  BRIEF HISTORY:  The patient is a 48 year old male with a history of falling 100 feet by his report in  a bucket truck.  He sort of landed and had a buckling of his leg.  Ultimately, MRI showed that he had an ACL tear, which was new, with classic bone  bruise patterns.  He had medial meniscal tear and posterior horn of the lateral meniscal tear.  There was some question about chondromalacia, although his plain standing films did not show significant bone-on-bone change or significant narrowing.  We had  a long discussion about treatment options.  I was not certain that he was having instability.  I thought it was predominantly pain.  We brought him to the operating room for exam under anesthesia and fixation as needed.  DESCRIPTION OF PROCEDURE:  The patient brought to the operating room.  After adequate anesthesia was obtained with a general anesthetic, the patient was placed  supine on the operating table.  Left leg was examined under anesthesia and noted to be Lachman  positive and pivot positive.  At this time, the left leg was prepped and draped in the usual sterile fashion and routine arthroscopic examination revealed that there was a bucket handle medial meniscal tear with a flap anteriorly.  This was debrided  with a straight biting forceps back to a smooth and stable rim and we debrided that with a suction shaver.  I looked in the back.  The posterior flap of the meniscus had been I think macerated away.  Following this, attention was turned towards the notch  where the ACL was incompetent as anticipated.  Attention turned laterally.  There was a posterolateral meniscal tear which was debrided back to a smooth and stable rim just in the posterolateral corner.  At this point, we inspected the articular  surfaces.  A little bit of fray on the lateral tibial plateau.  I left that alone.  There was 1 little channel that looked like it could go deep, but I felt that it was probably best to leave it alone.  I went back medially.  He did have some grade III  change on the medial femoral condyle over a not insignificant area, probably 2 cm2.  We debrided with a suction shaver back to a smooth and stable rim.  We then went into the notch where a notchplasty was performed with a combination of a motorized bur  and a suction shaver.  Once this was done, a small incision was made just distal to the medial portal.  Subcutaneous  tissue was dissected down to the level of the bone and the tibial guide was then used and a pin was placed into the footprint of the old  ACL.  Following that, the tunnel was reamed to 10, dilated to 10 and then attention turned towards over the top guide and we passed the Beath needle out the distal lateral femur, drilled a tunnel here to 10, dilated it to 10 and at this point, we passed  the graft and the Endobutton out the distal lateral femoral cortex,  flipped the Endobutton under fluoroscopic guidance and then hoisted the graft up into the 20 mm tunnel that was drilled on the femoral side.  Once this was done, we took the 14 mm cover  and placed it over the hole distally and then cinched it down right to the bone and then tied the graft here.  I looked back in the knee.  We had perfect 20 mm of graft into each of the tunnels and an excellent placed ACL at this point.  I tightened up  both of the ends of the graft at this point and then cycled the knee and then tied off on the tibial side and cut those sutures, then rehoisted on the lateral side and then cut those sutures.  The final images were taken and final x-rays were taken.    At this point, as we said, the sutures were cut and the instruments were removed.  The knee was copiously irrigated and then suctioned dry.  The incisions were closed with interrupted suture and  subcuticular closure.  A sterile compressive dressing was  applied and the patient was taken to recovery, was noted to be in satisfactory condition.  Estimated blood loss for the procedure was minimal.  VN/NUANCE  D:01/29/2019 T:01/29/2019 JOB:[id number]

## 2019-01-29 NOTE — Progress Notes (Signed)
AssistedDr. Foster with left, ultrasound guided, adductor canal block. Side rails up, monitors on throughout procedure. See vital signs in flow sheet. Tolerated Procedure well.  

## 2019-01-29 NOTE — Discharge Instructions (Signed)
Do not take your Eliquis today.  You can resume your Eliquis dose tomorrow morning as usual.  Call the office [phone] to make an appointment to see Rosanne Ashing and Dr. Luiz Blare on Monday morning.  POST-OP ACL RECONSTRUCTION   2. Leave the steri-strips in place over your incisions when performing dressing changes and showering. Remove your dressings tomorrow to perform first dressings change. Then change dressing daily or as needed. You may shower and get incision wet on day 5 from surgery. Do not submerge incision in tub or under water.  3. Wear your knee immobilizer or hinged knee brace to sleep and at all times while up. You may put full weight on operative leg with brace on. Crutches are for comfort. Remove brace to perform attached exercises.  4. It is strongly recommended to ice and elevate your leg on a regular basis and at a minimum of 4 times a day for 30 minute sessions. You should ice after your exercise sessions and more if you are having a lot of pain or increase in swelling.  5. The thigh high Ted hose (white stockings) help to decrease swelling and prevent blood clots. It is recommended that you wear them on both legs for the first 3 days. Then you should wear on the operative extremity when upright but can remove to sleep.  6. If you had a block pre-operatively to provide post-op pain relief you may want to go ahead and begin utilizing your pain meds as your arm begins to wake up. Blocks can sometimes last up to 16-18 hours. If you are still pain-free prior to going to bed you may want to strongly consider taking a pain medication to avoid being awakened in the night with the onset of pain. A muscle relaxant is also provided for you should you experience muscle spasms. It is recommended that if you are experiencing pain that your pain medication alone is not controlling, add the muscle relaxant along with the pain medication which can give additional pain relief. The first one to two days is  generally the most severe of your pain and then should gradually decrease. As your pain lessens it is recommended that you decrease your use of the pain medications to an "as needed basis" only and to always comply with the recommended dosages of the pain medications.  7. Pain medications can produce constipation along with their use. If you experience this, the use of an over the counter stool softener or laxative daily is recommended.   8. For additional questions or concerns, please do not hesitate to call the office. If after hours there is an answering service to forward your concerns to the physician on call.   POST-OP EXERCISES  Repeat each exercise 10 times per set. Do 3 sets per session. Do 3 sessions per day.  Ankle/Foot Range of Motion  With leg relaxed, gently flex and extend ankle. Move through full range of motion.     Knee Extension Mobilization: Towel Prop  With a small towel rolled under your ankle, relax your leg to feel a comfortable stretch along the backside of your knee/leg. Hold for 3-5 minutes.     Hip/Knee Strengthening: Quadriceps Set  Tighten your quadriceps (top of thigh) by pulling your patella (kneecap) toward your hip. Keep your buttocks relaxed. Hold for 5-10 seconds.     Hip/Knee Strengthening: Straight Leg Raise  With your brace on, tighten your quadriceps and lift your leg 12-18 inches from surface.  Hip/Knee Stretching: Calf - Towel  Sit with knee straight and towel looped around your foot. Gently pull on towel until stretch is felt in calf. Hold for 20-30 seconds.     Hip/Knee: Knee Flexion  With a towel around your heel, gently pull knee up with towel until stretch is felt. Hold 20-30 seconds.     Regional Anesthesia Blocks  1. Numbness or the inability to move the "blocked" extremity may last from 3-48 hours after placement. The length of time depends on the medication injected and your individual response to the  medication. If the numbness is not going away after 48 hours, call your [facility].  2. The extremity that is blocked will need to be protected until the numbness is gone and the  Strength has returned. Because you cannot feel it, you will need to take extra care to avoid injury. Because it may be weak, you may have difficulty moving it or using it. You may not know what position it is in without looking at it while the block is in effect.  3. For blocks in the legs and feet, returning to weight bearing and walking needs to be done carefully. You will need to wait until the numbness is entirely gone and the strength has returned. You should be able to move your leg and foot normally before you try and bear weight or walk. You will need someone to be with you when you first try to ensure you do not fall and possibly risk injury.  4. Bruising and tenderness at the needle site are common side effects and will resolve in a few days.  5. Persistent numbness or new problems with movement should be communicated to the [facility] or the [facility] [phone])/ [facility] [phone]).  Post Anesthesia Home Care Instructions  Activity: Get plenty of rest for the remainder of the day. A responsible individual must stay with you for 24 hours following the procedure.  For the next 24 hours, DO NOT: -Drive a car -Advertising copywriterperate machinery -Drink alcoholic beverages -Take any medication unless instructed by your physician -Make any legal decisions or sign important papers.  Meals: Start with liquid foods such as gelatin or soup. Progress to regular foods as tolerated. Avoid greasy, spicy, heavy foods. If nausea and/or vomiting occur, drink only clear liquids until the nausea and/or vomiting subsides. Call your physician if vomiting continues.  Special Instructions/Symptoms: Your throat may feel dry or sore from the anesthesia or the breathing tube placed in your throat during surgery. If  this causes discomfort, gargle with warm salt water. The discomfort should disappear within 24 hours.  If you had a scopolamine patch placed behind your ear for the management of post- operative nausea and/or vomiting:  1. The medication in the patch is effective for 72 hours, after which it should be removed.  Wrap patch in a tissue and discard in the trash. Wash hands thoroughly with soap and water. 2. You may remove the patch earlier than 72 hours if you experience unpleasant side effects which may include dry mouth, dizziness or visual disturbances. 3. Avoid touching the patch. Wash your hands with soap and water after contact with the patch.  Information for Discharge Teaching: EXPAREL (bupivacaine liposome injectable suspension)   Your [facility] or anesthesiologist gave you EXPAREL(bupivacaine) to help control your pain after surgery.   EXPAREL is a local anesthetic that provides pain relief by numbing the tissue around the surgical site.  EXPAREL is designed  to release pain medication over time and can control pain for up to 72 hours.  Depending on how you respond to EXPAREL, you may require less pain medication during your recovery.  Possible side effects:  Temporary loss of sensation or ability to move in the area where bupivacaine was injected.  Nausea, vomiting, constipation  Rarely, numbness and tingling in your mouth or lips, lightheadedness, or anxiety may occur.  Call your doctor right away if you think you may be experiencing any of these sensations, or if you have other questions regarding possible side effects.  Follow all other discharge instructions given to you by your Ranganathan or nurse. Eat a healthy diet and drink plenty of water or other fluids.  If you return to the hospital for any reason within 96 hours following the administration of EXPAREL, it is important for health care providers to know that you have received this anesthetic. A teal colored band has been  placed on your arm with the date, time and amount of EXPAREL you have received in order to alert and inform your health care providers. Please leave this armband in place for the full 96 hours following administration, and then you may remove the band.

## 2019-01-29 NOTE — Anesthesia Procedure Notes (Signed)
Procedure Name: LMA Insertion Date/Time: 01/29/2019 7:42 AM Performed by: Genelle Bal, CRNA Pre-anesthesia Checklist: Patient identified, Emergency Drugs available, Suction available and Patient being monitored Patient Re-evaluated:Patient Re-evaluated prior to induction Oxygen Delivery Method: Circle system utilized Preoxygenation: Pre-oxygenation with 100% oxygen Induction Type: IV induction Ventilation: Mask ventilation without difficulty LMA: LMA inserted LMA Size: 5.0 Number of attempts: 1 Airway Equipment and Method: Bite block Placement Confirmation: positive ETCO2 Tube secured with: Tape Dental Injury: Teeth and Oropharynx as per pre-operative assessment

## 2019-02-01 ENCOUNTER — Encounter: Payer: Self-pay | Admitting: *Deleted

## 2019-02-05 ENCOUNTER — Emergency Department (HOSPITAL_COMMUNITY): Payer: BC Managed Care – PPO

## 2019-02-05 ENCOUNTER — Emergency Department (HOSPITAL_COMMUNITY)
Admission: EM | Admit: 2019-02-05 | Discharge: 2019-02-05 | Disposition: A | Discharge status: Home / Self Care | Payer: BC Managed Care – PPO | Attending: Emergency Medicine | Admitting: Emergency Medicine

## 2019-02-05 ENCOUNTER — Other Ambulatory Visit: Payer: Self-pay

## 2019-02-05 ENCOUNTER — Emergency Department (HOSPITAL_BASED_OUTPATIENT_CLINIC_OR_DEPARTMENT_OTHER): Payer: BC Managed Care – PPO

## 2019-02-05 ENCOUNTER — Encounter (HOSPITAL_COMMUNITY): Payer: Self-pay

## 2019-02-05 ENCOUNTER — Other Ambulatory Visit: Payer: Self-pay | Admitting: Family Medicine

## 2019-02-05 DIAGNOSIS — Z79899 Other long term (current) drug therapy: Secondary | ICD-10-CM | POA: Diagnosis not present

## 2019-02-05 DIAGNOSIS — F17290 Nicotine dependence, other tobacco product, uncomplicated: Secondary | ICD-10-CM | POA: Insufficient documentation

## 2019-02-05 DIAGNOSIS — I10 Essential (primary) hypertension: Secondary | ICD-10-CM | POA: Insufficient documentation

## 2019-02-05 DIAGNOSIS — M7989 Other specified soft tissue disorders: Secondary | ICD-10-CM

## 2019-02-05 DIAGNOSIS — M79609 Pain in unspecified limb: Secondary | ICD-10-CM | POA: Diagnosis not present

## 2019-02-05 DIAGNOSIS — Z9189 Other specified personal risk factors, not elsewhere classified: Secondary | ICD-10-CM

## 2019-02-05 DIAGNOSIS — E1165 Type 2 diabetes mellitus with hyperglycemia: Secondary | ICD-10-CM

## 2019-02-05 DIAGNOSIS — Z794 Long term (current) use of insulin: Secondary | ICD-10-CM

## 2019-02-05 DIAGNOSIS — E119 Type 2 diabetes mellitus without complications: Secondary | ICD-10-CM | POA: Insufficient documentation

## 2019-02-05 DIAGNOSIS — M25562 Pain in left knee: Secondary | ICD-10-CM | POA: Diagnosis not present

## 2019-02-05 NOTE — Progress Notes (Signed)
VASCULAR LAB PRELIMINARY  PRELIMINARY  PRELIMINARY  PRELIMINARY  Left lower extremity venous duplex completed.    Preliminary report:  See CV proc for preliminary results.  [facility] ED provider report.  Donnette Macmullen, RVT 02/05/2019, 7:18 PM

## 2019-02-05 NOTE — ED Notes (Signed)
US at bedside

## 2019-02-05 NOTE — ED Triage Notes (Signed)
Pt had left knee surgery 1 week ago. Pt states that he had to have 19ml of blood drained off on Monday. Pt states that today, he is concerned he needs more drained off, as the knee has swollen more and he is having pain in his left shin. Pt ambulatory with crutches to triage room.

## 2019-02-05 NOTE — ED Provider Notes (Signed)
[facility] DEPT Provider Note   CSN: [id number] Arrival date & time: 02/05/19  1627     History Chief Complaint  Patient presents with   Leg Swelling    Phillip Frye is a 48 y.o. male with a past medical history significant for A. fib, history of PE on chronic Eliquis, hypertension, diabetes mellitus, obesity, and sleep apnea on CPAP who presents to the ED due to gradual onset of worsening left knee pain and left leg swelling.  Patient had ACL surgery on 12/18 by Dr. Berenice Primas with orthopedics.  Patient notes on Monday he had 100 cc of blood drawn from his left knee.  Patient notes his left knee has began to swell again after the blood was drawn over the past few days. Patient admits to worsening left knee and shin pain over the past few days. He has taken Oxycodone with mild relief. Pain is worse with ambulation. Left leg pain is associated with mild numbness/tingling of his left leg. Patient denies chest pain and shortness of breath. Patient has been complaint with his Eliquis. Patient denies warmth and erythema of left knee.     Past Medical History:  Diagnosis Date   Arthritis    Atrial fibrillation ([place])    Diabetes mellitus without complication ([place])    Dysrhythmia    unknown type   GERD (gastroesophageal reflux disease)    Hypertension    MD stopped BP med 1 yr ago due to normal BP   Obesity    Pulmonary embolism, bilateral ([place]) 09/2012   on xarelto   Shortness of breath    with exertion   Sleep apnea    uses c pap     Patient Active Problem List   Diagnosis Date Noted   Left anterior cruciate ligament tear 01/29/2019   Acute medial meniscus tear of left knee 01/29/2019   Acute lateral meniscus tear of left knee 01/29/2019   Chondromalacia of left knee 01/29/2019   Type 2 diabetes mellitus with hyperglycemia, with long-term current use of insulin ([place]) 10/28/2018   Dyslipidemia 10/28/2018   Primary hypercoagulable  state ([place]) 01/18/2018   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   Abnormal EKG 12/23/2012   Obesity, Class III, BMI 40-49.9 (morbid obesity) ([place]) 12/11/2012   SOB (shortness of breath) 09/22/2012   Pulmonary embolism, bilateral ([place]) 09/22/2012   Diabetes mellitus without complication ([place])    DENTAL PAIN 03/31/2008   CARPAL TUNNEL SYNDROME, BILATERAL 10/09/2007   ULNAR NEUROPATHY 10/09/2007   DENTAL CARIES 10/09/2007   GERD 10/09/2007   INGUINAL PAIN, BILATERAL 02/26/2007   KNEE PAIN, RIGHT 12/19/2006   FOOT PAIN, BILATERAL 12/19/2006   HYPERTENSION 09/18/2006   AVASCULAR NECROSIS 09/18/2006   Sleep apnea 09/18/2006    Past Surgical History:  Procedure Laterality Date   BREATH TEK H PYLORI N/A 12/28/2012   Procedure: BREATH TEK H PYLORI;  Pusch: Gayland Curry, MD;  Location: [facility];  Service: General;  Laterality: N/A;   HIP SURGERY Bilateral    to "shave gthe bones"   KNEE ARTHROSCOPY WITH ANTERIOR CRUCIATE LIGAMENT (ACL) REPAIR Left 01/29/2019   Procedure: KNEE ARTHROSCOPY WITH CHONDROPLASTY AND  ANTERIOR CRUCIATE LIGAMENT ([place]) RPAIR WITH [place];  Charlet: Dorna Leitz, MD;  Location: [facility];  Service: Orthopedics;  Laterality: Left;   LAPAROSCOPIC GASTRIC SLEEVE RESECTION N/A 05/31/2013   Procedure: LAPAROSCOPIC GASTRIC SLEEVE RESECTION;  Denise: Gayland Curry, MD;  Location: [facility];  Service: General;  Laterality: N/A;  Family History  Problem Relation Age of Onset   Cirrhosis Father    Diabetes Other    Diabetes Other    Emphysema Paternal Grandfather        smoked    Social History   Tobacco Use   Smoking status: Current Every Day Smoker    Packs/day: 1.00    Years: 6.00    Pack years: 6.00    Types: Cigars   Smokeless tobacco: Never Used   Tobacco comment: 3 cigars a day  Substance Use Topics   Alcohol use: Yes    Comment: occasional   Drug use: No    Home Medications Prior  to Admission medications   Medication Sig Start Date End Date Taking? Authorizing Provider  atorvastatin (LIPITOR) 10 MG tablet Take 1 tablet (10 mg total) by mouth daily at 6 PM. 10/22/18   Wendie Agreste, MD  blood glucose meter kit and supplies Dispense based on patient and insurance preference. Use up to four times daily as directed. (FOR ICD-10 E10.9, E11.9). 01/15/18   Wendie Agreste, MD  Blood Glucose Monitoring Suppl (FREESTYLE LITE) DEVI  01/15/18   [provider]  CINNAMON PO Take 500 mg by mouth 2 (two) times daily.    [provider]  ELIQUIS 5 MG TABS tablet TAKE 1 TABLET BY MOUTH 2 TIMES DAILY FOR 30 DAYS 10/21/18   Wendie Agreste, MD  glucose blood test strip Up to 3 times per day - DM2 with hyperglycemia. Uncontrolled. 10/22/18   Wendie Agreste, MD  Insulin Pen Needle (PEN NEEDLES) 32G X 4 MM MISC 1 application by Does not apply route daily. 10/22/18   Wendie Agreste, MD  Lancets (FREESTYLE) lancets  01/15/18   [provider]  losartan (COZAAR) 50 MG tablet Take 50 mg by mouth daily.    [provider]  metoprolol succinate (TOPROL-XL) 50 MG 24 hr tablet Take 50 mg by mouth daily. Take with or immediately following a meal.    [provider]  oxyCODONE-acetaminophen (PERCOCET/ROXICET) 5-325 MG tablet Take 1-2 tablets by mouth every 6 (six) hours as needed for severe pain. 01/29/19   Gary Fleet, PA-C  Semaglutide,0.25 or 0.5MG/DOS, (OZEMPIC, 0.25 OR 0.5 MG/DOSE,) 2 MG/1.5ML SOPN Inject 0.5 mg into the skin once a week. 10/28/18   Shamleffer, Melanie Crazier, MD  amLODipine (NORVASC) 5 MG tablet Take 1 tablet (5 mg total) by mouth daily. 01/01/18 12/23/18  Wendie Agreste, MD  levocetirizine (XYZAL) 5 MG tablet Take 1 tablet (5 mg total) by mouth every evening. 08/23/17 01/26/19  Kennyth Arnold, FNP    Allergies    Patient has no known allergies.  Review of Systems   Review of Systems  Constitutional: Negative for chills  and fever.  Respiratory: Negative for shortness of breath.   Cardiovascular: Positive for leg swelling. Negative for chest pain.  Gastrointestinal: Negative for abdominal pain, diarrhea, nausea and vomiting.  Musculoskeletal: Positive for arthralgias (left knee), gait problem and joint swelling.  Skin: Negative for color change.  Neurological: Positive for numbness.    Physical Exam Updated Vital Signs BP 138/87 (BP Location: Right Arm)    Pulse 98    Temp 98.7 F (37.1 C) (Oral)    Resp 18    SpO2 98%   Physical Exam Vitals and nursing note reviewed.  Constitutional:      General: He is not in acute distress.    Appearance: He is not ill-appearing.  HENT:  Head: Normocephalic.  Eyes:     Conjunctiva/sclera: Conjunctivae normal.  Cardiovascular:     Rate and Rhythm: Normal rate and regular rhythm.     Pulses: Normal pulses.     Heart sounds: Normal heart sounds. No murmur. No friction rub. No gallop.   Pulmonary:     Effort: Pulmonary effort is normal.     Breath sounds: Normal breath sounds.  Abdominal:     General: Abdomen is flat. There is no distension.     Palpations: Abdomen is soft.     Tenderness: There is no abdominal tenderness. There is no guarding or rebound.  Musculoskeletal:        General: Tenderness present.     Cervical back: Normal range of motion and neck supple.     Right lower leg: No edema.     Left lower leg: Edema present.     Comments: Edema of left knee mostly in suprapatellar region. Normal ROM. No erythema or warmth. 2+ pitting edema over shin. Mild tenderness to palpation of left calf. Soft compartments. Able to ambulate with a crutch. Distal pulses and sensation intact. Well healing incision site below left knee.  Skin:    General: Skin is warm.  Neurological:     General: No focal deficit present.     Mental Status: He is alert.     ED Results / Procedures / Treatments   Labs (all labs ordered are listed, but only abnormal results  are displayed) Labs Reviewed - No data to display  EKG None  Radiology DG Knee Complete 4 Views Left  Result Date: 02/05/2019 CLINICAL DATA:  Twisting injury with fall at work today. Knee surgery 1 week ago. EXAM: LEFT KNEE - COMPLETE 4+ VIEW COMPARISON:  Radiographs 09/18/2018. FINDINGS: The mineralization and alignment are normal. There is no evidence of acute fracture or dislocation. Interval ACL reconstruction with hamstring allograft. The retention buttons do not appear displaced. There is a moderate size knee joint effusion. Medial compartment joint space narrowing is unchanged. IMPRESSION: No acute osseous findings status post ACL reconstruction. Moderate-sized knee joint effusion and mild medial compartment degenerative changes. Electronically Signed   By: Richardean Sale M.D.   On: 02/05/2019 17:48   VAS [place] LOWER EXTREMITY VENOUS (DVT) (ONLY [facility] & [facility])  Result Date: 02/05/2019  Lower Venous Study Indications: Pain, Swelling, and Recent TKR.  Limitations: Body habitus and edema. Comparison Study: Prior study from 09/22/12 is available for comparison Performing Technologist: Sharion Dove RVS  Examination Guidelines: A complete evaluation includes B-mode imaging, spectral Doppler, color Doppler, and power Doppler as needed of all accessible portions of each vessel. Bilateral testing is considered an integral part of a complete examination. Limited examinations for reoccurring indications may be performed as noted.  Right Technical Findings: Right leg not evaluated.  +---------+---------------+---------+-----------+----------+--------------+  LEFT      Compressibility Phasicity Spontaneity Properties Thrombus Aging  +---------+---------------+---------+-----------+----------+--------------+  CFV       Full            Yes       Yes                                    +---------+---------------+---------+-----------+----------+--------------+  SFJ       Full                                                              +---------+---------------+---------+-----------+----------+--------------+  FV Prox   Full                                                             +---------+---------------+---------+-----------+----------+--------------+  FV Mid    Full                                                             +---------+---------------+---------+-----------+----------+--------------+  FV Distal Full                                                             +---------+---------------+---------+-----------+----------+--------------+  PFV       Full                                                             +---------+---------------+---------+-----------+----------+--------------+  POP       Full            Yes       Yes                                    +---------+---------------+---------+-----------+----------+--------------+  PTV       Full                                                             +---------+---------------+---------+-----------+----------+--------------+  PERO      Full                                                             +---------+---------------+---------+-----------+----------+--------------+     Summary: Left: There is no evidence of deep vein thrombosis in the lower extremity.  *See table(s) above for measurements and observations.    Preliminary     Procedures Procedures (including critical care time)  Medications Ordered in ED Medications - No data to display  ED Course  I have reviewed the triage vital signs and the nursing notes.  Pertinent labs & imaging results that were available during my care of the patient were reviewed by me and considered in my medical decision making (see chart for details).    MDM Rules/Calculators/A&P                      48 year old male presents the ED due  to left knee pain and left leg swelling after ACL surgery on 12/18.  Patient saw Dr. Berenice Primas on Monday and had 100cc of blood drained from left knee. Patient has a  history of numerous blood clots and PE and is complaint with his Eliquis. Patient denies shortness of breath and chest pain. Vitals all within normal limits. Patient is not hypoxic or tachycardic. Patient in no acute distress and non-ill appearing. Left knee with mild edema in suprapatellar region with 2+ pitting edema of left lower extremity. Neurovascularly intact. Compartments soft, no concern for compartment syndrome at this time. Well healing incision sites. Left calf tenderness. No warmth or erythema, doubt septic arthritis at this time. X-ray personally reviewed which is significant for moderate sized effusion and medial degenerative compartment disease. [place] negative for signs of DVT. Suspect normal swelling after surgery. Instructed patient to call Dr. Jackalyn Lombard office on Monday to schedule an appointment for further evaluation. Patient advised to keep leg elevated and use ice as needed. Patient also advised to take over the counter ibuprofen or tylenol as needed for pain. Strict ED precautions discussed with patient. Patient states understanding and agrees to plan. Patient discharged home in no acute distress and stable vitals  Final Clinical Impression(s) / ED Diagnoses Final diagnoses:  Left leg swelling  Acute pain of left knee    Rx / DC Orders ED Discharge Orders    None       Karie Kirks 02/05/19 Lorie Phenix, MD 02/09/19 1733

## 2019-02-05 NOTE — Discharge Instructions (Addendum)
As discussed, your ultrasound was negative for DVT. Your x-ray showed a little bit of fluid collection. I am wrapping your leg to help with the fluid. Keep leg elevated. You may use over the counter ibuprofen or tylenol as needed for pain. You may also use ice. Call Dr. Jackalyn Lombard office on Monday to schedule an appointment. He may need to remove more fluid from your knee. Return to the ER for new or worsening symptoms.

## 2019-02-05 NOTE — ED Notes (Signed)
Pt verbalizes understanding of DC instructions. Pt belongings returned and is ambulatory out of ED.  

## 2019-02-05 NOTE — Telephone Encounter (Signed)
Forwarding medication refill request to the clinical pool for review. 

## 2019-02-08 ENCOUNTER — Ambulatory Visit: Payer: Self-pay | Admitting: Family Medicine

## 2019-02-10 ENCOUNTER — Encounter: Payer: Self-pay | Admitting: Internal Medicine

## 2019-02-10 ENCOUNTER — Other Ambulatory Visit: Payer: Self-pay

## 2019-02-10 ENCOUNTER — Ambulatory Visit (INDEPENDENT_AMBULATORY_CARE_PROVIDER_SITE_OTHER): Payer: BC Managed Care – PPO | Admitting: Internal Medicine

## 2019-02-10 VITALS — BP 136/68 | HR 97 | Temp 98.7°F | Ht 74.0 in | Wt 356.8 lb

## 2019-02-10 DIAGNOSIS — E119 Type 2 diabetes mellitus without complications: Secondary | ICD-10-CM | POA: Diagnosis not present

## 2019-02-10 LAB — GLUCOSE, POCT (MANUAL RESULT ENTRY): POC Glucose: 233 mg/dl — AB (ref 70–99)

## 2019-02-10 MED ORDER — METFORMIN HCL 1000 MG PO TABS: 1000.0000 mg | ORAL_TABLET | Freq: Two times a day (BID) | ORAL | 3 refills | Status: DC

## 2019-02-10 NOTE — Patient Instructions (Signed)
-   Ozempic 0.5 mg weekly - Metformin 1000  Twice a day with meals      HOW TO TREAT LOW BLOOD SUGARS (Blood sugar LESS THAN 70 MG/DL)  Please follow the RULE OF 15 for the treatment of hypoglycemia treatment (when your (blood sugars are less than 70 mg/dL)    STEP 1: Take 15 grams of carbohydrates when your blood sugar is low, which includes:   3-4 GLUCOSE TABS  OR  3-4 OZ OF JUICE OR REGULAR SODA OR  ONE TUBE OF GLUCOSE GEL     STEP 2: RECHECK blood sugar in 15 MINUTES STEP 3: If your blood sugar is still low at the 15 minute recheck --> then, go back to STEP 1 and treat AGAIN with another 15 grams of carbohydrates.  

## 2019-02-10 NOTE — Progress Notes (Signed)
Name: Phillip Frye  Age/ Sex: 48 y.o., male   MRN/ DOB: [id number], Oct 08, 1970     PCP: Wendie Agreste, MD   Reason for Endocrinology Evaluation: Type 2 Diabetes Mellitus  Initial Endocrine Consultative Visit: 10/28/2018    PATIENT IDENTIFIER: [name] is a 48 y.o. male with a past medical history of HTN, T2DM, OSA and PE. The patient has followed with Endocrinology clinic since 10/28/2018 for consultative assistance with management of his diabetes.  DIABETIC HISTORY:  Phillip Frye was diagnosed with DM in 2019. Has been on victoza and Januvia .Hemoglobin A1c has ranged from  10.5% in 2020, peaking at 12.8% in 2019.   ON his initial visit to our clinic his A1c was 10.5%. He was on Basaglar and Metformin. Ozempic was added. Basaglar was discontinued 11/2018 due to hypoglycemia.   Works a 3rd shift 8 PM to [employer]   Eats 3 pm, 10 pm and 1 AM   SUBJECTIVE:   During the last visit (10/28/2018):  A1c was 10.5%. He was on Basaglar and Metformin, Ozempic was added.    Today (02/10/2019): Phillip Frye is here for a 3 month follow up on diabetes. He checks his blood sugars 3 times daily. The patient did not being his meter today . He has not had hypoglycemic episodes since being off basaglar but he also has not been taking metformin. Otherwise, the patient has not required any recent emergency interventions for hypoglycemia but had  Recent ED visits for LE edema, negative for DVT.  Left knee sx    ROS: As per HPI and as detailed below: Review of Systems  Constitutional: Negative for chills and fever.  Respiratory: Negative for cough and shortness of breath.   Cardiovascular: Positive for leg swelling. Negative for chest pain.  Gastrointestinal: Negative for diarrhea and nausea.      HOME DIABETES REGIMEN:  Ozempic 0.5 mg weekly  Metformin 1000 mg BID - not taking     METER DOWNLOAD SUMMARY: Fasting ~ 140 mg/dL   Evening 160's mg/sL      HISTORY:  Past Medical History:  Past Medical History:  Diagnosis Date  . Arthritis   . Atrial fibrillation ([place])   . Diabetes mellitus without complication ([place])   . Dysrhythmia    unknown type  . GERD (gastroesophageal reflux disease)   . Hypertension    MD stopped BP med 1 yr ago due to normal BP  . Obesity   . Pulmonary embolism, bilateral ([place]) 09/2012   on xarelto  . Shortness of breath    with exertion  . Sleep apnea    uses c pap    Past Surgical History:  Past Surgical History:  Procedure Laterality Date  . BREATH TEK H PYLORI N/A 12/28/2012   Procedure: BREATH TEK H PYLORI;  [place]: Gayland Curry, MD;  Location: [facility];  Service: General;  Laterality: N/A;  . HIP SURGERY Bilateral    to "shave gthe bones"  . KNEE ARTHROSCOPY WITH ANTERIOR CRUCIATE LIGAMENT (ACL) REPAIR Left 01/29/2019   Procedure: KNEE ARTHROSCOPY WITH CHONDROPLASTY AND  ANTERIOR CRUCIATE LIGAMENT (ACL) RPAIR WITH [place];  [place]: Dorna Leitz, MD;  Location: [facility];  Service: Orthopedics;  Laterality: Left;  . LAPAROSCOPIC GASTRIC SLEEVE RESECTION N/A 05/31/2013   Procedure: LAPAROSCOPIC GASTRIC SLEEVE RESECTION;  [place]: Gayland Curry, MD;  Location: [facility];  Service: General;  Laterality: N/A;    Social History:  reports that he has been smoking cigars. He has a 6.00 pack-year smoking history. He has never used smokeless tobacco. He reports current alcohol use. He reports that he does not use drugs. Family History:  Family History  Problem Relation Age of Onset  . Cirrhosis Father   . Diabetes Other   . Diabetes Other   . Emphysema Paternal Grandfather        smoked     HOME MEDICATIONS: Allergies as of 02/10/2019   No Known Allergies     Medication List       Accurate as of February 10, 2019  8:57 AM. If you have any questions, ask your nurse or doctor.        atorvastatin 10 MG tablet Commonly known as: LIPITOR Take 1 tablet (10 mg total)  by mouth daily at 6 PM.   blood glucose meter kit and supplies Dispense based on patient and insurance preference. Use up to four times daily as directed. (FOR ICD-10 E10.9, E11.9).   CINNAMON PO Take 500 mg by mouth 2 (two) times daily.   Eliquis 5 MG Tabs tablet Generic drug: apixaban TAKE 1 TABLET BY MOUTH 2 TIMES DAILY FOR 30 DAYS   freestyle lancets   FreeStyle Lite Devi   glucose blood test strip Up to 3 times per day - DM2 with hyperglycemia. Uncontrolled.   losartan 50 MG tablet Commonly known as: COZAAR Take 100 mg by mouth daily.   metFORMIN 1000 MG tablet Commonly known as: GLUCOPHAGE Take 1 tablet (1,000 mg total) by mouth 2 (two) times daily with a meal. Started by: Dorita Sciara, MD   metoprolol succinate 50 MG 24 hr tablet Commonly known as: TOPROL-XL Take 50 mg by mouth daily. Take with or immediately following a meal.   oxyCODONE-acetaminophen 5-325 MG tablet Commonly known as: PERCOCET/ROXICET Take 1-2 tablets by mouth every 6 (six) hours as needed for severe pain.   Ozempic (0.25 or 0.5 MG/DOSE) 2 MG/1.5ML Sopn Generic drug: Semaglutide(0.25 or 0.'5MG'$ /DOS) Inject 0.5 mg into the skin once a week.   Pen Needles 32G X 4 MM Misc 1 application by Does not apply route daily.        OBJECTIVE:   Vital Signs: BP 136/68 (BP Location: Left Arm, Patient Position: Sitting, Cuff Size: Large)   Pulse 97   Temp 98.7 F (37.1 C)   Ht '6\' 2"'$  (1.88 m)   Wt (!) 356 lb 12.8 oz (161.8 kg)   SpO2 97%   BMI 45.81 kg/m   Wt Readings from Last 3 Encounters:  02/10/19 (!) 356 lb 12.8 oz (161.8 kg)  01/29/19 (!) 355 lb 2.6 oz (161.1 kg)  12/23/18 (!) 357 lb (161.9 kg)     Exam: General: Pt appears well and is in NAD  Lungs: Clear with good BS bilat with no rales, rhonchi, or wheezes  Heart: RRR with normal S1 and S2 and no gallops; no murmurs; no rub  Abdomen: Normoactive bowel sounds, soft, nontender, without masses or organomegaly palpable   Extremities: Left  pretibial edema.normal right LE  Neuro: MS is good with appropriate affect, pt is alert and Ox3   DM foot exam: 10/28/18  The skin of the feet is intact without sores or ulcerations. The pedal pulses are 2+ on right and 2+ on left. The sensation is intact to a screening 5.07, 10 gram monofilament bilaterally     DATA REVIEWED:  Lab Results  Component Value Date   HGBA1C 6.9 (A) 12/23/2018  HGBA1C 10.5 (A) 10/22/2018   HGBA1C 10.4 (H) 10/22/2018   Lab Results  Component Value Date   LDLCALC 30 10/22/2018   CREATININE 0.91 01/27/2019   Lab Results  Component Value Date   MICRALBCREAT <9.3 12/04/2017     Lab Results  Component Value Date   CHOL 113 10/22/2018   HDL 23 (L) 10/22/2018   LDLCALC 30 10/22/2018   TRIG 422 (H) 10/22/2018   CHOLHDL 4.9 10/22/2018         ASSESSMENT / PLAN / RECOMMENDATIONS:   1) Type 2 Diabetes Mellitus, Optimally controlled, Without complications - Most recent A1c of 6.9 %. Goal A1c < 7.0 %.    - Basaglar was stopped due to hypoglycemic episodes. Pt described BG's in the 80's and 90'd as "low" His niece who has a type 1 Dm has scared him of hypoglycemia and had advised him not to go to bed without BG's being at 200. A lot of education done about the difference between type 1 Dm ( which apparently his niece has) and type 2 ( which he has) we also discussed people with T1DM are more sensitive to insulin then T2. I also discouraged him from going to bed which such high readings, we also discussed the risk of hyperglycemia and end organ damage. We also discussed the true definition of hypoglycemia ( which is BG< 70 mg/dL)   - His in-office BG 233 mg/dL (post-prandial)  - I have encouraged him to restart Metformin, if he is not comfortable with BG's below 100 mg/dL he could take half the dose of metformin    MEDICATIONS:  Continue Ozempic 0.5 mg weekly  Restart Metformin 1000 mg BID  EDUCATION / INSTRUCTIONS:  BG  monitoring instructions: Patient is instructed to check his blood sugars 2 times a day, fasting and bedtime.  Call [facility] if: BG persistently < 70 or > 300. . I reviewed the Rule of 15 for the treatment of hypoglycemia in detail with the patient. Literature supplied.    F/U in 3 months    Signed electronically by: Mack Guise, MD  [facility]  [facility] [address] [place], [place] [address] Phone: [phone] FAX: [phone]   CC: Wendie Agreste, MD [address] [place] [address] Phone: [phone]  Fax: [phone]  Return to Endocrinology clinic as below: Future Appointments  Date Time Provider Reform  04/22/2019  8:40 AM Wendie Agreste, MD [facility]CP[facility]  04/27/2019  9:30 AM Ward Givens, NP [facility] None  05/17/2019  9:30 AM Alaiya Martindelcampo, Melanie Crazier, MD [facility] None

## 2019-02-18 DIAGNOSIS — N5201 Erectile dysfunction due to arterial insufficiency: Secondary | ICD-10-CM | POA: Diagnosis not present

## 2019-02-18 DIAGNOSIS — Z125 Encounter for screening for malignant neoplasm of prostate: Secondary | ICD-10-CM | POA: Diagnosis not present

## 2019-03-01 DIAGNOSIS — I480 Paroxysmal atrial fibrillation: Secondary | ICD-10-CM | POA: Diagnosis not present

## 2019-03-01 DIAGNOSIS — I10 Essential (primary) hypertension: Secondary | ICD-10-CM | POA: Diagnosis not present

## 2019-03-01 DIAGNOSIS — E119 Type 2 diabetes mellitus without complications: Secondary | ICD-10-CM | POA: Diagnosis not present

## 2019-03-01 DIAGNOSIS — R0789 Other chest pain: Secondary | ICD-10-CM | POA: Diagnosis not present

## 2019-03-17 ENCOUNTER — Other Ambulatory Visit: Payer: Self-pay

## 2019-03-17 ENCOUNTER — Encounter (HOSPITAL_COMMUNITY): Payer: Self-pay | Admitting: Emergency Medicine

## 2019-03-17 ENCOUNTER — Emergency Department (HOSPITAL_COMMUNITY)
Admission: EM | Admit: 2019-03-17 | Discharge: 2019-03-17 | Disposition: A | Discharge status: Home / Self Care | Payer: BC Managed Care – PPO | Attending: Emergency Medicine | Admitting: Emergency Medicine

## 2019-03-17 ENCOUNTER — Emergency Department (HOSPITAL_COMMUNITY): Payer: BC Managed Care – PPO

## 2019-03-17 DIAGNOSIS — Z7984 Long term (current) use of oral hypoglycemic drugs: Secondary | ICD-10-CM | POA: Diagnosis not present

## 2019-03-17 DIAGNOSIS — Y92009 Unspecified place in unspecified non-institutional (private) residence as the place of occurrence of the external cause: Secondary | ICD-10-CM | POA: Insufficient documentation

## 2019-03-17 DIAGNOSIS — I10 Essential (primary) hypertension: Secondary | ICD-10-CM | POA: Insufficient documentation

## 2019-03-17 DIAGNOSIS — I480 Paroxysmal atrial fibrillation: Secondary | ICD-10-CM | POA: Diagnosis not present

## 2019-03-17 DIAGNOSIS — R002 Palpitations: Secondary | ICD-10-CM | POA: Diagnosis not present

## 2019-03-17 DIAGNOSIS — R079 Chest pain, unspecified: Secondary | ICD-10-CM | POA: Diagnosis not present

## 2019-03-17 DIAGNOSIS — Y999 Unspecified external cause status: Secondary | ICD-10-CM | POA: Insufficient documentation

## 2019-03-17 DIAGNOSIS — E119 Type 2 diabetes mellitus without complications: Secondary | ICD-10-CM | POA: Diagnosis not present

## 2019-03-17 DIAGNOSIS — F17290 Nicotine dependence, other tobacco product, uncomplicated: Secondary | ICD-10-CM | POA: Diagnosis not present

## 2019-03-17 DIAGNOSIS — F419 Anxiety disorder, unspecified: Secondary | ICD-10-CM | POA: Insufficient documentation

## 2019-03-17 DIAGNOSIS — S46811A Strain of other muscles, fascia and tendons at shoulder and upper arm level, right arm, initial encounter: Secondary | ICD-10-CM | POA: Diagnosis not present

## 2019-03-17 DIAGNOSIS — M542 Cervicalgia: Secondary | ICD-10-CM | POA: Insufficient documentation

## 2019-03-17 DIAGNOSIS — Y939 Activity, unspecified: Secondary | ICD-10-CM | POA: Insufficient documentation

## 2019-03-17 DIAGNOSIS — R0789 Other chest pain: Secondary | ICD-10-CM | POA: Insufficient documentation

## 2019-03-17 DIAGNOSIS — X58XXXA Exposure to other specified factors, initial encounter: Secondary | ICD-10-CM | POA: Diagnosis not present

## 2019-03-17 DIAGNOSIS — Z79899 Other long term (current) drug therapy: Secondary | ICD-10-CM | POA: Diagnosis not present

## 2019-03-17 DIAGNOSIS — S4991XA Unspecified injury of right shoulder and upper arm, initial encounter: Secondary | ICD-10-CM | POA: Diagnosis not present

## 2019-03-17 DIAGNOSIS — Z7901 Long term (current) use of anticoagulants: Secondary | ICD-10-CM | POA: Diagnosis not present

## 2019-03-17 LAB — BASIC METABOLIC PANEL
Anion gap: 10 (ref 5–15)
BUN: 10 mg/dL (ref 6–20)
CO2: 26 mmol/L (ref 22–32)
Calcium: 9.1 mg/dL (ref 8.9–10.3)
Chloride: 104 mmol/L (ref 98–111)
Creatinine, Ser: 1.05 mg/dL (ref 0.61–1.24)
GFR calc Af Amer: >60 mL/min (ref >60)
GFR calc non Af Amer: >60 mL/min (ref >60)
Glucose, Bld: 111 mg/dL — ABNORMAL HIGH (ref 70–99)
Potassium: 3.9 mmol/L (ref 3.5–5.1)
Sodium: 140 mmol/L (ref 135–145)

## 2019-03-17 LAB — CBC
HCT: 47.1 % (ref 39.0–52.0)
Hemoglobin: 15.2 g/dL (ref 13.0–17.0)
MCH: 29.9 pg (ref 26.0–34.0)
MCHC: 32.3 g/dL (ref 30.0–36.0)
MCV: 92.7 fL (ref 80.0–100.0)
Platelets: 300 10*3/uL (ref 150–400)
RBC: 5.08 MIL/uL (ref 4.22–5.81)
RDW: 12.1 % (ref 11.5–15.5)
WBC: 7.2 10*3/uL (ref 4.0–10.5)
nRBC: 0 % (ref 0.0–0.2)

## 2019-03-17 LAB — PROTIME-INR
INR: 1 (ref 0.8–1.2)
Prothrombin Time: 12.6 seconds (ref 11.4–15.2)

## 2019-03-17 LAB — TROPONIN I (HIGH SENSITIVITY)
Troponin I (High Sensitivity): 15 ng/L (ref <18)
Troponin I (High Sensitivity): 10 ng/L (ref <18)

## 2019-03-17 MED ORDER — SODIUM CHLORIDE 0.9% FLUSH: 3.0000 mL | Freq: Once | INTRAVENOUS | Status: DC

## 2019-03-17 MED ORDER — LIDOCAINE 5 % EX PTCH: 1.0000 | MEDICATED_PATCH | CUTANEOUS | 0 refills | Status: DC

## 2019-03-17 NOTE — ED Notes (Addendum)
Pt ambulatory to [place] [facility] from [facility]. Pt is A&Ox4. Placed on continuous monitors. VSS. Breathing easy, non-labored. Denies SOB/N/V. Pt endorses R sided chest and neck pressure that began around 4 AM. Pt reports hx of afib and was seen at his private MD for fast heart rate three weeks ago. Pt reports his metoprolol was increased. Denies feeling rapid heart rate presently. PA at bedside. PIV initiated, 18G to LAC. IV flushes with 10 cc NS without s/s of infiltration. Secured with tape and tegaderm

## 2019-03-17 NOTE — ED Notes (Signed)
Care endorsed to Kimberly, RN 

## 2019-03-17 NOTE — Discharge Instructions (Signed)
Follow up with your cardiologist for palpitations. Return to the emergency department for any new or worsening symptoms.

## 2019-03-17 NOTE — ED Triage Notes (Signed)
Patient reports intermittent right chest pain for several weeks , denies SOB , no emesis or diaphoresis , denies cough or fever .

## 2019-03-17 NOTE — ED Provider Notes (Signed)
Assumed from [place], PA-C at shift change. See note for full HPI.   "49 year old male with history of arthritis, A. fib on Eliquis, GERD, hypertension, sleep apnea, presenting to the ED with chest pain.  States he always has "an irregular heartbeat", like his heart is beating really hard but that has been more prominent lately.  States this morning around 3 AM he started having some pain on the right side of his chest and right neck.  States pain is worse when trying to move or if he coughs.  This is the same pain he has felt previously, always occurring on the right side and never the left.  He did see his doctorprevisouly for same,  no acute findings noted.  He denies any shortness of breath, diaphoresis, nausea, or vomiting.  He denies any known history of coronary artery disease.  No prior cardiac cath.  He smokes cigars but no cigarettes.  No headaches, dizziness, confusion, numbness, or weakness."   Chronic palpitations, Pain with movement to right arm.  Low sus for cardiac. Heart score 4- delta trop, plan to dc home. Anxious. No CAD. Seen for similar symptoms with Neg workup  Trop 15 baseline  Plan to follow up with Cards outpatient. Physical Exam  BP (!) 121/91   Pulse 83   Temp 98.2 F (36.8 C) (Oral)   Resp 20   SpO2 99%   Physical Exam Vitals and nursing note reviewed.  Constitutional:      General: He is not in acute distress.    Appearance: He is well-developed. He is not ill-appearing, toxic-appearing or diaphoretic.  HENT:     Head: Atraumatic.  Eyes:     Pupils: Pupils are equal, round, and reactive to light.  Neck:   Cardiovascular:     Rate and Rhythm: Normal rate and regular rhythm.     Pulses: Normal pulses.          Radial pulses are 2+ on the right side and 2+ on the left side.     Heart sounds: Normal heart sounds.  Pulmonary:     Effort: Pulmonary effort is normal. No respiratory distress.     Breath sounds: Normal breath sounds and air entry.   Comments: Clear to auscultation bilaterally.  Speaks in full sentences without difficulty. Chest:     Comments: No chest wall tenderness palpation, crepitus or step-offs Abdominal:     General: Bowel sounds are normal. There is no distension.     Palpations: Abdomen is soft.     Comments: Soft, Nontender  Musculoskeletal:        General: Normal range of motion.     Cervical back: Normal range of motion and neck supple.     Right lower leg: No tenderness. No edema.     Left lower leg: No tenderness. No edema.     Comments: All 4 extremities without difficulty.  Pain with overhead movement to right shoulder.  Negative Hawkins, empty can. Homans sign negative  Skin:    General: Skin is warm and dry.     Capillary Refill: Capillary refill takes less than 2 seconds.     Comments: Brisk capillary refill.  No rashes or lesions.  Neurological:     General: No focal deficit present.     Mental Status: He is alert.     Cranial Nerves: Cranial nerves are intact.     Sensory: Sensation is intact.     Motor: Motor function is intact.  Coordination: Coordination is intact.     Gait: Gait is intact.     Comments: CN 2-12 grossly intact.  Ambulatory without difficulty.    ED Course/Procedures     DG Chest 2 View  Result Date: 03/17/2019 CLINICAL DATA:  Chest pain EXAM: CHEST - 2 VIEW COMPARISON:  08/11/2018 FINDINGS: Normal heart size and mediastinal contours. Chronic borderline airway thickening. There is no edema, consolidation, effusion, or pneumothorax. IMPRESSION: No evidence of acute disease. Electronically Signed   By: Monte Fantasia M.D.   On: 03/17/2019 04:46   Procedures Labs Reviewed  BASIC METABOLIC PANEL - Abnormal; Notable for the following components:      Result Value   Glucose, Bld 111 (*)    All other components within normal limits  CBC  PROTIME-INR  TROPONIN I (HIGH SENSITIVITY)  TROPONIN I (HIGH SENSITIVITY)   MDM   Care assumed from [place], PA-C at shift  change.  See note previous provider.  Plan to follow up on delta troponin.  Patient likely to DC home and follow-up with cardiology outpatient.  Previous provider symptoms did not seem related to ACS and seemed pretty atypical.  He is chronically anticoagulated and symptoms did not seem consistent with dissection or PE as well.  He is consistent with his anticoagulation.  History of A. fib however no A. fib here in ED, rate controlled.  Labs and imaging personally reviewed and interpreted. No acute findings in labs. Delta trop negative.  On reevaluation patient denies CP however admits to right trapezius pain.  He has no pain to his right lateral neck to suggest dissection.  Has equal pulses bilaterally.  Pain worse with movement.  Question musculoskeletal strain.  Will treat with lidocaine patches.  Discussed follow-up with cardiology for his palpitations that he has had for an extended period of time.  No arrhythmias here in the emergency department.  EKG reassuring.   Patient is to be discharged with recommendation to follow up with PCP in regards to today's hospital visit. Chest pain is not likely of cardiac or pulmonary etiology d/t presentation, PERC negative, VSS, no tracheal deviation, no JVD or new murmur, RRR, breath sounds equal bilaterally, EKG without acute abnormalities, negative troponin, and negative CXR. Pt has been advised to return to the ED if CP becomes exertional, associated with diaphoresis or nausea, radiates to left jaw/arm, worsens or becomes concerning in any way. Pt appears reliable for follow up and is agreeable to discharge.    The patient has been appropriately medically screened and/or stabilized in the ED. I have low suspicion for any other emergent medical condition which would require further screening, evaluation or treatment in the ED or require inpatient management.  Patient is hemodynamically stable and in no acute distress.  Patient able to ambulate in department  prior to ED.  Evaluation does not show acute pathology that would require ongoing or additional emergent interventions while in the emergency department or further inpatient treatment.  I have discussed the diagnosis with the patient and answered all questions.  Pain is been managed while in the emergency department and patient has no further complaints prior to discharge.  Patient is comfortable with plan discussed in room and is stable for discharge at this time.  I have discussed strict return precautions for returning to the emergency department.  Patient was encouraged to follow-up with PCP/specialist refer to at discharge.        Jenalee Trevizo A, PA-C 03/17/19 [place],  DO 03/17/19 0800

## 2019-03-17 NOTE — ED Provider Notes (Signed)
[facility] EMERGENCY DEPARTMENT Provider Note   CSN: [id number] Arrival date & time: 03/17/19  0345     History Chief Complaint  Patient presents with  . Chest Pain    Phillip Frye is a 49 y.o. male.  The history is provided by the patient and medical records.  Chest Pain   49 year old male with history of arthritis, A. fib on Eliquis, GERD, hypertension, sleep apnea, presenting to the ED with chest pain.  States he always has "an irregular heartbeat", like his heart is beating really hard but that has been more prominent lately.  States this morning around 3 AM he started having some pain on the right side of his chest and right neck.  States pain is worse when trying to move or if he coughs.  This is the same pain he has felt previously, always occurring on the right side and never the left.  He did see his doctorprevisouly for same,  no acute findings noted.  He denies any shortness of breath, diaphoresis, nausea, or vomiting.  He denies any known history of coronary artery disease.  No prior cardiac cath.  He smokes cigars but no cigarettes.  No headaches, dizziness, confusion, numbness, or weakness.  Past Medical History:  Diagnosis Date  . Arthritis   . Atrial fibrillation ([place])   . Diabetes mellitus without complication ([place])   . Dysrhythmia    unknown type  . GERD (gastroesophageal reflux disease)   . Hypertension    MD stopped BP med 1 yr ago due to normal BP  . Obesity   . Pulmonary embolism, bilateral ([place]) 09/2012   on xarelto  . Shortness of breath    with exertion  . Sleep apnea    uses c pap     Patient Active Problem List   Diagnosis Date Noted  . Type 2 diabetes mellitus without complication, without long-term current use of insulin ([place]) 02/10/2019  . Left anterior cruciate ligament tear 01/29/2019  . Acute medial meniscus tear of left knee 01/29/2019  . Acute lateral meniscus tear of left knee 01/29/2019  . Chondromalacia of left  knee 01/29/2019  . Type 2 diabetes mellitus with hyperglycemia, with long-term current use of insulin ([place]) 10/28/2018  . Dyslipidemia 10/28/2018  . Primary hypercoagulable state ([place]) 01/18/2018  . 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 Abnormal EKG 12/23/2012  . Obesity, Class III, BMI 40-49.9 (morbid obesity) ([place]) 12/11/2012  . SOB (shortness of breath) 09/22/2012  . Pulmonary embolism, bilateral ([place]) 09/22/2012  . Diabetes mellitus without complication ([place])   . DENTAL PAIN 03/31/2008  . CARPAL TUNNEL SYNDROME, BILATERAL 10/09/2007  . ULNAR NEUROPATHY 10/09/2007  . DENTAL CARIES 10/09/2007  . GERD 10/09/2007  . INGUINAL PAIN, BILATERAL 02/26/2007  . KNEE PAIN, RIGHT 12/19/2006  . FOOT PAIN, BILATERAL 12/19/2006  . HYPERTENSION 09/18/2006  . AVASCULAR NECROSIS 09/18/2006  . Sleep apnea 09/18/2006    Past Surgical History:  Procedure Laterality Date  . BREATH TEK H PYLORI N/A 12/28/2012   Procedure: BREATH TEK H PYLORI;  [place]: Gayland Curry, MD;  Location: [facility];  Service: General;  Laterality: N/A;  . HIP SURGERY Bilateral    to "shave gthe bones"  . KNEE ARTHROSCOPY WITH ANTERIOR CRUCIATE LIGAMENT (ACL) REPAIR Left 01/29/2019   Procedure: KNEE ARTHROSCOPY WITH CHONDROPLASTY AND  ANTERIOR CRUCIATE LIGAMENT (ACL) RPAIR WITH [place];  [place]: Dorna Leitz, MD;  Location: [facility];  Service: Orthopedics;  Laterality: Left;  .  LAPAROSCOPIC GASTRIC SLEEVE RESECTION N/A 05/31/2013   Procedure: LAPAROSCOPIC GASTRIC SLEEVE RESECTION;  [place]: Gayland Curry, MD;  Location: [facility];  Service: General;  Laterality: N/A;       Family History  Problem Relation Age of Onset  . Cirrhosis Father   . Diabetes Other   . Diabetes Other   . Emphysema Paternal Grandfather        smoked    Social History   Tobacco Use  . Smoking status: Current Every Day Smoker    Packs/day: 1.00    Years: 6.00    Pack years: 6.00    Types: Cigars  .  Smokeless tobacco: Never Used  . Tobacco comment: 3 cigars a day  Substance Use Topics  . Alcohol use: Yes    Comment: occasional  . Drug use: No    Home Medications Prior to Admission medications   Medication Sig Start Date End Date Taking? Authorizing Provider  atorvastatin (LIPITOR) 10 MG tablet Take 1 tablet (10 mg total) by mouth daily at 6 PM. 10/22/18   Wendie Agreste, MD  blood glucose meter kit and supplies Dispense based on patient and insurance preference. Use up to four times daily as directed. (FOR ICD-10 E10.9, E11.9). 01/15/18   Wendie Agreste, MD  Blood Glucose Monitoring Suppl (FREESTYLE LITE) DEVI  01/15/18   [provider]  CINNAMON PO Take 500 mg by mouth 2 (two) times daily.    [provider]  ELIQUIS 5 MG TABS tablet TAKE 1 TABLET BY MOUTH 2 TIMES DAILY FOR 30 DAYS 10/21/18   Wendie Agreste, MD  glucose blood test strip Up to 3 times per day - DM2 with hyperglycemia. Uncontrolled. 10/22/18   Wendie Agreste, MD  Insulin Pen Needle (PEN NEEDLES) 32G X 4 MM MISC 1 application by Does not apply route daily. 10/22/18   Wendie Agreste, MD  Lancets (FREESTYLE) lancets  01/15/18   [provider]  losartan (COZAAR) 50 MG tablet Take 100 mg by mouth daily.     [provider]  metFORMIN (GLUCOPHAGE) 1000 MG tablet Take 1 tablet (1,000 mg total) by mouth 2 (two) times daily with a meal. 02/10/19   Shamleffer, Melanie Crazier, MD  metoprolol succinate (TOPROL-XL) 50 MG 24 hr tablet Take 50 mg by mouth daily. Take with or immediately following a meal.    [provider]  oxyCODONE-acetaminophen (PERCOCET/ROXICET) 5-325 MG tablet Take 1-2 tablets by mouth every 6 (six) hours as needed for severe pain. 01/29/19   Gary Fleet, PA-C  Semaglutide,0.25 or 0.'5MG'$ /DOS, (OZEMPIC, 0.25 OR 0.5 MG/DOSE,) 2 MG/1.5ML SOPN Inject 0.5 mg into the skin once a week. 10/28/18   Shamleffer, Melanie Crazier, MD  amLODipine (NORVASC) 5 MG tablet Take  1 tablet (5 mg total) by mouth daily. 01/01/18 12/23/18  Wendie Agreste, MD  levocetirizine (XYZAL) 5 MG tablet Take 1 tablet (5 mg total) by mouth every evening. 08/23/17 01/26/19  Kennyth Arnold, FNP    Allergies    Patient has no known allergies.  Review of Systems   Review of Systems  Cardiovascular: Positive for chest pain.  All other systems reviewed and are negative.   Physical Exam Updated Vital Signs BP (!) 140/94   Pulse 80   Temp 98.2 F (36.8 C) (Oral)   Resp 14   SpO2 98%   Physical Exam Vitals and nursing note reviewed.  Constitutional:      Appearance: He is well-developed.  HENT:  Head: Normocephalic and atraumatic.  Eyes:     Conjunctiva/sclera: Conjunctivae normal.     Pupils: Pupils are equal, round, and reactive to light.  Cardiovascular:     Rate and Rhythm: Normal rate and regular rhythm.     Heart sounds: Normal heart sounds.  Pulmonary:     Effort: Pulmonary effort is normal.     Breath sounds: Normal breath sounds.  Chest:     Comments: Chest wall non-tender, pain elicited when turning neck or changing position on stretcher Abdominal:     General: Bowel sounds are normal.     Palpations: Abdomen is soft.  Musculoskeletal:        General: Normal range of motion.     Cervical back: Normal range of motion. No rigidity.     Comments:    Skin:    General: Skin is warm and dry.  Neurological:     Mental Status: He is alert and oriented to person, place, and time.  Psychiatric:     Comments: Seems anxious     ED Results / Procedures / Treatments   Labs (all labs ordered are listed, but only abnormal results are displayed) Labs Reviewed  BASIC METABOLIC PANEL - Abnormal; Notable for the following components:      Result Value   Glucose, Bld 111 (*)    All other components within normal limits  CBC  PROTIME-INR  TROPONIN I (HIGH SENSITIVITY)  TROPONIN I (HIGH SENSITIVITY)    EKG None  Radiology DG Chest 2 View  Result  Date: 03/17/2019 CLINICAL DATA:  Chest pain EXAM: CHEST - 2 VIEW COMPARISON:  08/11/2018 FINDINGS: Normal heart size and mediastinal contours. Chronic borderline airway thickening. There is no edema, consolidation, effusion, or pneumothorax. IMPRESSION: No evidence of acute disease. Electronically Signed   By: Monte Fantasia M.D.   On: 03/17/2019 04:46    Procedures Procedures (including critical care time)  Medications Ordered in ED Medications  sodium chloride flush (NS) 0.9 % injection 3 mL (3 mLs Intravenous Not Given 03/17/19 0531)    ED Course  I have reviewed the triage vital signs and the nursing notes.  Pertinent labs & imaging results that were available during my care of the patient were reviewed by me and considered in my medical decision making (see chart for details).    MDM Rules/Calculators/A&P  49 year old male here with chest pain.  Reports he has somewhat chronic heart palpitations which he describes as "my heart beating too hard".  Chest pain began this morning around 3am on his right side up into the neck, worse with movement and coughing.  States he has felt the same pain in the past and saw his PCP, no acute findings.  EKG with chronic ST-T wave changes inferiorly and laterally, unchanged from previous.  Initial labs are reassuring, troponin XV which is around his baseline when compared with prior values.  Chest x-ray is clear.  Symptoms seem somewhat atypical, no pleuritic component and he is chronically anticoagulated with Eliquis.  Given reproducible nature with movement, may be MSK.  Moderate risk heart score of 4 so will obtain delta troponin.  Care will be signed out to oncoming provider to follow-up on delta troponin and repeat assessment.  If negative, feel it would be reasonable to discharge home and follow-up with cardiology.  Final Clinical Impression(s) / ED Diagnoses Final diagnoses:  Chest pain in adult    Rx / DC Orders ED Discharge Orders    None  Larene Pickett, PA-C 03/17/19 7125    Orpah Greek, MD 03/17/19 [phone]

## 2019-04-16 ENCOUNTER — Other Ambulatory Visit: Payer: Self-pay | Admitting: Family Medicine

## 2019-04-16 DIAGNOSIS — E1165 Type 2 diabetes mellitus with hyperglycemia: Secondary | ICD-10-CM

## 2019-04-16 DIAGNOSIS — Z9189 Other specified personal risk factors, not elsewhere classified: Secondary | ICD-10-CM

## 2019-04-16 DIAGNOSIS — Z794 Long term (current) use of insulin: Secondary | ICD-10-CM

## 2019-04-16 NOTE — Telephone Encounter (Signed)
Requested medication (s) are due for refill today: no  Requested medication (s) are on the active medication list: yes  Last refill:  10/22/18   Future visit scheduled: yes  Notes to clinic:  Cardiovascular: antilipid- statins failed  Requested Prescriptions  Pending Prescriptions Disp Refills   atorvastatin (LIPITOR) 10 MG tablet [Pharmacy Med Name: ATORVASTATIN 10 MG TABLET] 90 tablet 1    Sig: Take 1 tablet (10 mg total) by mouth daily at 6 PM.      Cardiovascular:  Antilipid - Statins Failed - 04/16/2019  6:07 PM      Failed - LDL in normal range and within 360 days    LDL Chol Calc (NIH)  Date Value Ref Range Status  10/22/2018 30 0 - 99 mg/dL Final          Failed - HDL in normal range and within 360 days    HDL  Date Value Ref Range Status  10/22/2018 23 (L) >39 mg/dL Final          Failed - Triglycerides in normal range and within 360 days    Triglycerides  Date Value Ref Range Status  10/22/2018 422 (H) 0 - 149 mg/dL Final          Passed - Total Cholesterol in normal range and within 360 days    Cholesterol, Total  Date Value Ref Range Status  10/22/2018 113 100 - 199 mg/dL Final          Passed - Patient is not pregnant      Passed - Valid encounter within last 12 months    Recent Outpatient Visits           3 months ago Type 2 diabetes mellitus with hyperglycemia, with long-term current use of insulin (HCC)   [facility], Asencion Partridge, MD   5 months ago Type 2 diabetes mellitus with hyperglycemia, with long-term current use of insulin [facility]   [facility], Asencion Partridge, MD   9 months ago Essential hypertension   [facility], Asencion Partridge, MD   1 year ago Type 2 diabetes mellitus with hyperglycemia, with long-term current use of insulin [facility])   [facility], Asencion Partridge, MD   1 year ago Type 2 diabetes mellitus with hyperglycemia, with long-term current use of insulin [facility])   [facility], Asencion Partridge, MD       Future Appointments             In 6 days Shade Flood, MD [facility] [place], [facility]

## 2019-04-22 ENCOUNTER — Other Ambulatory Visit: Payer: Self-pay

## 2019-04-22 ENCOUNTER — Ambulatory Visit (INDEPENDENT_AMBULATORY_CARE_PROVIDER_SITE_OTHER): Payer: BC Managed Care – PPO

## 2019-04-22 ENCOUNTER — Encounter: Payer: Self-pay | Admitting: Family Medicine

## 2019-04-22 ENCOUNTER — Ambulatory Visit (INDEPENDENT_AMBULATORY_CARE_PROVIDER_SITE_OTHER): Payer: BC Managed Care – PPO | Admitting: Family Medicine

## 2019-04-22 VITALS — BP 123/85 | HR 91 | Temp 97.9°F | Ht 74.0 in | Wt 361.0 lb

## 2019-04-22 DIAGNOSIS — Z794 Long term (current) use of insulin: Secondary | ICD-10-CM

## 2019-04-22 DIAGNOSIS — M545 Low back pain, unspecified: Secondary | ICD-10-CM

## 2019-04-22 DIAGNOSIS — Z6841 Body Mass Index (BMI) 40.0 and over, adult: Secondary | ICD-10-CM

## 2019-04-22 DIAGNOSIS — E1165 Type 2 diabetes mellitus with hyperglycemia: Secondary | ICD-10-CM | POA: Diagnosis not present

## 2019-04-22 DIAGNOSIS — E785 Hyperlipidemia, unspecified: Secondary | ICD-10-CM

## 2019-04-22 DIAGNOSIS — I10 Essential (primary) hypertension: Secondary | ICD-10-CM | POA: Diagnosis not present

## 2019-04-22 NOTE — Progress Notes (Signed)
Subjective:  Patient ID: Phillip Frye, male    DOB: September 12, 1970  Age: 49 y.o. MRN: [id number]  CC:  Chief Complaint  Patient presents with  . Follow-up    on diabetes. pt states its improving. pt states his BS has been running in the 130's yester was even in the 90's. pt checks his BS 3x daily. pt still has his back pain from last visit pain comes and gos.    HPI Phillip Frye presents for   Diabetes: Previously complicated by hyperglycemia, medication nonadherence or partial adherence.  Was referred to endocrinology in September, evaluated by Dr. Kelton Frye.  Had been started on Ozempic, decreased Basaglar to 45 units, continue Metformin.  Ultimately Phillip Frye must stop Metformin as well.  R was decreased and ultimately discontinued with decreasing blood sugars.  Home readings were under 130 when discussed in November, had stopped Metformin as well.  December 30 appointment with endocrinology noted.  In office blood sugars 233 at that time.  Advised to restart Metformin.  33-monthfollow-up was planned.  Has appointment April 5. Home readings: Fasting: 90, 130, 124 Postprandials - not checking.  Taking ozempic, metformin (1060mBID, but skips if blood sugar under 100).  No symptomatic hypoglycemia.  Working 12-9. Trying to lose weight - would like to meet with medical bariatric specialist.  Gastric bypass 6-7 yrs ago. Declines surgical eval.   Microalbumin: Normal in 2019, due for repeat testing Optho, foot exam, pneumovax: Up-to-date He is on statin with Lipitor 10 mg daily, losartan for ARB, 100 mg daily.  Lab Results  Component Value Date   HGBA1C 6.9 (A) 12/23/2018   HGBA1C 10.5 (A) 10/22/2018   HGBA1C 10.4 (H) 10/22/2018   Lab Results  Component Value Date   LDLCALC 30 10/22/2018   CREATININE 1.05 03/17/2019   Hyperlipidemia: Lipitor 10 mg daily.  No new myalgias/side effects. Taking daily.   Lab Results  Component Value Date   CHOL 113 10/22/2018   HDL 23 (L)  10/22/2018   LDLCALC 30 10/22/2018   TRIG 422 (H) 10/22/2018   CHOLHDL 4.9 10/22/2018   Lab Results  Component Value Date   ALT 20 10/22/2018   AST 17 10/22/2018   ALKPHOS 95 10/22/2018   BILITOT 0.3 10/22/2018   Hypertension: Losartan 100 mg daily, Toprol-XL 50 mg daily. No new side effects.  On toprol for afib, eliquis for anticoag. No new bleeding. Cardiology Dr. KaDoylene Frye Hx of OSA, not using CPAP. Cost issues with getting fixed, plans on getting done next month. Sleep specialist - Dr Phillip Frye Home readings: none.  BP Readings from Last 3 Encounters:  04/22/19 123/85  03/17/19 125/87  02/10/19 136/68   Lab Results  Component Value Date   CREATININE 1.05 03/17/2019   Low back pain Discussed in November, appear to be mechanical/strain.  No red flags on exam.  Symptomatic care was discussed with Tylenol with RTC precautions if persistent or worsening.  Imaging was deferred at that time. Still some stiffness in low back since last visit.  No bowel or bladder incontinence, no saddle anesthesia, no lower extremity weakness.  Has been under care of ortho for knee issues. Currently in PT for knee issues. - ACL repair 01/29/19. Ortho - Dr. GrBerenice Frye Mid back stiffness.  No recent tylenol.  History Patient Active Problem List   Diagnosis Date Noted  . Type 2 diabetes mellitus without complication, without long-term current use of insulin ([place]12/30/2020  . Left anterior cruciate ligament  tear 01/29/2019  . Acute medial meniscus tear of left knee 01/29/2019  . Acute lateral meniscus tear of left knee 01/29/2019  . 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 Type 2 diabetes mellitus with hyperglycemia, with long-term current use of insulin ([place]) 10/28/2018  . Dyslipidemia 10/28/2018  . Primary hypercoagulable state ([place]) 01/18/2018  . 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 Abnormal EKG 12/23/2012  . Obesity, Class III, BMI 40-49.9 (morbid obesity) ([place]) 12/11/2012  .  SOB (shortness of breath) 09/22/2012  . Pulmonary embolism, bilateral ([place]) 09/22/2012  . Diabetes mellitus without complication ([place])   . DENTAL PAIN 03/31/2008  . CARPAL TUNNEL SYNDROME, BILATERAL 10/09/2007  . ULNAR NEUROPATHY 10/09/2007  . DENTAL CARIES 10/09/2007  . GERD 10/09/2007  . INGUINAL PAIN, BILATERAL 02/26/2007  . KNEE PAIN, RIGHT 12/19/2006  . FOOT PAIN, BILATERAL 12/19/2006  . HYPERTENSION 09/18/2006  . AVASCULAR NECROSIS 09/18/2006  . Sleep apnea 09/18/2006   Past Medical History:  Diagnosis Date  . Arthritis   . Atrial fibrillation ([place])   . Diabetes mellitus without complication ([place])   . Dysrhythmia    unknown type  . GERD (gastroesophageal reflux disease)   . Hypertension    MD stopped BP med 1 yr ago due to normal BP  . Obesity   . Pulmonary embolism, bilateral ([place]) 09/2012   on xarelto  . Shortness of breath    with exertion  . Sleep apnea    uses c pap    Past Surgical History:  Procedure Laterality Date  . BREATH TEK H PYLORI N/A 12/28/2012   Procedure: BREATH TEK H PYLORI;  Ullman: Phillip Curry, MD;  Location: [facility];  Service: General;  Laterality: N/A;  . HIP SURGERY Bilateral    to "shave gthe bones"  . KNEE ARTHROSCOPY WITH ANTERIOR CRUCIATE LIGAMENT (ACL) REPAIR Left 01/29/2019   Procedure: KNEE ARTHROSCOPY WITH CHONDROPLASTY AND  ANTERIOR CRUCIATE LIGAMENT (ACL) RPAIR WITH [place];  Champagne: Phillip Leitz, MD;  Location: [facility];  Service: Orthopedics;  Laterality: Left;  . LAPAROSCOPIC GASTRIC SLEEVE RESECTION N/A 05/31/2013   Procedure: LAPAROSCOPIC GASTRIC SLEEVE RESECTION;  [place]: Phillip Curry, MD;  Location: [facility];  Service: General;  Laterality: N/A;   No Known Allergies Prior to Admission medications   Medication Sig Start Date End Date Taking? Authorizing Provider  atorvastatin (LIPITOR) 10 MG tablet TAKE 1 TABLET (10 MG TOTAL) BY MOUTH DAILY AT 6 PM. 04/19/19  Yes Phillip Agreste, MD  blood glucose  meter kit and supplies Dispense based on patient and insurance preference. Use up to four times daily as directed. (FOR ICD-10 E10.9, E11.9). 01/15/18  Yes Phillip Agreste, MD  Blood Glucose Monitoring Suppl (FREESTYLE LITE) DEVI  01/15/18  Yes [provider]  CINNAMON PO Take 500 mg by mouth 2 (two) times daily.   Yes [provider]  ELIQUIS 5 MG TABS tablet TAKE 1 TABLET BY MOUTH 2 TIMES DAILY FOR 30 DAYS 10/21/18  Yes Phillip Agreste, MD  glucose blood test strip Up to 3 times per day - DM2 with hyperglycemia. Uncontrolled. 10/22/18  Yes Phillip Agreste, MD  Lancets (FREESTYLE) lancets  01/15/18  Yes [provider]  losartan (COZAAR) 50 MG tablet Take 100 mg by mouth daily.    Yes [provider]  metFORMIN (GLUCOPHAGE) 1000 MG tablet Take 1 tablet (1,000 mg total) by mouth 2 (two) times daily with a meal. 02/10/19  Yes Shamleffer, Melanie Crazier, MD  metoprolol succinate (TOPROL-XL) 50 MG 24 hr tablet Take 50 mg by mouth daily. Take with or immediately following a meal.   Yes [provider]  oxyCODONE-acetaminophen (PERCOCET/ROXICET) 5-325 MG tablet Take 1-2 tablets by mouth every 6 (six) hours as needed for severe pain. 01/29/19  Yes Gary Fleet, PA-C  Semaglutide,0.25 or 0.5MG/DOS, (OZEMPIC, 0.25 OR 0.5 MG/DOSE,) 2 MG/1.5ML SOPN Inject 0.5 mg into the skin once a week. 10/28/18  Yes Shamleffer, Melanie Crazier, MD  Insulin Pen Needle (PEN NEEDLES) 32G X 4 MM MISC 1 application by Does not apply route daily. 10/22/18   Phillip Agreste, MD  lidocaine (LIDODERM) 5 % Place 1 patch onto the skin daily. Remove & Discard patch within 12 hours or as directed by MD Patient not taking: Reported on 04/22/2019 03/17/19   Henderly, Britni A, PA-C  amLODipine (NORVASC) 5 MG tablet Take 1 tablet (5 mg total) by mouth daily. 01/01/18 12/23/18  Phillip Agreste, MD  levocetirizine (XYZAL) 5 MG tablet Take 1 tablet (5 mg total) by mouth every evening. 08/23/17  01/26/19  Kennyth Arnold, FNP   Social History   Socioeconomic History  . Marital status: Single    Spouse name: Not on file  . Number of children: 0  . Years of education: Not on file  . Highest education level: Not on file  Occupational History  . Not on file  Tobacco Use  . Smoking status: Current Every Day Smoker    Packs/day: 1.00    Years: 6.00    Pack years: 6.00    Types: Cigars  . Smokeless tobacco: Never Used  . Tobacco comment: 3 cigars a day  Substance and Sexual Activity  . Alcohol use: Yes    Comment: occasional  . Drug use: No  . Sexual activity: Not on file  Other Topics Concern  . Not on file  Social History Narrative   Lives alone.  Works at [employer].   Social Determinants of Health   Financial Resource Strain:   . Difficulty of Paying Living Expenses:   Food Insecurity:   . Worried About Charity fundraiser in the Last Year:   . Arboriculturist in the Last Year:   Transportation Needs:   . Film/video editor (Medical):   Marland Kitchen Lack of Transportation (Non-Medical):   Physical Activity:   . Days of Exercise per Week:   . Minutes of Exercise per Session:   Stress:   . Feeling of Stress :   Social Connections:   . Frequency of Communication with Friends and Family:   . Frequency of Social Gatherings with Friends and Family:   . Attends Religious Services:   . Active Member of Clubs or Organizations:   . Attends Archivist Meetings:   Marland Kitchen Marital Status:   Intimate Partner Violence:   . Fear of Current or Ex-Partner:   . Emotionally Abused:   Marland Kitchen Physically Abused:   . Sexually Abused:     Review of Systems  Per HPI.   Objective:   Vitals:   04/22/19 0853  BP: 123/85  Pulse: 91  Temp: 97.9 F (36.6 C)  TempSrc: Temporal  SpO2: 98%  Weight: (!) 361 lb (163.7 kg)  Height: 6' 2" (1.88 m)    Physical Exam Vitals reviewed.  Constitutional:      Appearance: He is well-developed. He is obese.  HENT:     Head:  Normocephalic and atraumatic.  Eyes:  Pupils: Pupils are equal, round, and reactive to light.  Neck:     Vascular: No carotid bruit or JVD.  Cardiovascular:     Rate and Rhythm: Normal rate and regular rhythm.     Heart sounds: Normal heart sounds. No murmur.  Pulmonary:     Effort: Pulmonary effort is normal.     Breath sounds: Normal breath sounds. No rales.  Musculoskeletal:     Comments: Lumbar spine no focal bony tenderness.  Describes area of discomfort as midline lower lumbar spine.  Flexion 90 degrees, slight decreased lateral flexion bilaterally, rotation bilaterally.  Seated straight leg raise negative, able to heel/toe walk.   Skin:    General: Skin is warm and dry.  Neurological:     Mental Status: He is alert and oriented to person, place, and time.        Assessment & Plan:  Phillip Frye is a 49 y.o. male . Type 2 diabetes mellitus with hyperglycemia, with long-term current use of insulin (HCC) - Plan: Hemoglobin A1c, Microalbumin / creatinine urine ratio  -Significantly improved control last A1c.  Home readings overall reassuring.  We will continue Ozempic, Metformin, has follow-up with endocrinology next month.  Check urine microalbumin, A1c obtained today for that appointment.  Hyperlipidemia, unspecified hyperlipidemia type - Plan: Lipid panel  -Tolerating Lipitor, continue same  Essential hypertension - Plan: Comprehensive metabolic panel  -Stable on current regimen, continue same.  Continue follow-up with for atrial fibrillation, currently on rate control and anticoagulation without side effects.  Denies new bleeding.  Midline low back pain without sciatica, unspecified chronicity - Plan: DG Lumbar Spine 2-3 Views, Ambulatory referral to Physical Therapy  -More stiffness than pain, check x-ray but likely deconditioning, component of weight.  -Tylenol as needed, range of motion, stretches, handout given, refer to physical therapy at same location for knee  PT.  Recheck 6 weeks, RTC precautions  BMI 45.0-49.9, adult ([place])  - number provided to medical bariatrics to discuss weight loss at his request.   No orders of the defined types were placed in this encounter.  Patient Instructions    Here is number for medical bariatric/weight loss specialist. Let me know if they need a referral.  Dennard Nip, MD Medical Weight Loss Management  . [phone]  I will check diabetes test today, but keep follow up with endocrinology.   No med changes at this time.  Tylenol as needed for back pain, but I will refer you to physical therapy for the treatment as well.  See information below on back pain.  Try range of motion, stretches few times per day to help with stiffness.  Recheck 6 weeks or back, sooner if needed.   Chronic Back Pain When back pain lasts longer than 3 months, it is called chronic back pain.The cause of your back pain may not be known. Some common causes include:  Wear and tear (degenerative disease) of the bones, ligaments, or disks in your back.  Inflammation and stiffness in your back (arthritis). People who have chronic back pain often go through certain periods in which the pain is more intense (flare-ups). Many people can learn to manage the pain with home care. Follow these instructions at home: Pay attention to any changes in your symptoms. Take these actions to help with your pain: Activity   Avoid bending and other activities that make the problem worse.  Maintain a proper position when standing or sitting: ? When standing, keep your upper back and neck  straight, with your shoulders pulled back. Avoid slouching. ? When sitting, keep your back straight and relax your shoulders. Do not round your shoulders or pull them backward.  Do not sit or stand in one place for long periods of time.  Take brief periods of rest throughout the day. This will reduce your pain. Resting in a lying or standing position is usually  better than sitting to rest.  When you are resting for longer periods, mix in some mild activity or stretching between periods of rest. This will help to prevent stiffness and pain.  Get regular exercise. Ask your health care provider what activities are safe for you.  Do not lift anything that is heavier than 10 lb (4.5 kg). Always use proper lifting technique, which includes: ? Bending your knees. ? Keeping the load close to your body. ? Avoiding twisting.  Sleep on a firm mattress in a comfortable position. Try lying on your side with your knees slightly bent. If you lie on your back, put a pillow under your knees. Managing pain  If directed, apply ice to the painful area. Your health care provider may recommend applying ice during the first 24-48 hours after a flare-up begins. ? Put ice in a plastic bag. ? Place a towel between your skin and the bag. ? Leave the ice on for 20 minutes, 2-3 times per day.  If directed, apply heat to the affected area as often as told by your health care provider. Use the heat source that your health care provider recommends, such as a moist heat pack or a heating pad. ? Place a towel between your skin and the heat source. ? Leave the heat on for 20-30 minutes. ? Remove the heat if your skin turns bright red. This is especially important if you are unable to feel pain, heat, or cold. You may have a greater risk of getting burned.  Try soaking in a warm tub.  Take over-the-counter and prescription medicines only as told by your health care provider.  Keep all follow-up visits as told by your health care provider. This is important. Contact a health care provider if:  You have pain that is not relieved with rest or medicine. Get help right away if:  You have weakness or numbness in one or both of your legs or feet.  You have trouble controlling your bladder or your bowels.  You have nausea or vomiting.  You have pain in your abdomen.  You  have shortness of breath or you faint. This information is not intended to replace advice given to you by your health care provider. Make sure you discuss any questions you have with your health care provider. Document Revised: 05/21/2018 Document Reviewed: 08/07/2016 Elsevier Patient Education  [employer].      If you have lab work done today you will be contacted with your lab results within the next 2 weeks.  If you have not heard from [place] then please contact us. The fastest way to get your results is to register for My Chart.   IF you received an x-ray today, you will receive an invoice from [facility]. Please contact [facility] at [phone] with questions or concerns regarding your invoice.   IF you received labwork today, you will receive an invoice from [facility]. Please contact [facility] at [phone] with questions or concerns regarding your invoice.   Our billing staff will not be able to assist you with questions regarding bills from these companies.  You will be contacted with the lab results as soon as they are available. The fastest way to get your results is to activate your My Chart account. Instructions are located on the last page of this paperwork. If you have not heard from [place] regarding the results in 2 weeks, please contact this office.         Signed, [name], MD [facility]

## 2019-04-22 NOTE — Patient Instructions (Addendum)
Here is number for medical bariatric/weight loss specialist. Let me know if they need a referral.  Quillian Quince, MD Medical Weight Loss Management  . [phone]  I will check diabetes test today, but keep follow up with endocrinology.   No med changes at this time.  Tylenol as needed for back pain, but I will refer you to physical therapy for the treatment as well.  See information below on back pain.  Try range of motion, stretches few times per day to help with stiffness.  Recheck 6 weeks or back, sooner if needed.   Chronic Back Pain When back pain lasts longer than 3 months, it is called chronic back pain.The cause of your back pain may not be known. Some common causes include:  Wear and tear (degenerative disease) of the bones, ligaments, or disks in your back.  Inflammation and stiffness in your back (arthritis). People who have chronic back pain often go through certain periods in which the pain is more intense (flare-ups). Many people can learn to manage the pain with home care. Follow these instructions at home: Pay attention to any changes in your symptoms. Take these actions to help with your pain: Activity   Avoid bending and other activities that make the problem worse.  Maintain a proper position when standing or sitting: ? When standing, keep your upper back and neck straight, with your shoulders pulled back. Avoid slouching. ? When sitting, keep your back straight and relax your shoulders. Do not round your shoulders or pull them backward.  Do not sit or stand in one place for long periods of time.  Take brief periods of rest throughout the day. This will reduce your pain. Resting in a lying or standing position is usually better than sitting to rest.  When you are resting for longer periods, mix in some mild activity or stretching between periods of rest. This will help to prevent stiffness and pain.  Get regular exercise. Ask your health care provider what  activities are safe for you.  Do not lift anything that is heavier than 10 lb (4.5 kg). Always use proper lifting technique, which includes: ? Bending your knees. ? Keeping the load close to your body. ? Avoiding twisting.  Sleep on a firm mattress in a comfortable position. Try lying on your side with your knees slightly bent. If you lie on your back, put a pillow under your knees. Managing pain  If directed, apply ice to the painful area. Your health care provider may recommend applying ice during the first 24-48 hours after a flare-up begins. ? Put ice in a plastic bag. ? Place a towel between your skin and the bag. ? Leave the ice on for 20 minutes, 2-3 times per day.  If directed, apply heat to the affected area as often as told by your health care provider. Use the heat source that your health care provider recommends, such as a moist heat pack or a heating pad. ? Place a towel between your skin and the heat source. ? Leave the heat on for 20-30 minutes. ? Remove the heat if your skin turns bright red. This is especially important if you are unable to feel pain, heat, or cold. You may have a greater risk of getting burned.  Try soaking in a warm tub.  Take over-the-counter and prescription medicines only as told by your health care provider.  Keep all follow-up visits as told by your health care provider. This is important. Contact a  health care provider if:  You have pain that is not relieved with rest or medicine. Get help right away if:  You have weakness or numbness in one or both of your legs or feet.  You have trouble controlling your bladder or your bowels.  You have nausea or vomiting.  You have pain in your abdomen.  You have shortness of breath or you faint. This information is not intended to replace advice given to you by your health care provider. Make sure you discuss any questions you have with your health care provider. Document Revised: 05/21/2018  Document Reviewed: 08/07/2016 Elsevier Patient Education  [employer].      If you have lab work done today you will be contacted with your lab results within the next 2 weeks.  If you have not heard from [place] then please contact us. The fastest way to get your results is to register for My Chart.   IF you received an x-ray today, you will receive an invoice from [facility]. Please contact [facility] at [phone] with questions or concerns regarding your invoice.   IF you received labwork today, you will receive an invoice from [facility]. Please contact [facility] at [phone] with questions or concerns regarding your invoice.   Our billing staff will not be able to assist you with questions regarding bills from these companies.  You will be contacted with the lab results as soon as they are available. The fastest way to get your results is to activate your My Chart account. Instructions are located on the last page of this paperwork. If you have not heard from [place] regarding the results in 2 weeks, please contact this office.

## 2019-04-23 LAB — MICROALBUMIN / CREATININE URINE RATIO
Creatinine, Urine: 244.9 mg/dL
Microalb/Creat Ratio: 5 mg/g creat (ref 0–29)
Microalbumin, Urine: 13 ug/mL

## 2019-04-23 LAB — LIPID PANEL
Chol/HDL Ratio: 3.8 ratio (ref 0.0–5.0)
Cholesterol, Total: 114 mg/dL (ref 100–199)
HDL: 30 mg/dL — ABNORMAL LOW (ref >39)
LDL Chol Calc (NIH): 62 mg/dL (ref 0–99)
Triglycerides: 124 mg/dL (ref 0–149)
VLDL Cholesterol Cal: 22 mg/dL (ref 5–40)

## 2019-04-23 LAB — COMPREHENSIVE METABOLIC PANEL
ALT: 17 IU/L (ref 0–44)
AST: 19 IU/L (ref 0–40)
Albumin/Globulin Ratio: 1.3 (ref 1.2–2.2)
Albumin: 3.9 g/dL — ABNORMAL LOW (ref 4.0–5.0)
Alkaline Phosphatase: 88 IU/L (ref 39–117)
BUN/Creatinine Ratio: 10 (ref 9–20)
BUN: 12 mg/dL (ref 6–24)
Bilirubin Total: 0.5 mg/dL (ref 0.0–1.2)
CO2: 23 mmol/L (ref 20–29)
Calcium: 9.5 mg/dL (ref 8.7–10.2)
Chloride: 105 mmol/L (ref 96–106)
Creatinine, Ser: 1.2 mg/dL (ref 0.76–1.27)
GFR calc Af Amer: 81 mL/min/{1.73_m2} (ref >59)
GFR calc non Af Amer: 70 mL/min/{1.73_m2} (ref >59)
Globulin, Total: 2.9 g/dL (ref 1.5–4.5)
Glucose: 106 mg/dL — ABNORMAL HIGH (ref 65–99)
Potassium: 4.5 mmol/L (ref 3.5–5.2)
Sodium: 141 mmol/L (ref 134–144)
Total Protein: 6.8 g/dL (ref 6.0–8.5)

## 2019-04-23 LAB — HEMOGLOBIN A1C
Est. average glucose Bld gHb Est-mCnc: 137 mg/dL
Hgb A1c MFr Bld: 6.4 % — ABNORMAL HIGH (ref 4.8–5.6)

## 2019-04-27 ENCOUNTER — Ambulatory Visit: Payer: Medicare Other | Admitting: Adult Health

## 2019-05-04 ENCOUNTER — Emergency Department (HOSPITAL_COMMUNITY)
Admission: EM | Admit: 2019-05-04 | Discharge: 2019-05-04 | Disposition: A | Discharge status: Home / Self Care | Payer: BC Managed Care – PPO | Attending: Emergency Medicine | Admitting: Emergency Medicine

## 2019-05-04 ENCOUNTER — Other Ambulatory Visit: Payer: Self-pay

## 2019-05-04 ENCOUNTER — Encounter (HOSPITAL_COMMUNITY): Payer: Self-pay | Admitting: Emergency Medicine

## 2019-05-04 DIAGNOSIS — I10 Essential (primary) hypertension: Secondary | ICD-10-CM | POA: Insufficient documentation

## 2019-05-04 DIAGNOSIS — Z79899 Other long term (current) drug therapy: Secondary | ICD-10-CM | POA: Insufficient documentation

## 2019-05-04 DIAGNOSIS — F17290 Nicotine dependence, other tobacco product, uncomplicated: Secondary | ICD-10-CM | POA: Insufficient documentation

## 2019-05-04 DIAGNOSIS — R072 Precordial pain: Secondary | ICD-10-CM | POA: Diagnosis not present

## 2019-05-04 DIAGNOSIS — E119 Type 2 diabetes mellitus without complications: Secondary | ICD-10-CM | POA: Insufficient documentation

## 2019-05-04 DIAGNOSIS — Z7984 Long term (current) use of oral hypoglycemic drugs: Secondary | ICD-10-CM | POA: Insufficient documentation

## 2019-05-04 DIAGNOSIS — I4891 Unspecified atrial fibrillation: Secondary | ICD-10-CM | POA: Diagnosis not present

## 2019-05-04 DIAGNOSIS — R079 Chest pain, unspecified: Secondary | ICD-10-CM

## 2019-05-04 DIAGNOSIS — R0789 Other chest pain: Secondary | ICD-10-CM | POA: Diagnosis not present

## 2019-05-04 LAB — BASIC METABOLIC PANEL
Anion gap: 10 (ref 5–15)
BUN: 14 mg/dL (ref 6–20)
CO2: 26 mmol/L (ref 22–32)
Calcium: 8.9 mg/dL (ref 8.9–10.3)
Chloride: 103 mmol/L (ref 98–111)
Creatinine, Ser: 1.21 mg/dL (ref 0.61–1.24)
GFR calc Af Amer: >60 mL/min (ref >60)
GFR calc non Af Amer: >60 mL/min (ref >60)
Glucose, Bld: 132 mg/dL — ABNORMAL HIGH (ref 70–99)
Potassium: 3.8 mmol/L (ref 3.5–5.1)
Sodium: 139 mmol/L (ref 135–145)

## 2019-05-04 LAB — CBC WITH DIFFERENTIAL/PLATELET
Abs Immature Granulocytes: 0.01 10*3/uL (ref 0.00–0.07)
Basophils Absolute: 0 10*3/uL (ref 0.0–0.1)
Basophils Relative: 0 %
Eosinophils Absolute: 0.1 10*3/uL (ref 0.0–0.5)
Eosinophils Relative: 1 %
HCT: 44.9 % (ref 39.0–52.0)
Hemoglobin: 14.6 g/dL (ref 13.0–17.0)
Immature Granulocytes: 0 %
Lymphocytes Relative: 39 %
Lymphs Abs: 2.7 10*3/uL (ref 0.7–4.0)
MCH: 31.3 pg (ref 26.0–34.0)
MCHC: 32.5 g/dL (ref 30.0–36.0)
MCV: 96.1 fL (ref 80.0–100.0)
Monocytes Absolute: 0.7 10*3/uL (ref 0.1–1.0)
Monocytes Relative: 10 %
Neutro Abs: 3.3 10*3/uL (ref 1.7–7.7)
Neutrophils Relative %: 50 %
Platelets: 267 10*3/uL (ref 150–400)
RBC: 4.67 MIL/uL (ref 4.22–5.81)
RDW: 13.3 % (ref 11.5–15.5)
WBC: 6.8 10*3/uL (ref 4.0–10.5)
nRBC: 0 % (ref 0.0–0.2)

## 2019-05-04 LAB — URINALYSIS, ROUTINE W REFLEX MICROSCOPIC
Bilirubin Urine: NEGATIVE
Glucose, UA: NEGATIVE mg/dL
Hgb urine dipstick: NEGATIVE
Ketones, ur: NEGATIVE mg/dL
Leukocytes,Ua: NEGATIVE
Nitrite: NEGATIVE
Protein, ur: NEGATIVE mg/dL
Specific Gravity, Urine: 1.028 (ref 1.005–1.030)
pH: 5 (ref 5.0–8.0)

## 2019-05-04 LAB — MAGNESIUM: Magnesium: 2.1 mg/dL (ref 1.7–2.4)

## 2019-05-04 LAB — TROPONIN I (HIGH SENSITIVITY)
Troponin I (High Sensitivity): 13 ng/L (ref <18)
Troponin I (High Sensitivity): 14 ng/L (ref <18)

## 2019-05-04 NOTE — ED Provider Notes (Signed)
[facility] DEPT Provider Note   CSN: [id number] Arrival date & time: 05/04/19  1149     History Chief Complaint  Patient presents with  . Anxiety    Phillip Frye is a 49 y.o. male.  49 y.o male with a PMH of Afib, DM, presents to the ED with a chief complaint of chest pain.  Patient reports he was in physical therapy this morning when he suddenly began to feel intermittent substernal pressure, this does not have any radiation.  This occurred during exertion.  There are no alleviating or exacerbating factors.  He is currently taking metoprolol for rate control along with Eliquis for anticoagulation, reports he did not take any of these medications this morning.  He is reporting he had 7 blunts on Sunday, states that he felt like he was likely having a panic attack this morning.  He does endorse some shortness of breath, this is intermittent.  He reports a similar episode 3 weeks ago when he was in [place], thought this was likely due to " anxiety ", reports compliance with medication otherwise.  No prior history of CAD, does have a family history of CAD, does have a prior history of PE.  The history is provided by the patient.       Past Medical History:  Diagnosis Date  . Arthritis   . Atrial fibrillation ([place])   . Diabetes mellitus without complication ([place])   . Dysrhythmia    unknown type  . GERD (gastroesophageal reflux disease)   . Hypertension    MD stopped BP med 1 yr ago due to normal BP  . Obesity   . Pulmonary embolism, bilateral ([place]) 09/2012   on xarelto  . Shortness of breath    with exertion  . Sleep apnea    uses c pap     Patient Active Problem List   Diagnosis Date Noted  . Type 2 diabetes mellitus without complication, without long-term current use of insulin ([place]) 02/10/2019  . Left anterior cruciate ligament tear 01/29/2019  . Acute medial meniscus tear of left knee 01/29/2019  . Acute lateral meniscus tear of left  knee 01/29/2019  . 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 Type 2 diabetes mellitus with hyperglycemia, with long-term current use of insulin ([place]) 10/28/2018  . Dyslipidemia 10/28/2018  . Primary hypercoagulable state ([place]) 01/18/2018  . 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 Abnormal EKG 12/23/2012  . Obesity, Class III, BMI 40-49.9 (morbid obesity) ([place]) 12/11/2012  . SOB (shortness of breath) 09/22/2012  . Pulmonary embolism, bilateral ([place]) 09/22/2012  . Diabetes mellitus without complication ([place])   . DENTAL PAIN 03/31/2008  . CARPAL TUNNEL SYNDROME, BILATERAL 10/09/2007  . ULNAR NEUROPATHY 10/09/2007  . DENTAL CARIES 10/09/2007  . GERD 10/09/2007  . INGUINAL PAIN, BILATERAL 02/26/2007  . KNEE PAIN, RIGHT 12/19/2006  . FOOT PAIN, BILATERAL 12/19/2006  . HYPERTENSION 09/18/2006  . AVASCULAR NECROSIS 09/18/2006  . Sleep apnea 09/18/2006    Past Surgical History:  Procedure Laterality Date  . BREATH TEK H PYLORI N/A 12/28/2012   Procedure: BREATH TEK H PYLORI;  Lefevre: Gayland Curry, MD;  Location: [facility];  Service: General;  Laterality: N/A;  . HIP SURGERY Bilateral    to "shave gthe bones"  . KNEE ARTHROSCOPY WITH ANTERIOR CRUCIATE LIGAMENT (ACL) REPAIR Left 01/29/2019   Procedure: KNEE ARTHROSCOPY WITH CHONDROPLASTY AND  ANTERIOR CRUCIATE LIGAMENT (ACL) RPAIR WITH [place];  Deeb: Dorna Leitz, MD;  Location: [facility];  Service: Orthopedics;  Laterality: Left;  . LAPAROSCOPIC GASTRIC SLEEVE RESECTION N/A 05/31/2013   Procedure: LAPAROSCOPIC GASTRIC SLEEVE RESECTION;  Burich: Gayland Curry, MD;  Location: [facility];  Service: General;  Laterality: N/A;       Family History  Problem Relation Age of Onset  . Cirrhosis Father   . Diabetes Other   . Diabetes Other   . Emphysema Paternal Grandfather        smoked    Social History   Tobacco Use  . Smoking status: Current Every Day Smoker    Packs/day: 1.00    Years: 6.00     Pack years: 6.00    Types: Cigars  . Smokeless tobacco: Never Used  . Tobacco comment: 3 cigars a day  Substance Use Topics  . Alcohol use: Yes    Comment: occasional  . Drug use: No    Home Medications Prior to Admission medications   Medication Sig Start Date End Date Taking? Authorizing Provider  atorvastatin (LIPITOR) 10 MG tablet TAKE 1 TABLET (10 MG TOTAL) BY MOUTH DAILY AT 6 PM. 04/19/19   Wendie Agreste, MD  blood glucose meter kit and supplies Dispense based on patient and insurance preference. Use up to four times daily as directed. (FOR ICD-10 E10.9, E11.9). 01/15/18   Wendie Agreste, MD  Blood Glucose Monitoring Suppl (FREESTYLE LITE) DEVI  01/15/18   [provider]  CINNAMON PO Take 500 mg by mouth 2 (two) times daily.    [provider]  ELIQUIS 5 MG TABS tablet TAKE 1 TABLET BY MOUTH 2 TIMES DAILY FOR 30 DAYS 10/21/18   Wendie Agreste, MD  glucose blood test strip Up to 3 times per day - DM2 with hyperglycemia. Uncontrolled. 10/22/18   Wendie Agreste, MD  Lancets (FREESTYLE) lancets  01/15/18   [provider]  lidocaine (LIDODERM) 5 % Place 1 patch onto the skin daily. Remove & Discard patch within 12 hours or as directed by MD Patient not taking: Reported on 04/22/2019 03/17/19   Henderly, Britni A, PA-C  losartan (COZAAR) 50 MG tablet Take 100 mg by mouth daily.     [provider]  metFORMIN (GLUCOPHAGE) 1000 MG tablet Take 1 tablet (1,000 mg total) by mouth 2 (two) times daily with a meal. 02/10/19   Shamleffer, Melanie Crazier, MD  metoprolol succinate (TOPROL-XL) 50 MG 24 hr tablet Take 50 mg by mouth daily. Take with or immediately following a meal.    [provider]  oxyCODONE-acetaminophen (PERCOCET/ROXICET) 5-325 MG tablet Take 1-2 tablets by mouth every 6 (six) hours as needed for severe pain. 01/29/19   Gary Fleet, PA-C  Semaglutide,0.25 or 0.'5MG'$ /DOS, (OZEMPIC, 0.25 OR 0.5 MG/DOSE,) 2 MG/1.5ML SOPN Inject 0.5 mg  into the skin once a week. 10/28/18   Shamleffer, Melanie Crazier, MD  amLODipine (NORVASC) 5 MG tablet Take 1 tablet (5 mg total) by mouth daily. 01/01/18 12/23/18  Wendie Agreste, MD  levocetirizine (XYZAL) 5 MG tablet Take 1 tablet (5 mg total) by mouth every evening. 08/23/17 01/26/19  Kennyth Arnold, FNP    Allergies    Patient has no known allergies.  Review of Systems   Review of Systems  Constitutional: Negative for fever.  HENT: Negative for sore throat.   Respiratory: Positive for shortness of breath.   Cardiovascular: Positive for chest pain and palpitations. Negative for leg swelling.  Gastrointestinal: Negative for abdominal pain, nausea and vomiting.  Genitourinary: Negative for  flank pain.  Musculoskeletal: Negative for back pain.  Neurological: Negative for light-headedness and headaches.  All other systems reviewed and are negative.   Physical Exam Updated Vital Signs BP (!) 146/99 (BP Location: Right Arm)   Pulse (!) 106   Temp 98.9 F (37.2 C) (Oral)   Resp 17   SpO2 100%   Physical Exam Vitals and nursing note reviewed.  Constitutional:      Appearance: Normal appearance. He is diaphoretic.  HENT:     Head: Normocephalic and atraumatic.     Nose: Nose normal.     Mouth/Throat:     Mouth: Mucous membranes are moist.  Eyes:     Pupils: Pupils are equal, round, and reactive to light.  Cardiovascular:     Rate and Rhythm: Normal rate.  Pulmonary:     Effort: Pulmonary effort is normal.  Abdominal:     General: Abdomen is flat.     Tenderness: There is no abdominal tenderness.  Musculoskeletal:     Cervical back: Normal range of motion and neck supple.  Skin:    General: Skin is warm.  Neurological:     Mental Status: He is alert and oriented to person, place, and time.     ED Results / Procedures / Treatments   Labs (all labs ordered are listed, but only abnormal results are displayed) Labs Reviewed  BASIC METABOLIC PANEL - Abnormal;  Notable for the following components:      Result Value   Glucose, Bld 132 (*)    All other components within normal limits  CBC WITH DIFFERENTIAL/PLATELET  MAGNESIUM  URINALYSIS, ROUTINE W REFLEX MICROSCOPIC  TROPONIN I (HIGH SENSITIVITY)  TROPONIN I (HIGH SENSITIVITY)    EKG EKG Interpretation  Date/Time:  Tuesday May 04 2019 12:27:27 EDT Ventricular Rate:  127 PR Interval:    QRS Duration: 87 QT Interval:  355 QTC Calculation: 502 R Axis:   45 Text Interpretation: Atrial fibrillation Abnormal T, consider ischemia, lateral leads Minimal ST elevation, inferior leads Prolonged QT interval Baseline wander in lead(s) V1 V4 V5 V6 12 Lead; [place] When compared to prior, new Aflutter with RVR. NO STEMI Confirmed by Antony Blackbird [phone]) on 05/04/2019 3:16:17 PM   Radiology No results found.  Procedures Procedures (including critical care time)  Medications Ordered in ED Medications - No data to display  ED Course  I have reviewed the triage vital signs and the nursing notes.  Pertinent labs & imaging results that were available during my care of the patient were reviewed by me and considered in my medical decision making (see chart for details).    MDM Rules/Calculators/A&P   Patient with a PMH of DM, Afib currently on Eliquis along with metoprolol for rate control presents to the ED with complaints of sudden onset of chest pain while at PT this morning.  Reports a similar episode a few weeks ago, where he felt like "I might have had a panic attack ", he does report smoking 7 blunts on Sunday.  Vitals on arrival revealed a heart rate of 102, oxygen saturations 100%.  He does not report taking his Eliquis or metoprolol this morning.  Does endorse some shortness of breath.  He does not have any prior history of anxiety, reports he had googled the symptoms and felt like this was likely going on.  EKG obtained by nursing staff relieve a d HR of 127, irregularly irregular.   He does have a history of A. fib, has not taken  his medication on today's visit.  Some suspicion for ACS will obtain Trop.  Interpretation of his labs showed BMP with mild elevation of glucose, but doubt DKA. CBC with no leukocytosis, hemoglobin is within normal limits. Magnesium is unremarkable.  First troponin was negative at 13, will obtain delta.  Patient's heart rate has improved to the low 100s, reports he has not taken his location today, advised to take his Eliquis along with metoprolol when arriving home.  He reports he has called his cardiologist Dr. Doylene Canard while in the ED who will actually evaluate him tomorrow morning.  Patient was advised to discontinue marijuana use as this is likely exacerbating his symptoms.  4:22 PM repeat troponin was 14, remains unchanged. Lower suspicion for ACS, have her follow-up with Dr. Doylene Canard who has agreed to see him tomorrow morning. Return precautions discussed at length.   Portions of this note were generated with Lobbyist. Dictation errors may occur despite best attempts at proofreading.  Final Clinical Impression(s) / ED Diagnoses Final diagnoses:  Atrial fibrillation, unspecified type [facility])  Chest pain, unspecified type    Rx / DC Orders ED Discharge Orders    None       Janeece Fitting, PA-C 05/04/19 1623    Tegeler, Gwenyth Allegra, MD 05/04/19 [phone]

## 2019-05-04 NOTE — Discharge Instructions (Addendum)
Your laboratory results were within normal limits today.  Please go to your scheduled appointment with Dr. Algie Coffer tomorrow morning for further management of your A. fib.  Please attempt to discontinue marijuana use as this is likely exacerbating your history of A. fib.

## 2019-05-04 NOTE — ED Triage Notes (Signed)
Patient here from home reporting anxiety after "smoking 7-8" marijuana joints. Reports that "this has been happening for almost a month".

## 2019-05-05 ENCOUNTER — Telehealth (HOSPITAL_COMMUNITY): Payer: Self-pay

## 2019-05-05 DIAGNOSIS — I480 Paroxysmal atrial fibrillation: Secondary | ICD-10-CM | POA: Diagnosis not present

## 2019-05-05 DIAGNOSIS — R002 Palpitations: Secondary | ICD-10-CM | POA: Diagnosis not present

## 2019-05-05 DIAGNOSIS — R0789 Other chest pain: Secondary | ICD-10-CM | POA: Diagnosis not present

## 2019-05-05 DIAGNOSIS — I10 Essential (primary) hypertension: Secondary | ICD-10-CM | POA: Diagnosis not present

## 2019-05-05 NOTE — Telephone Encounter (Signed)
Reached out to patient on ED report. Informed patient that we can schedule an ED f/u appointment he stated he saw his Cardiologist and has an monitor placed.

## 2019-05-11 ENCOUNTER — Encounter: Payer: Medicare Other | Admitting: Adult Health

## 2019-05-11 ENCOUNTER — Other Ambulatory Visit: Payer: Self-pay

## 2019-05-12 ENCOUNTER — Telehealth: Payer: Self-pay | Admitting: Adult Health

## 2019-05-12 DIAGNOSIS — E119 Type 2 diabetes mellitus without complications: Secondary | ICD-10-CM | POA: Diagnosis not present

## 2019-05-12 DIAGNOSIS — I480 Paroxysmal atrial fibrillation: Secondary | ICD-10-CM | POA: Diagnosis not present

## 2019-05-12 DIAGNOSIS — I10 Essential (primary) hypertension: Secondary | ICD-10-CM | POA: Diagnosis not present

## 2019-05-12 DIAGNOSIS — I4892 Unspecified atrial flutter: Secondary | ICD-10-CM | POA: Diagnosis not present

## 2019-05-12 NOTE — Telephone Encounter (Signed)
Pt called and states he is needing to discuss his Cpap machine with RN. Please

## 2019-05-12 NOTE — Telephone Encounter (Signed)
LMVM for pt to return call.   

## 2019-05-12 NOTE — Progress Notes (Signed)
This encounter was created in error - please disregard.

## 2019-05-13 ENCOUNTER — Other Ambulatory Visit: Payer: Self-pay

## 2019-05-13 NOTE — Telephone Encounter (Signed)
I called pt and his sister took care of this, as the DME was not going to do anything until he paid on his bill.  Nothing needed on our part at this time.

## 2019-05-17 ENCOUNTER — Other Ambulatory Visit: Payer: Self-pay

## 2019-05-17 ENCOUNTER — Ambulatory Visit (INDEPENDENT_AMBULATORY_CARE_PROVIDER_SITE_OTHER): Payer: BC Managed Care – PPO | Admitting: Internal Medicine

## 2019-05-17 ENCOUNTER — Encounter: Payer: Self-pay | Admitting: Internal Medicine

## 2019-05-17 ENCOUNTER — Ambulatory Visit: Payer: BC Managed Care – PPO | Admitting: Internal Medicine

## 2019-05-17 VITALS — BP 128/78 | HR 87 | Temp 98.3°F | Ht 74.0 in | Wt 366.6 lb

## 2019-05-17 DIAGNOSIS — E119 Type 2 diabetes mellitus without complications: Secondary | ICD-10-CM

## 2019-05-17 DIAGNOSIS — I480 Paroxysmal atrial fibrillation: Secondary | ICD-10-CM | POA: Diagnosis not present

## 2019-05-17 MED ORDER — OZEMPIC (0.25 OR 0.5 MG/DOSE) 2 MG/1.5ML ~~LOC~~ SOPN: 0.5000 mg | PEN_INJECTOR | SUBCUTANEOUS | 3 refills | Status: DC

## 2019-05-17 MED ORDER — METFORMIN HCL 1000 MG PO TABS: 1000.0000 mg | ORAL_TABLET | Freq: Two times a day (BID) | ORAL | 3 refills | Status: DC

## 2019-05-17 NOTE — Patient Instructions (Signed)
-   Ozempic 0.5 mg weekly - Metformin 1000  Twice a day with meals      HOW TO TREAT LOW BLOOD SUGARS (Blood sugar LESS THAN 70 MG/DL)  Please follow the RULE OF 15 for the treatment of hypoglycemia treatment (when your (blood sugars are less than 70 mg/dL)    STEP 1: Take 15 grams of carbohydrates when your blood sugar is low, which includes:   3-4 GLUCOSE TABS  OR  3-4 OZ OF JUICE OR REGULAR SODA OR  ONE TUBE OF GLUCOSE GEL     STEP 2: RECHECK blood sugar in 15 MINUTES STEP 3: If your blood sugar is still low at the 15 minute recheck --> then, go back to STEP 1 and treat AGAIN with another 15 grams of carbohydrates.  

## 2019-05-17 NOTE — Progress Notes (Signed)
Name: Phillip Frye  Age/ Sex: 49 y.o., male   MRN/ DOB: [id number], 1970/02/20     PCP: Wendie Agreste, MD   Reason for Endocrinology Evaluation: Type 2 Diabetes Mellitus  Initial Endocrine Consultative Visit: 10/28/2018    PATIENT IDENTIFIER: [name] is a 49 y.o. male with a past medical history of HTN, T2DM,A. Fib , OSA and PE. The patient has followed with Endocrinology clinic since 10/28/2018 for consultative assistance with management of his diabetes.  DIABETIC HISTORY:  Mr. Legrande was diagnosed with DM in 2019. Has been on victoza and Januvia .Hemoglobin A1c has ranged from  10.5% in 2020, peaking at 12.8% in 2019.   ON his initial visit to our clinic his A1c was 10.5%. He was on Basaglar and Metformin. Ozempic was added. Basaglar was discontinued 11/2018 due to hypoglycemia.   Works a 3rd shift 8 PM to [employer]   Eats 3 pm, 10 pm and 1 AM   SUBJECTIVE:   During the last visit (02/10/2019):  A1c was 6.9%. We continued Metformin and Ozempic.   Today (05/17/2019): Mr. Behnken is here for a 3 month follow up on diabetes. He checks his blood sugars 3 times daily. The patient did not being his meter today . He has not had hypoglycemic episodes since being off basaglar but he also has not been taking metformin. Otherwise, the patient has not required any recent emergency interventions for hypoglycemia but had  Recent ED visits for chest pain and A.Fib.     ROS: As per HPI and as detailed below: Review of Systems  Respiratory: Positive for shortness of breath.   Cardiovascular: Positive for palpitations and leg swelling. Negative for chest pain.  Gastrointestinal: Negative for diarrhea and nausea.      HOME DIABETES REGIMEN:  Ozempic 0.5 mg weekly  Metformin 1000 mg BID     METER DOWNLOAD SUMMARY: Unable to download     HISTORY:  Past Medical History:  Past Medical History:  Diagnosis Date  . Arthritis   . Atrial  fibrillation ([place])   . Diabetes mellitus without complication ([place])   . Dysrhythmia    unknown type  . GERD (gastroesophageal reflux disease)   . Hypertension    MD stopped BP med 1 yr ago due to normal BP  . Obesity   . Pulmonary embolism, bilateral ([place]) 09/2012   on xarelto  . Shortness of breath    with exertion  . Sleep apnea    uses c pap    Past Surgical History:  Past Surgical History:  Procedure Laterality Date  . BREATH TEK H PYLORI N/A 12/28/2012   Procedure: BREATH TEK H PYLORI;  Karn: Phillip Curry, MD;  Location: [facility];  Service: General;  Laterality: N/A;  . HIP SURGERY Bilateral    to "shave gthe bones"  . KNEE ARTHROSCOPY WITH ANTERIOR CRUCIATE LIGAMENT (ACL) REPAIR Left 01/29/2019   Procedure: KNEE ARTHROSCOPY WITH CHONDROPLASTY AND  ANTERIOR CRUCIATE LIGAMENT (ACL) RPAIR WITH [place];  Fanfan: Phillip Leitz, MD;  Location: [facility];  Service: Orthopedics;  Laterality: Left;  . LAPAROSCOPIC GASTRIC SLEEVE RESECTION N/A 05/31/2013   Procedure: LAPAROSCOPIC GASTRIC SLEEVE RESECTION;  [place]: Phillip Curry, MD;  Location: [facility];  Service: General;  Laterality: N/A;    Social History:  reports that he has been smoking cigars. He has a 6.00 pack-year smoking history. He has never used smokeless tobacco. He reports current  alcohol use. He reports that he does not use drugs. Family History:  Family History  Problem Relation Age of Onset  . Cirrhosis Father   . Diabetes Other   . Diabetes Other   . Emphysema Paternal Grandfather        smoked     HOME MEDICATIONS: Allergies as of 05/17/2019   No Known Allergies     Medication List       Accurate as of May 17, 2019  3:36 PM. If you have any questions, ask your nurse or doctor.        atorvastatin 10 MG tablet Commonly known as: LIPITOR TAKE 1 TABLET (10 MG TOTAL) BY MOUTH DAILY AT 6 PM.   blood glucose meter kit and supplies Dispense based on patient and insurance  preference. Use up to four times daily as directed. (FOR ICD-10 E10.9, E11.9).   CINNAMON PO Take 500 mg by mouth 2 (two) times daily.   Eliquis 5 MG Tabs tablet Generic drug: apixaban TAKE 1 TABLET BY MOUTH 2 TIMES DAILY FOR 30 DAYS   freestyle lancets   FreeStyle Lite Devi   glucose blood test strip Up to 3 times per day - DM2 with hyperglycemia. Uncontrolled.   losartan 50 MG tablet Commonly known as: COZAAR Take 100 mg by mouth daily.   metFORMIN 1000 MG tablet Commonly known as: GLUCOPHAGE Take 1 tablet (1,000 mg total) by mouth 2 (two) times daily with a meal.   metoprolol succinate 100 MG 24 hr tablet Commonly known as: TOPROL-XL Take 100 mg by mouth daily. What changed: Another medication with the same name was removed. Continue taking this medication, and follow the directions you see here. Changed by: Dorita Sciara, MD   UNABLE TO FIND Med Name: Phillip Frye meter        OBJECTIVE:   Vital Signs: BP 128/78 (BP Location: Left Arm, Patient Position: Sitting, Cuff Size: Large)   Pulse 87   Temp 98.3 F (36.8 C)   Ht 6' 2" (1.88 m)   Wt (!) 366 lb 9.6 oz (166.3 kg)   SpO2 97%   BMI 47.07 kg/m   Wt Readings from Last 3 Encounters:  05/17/19 (!) 366 lb 9.6 oz (166.3 kg)  05/11/19 (!) 369 lb (167.4 kg)  04/22/19 (!) 361 lb (163.7 kg)     Exam: General: Pt appears well and is in NAD  Lungs: Clear with good BS bilat with no rales, rhonchi, or wheezes  Heart: RRR with normal S1 and S2 and no gallops; no murmurs; no rub  Abdomen: Normoactive bowel sounds, soft, nontender, without masses or organomegaly palpable  Extremities: Left  pretibial edema.normal right LE  Neuro: MS is good with appropriate affect, pt is alert and Ox3   DM foot exam: 05/17/2019  The skin of the feet is intact without sores or ulcerations. The pedal pulses are 2+ on right and 2+ on left. The sensation is decreased on the left  to a screening 5.07, 10 gram monofilament       DATA REVIEWED:  Lab Results  Component Value Date   HGBA1C 6.4 (H) 04/22/2019   HGBA1C 6.9 (A) 12/23/2018   HGBA1C 10.5 (A) 10/22/2018   Lab Results  Component Value Date   LDLCALC 62 04/22/2019   CREATININE 1.21 05/04/2019   Lab Results  Component Value Date   MICRALBCREAT 5 04/22/2019     Lab Results  Component Value Date   CHOL 114 04/22/2019   HDL 30 (L)  04/22/2019   LDLCALC 62 04/22/2019   TRIG 124 04/22/2019   CHOLHDL 3.8 04/22/2019         ASSESSMENT / PLAN / RECOMMENDATIONS:   1) Type 2 Diabetes Mellitus, Optimally controlled, Without complications - Most recent A1c of 6.4 %. Goal A1c < 7.0 %.    - Praised the pt on continued glycemic control and weight loss, he is motivated to continue with lifestyle changes.  - NO changes will be made today      MEDICATIONS:  Continue Ozempic 0.5 mg weekly   Continue Metformin 1000 mg BID  EDUCATION / INSTRUCTIONS:  BG monitoring instructions: Patient is instructed to check his blood sugars 2 times a day, fasting and bedtime.  Call [facility] if: BG persistently < 70 or > 300. . I reviewed the Rule of 15 for the treatment of hypoglycemia in detail with the patient. Literature supplied.    F/U in 5 months    Signed electronically by: Mack Guise, MD  [facility]  [facility] [address] [place], [place] [address] Phone: [phone] FAX: [phone]   CC: Wendie Agreste, MD [address] [place] [address] Phone: [phone]  Fax: [phone]  Return to Endocrinology clinic as below: Future Appointments  Date Time Provider [place]  06/03/2019  9:40 AM Wendie Agreste, MD PCP-PCP [facility]

## 2019-05-18 ENCOUNTER — Telehealth: Payer: Self-pay | Admitting: Internal Medicine

## 2019-05-18 NOTE — Telephone Encounter (Signed)
Spoke to pt and explained that she does not prescribe this medication and he should contact his PCP.

## 2019-05-18 NOTE — Telephone Encounter (Signed)
MEDICATION: Gabapentin   PHARMACY:  [facility] on [address]  IS THIS A 90 DAY SUPPLY :   IS PATIENT OUT OF MEDICATION: New Prescription Request  IF NOT; HOW MUCH IS LEFT:  Never had  LAST APPOINTMENT DATE: @4 /06/2019  NEXT APPOINTMENT DATE:@8 /16/2021  DO WE HAVE YOUR PERMISSION TO LEAVE A DETAILED MESSAGE:  OTHER COMMENTS:    **Let patient know to contact pharmacy at the end of the day to make sure medication is ready. **  ** Please notify patient to allow 48-72 hours to process**  **Encourage patient to contact the pharmacy for refills or they can request refills through [facility]**

## 2019-05-18 NOTE — Telephone Encounter (Signed)
Medication is not on pt med list, and he was seen yesterday so list is current also she does dot prescribe this medication.

## 2019-05-18 NOTE — Telephone Encounter (Signed)
He did say that this was a new request for this medicine and that he had not asked yesterday - not sure why?

## 2019-05-27 DIAGNOSIS — I10 Essential (primary) hypertension: Secondary | ICD-10-CM | POA: Diagnosis not present

## 2019-05-27 DIAGNOSIS — I4892 Unspecified atrial flutter: Secondary | ICD-10-CM | POA: Diagnosis not present

## 2019-05-27 DIAGNOSIS — E785 Hyperlipidemia, unspecified: Secondary | ICD-10-CM | POA: Diagnosis not present

## 2019-05-27 DIAGNOSIS — I480 Paroxysmal atrial fibrillation: Secondary | ICD-10-CM | POA: Diagnosis not present

## 2019-05-28 DIAGNOSIS — I4892 Unspecified atrial flutter: Secondary | ICD-10-CM | POA: Diagnosis not present

## 2019-06-03 ENCOUNTER — Ambulatory Visit (INDEPENDENT_AMBULATORY_CARE_PROVIDER_SITE_OTHER): Payer: BC Managed Care – PPO | Admitting: Family Medicine

## 2019-06-03 ENCOUNTER — Other Ambulatory Visit: Payer: Self-pay

## 2019-06-03 ENCOUNTER — Encounter: Payer: Self-pay | Admitting: Family Medicine

## 2019-06-03 VITALS — BP 112/75 | HR 73 | Temp 98.3°F | Ht 74.0 in | Wt 362.0 lb

## 2019-06-03 DIAGNOSIS — E1165 Type 2 diabetes mellitus with hyperglycemia: Secondary | ICD-10-CM | POA: Diagnosis not present

## 2019-06-03 DIAGNOSIS — I4891 Unspecified atrial fibrillation: Secondary | ICD-10-CM

## 2019-06-03 DIAGNOSIS — G4733 Obstructive sleep apnea (adult) (pediatric): Secondary | ICD-10-CM | POA: Diagnosis not present

## 2019-06-03 DIAGNOSIS — Z794 Long term (current) use of insulin: Secondary | ICD-10-CM

## 2019-06-03 DIAGNOSIS — M545 Low back pain, unspecified: Secondary | ICD-10-CM

## 2019-06-03 DIAGNOSIS — I10 Essential (primary) hypertension: Secondary | ICD-10-CM

## 2019-06-03 NOTE — Progress Notes (Addendum)
Subjective:  Patient ID: Phillip Frye, male    DOB: 09/04/1970  Age: 49 y.o. MRN: [id number]  CC:  Chief Complaint  Patient presents with  . Back Pain    pt states his back pain has improved and now pain comes and gos also pain isn't really intence when it dose hit per pt.  . Diabetes    pt hasn't had any issues with this conditions since last visit. pt states he checks his BS 3x daily ranging from as high as '138mg'$ /dl to as low as 70 mg/dl.  . Hypertension    pt states he hasn't had any issues with his BP at home. pt checks his BP 3x daily and pt stats its been running like todays reading of 112/75.    HPI [name] presents for   Hypertension: Last office in March.  Stable at that time. History of OSA, not on CPAP last visit, planned on having equipment fixed. Outstanding bill with DME equipment provider. Unable to get equipment for next 2 -3 weeks - plans on gettting payment taken care of in that time.   Followed by cardiology for atrial fibrillation, on rate control as well as anticoagulation with Eliquis.  ER visit March 23. cardiolgy - Dr. Doylene Canard. Wore heart monitor. Now on amiodarone in addition to metoprolol since 4/15.  still on Eliquis. afib has improved - not as fast. Next appt in 2 months with cardiology.   Home readings:112/75 at home. No new side effects.  BP Readings from Last 3 Encounters:  06/03/19 112/75  05/17/19 128/78  05/11/19 (!) 154/89   Lab Results  Component Value Date   CREATININE 1.21 05/04/2019   Diabetes  followed by endocrinology.  Appointment April 5, with recent significant improvement in control.   No change in meds. Lab Results  Component Value Date   HGBA1C 6.4 (H) 04/22/2019    Low back pain Discussed at March visit.  Referred to physical therapy, Tylenol as needed.  Handout on chronic back pain.  Phone number provided for medical weight loss management.  No evident arthropathy March 11 lumbar spine x-ray.  Incorporated some  additional exercises at last 3 PT visits.  No recent tylenol needed.  Back pain is better.    History Patient Active Problem List   Diagnosis Date Noted  . Type 2 diabetes mellitus without complication, without long-term current use of insulin ([place]) 02/10/2019  . Left anterior cruciate ligament tear 01/29/2019  . Acute medial meniscus tear of left knee 01/29/2019  . Acute lateral meniscus tear of left knee 01/29/2019  . 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 Type 2 diabetes mellitus with hyperglycemia, with long-term current use of insulin ([place]) 10/28/2018  . Dyslipidemia 10/28/2018  . Primary hypercoagulable state ([place]) 01/18/2018  . 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 Abnormal EKG 12/23/2012  . Obesity, Class III, BMI 40-49.9 (morbid obesity) ([place]) 12/11/2012  . SOB (shortness of breath) 09/22/2012  . Pulmonary embolism, bilateral ([place]) 09/22/2012  . Diabetes mellitus without complication ([place])   . DENTAL PAIN 03/31/2008  . CARPAL TUNNEL SYNDROME, BILATERAL 10/09/2007  . ULNAR NEUROPATHY 10/09/2007  . DENTAL CARIES 10/09/2007  . GERD 10/09/2007  . INGUINAL PAIN, BILATERAL 02/26/2007  . KNEE PAIN, RIGHT 12/19/2006  . FOOT PAIN, BILATERAL 12/19/2006  . HYPERTENSION 09/18/2006  . AVASCULAR NECROSIS 09/18/2006  . Sleep apnea 09/18/2006   Past Medical History:  Diagnosis Date  . Arthritis   . Atrial fibrillation ([place])   .  Diabetes mellitus without complication ([place])   . Dysrhythmia    unknown type  . GERD (gastroesophageal reflux disease)   . Hypertension    MD stopped BP med 1 yr ago due to normal BP  . Obesity   . Pulmonary embolism, bilateral ([place]) 09/2012   on xarelto  . Shortness of breath    with exertion  . Sleep apnea    uses c pap    Past Surgical History:  Procedure Laterality Date  . BREATH TEK H PYLORI N/A 12/28/2012   Procedure: BREATH TEK H PYLORI;  Speece: Gayland Curry, MD;  Location: [facility];  Service: General;  Laterality:  N/A;  . HIP SURGERY Bilateral    to "shave gthe bones"  . KNEE ARTHROSCOPY WITH ANTERIOR CRUCIATE LIGAMENT (ACL) REPAIR Left 01/29/2019   Procedure: KNEE ARTHROSCOPY WITH CHONDROPLASTY AND  ANTERIOR CRUCIATE LIGAMENT (ACL) RPAIR WITH [place];  Jock: Dorna Leitz, MD;  Location: [facility];  Service: Orthopedics;  Laterality: Left;  . LAPAROSCOPIC GASTRIC SLEEVE RESECTION N/A 05/31/2013   Procedure: LAPAROSCOPIC GASTRIC SLEEVE RESECTION;  Stamps: Gayland Curry, MD;  Location: [facility];  Service: General;  Laterality: N/A;   No Known Allergies Prior to Admission medications   Medication Sig Start Date End Date Taking? Authorizing Provider  amiodarone (PACERONE) 200 MG tablet Take 200 mg by mouth daily. 05/12/19  Yes [provider]  atorvastatin (LIPITOR) 10 MG tablet TAKE 1 TABLET (10 MG TOTAL) BY MOUTH DAILY AT 6 PM. 04/19/19  Yes Wendie Agreste, MD  blood glucose meter kit and supplies Dispense based on patient and insurance preference. Use up to four times daily as directed. (FOR ICD-10 E10.9, E11.9). 01/15/18  Yes Wendie Agreste, MD  Blood Glucose Monitoring Suppl (FREESTYLE LITE) DEVI  01/15/18  Yes [provider]  CINNAMON PO Take 500 mg by mouth 2 (two) times daily.   Yes [provider]  ELIQUIS 5 MG TABS tablet TAKE 1 TABLET BY MOUTH 2 TIMES DAILY FOR 30 DAYS 10/21/18  Yes Wendie Agreste, MD  furosemide (LASIX) 40 MG tablet Take 40 mg by mouth daily. 05/27/19  Yes [provider]  glucose blood test strip Up to 3 times per day - DM2 with hyperglycemia. Uncontrolled. 10/22/18  Yes Wendie Agreste, MD  KLOR-CON 10 10 MEQ tablet Take 10 mEq by mouth daily. 05/27/19  Yes [provider]  Lancets (FREESTYLE) lancets  01/15/18  Yes [provider]  losartan (COZAAR) 50 MG tablet Take 100 mg by mouth daily.    Yes [provider]  metFORMIN (GLUCOPHAGE) 1000 MG tablet Take 1 tablet (1,000 mg total) by mouth 2  (two) times daily with a meal. 05/17/19  Yes Shamleffer, Melanie Crazier, MD  metoprolol succinate (TOPROL-XL) 100 MG 24 hr tablet Take 100 mg by mouth daily. 05/14/19  Yes [provider]  Semaglutide,0.25 or 0.'5MG'$ /DOS, (OZEMPIC, 0.25 OR 0.5 MG/DOSE,) 2 MG/1.5ML SOPN Inject 0.5 mg into the skin once a week. 05/17/19  Yes Shamleffer, Melanie Crazier, MD  UNABLE TO FIND Med Name: [place] meter   Yes [provider]  amLODipine (NORVASC) 5 MG tablet Take 1 tablet (5 mg total) by mouth daily. 01/01/18 12/23/18  Wendie Agreste, MD  levocetirizine (XYZAL) 5 MG tablet Take 1 tablet (5 mg total) by mouth every evening. 08/23/17 01/26/19  Kennyth Arnold, FNP   Social History   Socioeconomic History  . Marital status: Single    Spouse name:  Not on file  . Number of children: 0  . Years of education: Not on file  . Highest education level: Not on file  Occupational History  . Not on file  Tobacco Use  . Smoking status: Current Every Day Smoker    Packs/day: 1.00    Years: 6.00    Pack years: 6.00    Types: Cigars  . Smokeless tobacco: Never Used  . Tobacco comment: 3 cigars a day  Substance and Sexual Activity  . Alcohol use: Yes    Comment: occasional  . Drug use: No  . Sexual activity: Not on file  Other Topics Concern  . Not on file  Social History Narrative   Lives alone.  Works at [employer].   Social Determinants of Health   Financial Resource Strain:   . Difficulty of Paying Living Expenses:   Food Insecurity:   . Worried About Charity fundraiser in the Last Year:   . Arboriculturist in the Last Year:   Transportation Needs:   . Film/video editor (Medical):   Marland Kitchen Lack of Transportation (Non-Medical):   Physical Activity:   . Days of Exercise per Week:   . Minutes of Exercise per Session:   Stress:   . Feeling of Stress :   Social Connections:   . Frequency of Communication with Friends and Family:   . Frequency of Social Gatherings with Friends and  Family:   . Attends Religious Services:   . Active Member of Clubs or Organizations:   . Attends Archivist Meetings:   Marland Kitchen Marital Status:   Intimate Partner Violence:   . Fear of Current or Ex-Partner:   . Emotionally Abused:   Marland Kitchen Physically Abused:   . Sexually Abused:     Review of Systems Per HPI. Denies new chest pain/dyspnea.   Objective:   Vitals:   06/03/19 0931  BP: 112/75  Pulse: 73  Temp: 98.3 F (36.8 C)  TempSrc: Temporal  SpO2: 98%  Weight: (!) 362 lb (164.2 kg)  Height: '6\' 2"'$  (1.88 m)     Physical Exam Vitals reviewed.  Constitutional:      Appearance: He is well-developed. He is obese.  HENT:     Head: Normocephalic and atraumatic.  Eyes:     Pupils: Pupils are equal, round, and reactive to light.  Neck:     Vascular: No carotid bruit or JVD.  Cardiovascular:     Rate and Rhythm: Normal rate and regular rhythm.     Heart sounds: Normal heart sounds. No murmur.  Pulmonary:     Effort: Pulmonary effort is normal.     Breath sounds: Normal breath sounds. No rales.  Musculoskeletal:     Lumbar back: Normal. No tenderness or bony tenderness. Normal range of motion.  Skin:    General: Skin is warm and dry.  Neurological:     Mental Status: He is alert and oriented to person, place, and time.        Assessment & Plan:  Perez Dirico Desilva is a 49 y.o. male . Midline low back pain without sciatica, unspecified chronicity  -Improving with physical therapy.  Symptomatic care discussed, RTC precautions.  Type 2 diabetes mellitus with hyperglycemia, with long-term current use of insulin (HCC)  -Well-controlled, followed by endocrinology.  Commended on his efforts with improved control.  Essential hypertension  -Stable, tolerating current regimen.  OSA (obstructive sleep apnea)  -Unfortunately cost prohibitive with ostomy failure with durable  medical equipment provider.  Does have a plan to have this taken care of in the next 2 to 3 weeks.   Unfortunately was unable to do partial payment or payment plan.  Treatment of sleep apnea may also have benefit with A. Fib.  Atrial fibrillation, unspecified type ([place])  -Now on amiodarone, has close follow-up with cardiology.  Less frequent symptoms.  Sinus rhythm based on exam in office today.  Continue cardiology follow-up  No orders of the defined types were placed in this encounter.  Patient Instructions   I'm glad to hear that the back pain is improving. Continue physical therapy. Tylenol if needed.  Return to the clinic or go to the nearest emergency room if any of your symptoms worsen or new symptoms occur.   Kepp follow up with other specialists as planned. Once you are able to restart CPAP, I think that will help in other areas as well. Thanks for coming in today and take care.    If you have lab work done today you will be contacted with your lab results within the next 2 weeks.  If you have not heard from [place] then please contact us. The fastest way to get your results is to register for My Chart.   IF you received an x-ray today, you will receive an invoice from [facility]. Please contact [facility] at [phone] with questions or concerns regarding your invoice.   IF you received labwork today, you will receive an invoice from [facility]. Please contact [facility] at [phone] with questions or concerns regarding your invoice.   Our billing staff will not be able to assist you with questions regarding bills from these companies.  You will be contacted with the lab results as soon as they are available. The fastest way to get your results is to activate your My Chart account. Instructions are located on the last page of this paperwork. If you have not heard from [place] regarding the results in 2 weeks, please contact this office.         Signed, [name], MD [facility]

## 2019-06-03 NOTE — Patient Instructions (Addendum)
I'm glad to hear that the back pain is improving. Continue physical therapy. Tylenol if needed.  Return to the clinic or go to the nearest emergency room if any of your symptoms worsen or new symptoms occur.   Kepp follow up with other specialists as planned. Once you are able to restart CPAP, I think that will help in other areas as well. Thanks for coming in today and take care.    If you have lab work done today you will be contacted with your lab results within the next 2 weeks.  If you have not heard from [place] then please contact us. The fastest way to get your results is to register for My Chart.   IF you received an x-ray today, you will receive an invoice from [facility]. Please contact [facility] at [phone] with questions or concerns regarding your invoice.   IF you received labwork today, you will receive an invoice from [facility]. Please contact [facility] at [phone] with questions or concerns regarding your invoice.   Our billing staff will not be able to assist you with questions regarding bills from these companies.  You will be contacted with the lab results as soon as they are available. The fastest way to get your results is to activate your My Chart account. Instructions are located on the last page of this paperwork. If you have not heard from [place] regarding the results in 2 weeks, please contact this office.

## 2019-06-07 ENCOUNTER — Ambulatory Visit (INDEPENDENT_AMBULATORY_CARE_PROVIDER_SITE_OTHER): Payer: BC Managed Care – PPO

## 2019-06-07 ENCOUNTER — Encounter: Payer: Self-pay | Admitting: Podiatry

## 2019-06-07 ENCOUNTER — Other Ambulatory Visit: Payer: Self-pay

## 2019-06-07 ENCOUNTER — Ambulatory Visit (INDEPENDENT_AMBULATORY_CARE_PROVIDER_SITE_OTHER): Payer: BC Managed Care – PPO | Admitting: Podiatry

## 2019-06-07 ENCOUNTER — Other Ambulatory Visit: Payer: Self-pay | Admitting: Podiatry

## 2019-06-07 DIAGNOSIS — M778 Other enthesopathies, not elsewhere classified: Secondary | ICD-10-CM

## 2019-06-07 DIAGNOSIS — M7662 Achilles tendinitis, left leg: Secondary | ICD-10-CM | POA: Diagnosis not present

## 2019-06-07 DIAGNOSIS — M7660 Achilles tendinitis, unspecified leg: Secondary | ICD-10-CM

## 2019-06-07 DIAGNOSIS — M779 Enthesopathy, unspecified: Secondary | ICD-10-CM

## 2019-06-10 NOTE — Progress Notes (Signed)
Subjective:   Patient ID: Phillip Frye, male   DOB: 49 y.o.   MRN: [id number]   HPI Patient states he developed a lot of pain in the back of his left heel and it is hard for him to walk and he does not remember specific injury   ROS      Objective:  Physical Exam  Neurovascular status intact with exquisite discomfort posterior aspect left heel lateral side of the center and medial side doing good with formation fluid at the insertion posterior calcaneus     Assessment:  Acute Achilles tendinitis left lateral side with pain     Plan:  H&P reviewed condition did sterile prep injected the lateral side 3 mg dexamethasone Kenalog 5 mg Xylocaine and explained before I did that chance for rupture associated with the injection of which he understood and is precautionary measure I did apply an air fracture walker to completely immobilize.  Reappoint 3 weeks or earlier if needed

## 2019-06-18 DIAGNOSIS — L280 Lichen simplex chronicus: Secondary | ICD-10-CM | POA: Diagnosis not present

## 2019-06-18 DIAGNOSIS — L409 Psoriasis, unspecified: Secondary | ICD-10-CM | POA: Diagnosis not present

## 2019-06-18 DIAGNOSIS — D485 Neoplasm of uncertain behavior of skin: Secondary | ICD-10-CM | POA: Diagnosis not present

## 2019-06-25 DIAGNOSIS — L400 Psoriasis vulgaris: Secondary | ICD-10-CM | POA: Diagnosis not present

## 2019-06-25 DIAGNOSIS — Z79899 Other long term (current) drug therapy: Secondary | ICD-10-CM | POA: Diagnosis not present

## 2019-06-28 ENCOUNTER — Ambulatory Visit: Payer: Medicare Other | Admitting: Podiatry

## 2019-07-05 ENCOUNTER — Encounter: Payer: Self-pay | Admitting: Podiatry

## 2019-07-05 ENCOUNTER — Other Ambulatory Visit: Payer: Self-pay

## 2019-07-05 ENCOUNTER — Ambulatory Visit (INDEPENDENT_AMBULATORY_CARE_PROVIDER_SITE_OTHER): Payer: BC Managed Care – PPO | Admitting: Podiatry

## 2019-07-05 VITALS — Temp 98.0°F

## 2019-07-05 DIAGNOSIS — M7662 Achilles tendinitis, left leg: Secondary | ICD-10-CM | POA: Diagnosis not present

## 2019-07-06 DIAGNOSIS — L400 Psoriasis vulgaris: Secondary | ICD-10-CM | POA: Diagnosis not present

## 2019-07-06 DIAGNOSIS — Z79899 Other long term (current) drug therapy: Secondary | ICD-10-CM | POA: Diagnosis not present

## 2019-07-07 NOTE — Progress Notes (Signed)
Subjective:   Patient ID: Phillip Frye, male   DOB: 49 y.o.   MRN: [id number]   HPI Patient states he still having a lot of pain in the back of his left heel and he did have an ACL injury last year and that seems to be when the pain become more extensive   ROS      Objective:  Physical Exam  Neurovascular status intact with patient found to have inflammation pain of the posterior heel left of the Achilles tendon as it inserts into the back of the heel bone with increased intensity of discomfort after the injury and immobilization which has been somewhat successful     Assessment:  Inflammatory Achilles tendinitis left that improved with aggressive conservative treatment but remains persistent     Plan:  H&P reviewed condition and I recommended physical therapy to be done and we will refer to physical therapist for intensive posterior Achilles tendon treatment.  Advised ultimate surgery but were trying to hold off with obesity a certain complicating factor for this patient

## 2019-07-09 ENCOUNTER — Telehealth: Payer: Self-pay | Admitting: *Deleted

## 2019-07-09 NOTE — Telephone Encounter (Signed)
I informed pt the PT orders were in the computer, but the actual form had not been taken to the [facility] upstairs. I told pt I would do that now. Orders delivered to [facility].

## 2019-07-09 NOTE — Telephone Encounter (Signed)
Pt called for status of PT.

## 2019-07-15 DIAGNOSIS — M25572 Pain in left ankle and joints of left foot: Secondary | ICD-10-CM | POA: Diagnosis not present

## 2019-07-15 DIAGNOSIS — M25672 Stiffness of left ankle, not elsewhere classified: Secondary | ICD-10-CM | POA: Diagnosis not present

## 2019-07-15 DIAGNOSIS — M6281 Muscle weakness (generalized): Secondary | ICD-10-CM | POA: Diagnosis not present

## 2019-07-15 DIAGNOSIS — M7662 Achilles tendinitis, left leg: Secondary | ICD-10-CM | POA: Diagnosis not present

## 2019-07-16 DIAGNOSIS — M7662 Achilles tendinitis, left leg: Secondary | ICD-10-CM | POA: Diagnosis not present

## 2019-07-16 DIAGNOSIS — M25572 Pain in left ankle and joints of left foot: Secondary | ICD-10-CM | POA: Diagnosis not present

## 2019-07-16 DIAGNOSIS — M6281 Muscle weakness (generalized): Secondary | ICD-10-CM | POA: Diagnosis not present

## 2019-07-16 DIAGNOSIS — M25672 Stiffness of left ankle, not elsewhere classified: Secondary | ICD-10-CM | POA: Diagnosis not present

## 2019-07-23 ENCOUNTER — Ambulatory Visit (LOCAL_COMMUNITY_HEALTH_CENTER): Payer: Self-pay

## 2019-07-23 ENCOUNTER — Other Ambulatory Visit: Payer: Self-pay

## 2019-07-23 DIAGNOSIS — Z111 Encounter for screening for respiratory tuberculosis: Secondary | ICD-10-CM

## 2019-07-26 ENCOUNTER — Other Ambulatory Visit: Payer: Self-pay

## 2019-07-26 ENCOUNTER — Ambulatory Visit (LOCAL_COMMUNITY_HEALTH_CENTER): Payer: Self-pay

## 2019-07-26 ENCOUNTER — Telehealth: Payer: Self-pay

## 2019-07-26 DIAGNOSIS — Z111 Encounter for screening for respiratory tuberculosis: Secondary | ICD-10-CM

## 2019-07-26 LAB — TB SKIN TEST
Induration: 0 mm
TB Skin Test: NEGATIVE

## 2019-07-26 NOTE — Telephone Encounter (Signed)
[facility] for PPDR as scheduled this am. Phone call to client and per male, she will text him in attempt to contact him to remind him of appt for TB skin test reading. Jossie Ng, RN

## 2019-09-02 ENCOUNTER — Other Ambulatory Visit (HOSPITAL_COMMUNITY): Payer: Self-pay | Admitting: Cardiology

## 2019-09-02 ENCOUNTER — Other Ambulatory Visit: Payer: Self-pay | Admitting: Cardiology

## 2019-09-02 DIAGNOSIS — I4892 Unspecified atrial flutter: Secondary | ICD-10-CM | POA: Diagnosis not present

## 2019-09-02 DIAGNOSIS — R0789 Other chest pain: Secondary | ICD-10-CM | POA: Diagnosis not present

## 2019-09-02 DIAGNOSIS — I480 Paroxysmal atrial fibrillation: Secondary | ICD-10-CM | POA: Diagnosis not present

## 2019-09-02 DIAGNOSIS — I10 Essential (primary) hypertension: Secondary | ICD-10-CM | POA: Diagnosis not present

## 2019-09-02 DIAGNOSIS — R0602 Shortness of breath: Secondary | ICD-10-CM

## 2019-09-06 DIAGNOSIS — M6281 Muscle weakness (generalized): Secondary | ICD-10-CM | POA: Diagnosis not present

## 2019-09-06 DIAGNOSIS — M25572 Pain in left ankle and joints of left foot: Secondary | ICD-10-CM | POA: Diagnosis not present

## 2019-09-06 DIAGNOSIS — M545 Low back pain: Secondary | ICD-10-CM | POA: Diagnosis not present

## 2019-09-06 DIAGNOSIS — M25672 Stiffness of left ankle, not elsewhere classified: Secondary | ICD-10-CM | POA: Diagnosis not present

## 2019-09-08 ENCOUNTER — Telehealth: Payer: Self-pay | Admitting: Podiatry

## 2019-09-08 ENCOUNTER — Other Ambulatory Visit: Payer: Self-pay

## 2019-09-08 ENCOUNTER — Encounter (HOSPITAL_COMMUNITY)
Admission: RE | Admit: 2019-09-08 | Discharge: 2019-09-08 | Disposition: A | Discharge status: Home / Self Care | Payer: 59 | Source: Ambulatory Visit | Attending: Cardiology | Admitting: Cardiology

## 2019-09-08 DIAGNOSIS — I209 Angina pectoris, unspecified: Secondary | ICD-10-CM | POA: Diagnosis not present

## 2019-09-08 DIAGNOSIS — M7662 Achilles tendinitis, left leg: Secondary | ICD-10-CM

## 2019-09-08 DIAGNOSIS — R0602 Shortness of breath: Secondary | ICD-10-CM | POA: Insufficient documentation

## 2019-09-08 DIAGNOSIS — I480 Paroxysmal atrial fibrillation: Secondary | ICD-10-CM | POA: Diagnosis not present

## 2019-09-08 DIAGNOSIS — I10 Essential (primary) hypertension: Secondary | ICD-10-CM | POA: Diagnosis not present

## 2019-09-08 DIAGNOSIS — E785 Hyperlipidemia, unspecified: Secondary | ICD-10-CM | POA: Diagnosis not present

## 2019-09-08 MED ORDER — TECHNETIUM TC 99M TETROFOSMIN IV KIT
32.9000 | PACK | Freq: Once | INTRAVENOUS | Status: AC | PRN
  Administered 2019-09-08: 32.9 via INTRAVENOUS

## 2019-09-08 MED ORDER — REGADENOSON 0.4 MG/5ML IV SOLN
0.4000 mg | Freq: Once | INTRAVENOUS | Status: AC
  Administered 2019-09-08: 0.4 mg via INTRAVENOUS

## 2019-09-08 MED ORDER — REGADENOSON 0.4 MG/5ML IV SOLN
INTRAVENOUS | Status: AC
  Filled 2019-09-08: qty 5

## 2019-09-08 NOTE — Telephone Encounter (Signed)
Please advise. Thanks Aleli Navedo 

## 2019-09-08 NOTE — Telephone Encounter (Signed)
[facility] called needing a updated referral

## 2019-09-08 NOTE — Telephone Encounter (Signed)
Whatever they want is fine

## 2019-09-09 ENCOUNTER — Encounter (HOSPITAL_COMMUNITY)
Admission: RE | Admit: 2019-09-09 | Discharge: 2019-09-09 | Disposition: A | Discharge status: Home / Self Care | Payer: 59 | Source: Ambulatory Visit | Attending: Cardiology | Admitting: Cardiology

## 2019-09-09 DIAGNOSIS — R002 Palpitations: Secondary | ICD-10-CM | POA: Diagnosis not present

## 2019-09-09 DIAGNOSIS — I480 Paroxysmal atrial fibrillation: Secondary | ICD-10-CM | POA: Diagnosis not present

## 2019-09-09 DIAGNOSIS — R000 Tachycardia, unspecified: Secondary | ICD-10-CM | POA: Diagnosis not present

## 2019-09-09 DIAGNOSIS — I4892 Unspecified atrial flutter: Secondary | ICD-10-CM | POA: Diagnosis not present

## 2019-09-09 DIAGNOSIS — R0789 Other chest pain: Secondary | ICD-10-CM | POA: Diagnosis not present

## 2019-09-09 DIAGNOSIS — R0602 Shortness of breath: Secondary | ICD-10-CM | POA: Diagnosis not present

## 2019-09-09 DIAGNOSIS — I10 Essential (primary) hypertension: Secondary | ICD-10-CM | POA: Diagnosis not present

## 2019-09-09 DIAGNOSIS — I5189 Other ill-defined heart diseases: Secondary | ICD-10-CM | POA: Diagnosis not present

## 2019-09-09 MED ORDER — TECHNETIUM TC 99M TETROFOSMIN IV KIT
31.0000 | PACK | Freq: Once | INTRAVENOUS | Status: AC | PRN
  Administered 2019-09-09: 31 via INTRAVENOUS

## 2019-09-09 NOTE — Addendum Note (Signed)
Addended by: Fritz Pickerel A on: 09/09/2019 08:31 AM   Modules accepted: Orders

## 2019-09-09 NOTE — Telephone Encounter (Signed)
Dr. Charlsie Merles and Joya San:  I went ahead and put a new order in for physical therapy today. Was hand delivered today to [facility].  Thank you, Hannah Beat, CMA (AAMA)

## 2019-09-13 ENCOUNTER — Ambulatory Visit: Payer: BC Managed Care – PPO | Admitting: Family Medicine

## 2019-09-14 ENCOUNTER — Other Ambulatory Visit (HOSPITAL_COMMUNITY)
Admission: RE | Admit: 2019-09-14 | Discharge: 2019-09-14 | Disposition: A | Discharge status: Home / Self Care | Payer: BC Managed Care – PPO | Source: Ambulatory Visit | Attending: Cardiology | Admitting: Cardiology

## 2019-09-14 DIAGNOSIS — Z20822 Contact with and (suspected) exposure to covid-19: Secondary | ICD-10-CM | POA: Insufficient documentation

## 2019-09-14 DIAGNOSIS — Z01812 Encounter for preprocedural laboratory examination: Secondary | ICD-10-CM | POA: Diagnosis not present

## 2019-09-14 LAB — SARS CORONAVIRUS 2 (TAT 6-24 HRS): SARS Coronavirus 2: NEGATIVE

## 2019-09-16 ENCOUNTER — Other Ambulatory Visit: Payer: Self-pay

## 2019-09-16 ENCOUNTER — Encounter (HOSPITAL_COMMUNITY): Payer: Self-pay | Admitting: Cardiology

## 2019-09-16 ENCOUNTER — Ambulatory Visit (HOSPITAL_COMMUNITY)
Admission: RE | Admit: 2019-09-16 | Discharge: 2019-09-16 | Disposition: A | Discharge status: Home / Self Care | Payer: 59 | Attending: Cardiology | Admitting: Cardiology

## 2019-09-16 ENCOUNTER — Encounter (HOSPITAL_COMMUNITY)
Admission: RE | Disposition: A | Discharge status: Home / Self Care | Payer: Self-pay | Source: Home / Self Care | Attending: Cardiology

## 2019-09-16 DIAGNOSIS — G4733 Obstructive sleep apnea (adult) (pediatric): Secondary | ICD-10-CM | POA: Insufficient documentation

## 2019-09-16 DIAGNOSIS — K219 Gastro-esophageal reflux disease without esophagitis: Secondary | ICD-10-CM | POA: Insufficient documentation

## 2019-09-16 DIAGNOSIS — E119 Type 2 diabetes mellitus without complications: Secondary | ICD-10-CM | POA: Insufficient documentation

## 2019-09-16 DIAGNOSIS — Z7984 Long term (current) use of oral hypoglycemic drugs: Secondary | ICD-10-CM | POA: Diagnosis not present

## 2019-09-16 DIAGNOSIS — I480 Paroxysmal atrial fibrillation: Secondary | ICD-10-CM | POA: Diagnosis not present

## 2019-09-16 DIAGNOSIS — I10 Essential (primary) hypertension: Secondary | ICD-10-CM | POA: Insufficient documentation

## 2019-09-16 DIAGNOSIS — Z7901 Long term (current) use of anticoagulants: Secondary | ICD-10-CM | POA: Insufficient documentation

## 2019-09-16 DIAGNOSIS — E785 Hyperlipidemia, unspecified: Secondary | ICD-10-CM | POA: Diagnosis not present

## 2019-09-16 DIAGNOSIS — I4892 Unspecified atrial flutter: Secondary | ICD-10-CM | POA: Insufficient documentation

## 2019-09-16 DIAGNOSIS — Z7982 Long term (current) use of aspirin: Secondary | ICD-10-CM | POA: Insufficient documentation

## 2019-09-16 DIAGNOSIS — Z6841 Body Mass Index (BMI) 40.0 and over, adult: Secondary | ICD-10-CM | POA: Diagnosis not present

## 2019-09-16 DIAGNOSIS — Z9884 Bariatric surgery status: Secondary | ICD-10-CM | POA: Insufficient documentation

## 2019-09-16 DIAGNOSIS — I209 Angina pectoris, unspecified: Secondary | ICD-10-CM | POA: Diagnosis not present

## 2019-09-16 DIAGNOSIS — Z79899 Other long term (current) drug therapy: Secondary | ICD-10-CM | POA: Diagnosis not present

## 2019-09-16 HISTORY — PX: LEFT HEART CATH AND CORONARY ANGIOGRAPHY: CATH118249

## 2019-09-16 LAB — BASIC METABOLIC PANEL
Anion gap: 8 (ref 5–15)
BUN: 11 mg/dL (ref 6–20)
CO2: 26 mmol/L (ref 22–32)
Calcium: 9 mg/dL (ref 8.9–10.3)
Chloride: 105 mmol/L (ref 98–111)
Creatinine, Ser: 1.06 mg/dL (ref 0.61–1.24)
GFR calc Af Amer: >60 mL/min (ref >60)
GFR calc non Af Amer: >60 mL/min (ref >60)
Glucose, Bld: 133 mg/dL — ABNORMAL HIGH (ref 70–99)
Potassium: 4.1 mmol/L (ref 3.5–5.1)
Sodium: 139 mmol/L (ref 135–145)

## 2019-09-16 LAB — CBC
HCT: 45.3 % (ref 39.0–52.0)
Hemoglobin: 14.9 g/dL (ref 13.0–17.0)
MCH: 30.8 pg (ref 26.0–34.0)
MCHC: 32.9 g/dL (ref 30.0–36.0)
MCV: 93.6 fL (ref 80.0–100.0)
Platelets: 266 10*3/uL (ref 150–400)
RBC: 4.84 MIL/uL (ref 4.22–5.81)
RDW: 12.2 % (ref 11.5–15.5)
WBC: 5.3 10*3/uL (ref 4.0–10.5)
nRBC: 0 % (ref 0.0–0.2)

## 2019-09-16 LAB — GLUCOSE, CAPILLARY
Glucose-Capillary: 146 mg/dL — ABNORMAL HIGH (ref 70–99)
Glucose-Capillary: 78 mg/dL (ref 70–99)

## 2019-09-16 SURGERY — LEFT HEART CATH AND CORONARY ANGIOGRAPHY
Anesthesia: LOCAL

## 2019-09-16 MED ORDER — HEPARIN (PORCINE) IN NACL 1000-0.9 UT/500ML-% IV SOLN
INTRAVENOUS | Status: DC | PRN
  Administered 2019-09-16 (×2): 500 mL

## 2019-09-16 MED ORDER — LIDOCAINE HCL (PF) 1 % IJ SOLN
INTRAMUSCULAR | Status: DC | PRN
  Administered 2019-09-16: 30 mL

## 2019-09-16 MED ORDER — ACETAMINOPHEN 325 MG PO TABS: 650.0000 mg | ORAL_TABLET | ORAL | Status: DC | PRN

## 2019-09-16 MED ORDER — MIDAZOLAM HCL 2 MG/2ML IJ SOLN
INTRAMUSCULAR | Status: AC
  Filled 2019-09-16: qty 2

## 2019-09-16 MED ORDER — FENTANYL CITRATE (PF) 100 MCG/2ML IJ SOLN
INTRAMUSCULAR | Status: DC | PRN
  Administered 2019-09-16: 25 ug via INTRAVENOUS

## 2019-09-16 MED ORDER — SODIUM CHLORIDE 0.9 % WEIGHT BASED INFUSION
3.0000 mL/kg/h | INTRAVENOUS | Status: AC
  Administered 2019-09-16: 3 mL/kg/h via INTRAVENOUS

## 2019-09-16 MED ORDER — SODIUM CHLORIDE 0.9 % IV SOLN
INTRAVENOUS | Status: AC | PRN
  Administered 2019-09-16: 250 mL via INTRAVENOUS

## 2019-09-16 MED ORDER — SODIUM CHLORIDE 0.9 % IV SOLN: 250.0000 mL | INTRAVENOUS | Status: DC | PRN

## 2019-09-16 MED ORDER — SODIUM CHLORIDE 0.9% FLUSH: 3.0000 mL | Freq: Two times a day (BID) | INTRAVENOUS | Status: DC

## 2019-09-16 MED ORDER — HEPARIN (PORCINE) IN NACL 1000-0.9 UT/500ML-% IV SOLN
INTRAVENOUS | Status: AC
  Filled 2019-09-16: qty 1000

## 2019-09-16 MED ORDER — IOHEXOL 350 MG/ML SOLN
INTRAVENOUS | Status: DC | PRN
  Administered 2019-09-16: 55 mL

## 2019-09-16 MED ORDER — ONDANSETRON HCL 4 MG/2ML IJ SOLN: 4.0000 mg | Freq: Four times a day (QID) | INTRAMUSCULAR | Status: DC | PRN

## 2019-09-16 MED ORDER — OXYCODONE HCL 5 MG PO TABS: 5.0000 mg | ORAL_TABLET | ORAL | Status: DC | PRN

## 2019-09-16 MED ORDER — SODIUM CHLORIDE 0.9 % WEIGHT BASED INFUSION: 1.0000 mL/kg/h | INTRAVENOUS | Status: DC

## 2019-09-16 MED ORDER — ASPIRIN 81 MG PO CHEW
81.0000 mg | CHEWABLE_TABLET | ORAL | Status: AC
  Administered 2019-09-16: 81 mg via ORAL
  Filled 2019-09-16: qty 1

## 2019-09-16 MED ORDER — FENTANYL CITRATE (PF) 100 MCG/2ML IJ SOLN
INTRAMUSCULAR | Status: AC
  Filled 2019-09-16: qty 2

## 2019-09-16 MED ORDER — SODIUM CHLORIDE 0.9 % IV SOLN: INTRAVENOUS | Status: AC

## 2019-09-16 MED ORDER — SODIUM CHLORIDE 0.9% FLUSH: 3.0000 mL | INTRAVENOUS | Status: DC | PRN

## 2019-09-16 MED ORDER — MIDAZOLAM HCL 2 MG/2ML IJ SOLN
INTRAMUSCULAR | Status: DC | PRN
  Administered 2019-09-16: 1 mg via INTRAVENOUS

## 2019-09-16 MED ORDER — LIDOCAINE HCL (PF) 1 % IJ SOLN
INTRAMUSCULAR | Status: AC
  Filled 2019-09-16: qty 30

## 2019-09-16 SURGICAL SUPPLY — 7 items
CATH INFINITI 5FR MULTPACK ANG (CATHETERS) ×1 IMPLANT
KIT HEART LEFT (KITS) ×2 IMPLANT
PACK CARDIAC CATHETERIZATION (CUSTOM PROCEDURE TRAY) ×2 IMPLANT
SHEATH PINNACLE 5F 10CM (SHEATH) ×1 IMPLANT
SYR MEDRAD MARK 7 150ML (SYRINGE) ×2 IMPLANT
TRANSDUCER W/STOPCOCK (MISCELLANEOUS) ×2 IMPLANT
WIRE EMERALD 3MM-J .035X150CM (WIRE) ×1 IMPLANT

## 2019-09-16 NOTE — Interval H&P Note (Signed)
Cath Lab Visit (complete for each Cath Lab visit)  Clinical Evaluation Leading to the Procedure:   ACS: No.  Non-ACS:    Anginal Classification: CCS III  Anti-ischemic medical therapy: Maximal Therapy (2 or more classes of medications)  Non-Invasive Test Results: Intermediate-risk stress test findings: cardiac mortality 1-3%/year  Prior CABG: No previous CABG      History and Physical Interval Note:  09/16/2019 9:03 AM  Phillip Frye  has presented today for surgery, with the diagnosis of abnormal stress test - chest pain.  The various methods of treatment have been discussed with the patient and family. After consideration of risks, benefits and other options for treatment, the patient has consented to  Procedure(s): LEFT HEART CATH AND CORONARY ANGIOGRAPHY (N/A) as a surgical intervention.  The patient's history has been reviewed, patient examined, no change in status, stable for surgery.  I have reviewed the patient's chart and labs.  Questions were answered to the patient's satisfaction.     Rinaldo Cloud

## 2019-09-16 NOTE — Progress Notes (Signed)
Site area: rt groin fa sheath Site Prior to Removal:  Level 0 Pressure Applied For: 20 minutes Manual:   yes Patient Status During Pull:  stable Post Pull Site:  Level 0 Post Pull Instructions Given:  yes Post Pull Pulses Present: rt dp palpable Dressing Applied:  Gauze and tegaderm Bedrest begins @ 1030 Comments:

## 2019-09-16 NOTE — Addendum Note (Signed)
Addended by: Hadley Pen R on: 09/16/2019 08:01 AM   Modules accepted: Orders

## 2019-09-16 NOTE — Discharge Instructions (Signed)
 DRINK PLENTY OF FLUIDS OVER THE NEXT 2-3 DAYS.Femoral Site Care This sheet gives you information about how to care for yourself after your procedure. Your health care provider may also give you more specific instructions. If you have problems or questions, contact your health care provider. What can I expect after the procedure? After the procedure, it is common to have:  Bruising that usually fades within 1-2 weeks.  Tenderness at the site. Follow these instructions at home: Wound care  Follow instructions from your health care provider about how to take care of your insertion site. Make sure you: ? Wash your hands with soap and water before you change your bandage (dressing). If soap and water are not available, use hand sanitizer. ? Change your dressing as told by your health care provider. ? Leave stitches (sutures), skin glue, or adhesive strips in place. These skin closures may need to stay in place for 2 weeks or longer. If adhesive strip edges start to loosen and curl up, you may trim the loose edges. Do not remove adhesive strips completely unless your health care provider tells you to do that.  Do not take baths, swim, or use a hot tub until your health care provider approves.  You may shower 24-48 hours after the procedure or as told by your health care provider. ? Gently wash the site with plain soap and water. ? Pat the area dry with a clean towel. ? Do not rub the site. This may cause bleeding.  Do not apply powder or lotion to the site. Keep the site clean and dry.  Check your femoral site every day for signs of infection. Check for: ? Redness, swelling, or pain. ? Fluid or blood. ? Warmth. ? Pus or a bad smell. Activity  For the first 2-3 days after your procedure, or as long as directed: ? Avoid climbing stairs as much as possible. ? Do not squat.  Do not lift anything that is heavier than 10 lb (4.5 kg), or the limit that you are told, until your health care  provider says that it is safe.  Rest as directed. ? Avoid sitting for a long time without moving. Get up to take short walks every 1-2 hours.  Do not drive for 24 hours if you were given a medicine to help you relax (sedative). General instructions  Take over-the-counter and prescription medicines only as told by your health care provider.  Keep all follow-up visits as told by your health care provider. This is important. Contact a health care provider if you have:  A fever or chills.  You have redness, swelling, or pain around your insertion site. Get help right away if:  The catheter insertion area swells very fast.  You pass out.  You suddenly start to sweat or your skin gets clammy.  The catheter insertion area is bleeding, and the bleeding does not stop when you hold steady pressure on the area.  The area near or just beyond the catheter insertion site becomes pale, cool, tingly, or numb. These symptoms may represent a serious problem that is an emergency. Do not wait to see if the symptoms will go away. Get medical help right away. Call your local emergency services (911 in the [place] Do not drive yourself to the hospital. Summary  After the procedure, it is common to have bruising that usually fades within 1-2 weeks.  Check your femoral site every day for signs of infection.  Do not lift anything that   is heavier than 10 lb (4.5 kg), or the limit that you are told, until your health care provider says that it is safe. This information is not intended to replace advice given to you by your health care provider. Make sure you discuss any questions you have with your health care provider. Document Revised: 02/10/2017 Document Reviewed: 02/10/2017 Elsevier Patient Education  2020 [employer].  

## 2019-09-16 NOTE — H&P (Signed)
Printed H&P in the chart needs to be scanned 

## 2019-09-27 ENCOUNTER — Encounter: Payer: Self-pay | Admitting: Internal Medicine

## 2019-09-27 ENCOUNTER — Ambulatory Visit (INDEPENDENT_AMBULATORY_CARE_PROVIDER_SITE_OTHER): Payer: 59 | Admitting: Internal Medicine

## 2019-09-27 ENCOUNTER — Other Ambulatory Visit: Payer: Self-pay

## 2019-09-27 VITALS — BP 100/60 | HR 75 | Ht 74.0 in | Wt 364.8 lb

## 2019-09-27 DIAGNOSIS — E119 Type 2 diabetes mellitus without complications: Secondary | ICD-10-CM

## 2019-09-27 LAB — POCT GLYCOSYLATED HEMOGLOBIN (HGB A1C): Hemoglobin A1C: 6.1 % — AB (ref 4.0–5.6)

## 2019-09-27 MED ORDER — OZEMPIC (0.25 OR 0.5 MG/DOSE) 2 MG/1.5ML ~~LOC~~ SOPN: 0.5000 mg | PEN_INJECTOR | SUBCUTANEOUS | 11 refills | Status: DC

## 2019-09-27 NOTE — Progress Notes (Signed)
Name: Phillip Frye  Age/ Sex: 49 y.o., male   MRN/ DOB: [id number], 12/12/1970     PCP: Wendie Agreste, MD   Reason for Endocrinology Evaluation: Type 2 Diabetes Mellitus  Initial Endocrine Consultative Visit: 10/28/2018    PATIENT IDENTIFIER: Phillip Frye is a 49 y.o. male with a past medical history of HTN, T2DM,A. Fib , OSA and PE. The patient has followed with Endocrinology clinic since 10/28/2018 for consultative assistance with management of his diabetes.  DIABETIC HISTORY:  Phillip Frye was diagnosed with DM in 2019. Has been on victoza and Januvia .Hemoglobin A1c has ranged from  10.5% in 2020, peaking at 12.8% in 2019.   ON his initial visit to our clinic his A1c was 10.5%. He was on Basaglar and Metformin. Ozempic was added. Basaglar was discontinued 11/2018 due to hypoglycemia.   Works a 3rd shift 8 PM to [employer]   Eats 3 pm, 10 pm and 1 AM   SUBJECTIVE:   During the last visit (05/17/2019):  A1c was 6.4% . We continued Metformin and Ozempic.   Today (09/27/2019): Phillip Frye is here for a 4 month follow up on diabetes. He checks his blood sugars 3 times daily.Since his last visit, he has been on worker's comp. And no insurance. He stopped taking ozempic and is back to insulin  He is having left leg pains  Pt had normal cardiac cath 09/16/2019     HOME DIABETES REGIMEN:  Ozempic 0.5 mg weekly - not taking  Metformin 1000 mg BID  Basaglar 40 units daily     METER DOWNLOAD SUMMARY: Unable to download  125- 260 mg/dL    HISTORY:  Past Medical History:  Past Medical History:  Diagnosis Date  . Arthritis   . Atrial fibrillation ([place])   . Diabetes mellitus without complication ([place])   . Dysrhythmia    unknown type  . GERD (gastroesophageal reflux disease)   . Hypertension    MD stopped BP med 1 yr ago due to normal BP  . Obesity   . Pulmonary embolism, bilateral ([place]) 09/2012   on xarelto  . Shortness of breath     with exertion  . Sleep apnea    uses c pap    Past Surgical History:  Past Surgical History:  Procedure Laterality Date  . BREATH TEK H PYLORI N/A 12/28/2012   Procedure: BREATH TEK H PYLORI;  [place]: Gayland Curry, MD;  Location: [facility];  Service: General;  Laterality: N/A;  . HIP SURGERY Bilateral    to "shave gthe bones"  . KNEE ARTHROSCOPY WITH ANTERIOR CRUCIATE LIGAMENT (ACL) REPAIR Left 01/29/2019   Procedure: KNEE ARTHROSCOPY WITH CHONDROPLASTY AND  ANTERIOR CRUCIATE LIGAMENT (ACL) RPAIR WITH [place];  [place]: Dorna Leitz, MD;  Location: [facility];  Service: Orthopedics;  Laterality: Left;  . LAPAROSCOPIC GASTRIC SLEEVE RESECTION N/A 05/31/2013   Procedure: LAPAROSCOPIC GASTRIC SLEEVE RESECTION;  [place]: Gayland Curry, MD;  Location: [facility];  Service: General;  Laterality: N/A;  . LEFT HEART CATH AND CORONARY ANGIOGRAPHY N/A 09/16/2019   Procedure: LEFT HEART CATH AND CORONARY ANGIOGRAPHY;  [place]: Charolette Forward, MD;  Location: [facility];  Service: Cardiovascular;  Laterality: N/A;    Social History:  reports that he has been smoking cigars. He has a 6.00 pack-year smoking history. He has never used smokeless tobacco. He reports current alcohol use. He reports that he does not use drugs.  Family History:  Family History  Problem Relation Age of Onset  . Cirrhosis Father   . Diabetes Other   . Diabetes Other   . Emphysema Paternal Grandfather        smoked     HOME MEDICATIONS: Allergies as of 09/27/2019   No Known Allergies     Medication List       Accurate as of September 27, 2019  8:07 AM. If you have any questions, ask your nurse or doctor.        STOP taking these medications   FreeStyle Lite Devi Stopped by: Dorita Sciara, MD     TAKE these medications   amiodarone 200 MG tablet Commonly known as: PACERONE Take 200 mg by mouth daily.   Aspirin 81 81 MG chewable tablet Generic drug: aspirin Chew 81 mg by mouth  daily. Take 1 tablet by mouth once daily, and may take one in the pm if check pain.   atorvastatin 10 MG tablet Commonly known as: LIPITOR TAKE 1 TABLET (10 MG TOTAL) BY MOUTH DAILY AT 6 PM.   Basaglar KwikPen 100 UNIT/ML Inject 40 Units into the skin daily.   blood glucose meter kit and supplies Dispense based on patient and insurance preference. Use up to four times daily as directed. (FOR ICD-10 E10.9, E11.9).   Eliquis 5 MG Tabs tablet Generic drug: apixaban TAKE 1 TABLET BY MOUTH 2 TIMES DAILY FOR 30 DAYS What changed: See the new instructions.   freestyle lancets   furosemide 40 MG tablet Commonly known as: LASIX Take 40 mg by mouth daily.   glucose blood test strip Up to 3 times per day - DM2 with hyperglycemia. Uncontrolled.   Klor-Con 10 10 MEQ tablet Generic drug: potassium chloride Take 10 mEq by mouth daily.   losartan 100 MG tablet Commonly known as: COZAAR Take 100 mg by mouth daily.   metFORMIN 1000 MG tablet Commonly known as: GLUCOPHAGE Take 1 tablet (1,000 mg total) by mouth 2 (two) times daily with a meal.   metoprolol succinate 100 MG 24 hr tablet Commonly known as: TOPROL-XL Take 100 mg by mouth daily.   Ozempic (0.25 or 0.5 MG/DOSE) 2 MG/1.5ML Sopn Generic drug: Semaglutide(0.25 or 0.5MG/DOS) Inject 0.5 mg into the skin once a week.   UNABLE TO FIND Med Name: [place] meter   vitamin B-12 500 MCG tablet Commonly known as: CYANOCOBALAMIN Take 500 mcg by mouth daily.   Vitamin D3 50 MCG (2000 UT) Tabs Take 2,000 mg by mouth 2 (two) times daily.        OBJECTIVE:   Vital Signs: BP 100/60 (BP Location: Left Arm, Patient Position: Sitting, Cuff Size: Normal)   Pulse 75   Ht _0  (1.88 m)   Wt (!) 364 lb 12.8 oz (165.5 kg)   SpO2 97%   BMI 46.84 kg/m   Wt Readings from Last 3 Encounters:  09/27/19 (!) 364 lb 12.8 oz (165.5 kg)  09/16/19 (!) 360 lb (163.3 kg)  06/03/19 (!) 362 lb (164.2 kg)     Exam: General: Pt appears well  and is in NAD  Lungs: Clear with good BS bilat with no rales, rhonchi, or wheezes  Heart: RRR with normal S1 and S2 and no gallops; no murmurs; no rub  Abdomen: Normoactive bowel sounds, soft, nontender, without masses or organomegaly palpable  Extremities: Left  pretibial edema.normal right LE  Neuro: MS is good with appropriate affect, pt is alert and Ox3   DM foot  exam: 05/17/2019  The skin of the feet is intact without sores or ulcerations. The pedal pulses are 2+ on right and 2+ on left. The sensation is decreased on the left  to a screening 5.07, 10 gram monofilament      DATA REVIEWED:  Lab Results  Component Value Date   HGBA1C 6.1 (A) 09/27/2019   HGBA1C 6.4 (H) 04/22/2019   HGBA1C 6.9 (A) 12/23/2018   Lab Results  Component Value Date   LDLCALC 62 04/22/2019   CREATININE 1.06 09/16/2019   Lab Results  Component Value Date   MICRALBCREAT 5 04/22/2019     Lab Results  Component Value Date   CHOL 114 04/22/2019   HDL 30 (L) 04/22/2019   LDLCALC 62 04/22/2019   TRIG 124 04/22/2019   CHOLHDL 3.8 04/22/2019         ASSESSMENT / PLAN / RECOMMENDATIONS:   1) Type 2 Diabetes Mellitus, Optimally controlled, Without complications - Most recent A1c of 6.1 %. Goal A1c < 7.0 %.    - His A1c remains optimal with metformin and Ozempic, he did not take it this past week due to change in insurance. He has new insurance and is planning on picking up the ozempic prescription this Friday, in the meantime he will continue to use basaglar 40 units ( has 2 boxes at home)  - we discussed pt assistance program and [facility] insulin options if needed     MEDICATIONS:  Restart  Ozempic 0.5 mg weekly   Continue Metformin 1000 mg BID  EDUCATION / INSTRUCTIONS:  BG monitoring instructions: Patient is instructed to check his blood sugars 2 times a day, fasting and bedtime.  Call [facility] if: BG persistently < 70 or > 180 . I reviewed the Rule of 15 for the  treatment of hypoglycemia in detail with the patient. Literature supplied.    F/U in 6 months    Signed electronically by: Mack Guise, MD  [facility]  [facility] [address] [place], [place] [address] Phone: [phone] FAX: [phone]   CC: Wendie Agreste, MD [address] [place] [address] Phone: [phone]  Fax: [phone]  Return to Endocrinology clinic as below: Future Appointments  Date Time Provider [place]  09/29/2019  2:20 PM Wendie Agreste, MD PCP-PCP [facility]

## 2019-09-27 NOTE — Patient Instructions (Signed)
-   Ozempic 0.5 mg weekly - Metformin 1000  Twice a day with meals      HOW TO TREAT LOW BLOOD SUGARS (Blood sugar LESS THAN 70 MG/DL)  Please follow the RULE OF 15 for the treatment of hypoglycemia treatment (when your (blood sugars are less than 70 mg/dL)    STEP 1: Take 15 grams of carbohydrates when your blood sugar is low, which includes:   3-4 GLUCOSE TABS  OR  3-4 OZ OF JUICE OR REGULAR SODA OR  ONE TUBE OF GLUCOSE GEL     STEP 2: RECHECK blood sugar in 15 MINUTES STEP 3: If your blood sugar is still low at the 15 minute recheck --> then, go back to STEP 1 and treat AGAIN with another 15 grams of carbohydrates.

## 2019-09-29 ENCOUNTER — Ambulatory Visit: Payer: BC Managed Care – PPO | Admitting: Family Medicine

## 2019-09-30 ENCOUNTER — Encounter: Payer: Self-pay | Admitting: Family Medicine

## 2019-11-15 ENCOUNTER — Other Ambulatory Visit: Payer: Self-pay

## 2019-11-15 ENCOUNTER — Ambulatory Visit (INDEPENDENT_AMBULATORY_CARE_PROVIDER_SITE_OTHER): Payer: 59 | Admitting: Podiatry

## 2019-11-15 DIAGNOSIS — M7732 Calcaneal spur, left foot: Secondary | ICD-10-CM

## 2019-11-15 DIAGNOSIS — M7662 Achilles tendinitis, left leg: Secondary | ICD-10-CM

## 2019-11-16 ENCOUNTER — Telehealth: Payer: Self-pay

## 2019-11-16 ENCOUNTER — Encounter: Payer: Self-pay | Admitting: Podiatry

## 2019-11-16 NOTE — Telephone Encounter (Signed)
Received cardiac clearance from Dr. Sharyn Lull. He stated the "patient is acceptable risk for retrocalcaneal exostectomy with repair of achilles tendon, left foot from cardiac point of view. Okay to stop Eliquis 2 days prior to the surgery and restart as appropriate."   I spoke to [place] and relayed this information to him, he stated he understood.

## 2019-11-16 NOTE — Progress Notes (Signed)
   HPI: 49 y.o. male presenting today for follow-up evaluation of left posterior heel pain.  The patient has been treated conservatively for several months under the care of Dr. Charlsie Merles here in our practice.  He continues to have pain.  He states the cam boot does provide some minimal relief.  He is also gone to physical therapy which was not helpful.  He is unable to put pressure on the foot because of the significant posterior heel pain.  He presents for further treatment evaluation  Past Medical History:  Diagnosis Date  . Arthritis   . Atrial fibrillation ([facility])   . Diabetes mellitus without complication ([facility])   . Dysrhythmia    unknown type  . GERD (gastroesophageal reflux disease)   . Hypertension    MD stopped BP med 1 yr ago due to normal BP  . Obesity   . Pulmonary embolism, bilateral ([facility]) 09/2012   on xarelto  . Shortness of breath    with exertion  . Sleep apnea    uses c pap      Physical Exam: General: The patient is alert and oriented x3 in no acute distress.  Dermatology: Skin is warm, dry and supple bilateral lower extremities. Negative for open lesions or macerations.  Vascular: Palpable pedal pulses bilaterally. No edema or erythema noted. Capillary refill within normal limits.  Neurological: Epicritic and protective threshold grossly intact bilaterally.   Musculoskeletal Exam: Range of motion within normal limits to all pedal and ankle joints bilateral. Muscle strength 5/5 in all groups bilateral.  There continues to be tenderness with light touch and palpation to the posterior tubercle of the calcaneus and the insertion of the Achilles tendon left.  Assessment: 1.  Posterior heel spur left 2.  Chronic Achilles tendinitis, insertional, left   Plan of Care:  1. Patient evaluated. X-Rays reviewed.  2. Today we discussed the conservative versus surgical management of the presenting pathology. The patient opts for surgical management. All possible complications and  details of the procedure were explained. All patient questions were answered. No guarantees were expressed or implied. 3. Authorization for surgery was initiated today. Surgery will consist of retrocalcaneal exostectomy with repair of Achilles tendon and application of a fiberglass cast left 4.  Order placed for knee scooter today 5.  Return to clinic 1 week postop        Felecia Shelling, DPM [facility]  Dr. Felecia Shelling, DPM    [address] [place], [place] [address]                Office [phone]  Fax [phone]

## 2019-11-17 ENCOUNTER — Telehealth: Payer: Self-pay

## 2019-11-17 NOTE — Telephone Encounter (Signed)
DOS 12/09/2019  CALCANEAL OSTECTOMY LT - [id number] REPAIR ACHILLES LT - [id number]  RECEIVED AUTHORIZATION FAX FROM [facility]. NO AUTH IS REQUIRED FOR CPT [id number] & [id number]  REF # [id number]

## 2019-12-06 ENCOUNTER — Other Ambulatory Visit: Payer: Self-pay | Admitting: Podiatry

## 2019-12-06 DIAGNOSIS — M7732 Calcaneal spur, left foot: Secondary | ICD-10-CM

## 2019-12-06 DIAGNOSIS — M7662 Achilles tendinitis, left leg: Secondary | ICD-10-CM

## 2019-12-09 ENCOUNTER — Other Ambulatory Visit: Payer: Self-pay | Admitting: Podiatry

## 2019-12-09 DIAGNOSIS — M7732 Calcaneal spur, left foot: Secondary | ICD-10-CM | POA: Diagnosis not present

## 2019-12-09 DIAGNOSIS — M7662 Achilles tendinitis, left leg: Secondary | ICD-10-CM | POA: Diagnosis not present

## 2019-12-09 MED ORDER — OXYCODONE-ACETAMINOPHEN 5-325 MG PO TABS: 1.0000 | ORAL_TABLET | ORAL | 0 refills | Status: DC | PRN

## 2019-12-09 NOTE — Progress Notes (Signed)
PRN postop 

## 2019-12-15 ENCOUNTER — Ambulatory Visit (INDEPENDENT_AMBULATORY_CARE_PROVIDER_SITE_OTHER): Payer: 59 | Admitting: Podiatry

## 2019-12-15 ENCOUNTER — Other Ambulatory Visit: Payer: Self-pay

## 2019-12-15 DIAGNOSIS — M7732 Calcaneal spur, left foot: Secondary | ICD-10-CM

## 2019-12-15 DIAGNOSIS — Z9889 Other specified postprocedural states: Secondary | ICD-10-CM

## 2019-12-15 DIAGNOSIS — M7662 Achilles tendinitis, left leg: Secondary | ICD-10-CM

## 2019-12-15 MED ORDER — OXYCODONE-ACETAMINOPHEN 5-325 MG PO TABS: 1.0000 | ORAL_TABLET | Freq: Four times a day (QID) | ORAL | 0 refills | Status: DC | PRN

## 2019-12-15 NOTE — Progress Notes (Signed)
   Subjective:  Patient presents today status post retrocalcaneal exostectomy with repair of Achilles tendon left. DOS: 12/09/2019.  Patient states that he is having some pain around the ankle.  He has been nonweightbearing in the cast using the knee scooter.  Pain is tolerated with the pain medication.  No new complaints at this time  Past Medical History:  Diagnosis Date  . Arthritis   . Atrial fibrillation ([facility])   . Diabetes mellitus without complication ([facility])   . Dysrhythmia    unknown type  . GERD (gastroesophageal reflux disease)   . Hypertension    MD stopped BP med 1 yr ago due to normal BP  . Obesity   . Pulmonary embolism, bilateral ([facility]) 09/2012   on xarelto  . Shortness of breath    with exertion  . Sleep apnea    uses c pap       Objective/Physical Exam Neurovascular status intact.  Cast is left intact today.  Patient is able to move his toes.  Capillary refill within normal limits.  No strikethrough noted.  No malodor noted.  Assessment: 1. s/p retrocalcaneal exostectomy with repair of Achilles tendon left. DOS: 12/09/2019   Plan of Care:  1. Patient was evaluated.  X-ray machine was down today.  We will get x-rays in 2 weeks for cast removal 2.  Continue strict nonweightbearing in the cast x2 weeks.  To give the patient some alleviation and relief we did cut the cast on each side and bivalved and essentially decompressed the cast.  It was reinforced with Ace wraps. 3.  Refill prescription for Percocet 5/325 mg 4.  Return to clinic in 2 weeks for cast removal   Felecia Shelling, DPM [facility]  Dr. Felecia Shelling, DPM    [address] [place], [place] [address]                Office [phone]  Fax [phone]

## 2019-12-22 ENCOUNTER — Encounter: Payer: 59 | Admitting: Podiatry

## 2019-12-29 ENCOUNTER — Ambulatory Visit (INDEPENDENT_AMBULATORY_CARE_PROVIDER_SITE_OTHER): Payer: 59

## 2019-12-29 ENCOUNTER — Ambulatory Visit (INDEPENDENT_AMBULATORY_CARE_PROVIDER_SITE_OTHER): Payer: 59 | Admitting: Podiatry

## 2019-12-29 ENCOUNTER — Other Ambulatory Visit: Payer: Self-pay

## 2019-12-29 DIAGNOSIS — M7732 Calcaneal spur, left foot: Secondary | ICD-10-CM

## 2019-12-29 DIAGNOSIS — Z9889 Other specified postprocedural states: Secondary | ICD-10-CM

## 2019-12-29 DIAGNOSIS — M7662 Achilles tendinitis, left leg: Secondary | ICD-10-CM

## 2019-12-29 MED ORDER — OXYCODONE-ACETAMINOPHEN 5-325 MG PO TABS: 1.0000 | ORAL_TABLET | Freq: Four times a day (QID) | ORAL | 0 refills | Status: DC | PRN

## 2019-12-29 NOTE — Progress Notes (Signed)
   Subjective:  Patient presents today status post retrocalcaneal exostectomy with repair of Achilles tendon left. DOS: 12/09/2019.  Patient states that he is doing well.  He admits to putting some pressure on his foot.  He states that the cast is very tight.  He is requesting to have it removed today.  He presents today using his knee scooter.  No new complaints at this time  Past Medical History:  Diagnosis Date  . Arthritis   . Atrial fibrillation ([facility])   . Diabetes mellitus without complication ([facility])   . Dysrhythmia    unknown type  . GERD (gastroesophageal reflux disease)   . Hypertension    MD stopped BP med 1 yr ago due to normal BP  . Obesity   . Pulmonary embolism, bilateral ([facility]) 09/2012   on xarelto  . Shortness of breath    with exertion  . Sleep apnea    uses c pap       Objective/Physical Exam Neurovascular status intact.  Skin incisions appear to be well coapted with sutures intact. No sign of infectious process noted. No dehiscence. No active bleeding noted. Moderate edema noted to the posterior heel.  Radiographic Exam:  Osteotomies sites appear to be stable with routine healing.  Assessment: 1. s/p retrocalcaneal exostectomy with repair of Achilles tendon left. DOS: 12/09/2019   Plan of Care:  1. Patient was evaluated. X-rays reviewed 2.  Cast removed today. 3.  Dressel dressings applied. 4.  Cam boot dispensed with a heel lift.  Continue strict nonweightbearing using the knee scooter 5.  Refill prescription for Percocet 5/325 mg 6.  Return to clinic 2 weeks for suture removal   Felecia Shelling, DPM [facility]  Dr. Felecia Shelling, DPM    [address] [place], [place] [address]                Office [phone]  Fax [phone]

## 2020-01-05 ENCOUNTER — Encounter: Payer: 59 | Admitting: Podiatry

## 2020-01-12 ENCOUNTER — Ambulatory Visit (INDEPENDENT_AMBULATORY_CARE_PROVIDER_SITE_OTHER): Payer: 59 | Admitting: Podiatry

## 2020-01-12 ENCOUNTER — Other Ambulatory Visit: Payer: Self-pay

## 2020-01-12 DIAGNOSIS — Z9889 Other specified postprocedural states: Secondary | ICD-10-CM

## 2020-01-12 DIAGNOSIS — M7732 Calcaneal spur, left foot: Secondary | ICD-10-CM

## 2020-01-12 DIAGNOSIS — M7662 Achilles tendinitis, left leg: Secondary | ICD-10-CM

## 2020-01-12 NOTE — Progress Notes (Signed)
   Subjective:  Patient presents today status post retrocalcaneal exostectomy with repair of Achilles tendon left. DOS: 12/09/2019.  Patient is doing well.  He admits to putting pressure on his foot against advice.  He states that he has been wearing the cam boot as instructed.  No new complaints this time  Past Medical History:  Diagnosis Date  . Arthritis   . Atrial fibrillation ([facility])   . Diabetes mellitus without complication ([facility])   . Dysrhythmia    unknown type  . GERD (gastroesophageal reflux disease)   . Hypertension    MD stopped BP med 1 yr ago due to normal BP  . Obesity   . Pulmonary embolism, bilateral ([facility]) 09/2012   on xarelto  . Shortness of breath    with exertion  . Sleep apnea    uses c pap       Objective/Physical Exam Neurovascular status intact.  Skin incisions appear to be well coapted with sutures intact. No sign of infectious process noted. No dehiscence. No active bleeding noted. Moderate edema noted to the posterior heel.  Assessment: 1. s/p retrocalcaneal exostectomy with repair of Achilles tendon left. DOS: 12/09/2019   Plan of Care:  1. Patient was evaluated.  2.  Sutures removed today. 3.  Light dressing applied along the incision site.  Recommend Betadine ointment and an Ace wrap daily.  Betadine ointment and a sharps provided 4.  Continue nonweightbearing in the cam boot using the knee scooter 5.  Return to clinic in 2 weeks   Felecia Shelling, DPM [facility]  Dr. Felecia Shelling, DPM    [address] [place], [place] [address]                Office [phone]  Fax [phone]

## 2020-01-26 ENCOUNTER — Ambulatory Visit (INDEPENDENT_AMBULATORY_CARE_PROVIDER_SITE_OTHER): Payer: Self-pay

## 2020-01-26 ENCOUNTER — Other Ambulatory Visit: Payer: Self-pay

## 2020-01-26 ENCOUNTER — Ambulatory Visit (INDEPENDENT_AMBULATORY_CARE_PROVIDER_SITE_OTHER): Payer: Self-pay | Admitting: Podiatry

## 2020-01-26 DIAGNOSIS — M7732 Calcaneal spur, left foot: Secondary | ICD-10-CM

## 2020-01-26 DIAGNOSIS — Z9889 Other specified postprocedural states: Secondary | ICD-10-CM

## 2020-01-26 DIAGNOSIS — M7730 Calcaneal spur, unspecified foot: Secondary | ICD-10-CM | POA: Insufficient documentation

## 2020-01-26 DIAGNOSIS — M7662 Achilles tendinitis, left leg: Secondary | ICD-10-CM

## 2020-01-26 DIAGNOSIS — I4891 Unspecified atrial fibrillation: Secondary | ICD-10-CM | POA: Insufficient documentation

## 2020-01-26 NOTE — Progress Notes (Signed)
   Subjective:  Patient presents today status post retrocalcaneal exostectomy with repair of Achilles tendon left. DOS: 12/09/2019.  Patient is doing well. He has been weightbearing in the cam boot. No new complaints at this time  Past Medical History:  Diagnosis Date  . Arthritis   . Atrial fibrillation ([facility])   . Diabetes mellitus without complication ([facility])   . Dysrhythmia    unknown type  . GERD (gastroesophageal reflux disease)   . Hypertension    MD stopped BP med 1 yr ago due to normal BP  . Obesity   . Pulmonary embolism, bilateral ([facility]) 09/2012   on xarelto  . Shortness of breath    with exertion  . Sleep apnea    uses c pap       Objective/Physical Exam Neurovascular status intact.  Skin incisions appear to be well coapted and healed. There are 2 very superficial areas along the incision site that are still healing. Again these are very superficial limited to the breakdown of the skin. No sign of infectious process noted. No dehiscence. No active bleeding noted. Moderate edema noted to the posterior heel.  Assessment: 1. s/p retrocalcaneal exostectomy with repair of Achilles tendon left. DOS: 12/09/2019   Plan of Care:  1. Patient was evaluated.  2. Overall patient is doing very well. Patient may be now full weightbearing in the cam boot 3. Continue Ace wrap daily 4. 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  Felecia Shelling, DPM [facility]  Dr. Felecia Shelling, DPM    [address] [place], [place] [address]                Office [phone]  Fax [phone]

## 2020-02-21 ENCOUNTER — Ambulatory Visit (INDEPENDENT_AMBULATORY_CARE_PROVIDER_SITE_OTHER): Payer: Self-pay | Admitting: Podiatry

## 2020-02-21 ENCOUNTER — Other Ambulatory Visit: Payer: Self-pay

## 2020-02-21 DIAGNOSIS — Z9889 Other specified postprocedural states: Secondary | ICD-10-CM

## 2020-02-21 DIAGNOSIS — M7662 Achilles tendinitis, left leg: Secondary | ICD-10-CM

## 2020-02-21 NOTE — Progress Notes (Signed)
   Subjective:  Patient presents today status post retrocalcaneal exostectomy with repair of Achilles tendon left. DOS: 12/09/2019. Patient is doing very well. He is ready to transition out of the cam boot and to begin working. No new complaints at this time  Past Medical History:  Diagnosis Date  . Arthritis   . Atrial fibrillation ([facility])   . Diabetes mellitus without complication (H[facility])   . Dysrhythmia    unknown type  . GERD (gastroesophageal reflux disease)   . Hypertension    MD stopped BP med 1 yr ago due to normal BP  . Obesity   . Pulmonary embolism, bilateral ([facility]) 09/2012   on xarelto  . Shortness of breath    with exertion  . Sleep apnea    uses c pap    Objective: Physical Exam General: The patient is alert and oriented x3 in no acute distress.  Dermatology: Skin is cool, dry and supple bilateral lower extremities. Negative for open lesions or macerations.  Vascular: Palpable pedal pulses bilaterally. No edema or erythema noted. Capillary refill within normal limits.  Neurological: Epicritic and protective threshold grossly intact bilaterally.   Musculoskeletal Exam: All pedal and ankle joints range of motion within normal limits bilateral. Muscle strength 5/5 in all groups bilateral. Negative for any pain or tenderness to the Achilles tendon area.   Assessment: 1. s/p retrocalcaneal exostectomy with repair of Achilles tendon left. DOS: 12/09/2019   Plan of Care:  1. Patient was evaluated.  2. Patient is ready to transition out of the cam boot and good supportive tennis shoes. 3. Recommend [employer] for good supportive shoes 4. Patient may slowly resume full activity no restrictions  5. 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  Felecia Shelling, DPM [facility]  Dr. Felecia Shelling, DPM    [address] [place], [place] [address]                 Office [phone]  Fax [phone]

## 2020-02-21 NOTE — Patient Instructions (Signed)
[employer] off of [address] here in [place]

## 2020-03-05 ENCOUNTER — Other Ambulatory Visit: Payer: Self-pay | Admitting: Internal Medicine

## 2020-03-27 ENCOUNTER — Other Ambulatory Visit: Payer: Self-pay

## 2020-03-27 ENCOUNTER — Ambulatory Visit (INDEPENDENT_AMBULATORY_CARE_PROVIDER_SITE_OTHER): Payer: 59 | Admitting: Podiatry

## 2020-03-27 ENCOUNTER — Ambulatory Visit (INDEPENDENT_AMBULATORY_CARE_PROVIDER_SITE_OTHER): Payer: 59

## 2020-03-27 DIAGNOSIS — M7662 Achilles tendinitis, left leg: Secondary | ICD-10-CM | POA: Diagnosis not present

## 2020-03-27 DIAGNOSIS — M7732 Calcaneal spur, left foot: Secondary | ICD-10-CM | POA: Diagnosis not present

## 2020-03-27 MED ORDER — GABAPENTIN 100 MG PO CAPS: 100.0000 mg | ORAL_CAPSULE | Freq: Three times a day (TID) | ORAL | 1 refills | Status: DC

## 2020-03-27 NOTE — Progress Notes (Signed)
   Subjective:  Patient presents today status post retrocalcaneal exostectomy with repair of Achilles tendon left. DOS: 12/09/2019.  Patient states that over the last few weeks he has had some intermittent sharp shooting pains to the posterior heel.  He has noticed it with increased activity.  Intermittent throughout the day.  He has been wearing good supportive shoes and working.  He has no deep achy pains  Past Medical History:  Diagnosis Date  . Arthritis   . Atrial fibrillation ([facility])   . Diabetes mellitus without complication ([facility])   . Dysrhythmia    unknown type  . GERD (gastroesophageal reflux disease)   . Hypertension    MD stopped BP med 1 yr ago due to normal BP  . Obesity   . Pulmonary embolism, bilateral ([facility]) 09/2012   on xarelto  . Shortness of breath    with exertion  . Sleep apnea    uses c pap    Objective: Physical Exam General: The patient is alert and oriented x3 in no acute distress.  Dermatology: Skin is cool, dry and supple bilateral lower extremities. Negative for open lesions or macerations.  Vascular: Palpable pedal pulses bilaterally. No edema or erythema noted. Capillary refill within normal limits.  Neurological: Epicritic and protective threshold grossly intact bilaterally.   Musculoskeletal Exam: All pedal and ankle joints range of motion within normal limits bilateral. Muscle strength 5/5 in all groups bilateral. Negative for any pain or tenderness to the Achilles tendon area.   Assessment: 1. s/p retrocalcaneal exostectomy with repair of Achilles tendon left. DOS: 12/09/2019   Plan of Care:  1. Patient was evaluated.  2.  Prescription for gabapentin 100 mg 3 times daily #90 1 refill 3.  Continue wearing good supportive tennis shoes 4.  Continue to do stretching exercises 5.  Return to clinic as needed  *Drives a forklift for work  Felecia Shelling, DPM [facility]  Dr. Felecia Shelling, DPM    [address] [place], [place] [address]                Office [phone]  Fax [phone]

## 2020-04-03 ENCOUNTER — Ambulatory Visit (INDEPENDENT_AMBULATORY_CARE_PROVIDER_SITE_OTHER): Payer: Self-pay | Admitting: Internal Medicine

## 2020-04-03 ENCOUNTER — Other Ambulatory Visit: Payer: Self-pay

## 2020-04-03 ENCOUNTER — Encounter: Payer: Self-pay | Admitting: Internal Medicine

## 2020-04-03 VITALS — BP 140/76 | HR 80 | Ht 74.0 in | Wt 385.2 lb

## 2020-04-03 DIAGNOSIS — Z794 Long term (current) use of insulin: Secondary | ICD-10-CM

## 2020-04-03 DIAGNOSIS — E1165 Type 2 diabetes mellitus with hyperglycemia: Secondary | ICD-10-CM

## 2020-04-03 LAB — POCT GLYCOSYLATED HEMOGLOBIN (HGB A1C): Hemoglobin A1C: 7.5 % — AB (ref 4.0–5.6)

## 2020-04-03 LAB — POCT GLUCOSE (DEVICE FOR HOME USE): POC Glucose: 207 mg/dl — AB (ref 70–99)

## 2020-04-03 MED ORDER — OZEMPIC (1 MG/DOSE) 4 MG/3ML ~~LOC~~ SOPN: 1.0000 mg | PEN_INJECTOR | SUBCUTANEOUS | 11 refills | Status: DC

## 2020-04-03 NOTE — Patient Instructions (Signed)
-  Increase  Ozempic to 1 mg weekly -Continue  Metformin 1000  Twice a day with meals       HOW TO TREAT LOW BLOOD SUGARS (Blood sugar LESS THAN 70 MG/DL)  Please follow the RULE OF 15 for the treatment of hypoglycemia treatment (when your (blood sugars are less than 70 mg/dL)    STEP 1: Take 15 grams of carbohydrates when your blood sugar is low, which includes:   3-4 GLUCOSE TABS  OR  3-4 OZ OF JUICE OR REGULAR SODA OR  ONE TUBE OF GLUCOSE GEL     STEP 2: RECHECK blood sugar in 15 MINUTES STEP 3: If your blood sugar is still low at the 15 minute recheck --> then, go back to STEP 1 and treat AGAIN with another 15 grams of carbohydrates.

## 2020-04-03 NOTE — Progress Notes (Signed)
Name: Phillip Frye  Age/ Sex: 50 y.o., male   MRN/ DOB: [id number], [birth year]     PCP: Wendie Agreste, MD   Reason for Endocrinology Evaluation: Type 2 Diabetes Mellitus  Initial Endocrine Consultative Visit: 10/28/2018    PATIENT IDENTIFIER: Phillip Frye is a 50 y.o. male with a past medical history of HTN, T2DM,A. Fib , OSA and PE. The patient has followed with Endocrinology clinic since 10/28/2018 for consultative assistance with management of his diabetes.     DIABETIC HISTORY:  Phillip Frye was diagnosed with DM in 2019. Has been on victoza and Januvia .Hemoglobin A1c has ranged from  10.5% in 2020, peaking at 12.8% in 2019.   ON his initial visit to our clinic his A1c was 10.5%. He was on Basaglar and Metformin. Ozempic was added. Basaglar was discontinued 11/2018 due to hypoglycemia.   Works a 3rd shift 8 PM to [employer]   Eats 3 pm, 10 pm and 1 AM    Pt had normal cardiac cath 09/16/2019 SUBJECTIVE:   During the last visit (09/27/2019):  A1c was 6.1% . We continued Metformin and Ozempic.        Today (04/03/2020): Phillip Frye is here for a follow up on diabetes. He checks his blood sugars 2 times daily.  He has lost his insurance again and has been using samples of Ozempic   He is back to work and will have insurance in 30 days.    He has noted weight gain  Denies nausea and diarrhea Had left  knee and foot sx , has a heel spur and is on gabapentin      HOME DIABETES REGIMEN:  Ozempic 0.5 mg weekly  Metformin 1000 mg BID       METER DOWNLOAD SUMMARY:Did not bring    HISTORY:  Past Medical History:  Past Medical History:  Diagnosis Date  . Arthritis   . Atrial fibrillation ([place])   . Diabetes mellitus without complication ([place])   . Dysrhythmia    unknown type  . GERD (gastroesophageal reflux disease)   . Hypertension    MD stopped BP med 1 yr ago due to normal BP  . Obesity   . Pulmonary embolism,  bilateral ([place]) 09/2012   on xarelto  . Shortness of breath    with exertion  . Sleep apnea    uses c pap    Past Surgical History:  Past Surgical History:  Procedure Laterality Date  . BREATH TEK H PYLORI N/A 12/28/2012   Procedure: BREATH TEK H PYLORI;  [place]: Gayland Curry, MD;  Location: [facility];  Service: General;  Laterality: N/A;  . HIP SURGERY Bilateral    to "shave gthe bones"  . KNEE ARTHROSCOPY WITH ANTERIOR CRUCIATE LIGAMENT (ACL) REPAIR Left 01/29/2019   Procedure: KNEE ARTHROSCOPY WITH CHONDROPLASTY AND  ANTERIOR CRUCIATE LIGAMENT (ACL) RPAIR WITH [place];  [place]: Dorna Leitz, MD;  Location: [facility];  Service: Orthopedics;  Laterality: Left;  . LAPAROSCOPIC GASTRIC SLEEVE RESECTION N/A 05/31/2013   Procedure: LAPAROSCOPIC GASTRIC SLEEVE RESECTION;  [place]: Gayland Curry, MD;  Location: [facility];  Service: General;  Laterality: N/A;  . LEFT HEART CATH AND CORONARY ANGIOGRAPHY N/A 09/16/2019   Procedure: LEFT HEART CATH AND CORONARY ANGIOGRAPHY;  [place]: Charolette Forward, MD;  Location: [facility];  Service: Cardiovascular;  Laterality: N/A;    Social History:  reports that he has been smoking cigars. He  has a 6.00 pack-year smoking history. He has never used smokeless tobacco. He reports current alcohol use. He reports that he does not use drugs. Family History:  Family History  Problem Relation Age of Onset  . Cirrhosis Father   . Diabetes Other   . Diabetes Other   . Emphysema Paternal Grandfather        smoked     HOME MEDICATIONS: Allergies as of 04/03/2020   No Known Allergies     Medication List       Accurate as of April 03, 2020  7:45 AM. If you have any questions, ask your nurse or doctor.        amiodarone 200 MG tablet Commonly known as: PACERONE Take 200 mg by mouth daily.   aspirin 81 MG chewable tablet Chew 81 mg by mouth daily. Take 1 tablet by mouth once daily, and may take one in the pm if check pain.    atorvastatin 10 MG tablet Commonly known as: LIPITOR TAKE 1 TABLET (10 MG TOTAL) BY MOUTH DAILY AT 6 PM.   blood glucose meter kit and supplies Dispense based on patient and insurance preference. Use up to four times daily as directed. (FOR ICD-10 E10.9, E11.9).   Eliquis 5 MG Tabs tablet Generic drug: apixaban TAKE 1 TABLET BY MOUTH 2 TIMES DAILY FOR 30 DAYS What changed: See the new instructions.   freestyle lancets   furosemide 40 MG tablet Commonly known as: LASIX Take 40 mg by mouth daily.   gabapentin 100 MG capsule Commonly known as: NEURONTIN Take 1 capsule (100 mg total) by mouth 3 (three) times daily.   glucose blood test strip Up to 3 times per day - DM2 with hyperglycemia. Uncontrolled.   Klor-Con 10 10 MEQ tablet Generic drug: potassium chloride Take 10 mEq by mouth daily.   losartan 100 MG tablet Commonly known as: COZAAR Take 100 mg by mouth daily.   metFORMIN 1000 MG tablet Commonly known as: GLUCOPHAGE Take 1 tablet (1,000 mg total) by mouth 2 (two) times daily with a meal.   metoprolol succinate 100 MG 24 hr tablet Commonly known as: TOPROL-XL Take 100 mg by mouth daily.   metoprolol tartrate 100 MG tablet Commonly known as: LOPRESSOR 1 tablet   oxyCODONE-acetaminophen 5-325 MG tablet Commonly known as: Percocet Take 1 tablet by mouth every 6 (six) hours as needed for severe pain.   Ozempic (0.25 or 0.5 MG/DOSE) 2 MG/1.5ML Sopn Generic drug: Semaglutide(0.25 or 0.5MG /DOS) INJECT 0.375 MLS (0.5 MG TOTAL) INTO THE SKIN ONCE A WEEK.   Skyrizi Pen 150 MG/ML Soaj Generic drug: Risankizumab-rzaa Inject      one pen under the skin every 12 weeks after completing loading dose.   UNABLE TO FIND Med Name: Livago meter   vitamin B-12 500 MCG tablet Commonly known as: CYANOCOBALAMIN Take 500 mcg by mouth daily.   Vitamin D3 50 MCG (2000 UT) Tabs Take 2,000 mg by mouth 2 (two) times daily.        OBJECTIVE:   Vital Signs: BP 140/76    Pulse 80   Ht 6\' 2"  (1.88 m)   Wt (!) 385 lb 4 oz (174.7 kg)   SpO2 98%   BMI 49.46 kg/m   Wt Readings from Last 3 Encounters:  04/03/20 (!) 385 lb 4 oz (174.7 kg)  09/27/19 (!) 364 lb 12.8 oz (165.5 kg)  09/16/19 (!) 360 lb (163.3 kg)     Exam: General: Pt appears well and is in  NAD  Lungs: Clear with good BS bilat   Heart: RRR  Extremities: Trace edema   Neuro: MS is good with appropriate affect, pt is alert and Ox3   DM foot exam: 05/17/2019  The skin of the feet is intact without sores or ulcerations. The pedal pulses are 2+ on right and 2+ on left. The sensation is decreased on the left  to a screening 5.07, 10 gram monofilament      DATA REVIEWED:  Lab Results  Component Value Date   HGBA1C 7.5 (A) 04/03/2020   HGBA1C 6.1 (A) 09/27/2019   HGBA1C 6.4 (H) 04/22/2019   Lab Results  Component Value Date   LDLCALC 62 04/22/2019   CREATININE 1.06 09/16/2019   Lab Results  Component Value Date   MICRALBCREAT 5 04/22/2019     Lab Results  Component Value Date   CHOL 114 04/22/2019   HDL 30 (L) 04/22/2019   LDLCALC 62 04/22/2019   TRIG 124 04/22/2019   CHOLHDL 3.8 04/22/2019         ASSESSMENT / PLAN / RECOMMENDATIONS:   1) Type 2 Diabetes Mellitus,Sub-optimally  Optimally controlled, Without complications - Most recent A1c of 7.5  %. Goal A1c < 7.0 %.    -A1c up due to multiple factors including decrease activity since surgeries , weight gain and possible increase oral intake.  - His fasting this morning was 154 mg/dL but after eating 3 piece of bacon and  2 pieces of bread , his BG 207 mg/dL  - he started [facility] [employer] yesterday, encouraged low carb diet as well as continuing exercise.  - Will increase Ozempic to 1 mg. We provided him with the 0.5  mg Ozempic samples as that's only dose we had.     MEDICATIONS:  Increase Ozempic 1  mg weekly   Continue Metformin 1000 mg BID  EDUCATION / INSTRUCTIONS:  BG monitoring instructions: Patient is  instructed to check his blood sugars 1 times a day, fasting  Call [facility] if: BG persistently < 70  . I reviewed the Rule of 15 for the treatment of hypoglycemia in detail with the patient. Literature supplied.    F/U in 6 months    Signed electronically by: Mack Guise, MD  [facility]  [facility] [address] [place], [place] [address] Phone: [phone] FAX: [phone]   CC: Wendie Agreste, MD [address] [place] [address] Phone: [phone]  Fax: [phone]  Return to Endocrinology clinic as below: No future appointments.

## 2020-04-07 ENCOUNTER — Telehealth: Payer: Self-pay | Admitting: Internal Medicine

## 2020-04-07 MED ORDER — OZEMPIC (1 MG/DOSE) 4 MG/3ML ~~LOC~~ SOPN: 1.0000 mg | PEN_INJECTOR | SUBCUTANEOUS | 11 refills | Status: DC

## 2020-04-07 NOTE — Telephone Encounter (Signed)
Pt called stating his Ozempic needs a PA before he can get it. He states they told him we need to call the insurance to get this started at [phone].   Pt is going to want Ozempic sent to the [facility] off [address] and [address].

## 2020-04-07 NOTE — Telephone Encounter (Signed)
PA have been submitted to patient's insurance. Sent Rx to [facility] as requested

## 2020-05-15 DIAGNOSIS — L400 Psoriasis vulgaris: Secondary | ICD-10-CM | POA: Diagnosis not present

## 2020-05-16 DIAGNOSIS — Z79899 Other long term (current) drug therapy: Secondary | ICD-10-CM | POA: Diagnosis not present

## 2020-05-16 DIAGNOSIS — G4733 Obstructive sleep apnea (adult) (pediatric): Secondary | ICD-10-CM | POA: Diagnosis not present

## 2020-05-23 ENCOUNTER — Ambulatory Visit: Payer: 59 | Admitting: Adult Health

## 2020-05-23 ENCOUNTER — Telehealth: Payer: Self-pay

## 2020-05-23 NOTE — Telephone Encounter (Signed)
LVM for pt to call back,  no cpap data found

## 2020-05-24 ENCOUNTER — Other Ambulatory Visit: Payer: Self-pay | Admitting: Internal Medicine

## 2020-05-27 DIAGNOSIS — J029 Acute pharyngitis, unspecified: Secondary | ICD-10-CM | POA: Diagnosis not present

## 2020-05-27 DIAGNOSIS — J0100 Acute maxillary sinusitis, unspecified: Secondary | ICD-10-CM | POA: Diagnosis not present

## 2020-06-01 DIAGNOSIS — J351 Hypertrophy of tonsils: Secondary | ICD-10-CM | POA: Diagnosis not present

## 2020-06-01 DIAGNOSIS — I4891 Unspecified atrial fibrillation: Secondary | ICD-10-CM | POA: Diagnosis not present

## 2020-06-01 DIAGNOSIS — G4733 Obstructive sleep apnea (adult) (pediatric): Secondary | ICD-10-CM | POA: Diagnosis not present

## 2020-06-01 DIAGNOSIS — Z0000 Encounter for general adult medical examination without abnormal findings: Secondary | ICD-10-CM | POA: Diagnosis not present

## 2020-06-01 DIAGNOSIS — E119 Type 2 diabetes mellitus without complications: Secondary | ICD-10-CM | POA: Diagnosis not present

## 2020-06-01 DIAGNOSIS — J3089 Other allergic rhinitis: Secondary | ICD-10-CM | POA: Diagnosis not present

## 2020-06-01 DIAGNOSIS — Z13220 Encounter for screening for lipoid disorders: Secondary | ICD-10-CM | POA: Diagnosis not present

## 2020-06-05 DIAGNOSIS — E119 Type 2 diabetes mellitus without complications: Secondary | ICD-10-CM | POA: Diagnosis not present

## 2020-06-05 DIAGNOSIS — E785 Hyperlipidemia, unspecified: Secondary | ICD-10-CM | POA: Diagnosis not present

## 2020-06-05 DIAGNOSIS — I480 Paroxysmal atrial fibrillation: Secondary | ICD-10-CM | POA: Diagnosis not present

## 2020-06-05 DIAGNOSIS — I10 Essential (primary) hypertension: Secondary | ICD-10-CM | POA: Diagnosis not present

## 2020-07-04 ENCOUNTER — Emergency Department (HOSPITAL_COMMUNITY)
Admission: EM | Admit: 2020-07-04 | Discharge: 2020-07-04 | Disposition: A | Discharge status: Home / Self Care | Payer: BC Managed Care – PPO | Attending: Emergency Medicine | Admitting: Emergency Medicine

## 2020-07-04 ENCOUNTER — Telehealth: Payer: Self-pay | Admitting: Internal Medicine

## 2020-07-04 ENCOUNTER — Encounter (HOSPITAL_COMMUNITY): Payer: Self-pay

## 2020-07-04 ENCOUNTER — Other Ambulatory Visit: Payer: Self-pay

## 2020-07-04 DIAGNOSIS — Z79899 Other long term (current) drug therapy: Secondary | ICD-10-CM | POA: Insufficient documentation

## 2020-07-04 DIAGNOSIS — I4891 Unspecified atrial fibrillation: Secondary | ICD-10-CM | POA: Insufficient documentation

## 2020-07-04 DIAGNOSIS — E1165 Type 2 diabetes mellitus with hyperglycemia: Secondary | ICD-10-CM | POA: Insufficient documentation

## 2020-07-04 DIAGNOSIS — F17290 Nicotine dependence, other tobacco product, uncomplicated: Secondary | ICD-10-CM | POA: Diagnosis not present

## 2020-07-04 DIAGNOSIS — Z7901 Long term (current) use of anticoagulants: Secondary | ICD-10-CM | POA: Insufficient documentation

## 2020-07-04 DIAGNOSIS — I10 Essential (primary) hypertension: Secondary | ICD-10-CM | POA: Insufficient documentation

## 2020-07-04 DIAGNOSIS — Z7982 Long term (current) use of aspirin: Secondary | ICD-10-CM | POA: Insufficient documentation

## 2020-07-04 DIAGNOSIS — R739 Hyperglycemia, unspecified: Secondary | ICD-10-CM | POA: Diagnosis not present

## 2020-07-04 DIAGNOSIS — Z955 Presence of coronary angioplasty implant and graft: Secondary | ICD-10-CM | POA: Diagnosis not present

## 2020-07-04 DIAGNOSIS — Z7984 Long term (current) use of oral hypoglycemic drugs: Secondary | ICD-10-CM | POA: Diagnosis not present

## 2020-07-04 LAB — CBC
HCT: 45.6 % (ref 39.0–52.0)
Hemoglobin: 15.3 g/dL (ref 13.0–17.0)
MCH: 30.6 pg (ref 26.0–34.0)
MCHC: 33.6 g/dL (ref 30.0–36.0)
MCV: 91.2 fL (ref 80.0–100.0)
Platelets: 243 10*3/uL (ref 150–400)
RBC: 5 MIL/uL (ref 4.22–5.81)
RDW: 11.8 % (ref 11.5–15.5)
WBC: 5.4 10*3/uL (ref 4.0–10.5)
nRBC: 0 % (ref 0.0–0.2)

## 2020-07-04 LAB — BASIC METABOLIC PANEL
Anion gap: 8 (ref 5–15)
BUN: 11 mg/dL (ref 6–20)
CO2: 30 mmol/L (ref 22–32)
Calcium: 8.9 mg/dL (ref 8.9–10.3)
Chloride: 96 mmol/L — ABNORMAL LOW (ref 98–111)
Creatinine, Ser: 1.3 mg/dL — ABNORMAL HIGH (ref 0.61–1.24)
GFR, Estimated: >60 mL/min (ref >60)
Glucose, Bld: 417 mg/dL — ABNORMAL HIGH (ref 70–99)
Potassium: 3.9 mmol/L (ref 3.5–5.1)
Sodium: 134 mmol/L — ABNORMAL LOW (ref 135–145)

## 2020-07-04 LAB — URINALYSIS, ROUTINE W REFLEX MICROSCOPIC
Bacteria, UA: NONE SEEN
Bilirubin Urine: NEGATIVE
Glucose, UA: >=500 mg/dL — AB
Hgb urine dipstick: NEGATIVE
Ketones, ur: NEGATIVE mg/dL
Leukocytes,Ua: NEGATIVE
Nitrite: NEGATIVE
Protein, ur: NEGATIVE mg/dL
Specific Gravity, Urine: 1.031 — ABNORMAL HIGH (ref 1.005–1.030)
pH: 5 (ref 5.0–8.0)

## 2020-07-04 LAB — BLOOD GAS, VENOUS
Acid-Base Excess: 3.3 mmol/L — ABNORMAL HIGH (ref 0.0–2.0)
Bicarbonate: 30.4 mmol/L — ABNORMAL HIGH (ref 20.0–28.0)
O2 Saturation: 45.7 %
Patient temperature: 98.6
pCO2, Ven: 58 mmHg (ref 44.0–60.0)
pH, Ven: 7.339 (ref 7.250–7.430)
pO2, Ven: <31 mmHg — CL (ref 32.0–45.0)

## 2020-07-04 LAB — CBG MONITORING, ED
Glucose-Capillary: 463 mg/dL — ABNORMAL HIGH (ref 70–99)
Glucose-Capillary: 370 mg/dL — ABNORMAL HIGH (ref 70–99)
Glucose-Capillary: 299 mg/dL — ABNORMAL HIGH (ref 70–99)

## 2020-07-04 MED ORDER — INSULIN ASPART 100 UNIT/ML IJ SOLN
4.0000 [IU] | Freq: Once | INTRAMUSCULAR | Status: AC
  Administered 2020-07-04: 4 [IU] via INTRAVENOUS
  Filled 2020-07-04: qty 0.04

## 2020-07-04 MED ORDER — SODIUM CHLORIDE 0.9 % IV BOLUS
500.0000 mL | Freq: Once | INTRAVENOUS | Status: AC
  Administered 2020-07-04: 500 mL via INTRAVENOUS

## 2020-07-04 NOTE — ED Provider Notes (Signed)
[facility] DEPT Provider Note   CSN: [id number] Arrival date & time: 07/04/20  6063     History Chief Complaint  Patient presents with  . Hyperglycemia    Phillip Frye is a 50 y.o. male.  HPI Patient is a type II diabetic.  Presents with high blood sugars.  Has been high for around 2 weeks.  States he gets up around 5-6 times a night to urinate.  States he thinks that sugar is running high because he has not been eating right.  States he is on Ozempic and metformin.  States he has been taking both.  Get seen at [facility].  States sugars have been running around 390 at home.  States around a month ago had a upper respiratory infection but was not on steroids for it.  Feels mild dizziness.  No chest pain.  No trouble breathing.    Past Medical History:  Diagnosis Date  . Arthritis   . Atrial fibrillation ([place])   . Diabetes mellitus without complication ([place])   . Dysrhythmia    unknown type  . GERD (gastroesophageal reflux disease)   . Hypertension    MD stopped BP med 1 yr ago due to normal BP  . Obesity   . Pulmonary embolism, bilateral ([place]) 09/2012   on xarelto  . Shortness of breath    with exertion  . Sleep apnea    uses c pap     Patient Active Problem List   Diagnosis Date Noted  . Atrial fibrillation ([place]) 01/26/2020  . Calcaneal spur 01/26/2020  . Type 2 diabetes mellitus without complication, without long-term current use of insulin ([place]) 02/10/2019  . Left anterior cruciate ligament tear 01/29/2019  . Acute medial meniscus tear of left knee 01/29/2019  . Acute lateral meniscus tear of left knee 01/29/2019  . 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 Type 2 diabetes mellitus with hyperglycemia, with long-term current use of insulin ([place]) 10/28/2018  . Dyslipidemia 10/28/2018  . Primary hypercoagulable state ([place]) 01/18/2018  . 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 Abnormal EKG 12/23/2012  .  Obesity, Class III, BMI 40-49.9 (morbid obesity) ([place]) 12/11/2012  . SOB (shortness of breath) 09/22/2012  . Pulmonary embolism, bilateral ([place]) 09/22/2012  . Diabetes mellitus without complication ([place])   . DENTAL PAIN 03/31/2008  . CARPAL TUNNEL SYNDROME, BILATERAL 10/09/2007  . ULNAR NEUROPATHY 10/09/2007  . DENTAL CARIES 10/09/2007  . GERD 10/09/2007  . INGUINAL PAIN, BILATERAL 02/26/2007  . KNEE PAIN, RIGHT 12/19/2006  . FOOT PAIN, BILATERAL 12/19/2006  . HYPERTENSION 09/18/2006  . AVASCULAR NECROSIS 09/18/2006  . Sleep apnea 09/18/2006    Past Surgical History:  Procedure Laterality Date  . BREATH TEK H PYLORI N/A 12/28/2012   Procedure: BREATH TEK H PYLORI;  [place]: Gayland Curry, MD;  Location: [facility];  Service: General;  Laterality: N/A;  . HIP SURGERY Bilateral    to "shave gthe bones"  . KNEE ARTHROSCOPY WITH ANTERIOR CRUCIATE LIGAMENT (ACL) REPAIR Left 01/29/2019   Procedure: KNEE ARTHROSCOPY WITH CHONDROPLASTY AND  ANTERIOR CRUCIATE LIGAMENT (ACL) RPAIR WITH [place];  [place]: Dorna Leitz, MD;  Location: [facility];  Service: Orthopedics;  Laterality: Left;  . LAPAROSCOPIC GASTRIC SLEEVE RESECTION N/A 05/31/2013   Procedure: LAPAROSCOPIC GASTRIC SLEEVE RESECTION;  [place]: Gayland Curry, MD;  Location[facility];  Service: General;  Laterality: N/A;  . LEFT HEART CATH AND CORONARY ANGIOGRAPHY N/A 09/16/2019   Procedure: LEFT HEART CATH  AND CORONARY ANGIOGRAPHY;  [place]: Rinaldo Cloud, MD;  [facility];  Service: Cardiovascular;  Laterality: N/A;       Family History  Problem Relation Age of Onset  . Cirrhosis Father   . Diabetes Other   . Diabetes Other   . Emphysema Paternal Grandfather        smoked    Social History   Tobacco Use  . Smoking status: Current Every Day Smoker    Packs/day: 1.00    Years: 6.00    Pack years: 6.00    Types: Cigars  . Smokeless tobacco: Never Used  . Tobacco comment: 3 cigars a day   Vaping Use  . Vaping Use: Never used  Substance Use Topics  . Alcohol use: Yes    Comment: occasional  . Drug use: No    Home Medications Prior to Admission medications   Medication Sig Start Date End Date Taking? Authorizing Provider  amiodarone (PACERONE) 200 MG tablet Take 200 mg by mouth at bedtime. 05/12/19  Yes [provider]  Cholecalciferol (VITAMIN D3) 50 MCG (2000 UT) TABS Take 4,000 mg by mouth 2 (two) times daily.   Yes [provider]  ELIQUIS 5 MG TABS tablet TAKE 1 TABLET BY MOUTH 2 TIMES DAILY FOR 30 DAYS Patient taking differently: 5 mg 2 (two) times daily. 10/21/18  Yes Shade Flood, MD  furosemide (LASIX) 40 MG tablet Take 40 mg by mouth daily. 05/27/19  Yes [provider]  KLOR-CON 10 10 MEQ tablet Take 10 mEq by mouth daily. 05/27/19  Yes [provider]  losartan (COZAAR) 100 MG tablet Take 100 mg by mouth at bedtime.   Yes [provider]  metFORMIN (GLUCOPHAGE) 1000 MG tablet TAKE 1 TABLET (1,000 MG TOTAL) BY MOUTH 2 (TWO) TIMES DAILY WITH A MEAL. 05/24/20  Yes Shamleffer, Konrad Dolores, MD  metoprolol succinate (TOPROL-XL) 100 MG 24 hr tablet Take 100 mg by mouth in the morning. 05/14/19  Yes [provider]  metoprolol succinate (TOPROL-XL) 50 MG 24 hr tablet Take 50 mg by mouth every evening. 06/05/20  Yes [provider]  Semaglutide, 1 MG/DOSE, (OZEMPIC, 1 MG/DOSE,) 4 MG/3ML SOPN Inject 1 mg into the skin once a week. Patient taking differently: Inject 1 mg into the skin every Thursday. 04/07/20  Yes Shamleffer, Konrad Dolores, MD  SKYRIZI PEN 150 MG/ML SOAJ Inject 150 mg into the skin every 3 (three) months. 11/24/19  Yes [provider]  vitamin B-12 (CYANOCOBALAMIN) 500 MCG tablet Take 500 mcg by mouth daily.   Yes [provider]  aspirin 81 MG chewable tablet Chew 81 mg by mouth daily. Take 1 tablet by mouth once daily, and may take one in the pm if check pain. Patient not  taking: No sig reported    [provider]  atorvastatin (LIPITOR) 10 MG tablet TAKE 1 TABLET (10 MG TOTAL) BY MOUTH DAILY AT 6 PM. 04/19/19   Shade Flood, MD  blood glucose meter kit and supplies Dispense based on patient and insurance preference. Use up to four times daily as directed. (FOR ICD-10 E10.9, E11.9). 01/15/18   Shade Flood, MD  gabapentin (NEURONTIN) 100 MG capsule Take 1 capsule (100 mg total) by mouth 3 (three) times daily. Patient not taking: No sig reported 03/27/20   Felecia Shelling, DPM  glucose blood test strip Up to 3 times per day - DM2 with hyperglycemia. Uncontrolled. 10/22/18   Shade Flood, MD  Lancets (FREESTYLE) lancets  01/15/18   [provider]  metoprolol tartrate (LOPRESSOR) 100 MG tablet 1 tablet Patient not taking: No sig reported    [provider]  oxyCODONE-acetaminophen (PERCOCET) 5-325 MG tablet Take 1 tablet by mouth every 6 (six) hours as needed for severe pain. Patient not taking: No sig reported 12/29/19   Edrick Kins, DPM  UNABLE TO FIND Med Name: Livago meter    [provider]  amLODipine (NORVASC) 5 MG tablet Take 1 tablet (5 mg total) by mouth daily. 01/01/18 12/23/18  Wendie Agreste, MD  levocetirizine (XYZAL) 5 MG tablet Take 1 tablet (5 mg total) by mouth every evening. 08/23/17 01/26/19  Kennyth Arnold, FNP    Allergies    Patient has no known allergies.  Review of Systems   Review of Systems  Constitutional: Negative for appetite change.  HENT: Negative for congestion.   Respiratory: Negative for shortness of breath.   Cardiovascular: Negative for leg swelling.  Gastrointestinal: Negative for abdominal pain.  Endocrine: Positive for polydipsia and polyuria.  Genitourinary: Positive for frequency.  Musculoskeletal: Negative for back pain.  Skin: Negative for rash.  Neurological: Positive for dizziness.  Psychiatric/Behavioral: Negative for confusion.    Physical Exam Updated  Vital Signs BP (!) 114/101   Pulse (!) 114   Temp 98.3 F (36.8 C) (Oral)   Resp 16   Ht $R'6\' 2"'sy$  (1.88 m)   Wt (!) 167.8 kg   SpO2 92%   BMI 47.51 kg/m   Physical Exam Vitals and nursing note reviewed.  Constitutional:      Appearance: Normal appearance. He is obese.  HENT:     Head: Normocephalic.     Mouth/Throat:     Mouth: Mucous membranes are moist.  Eyes:     Pupils: Pupils are equal, round, and reactive to light.  Cardiovascular:     Rate and Rhythm: Normal rate and regular rhythm.  Pulmonary:     Breath sounds: No wheezing or rhonchi.  Abdominal:     Tenderness: There is no abdominal tenderness.  Musculoskeletal:        General: No tenderness.  Skin:    General: Skin is warm.     Capillary Refill: Capillary refill takes less than 2 seconds.  Neurological:     Mental Status: He is alert and oriented to person, place, and time.  Psychiatric:        Mood and Affect: Mood normal.     ED Results / Procedures / Treatments   Labs (all labs ordered are listed, but only abnormal results are displayed) Labs Reviewed  BASIC METABOLIC PANEL - Abnormal; Notable for the following components:      Result Value   Sodium 134 (*)    Chloride 96 (*)    Glucose, Bld 417 (*)    Creatinine, Ser 1.30 (*)    All other components within normal limits  URINALYSIS, ROUTINE W REFLEX MICROSCOPIC - Abnormal; Notable for the following components:   Specific Gravity, Urine 1.031 (*)    Glucose, UA >=500 (*)    All other components within normal limits  BLOOD GAS, VENOUS - Abnormal; Notable for the following components:   pO2, Ven <31.0 (*)    Bicarbonate 30.4 (*)    Acid-Base Excess 3.3 (*)    All other components within normal limits  CBG MONITORING, ED - Abnormal; Notable for the following components:   Glucose-Capillary 463 (*)    All other components within normal  limits  CBG MONITORING, ED - Abnormal; Notable for the following components:   Glucose-Capillary 370 (*)    All  other components within normal limits  CBG MONITORING, ED - Abnormal; Notable for the following components:   Glucose-Capillary 299 (*)    All other components within normal limits  CBC  CBG MONITORING, ED  CBG MONITORING, ED    EKG None  Radiology No results found.  Procedures Procedures   Medications Ordered in ED Medications  sodium chloride 0.9 % bolus 500 mL (0 mLs Intravenous Stopped 07/04/20 1318)  insulin aspart (novoLOG) injection 4 Units (4 Units Intravenous Given 07/04/20 1408)    ED Course  I have reviewed the triage vital signs and the nursing notes.  Pertinent labs & imaging results that were available during my care of the patient were reviewed by me and considered in my medical decision making (see chart for details).    MDM Rules/Calculators/A&P                          Patient with hyperglycemia.  On Ozempic and metformin.  Patient states he thinks it may be the way he is eating and sleeping.  Not in DKA.  Has been given fluids and insulin and sugars come down to under 300.  Patient is eager to go home.  I think it is okay for him to go home now.  May need medication adjustment but his endocrinologist can figure this out.  Discussed with patient about his diet.  Discharge home. Final Clinical Impression(s) / ED Diagnoses Final diagnoses:  Hyperglycemia    Rx / DC Orders ED Discharge Orders    None       Davonna Belling, MD 07/04/20 1540

## 2020-07-04 NOTE — ED Triage Notes (Signed)
Patient reports that he has had elevated blood sugars x 2 weeks. Patient reports frequent urination and thirst. Patient also c/o dizziness, but denies N/V or abdominal pain.  CBG- in triage 463.

## 2020-07-04 NOTE — Discharge Instructions (Addendum)
Talk to endocrinology about potential switching your medication.  Your sugars are high but you do not appear to be in DKA.

## 2020-07-05 ENCOUNTER — Emergency Department (HOSPITAL_COMMUNITY): Payer: BC Managed Care – PPO

## 2020-07-05 ENCOUNTER — Encounter (HOSPITAL_COMMUNITY): Payer: Self-pay

## 2020-07-05 ENCOUNTER — Emergency Department (HOSPITAL_COMMUNITY)
Admission: EM | Admit: 2020-07-05 | Discharge: 2020-07-06 | Disposition: A | Discharge status: Home / Self Care | Payer: BC Managed Care – PPO | Attending: Emergency Medicine | Admitting: Emergency Medicine

## 2020-07-05 ENCOUNTER — Other Ambulatory Visit: Payer: Self-pay

## 2020-07-05 DIAGNOSIS — Z79899 Other long term (current) drug therapy: Secondary | ICD-10-CM | POA: Diagnosis not present

## 2020-07-05 DIAGNOSIS — Z20822 Contact with and (suspected) exposure to covid-19: Secondary | ICD-10-CM | POA: Insufficient documentation

## 2020-07-05 DIAGNOSIS — F17210 Nicotine dependence, cigarettes, uncomplicated: Secondary | ICD-10-CM | POA: Diagnosis not present

## 2020-07-05 DIAGNOSIS — Z7984 Long term (current) use of oral hypoglycemic drugs: Secondary | ICD-10-CM | POA: Insufficient documentation

## 2020-07-05 DIAGNOSIS — R42 Dizziness and giddiness: Secondary | ICD-10-CM | POA: Insufficient documentation

## 2020-07-05 DIAGNOSIS — E1165 Type 2 diabetes mellitus with hyperglycemia: Secondary | ICD-10-CM | POA: Diagnosis not present

## 2020-07-05 DIAGNOSIS — I10 Essential (primary) hypertension: Secondary | ICD-10-CM | POA: Diagnosis not present

## 2020-07-05 DIAGNOSIS — R739 Hyperglycemia, unspecified: Secondary | ICD-10-CM

## 2020-07-05 DIAGNOSIS — Z7901 Long term (current) use of anticoagulants: Secondary | ICD-10-CM | POA: Insufficient documentation

## 2020-07-05 DIAGNOSIS — Z7982 Long term (current) use of aspirin: Secondary | ICD-10-CM | POA: Diagnosis not present

## 2020-07-05 DIAGNOSIS — R059 Cough, unspecified: Secondary | ICD-10-CM | POA: Diagnosis not present

## 2020-07-05 DIAGNOSIS — R21 Rash and other nonspecific skin eruption: Secondary | ICD-10-CM | POA: Diagnosis not present

## 2020-07-05 DIAGNOSIS — I517 Cardiomegaly: Secondary | ICD-10-CM | POA: Diagnosis not present

## 2020-07-05 LAB — BASIC METABOLIC PANEL
Anion gap: 10 (ref 5–15)
BUN: 16 mg/dL (ref 6–20)
CO2: 25 mmol/L (ref 22–32)
Calcium: 9.1 mg/dL (ref 8.9–10.3)
Chloride: 97 mmol/L — ABNORMAL LOW (ref 98–111)
Creatinine, Ser: 1.35 mg/dL — ABNORMAL HIGH (ref 0.61–1.24)
GFR, Estimated: >60 mL/min (ref >60)
Glucose, Bld: 574 mg/dL (ref 70–99)
Potassium: 4.1 mmol/L (ref 3.5–5.1)
Sodium: 132 mmol/L — ABNORMAL LOW (ref 135–145)

## 2020-07-05 LAB — BLOOD GAS, VENOUS
Acid-Base Excess: 2.6 mmol/L — ABNORMAL HIGH (ref 0.0–2.0)
Bicarbonate: 28.9 mmol/L — ABNORMAL HIGH (ref 20.0–28.0)
O2 Saturation: 79.3 %
Patient temperature: 98.6
pCO2, Ven: 53.3 mmHg (ref 44.0–60.0)
pH, Ven: 7.353 (ref 7.250–7.430)
pO2, Ven: 47 mmHg — ABNORMAL HIGH (ref 32.0–45.0)

## 2020-07-05 LAB — URINALYSIS, ROUTINE W REFLEX MICROSCOPIC
Bacteria, UA: NONE SEEN
Bilirubin Urine: NEGATIVE
Glucose, UA: >=500 mg/dL — AB
Hgb urine dipstick: NEGATIVE
Ketones, ur: NEGATIVE mg/dL
Leukocytes,Ua: NEGATIVE
Nitrite: NEGATIVE
Protein, ur: NEGATIVE mg/dL
Specific Gravity, Urine: 1.026 (ref 1.005–1.030)
pH: 5 (ref 5.0–8.0)

## 2020-07-05 LAB — CBC
HCT: 44.4 % (ref 39.0–52.0)
Hemoglobin: 14.8 g/dL (ref 13.0–17.0)
MCH: 30.6 pg (ref 26.0–34.0)
MCHC: 33.3 g/dL (ref 30.0–36.0)
MCV: 91.7 fL (ref 80.0–100.0)
Platelets: 244 10*3/uL (ref 150–400)
RBC: 4.84 MIL/uL (ref 4.22–5.81)
RDW: 11.9 % (ref 11.5–15.5)
WBC: 5.7 10*3/uL (ref 4.0–10.5)
nRBC: 0 % (ref 0.0–0.2)

## 2020-07-05 LAB — CBG MONITORING, ED
Glucose-Capillary: 545 mg/dL (ref 70–99)
Glucose-Capillary: 563 mg/dL (ref 70–99)
Glucose-Capillary: 466 mg/dL — ABNORMAL HIGH (ref 70–99)
Glucose-Capillary: 481 mg/dL — ABNORMAL HIGH (ref 70–99)
Glucose-Capillary: 310 mg/dL — ABNORMAL HIGH (ref 70–99)
Glucose-Capillary: 248 mg/dL — ABNORMAL HIGH (ref 70–99)

## 2020-07-05 LAB — RESP PANEL BY RT-PCR (FLU A&B, COVID) ARPGX2
Influenza A by PCR: NEGATIVE
Influenza B by PCR: NEGATIVE
SARS Coronavirus 2 by RT PCR: NEGATIVE

## 2020-07-05 LAB — BETA-HYDROXYBUTYRIC ACID: Beta-Hydroxybutyric Acid: 0.15 mmol/L (ref 0.05–0.27)

## 2020-07-05 MED ORDER — INSULIN REGULAR(HUMAN) IN NACL 100-0.9 UT/100ML-% IV SOLN
INTRAVENOUS | Status: DC
  Administered 2020-07-05: 15 [IU]/h via INTRAVENOUS
  Filled 2020-07-05: qty 100

## 2020-07-05 MED ORDER — LACTATED RINGERS IV SOLN: INTRAVENOUS | Status: DC

## 2020-07-05 MED ORDER — DEXTROSE 50 % IV SOLN: 0.0000 mL | INTRAVENOUS | Status: DC | PRN

## 2020-07-05 MED ORDER — DEXTROSE IN LACTATED RINGERS 5 % IV SOLN: INTRAVENOUS | Status: DC

## 2020-07-05 NOTE — ED Provider Notes (Signed)
[facility] DEPT Provider Note   CSN: [id number] Arrival date & time: 07/05/20  1845     History Chief Complaint  Patient presents with  . Hyperglycemia    Phillip Frye is a 50 y.o. male.  50 y.o male with a past medical history of DM, A. Fib, PE, resents to the ED with a chief complaint of elevated blood glucose.  Patient was evaluated in the emergency department yesterday, was determined not to be in DKA and was discharged home.  He reports while at home he is felt overall fatigued, lightheaded, his sugars have been running in the 500s while at home despite oral regimen.  According to his records patient is currently on metformin along with Ozempic.  Does report compliance with this medication.  The history is provided by the patient and medical records.  Hyperglycemia Blood sugar level PTA:  500 Severity:  Moderate Onset quality:  Sudden Duration:  2 days Timing:  Constant Progression:  Worsening Chronicity:  New Diabetes status:  Controlled with oral medications Context: not change in medication, not insulin pump use, not new diabetes diagnosis, not recent change in diet and not recent illness   Ineffective treatments:  Oral agents Associated symptoms: shortness of breath   Associated symptoms: no abdominal pain, no chest pain, no fever, no nausea and no vomiting        Past Medical History:  Diagnosis Date  . Arthritis   . Atrial fibrillation ([place])   . Diabetes mellitus without complication ([place])   . Dysrhythmia    unknown type  . GERD (gastroesophageal reflux disease)   . Hypertension    MD stopped BP med 1 yr ago due to normal BP  . Obesity   . Pulmonary embolism, bilateral ([place]) 09/2012   on xarelto  . Shortness of breath    with exertion  . Sleep apnea    uses c pap     Patient Active Problem List   Diagnosis Date Noted  . Atrial fibrillation ([place]) 01/26/2020  . Calcaneal spur 01/26/2020  . Type 2 diabetes mellitus  without complication, without long-term current use of insulin ([place]) 02/10/2019  . Left anterior cruciate ligament tear 01/29/2019  . Acute medial meniscus tear of left knee 01/29/2019  . Acute lateral meniscus tear of left knee 01/29/2019  . 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 Type 2 diabetes mellitus with hyperglycemia, with long-term current use of insulin ([place]) 10/28/2018  . Dyslipidemia 10/28/2018  . Primary hypercoagulable state ([place]) 01/18/2018  . 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 Abnormal EKG 12/23/2012  . Obesity, Class III, BMI 40-49.9 (morbid obesity) ([place]) 12/11/2012  . SOB (shortness of breath) 09/22/2012  . Pulmonary embolism, bilateral ([place]) 09/22/2012  . Diabetes mellitus without complication ([place])   . DENTAL PAIN 03/31/2008  . CARPAL TUNNEL SYNDROME, BILATERAL 10/09/2007  . ULNAR NEUROPATHY 10/09/2007  . DENTAL CARIES 10/09/2007  . GERD 10/09/2007  . INGUINAL PAIN, BILATERAL 02/26/2007  . KNEE PAIN, RIGHT 12/19/2006  . FOOT PAIN, BILATERAL 12/19/2006  . HYPERTENSION 09/18/2006  . AVASCULAR NECROSIS 09/18/2006  . Sleep apnea 09/18/2006    Past Surgical History:  Procedure Laterality Date  . BREATH TEK H PYLORI N/A 12/28/2012   Procedure: BREATH TEK H PYLORI;  Lip[place]: Gayland Curry, MD;  Location: [facility];  Service: General;  Laterality: N/A;  . HIP SURGERY Bilateral    to "shave gthe bones"  . KNEE ARTHROSCOPY WITH ANTERIOR CRUCIATE LIGAMENT (ACL) REPAIR Left  01/29/2019   Procedure: KNEE ARTHROSCOPY WITH CHONDROPLASTY AND  ANTERIOR CRUCIATE LIGAMENT (ACL) RPAIR WITH [place];  [place]uharty: Dorna Leitz, MD;  Location: [facility];  Service: Orthopedics;  Laterality: Left;  . LAPAROSCOPIC GASTRIC SLEEVE RESECTION N/A 05/31/2013   Procedure: LAPAROSCOPIC GASTRIC SLEEVE RESECTION;  Apostol: Gayland Curry, MD;  Location: [facility];  Service: General;  Laterality: N/A;  . LEFT HEART CATH AND CORONARY ANGIOGRAPHY N/A 09/16/2019    Procedure: LEFT HEART CATH AND CORONARY ANGIOGRAPHY;  [place]: Charolette Forward, MD;  Location: [facility];  Service: Cardiovascular;  Laterality: N/A;       Family History  Problem Relation Age of Onset  . Cirrhosis Father   . Diabetes Other   . Diabetes Other   . Emphysema Paternal Grandfather        smoked    Social History   Tobacco Use  . Smoking status: Current Every Day Smoker    Packs/day: 1.00    Years: 6.00    Pack years: 6.00    Types: Cigars  . Smokeless tobacco: Never Used  . Tobacco comment: 3 cigars a day  Vaping Use  . Vaping Use: Never used  Substance Use Topics  . Alcohol use: Yes    Comment: occasional  . Drug use: No    Home Medications Prior to Admission medications   Medication Sig Start Date End Date Taking? Authorizing Provider  amiodarone (PACERONE) 200 MG tablet Take 200 mg by mouth daily. 05/12/19  Yes [provider]  aspirin 81 MG chewable tablet Chew 81 mg by mouth daily. Take 1 tablet by mouth once daily, and may take one in the pm if check pain.   Yes [provider]  atorvastatin (LIPITOR) 10 MG tablet TAKE 1 TABLET (10 MG TOTAL) BY MOUTH DAILY AT 6 PM. Patient taking differently: Take 10 mg by mouth daily. 04/19/19  Yes Wendie Agreste, MD  blood glucose meter kit and supplies Dispense based on patient and insurance preference. Use up to four times daily as directed. (FOR ICD-10 E10.9, E11.9). 01/15/18  Yes Wendie Agreste, MD  ELIQUIS 5 MG TABS tablet TAKE 1 TABLET BY MOUTH 2 TIMES DAILY FOR 30 DAYS Patient taking differently: Take 5 mg by mouth 2 (two) times daily. 10/21/18  Yes Wendie Agreste, MD  furosemide (LASIX) 40 MG tablet Take 40 mg by mouth daily. 05/27/19  Yes [provider]  glucose blood test strip Up to 3 times per day - DM2 with hyperglycemia. Uncontrolled. 10/22/18  Yes Wendie Agreste, MD  KLOR-CON 10 10 MEQ tablet Take 10 mEq by mouth daily. 05/27/19  Yes [provider]   losartan (COZAAR) 100 MG tablet Take 100 mg by mouth daily.   Yes [provider]  metFORMIN (GLUCOPHAGE) 1000 MG tablet TAKE 1 TABLET (1,000 MG TOTAL) BY MOUTH 2 (TWO) TIMES DAILY WITH A MEAL. 05/24/20  Yes Shamleffer, Melanie Crazier, MD  metoprolol succinate (TOPROL-XL) 100 MG 24 hr tablet Take 100 mg by mouth in the morning. 05/14/19  Yes [provider]  metoprolol succinate (TOPROL-XL) 50 MG 24 hr tablet Take 50 mg by mouth every evening. 06/05/20  Yes [provider]  Semaglutide, 1 MG/DOSE, (OZEMPIC, 1 MG/DOSE,) 4 MG/3ML SOPN Inject 1 mg into the skin once a week. Patient taking differently: Inject 1 mg into the skin once a week. Take on Thursdays 04/07/20  Yes Shamleffer, Melanie Crazier, MD  SKYRIZI PEN 150 MG/ML SOAJ Inject 150  mg into the skin every 3 (three) months. 11/24/19  Yes [provider]  UNABLE TO FIND Med Name: Anitra Lauth meter   Yes [provider]  vitamin B-12 (CYANOCOBALAMIN) 500 MCG tablet Take 500 mcg by mouth daily.   Yes [provider]  Lancets (FREESTYLE) lancets  01/15/18   [provider]  amLODipine (NORVASC) 5 MG tablet Take 1 tablet (5 mg total) by mouth daily. 01/01/18 12/23/18  Wendie Agreste, MD  levocetirizine (XYZAL) 5 MG tablet Take 1 tablet (5 mg total) by mouth every evening. 08/23/17 01/26/19  Kennyth Arnold, FNP    Allergies    Patient has no known allergies.  Review of Systems   Review of Systems  Constitutional: Negative for fever.  HENT: Negative for sore throat.   Respiratory: Positive for shortness of breath.   Cardiovascular: Negative for chest pain.  Gastrointestinal: Negative for abdominal pain, nausea and vomiting.  Genitourinary: Negative for flank pain.  Musculoskeletal: Negative for back pain.  Skin: Negative for pallor and wound.  Neurological: Negative for light-headedness and headaches.  All other systems reviewed and are negative.   Physical Exam Updated Vital  Signs BP (!) 162/81   Pulse 85   Temp 98 F (36.7 C) (Oral)   Resp 20   SpO2 95%   Physical Exam Vitals and nursing note reviewed.  Constitutional:      Appearance: Normal appearance. He is obese.  HENT:     Head: Normocephalic and atraumatic.     Mouth/Throat:     Mouth: Mucous membranes are dry.  Eyes:     Pupils: Pupils are equal, round, and reactive to light.  Cardiovascular:     Rate and Rhythm: Normal rate.  Pulmonary:     Effort: Pulmonary effort is normal.     Breath sounds: No wheezing or rales.  Abdominal:     General: Abdomen is flat.     Tenderness: There is no abdominal tenderness. There is no right CVA tenderness or left CVA tenderness.  Musculoskeletal:     Cervical back: Normal range of motion and neck supple.  Skin:    General: Skin is warm and dry.  Neurological:     Mental Status: He is alert and oriented to person, place, and time.     ED Results / Procedures / Treatments   Labs (all labs ordered are listed, but only abnormal results are displayed) Labs Reviewed  BASIC METABOLIC PANEL - Abnormal; Notable for the following components:      Result Value   Sodium 132 (*)    Chloride 97 (*)    Glucose, Bld 574 (*)    Creatinine, Ser 1.35 (*)    All other components within normal limits  URINALYSIS, ROUTINE W REFLEX MICROSCOPIC - Abnormal; Notable for the following components:   Color, Urine STRAW (*)    Glucose, UA >=500 (*)    All other components within normal limits  BLOOD GAS, VENOUS - Abnormal; Notable for the following components:   pO2, Ven 47.0 (*)    Bicarbonate 28.9 (*)    Acid-Base Excess 2.6 (*)    All other components within normal limits  CBG MONITORING, ED - Abnormal; Notable for the following components:   Glucose-Capillary 545 (*)    All other components within normal limits  CBG MONITORING, ED - Abnormal; Notable for the following components:   Glucose-Capillary 563 (*)    All other components within normal limits  CBG  MONITORING, ED - Abnormal;  Notable for the following components:   Glucose-Capillary 466 (*)    All other components within normal limits  CBG MONITORING, ED - Abnormal; Notable for the following components:   Glucose-Capillary 481 (*)    All other components within normal limits  CBG MONITORING, ED - Abnormal; Notable for the following components:   Glucose-Capillary 310 (*)    All other components within normal limits  CBG MONITORING, ED - Abnormal; Notable for the following components:   Glucose-Capillary 248 (*)    All other components within normal limits  RESP PANEL BY RT-PCR (FLU A&B, COVID) ARPGX2  CBC  BETA-HYDROXYBUTYRIC ACID    EKG None  Radiology DG Chest 1 View  Result Date: 07/05/2020 CLINICAL DATA:  50 year old male with cough. EXAM: CHEST  1 VIEW COMPARISON:  Chest radiograph dated 03/17/2019 and CT dated 08/12/2018. FINDINGS: No focal consolidation, pleural effusion, or pneumothorax. There is mild cardiomegaly with mild central vascular congestion. No acute osseous pathology. IMPRESSION: Mild cardiomegaly with mild central vascular congestion. No focal consolidation. Electronically Signed   By: Anner Crete M.D.   On: 07/05/2020 20:30    Procedures Procedures   Medications Ordered in ED Medications  insulin regular, human (MYXREDLIN) 100 units/ 100 mL infusion (7.5 Units/hr Intravenous Rate/Dose Change 07/05/20 2325)  lactated ringers infusion ( Intravenous Stopped 07/05/20 2326)  dextrose 5 % in lactated ringers infusion ( Intravenous New Bag/Given 07/05/20 2327)  dextrose 50 % solution 0-50 mL (has no administration in time range)    ED Course  I have reviewed the triage vital signs and the nursing notes.  Pertinent labs & imaging results that were available during my care of the patient were reviewed by me and considered in my medical decision making (see chart for details).  Clinical Course as of 07/05/20 2348  Wed Jul 05, 2020  2010  Glucose-Capillary(!!): 563 [JS]  2319 SARS Coronavirus 2 by RT PCR: NEGATIVE [JS]  2319 Glucose-Capillary(!): 248 [JS]    Clinical Course User Index [JS] Janeece Fitting, PA-C   MDM Rules/Calculators/A&P     Patient presents to the ED with a chief complaint of elevated blood glucose.  Patient was evaluated in the ED yesterday, had a blood sugar level in the 300s, prior to arrival he reports his blood sugar was around the 400s.  On arrival, CBG was obtained with a level at 545.  Patient's work-up yesterday did not show patient in DKA, on today's labs.  CBC is without any leukocytosis, hemoglobin is within normal limits.  BMP remarkable for slight hyponatremia, glucose level was 574, creatinine level is 1.35, this is slightly worsened than yesterday, however does report that he has been trying to hydrate as much as possible.  Blood gas was obtained, bicarb is slightly elevated but reassuring P O2.  UA is without any nitrites, leukocytes, no white blood cell count.  A COVID test was obtained which was negative as well.  She was started on a glucose stabilizer, provided with insulin, we were able to bring his blood sugar down to 248.  A chest x-ray did show some cardiomegaly along with Mild cardiomegaly with mild central vascular congestion. No focal  consolidation.     Patient does not have any fever, no abdominal pain, no signs of urinary tract infection.  I do see his regimen is ozempic injection once weekly along with metformin at 1000 mg twice daily.  He does Report compliance with medication.  He is due for his injection tomorrow morning.  He  has not had any sick contacts, no URI symptoms, no chest pain, no shortness of breath.  Also noted during his stay oxygen saturation waxes and wanes around 95% all the way down to 91%, he does report suffering from sleep apnea, is not compliant with CPAP machine at home and this is not out of the ordinary for him.  He is currently not on any supplemental  oxygen at home.  Results of all his labs were discussed with patient at length, all questions were answered.  He is agreeable of plan and management, patient is stable for discharge.   Portions of this note were generated with [employer]obby[employer]. Dictation errors may occur despite best attempts at proofreading.  Final Clinical Impression(s) / ED Diagnoses Final diagnoses:  Hyperglycemia    Rx / DC Orders ED Discharge Orders    None       Janeece Fitting, Hershal Coria 07/05/20 2348    Milton Ferguson, MD 07/11/20 1230

## 2020-07-05 NOTE — ED Triage Notes (Signed)
Pt reports CBG of 550 and endorses headache and intermittent confusion. Pt reports being seen here yesterday for same. Denies N/V/D and abdominal pain.

## 2020-07-05 NOTE — ED Provider Notes (Signed)
Emergency Medicine Provider Triage Evaluation Note  Maurie Olesen Slane , a 50 y.o. male  was evaluated in triage.  Pt complains of hyperglycemia, lightheadedness.  Review of Systems  Positive: Hyperglycemia, lightheadedness Negative: Vomiting  Physical Exam  BP (!) 147/91 (BP Location: Left Arm)   Pulse 88   Temp 98 F (36.7 C) (Oral)   Resp (!) 22   SpO2 95%  Gen:   Awake, no distress   Resp:  Normal effort  MSK:   Moves extremities without difficulty  Other:    Medical Decision Making  Medically screening exam initiated at 7:02 PM.  Appropriate orders placed.  Tonita Cong Haught was informed that the remainder of the evaluation will be completed by another provider, this initial triage assessment does not replace that evaluation, and the importance of remaining in the ED until their evaluation is complete.     Anselm Pancoast, PA-C 07/05/20 1940    Wynetta Fines, MD 07/12/20 [phone]

## 2020-07-05 NOTE — Discharge Instructions (Addendum)
Your laboratory results today were within normal limits.  You will be receiving your Ozempic injection tomorrow. Please follow-up with your primary care physician in order to obtainfollow up with your primary care physician in order to obtain a new regimen for control of your blood sugar.  If you experience any chest pain, shortness of breath or worsening symptoms please return to the ED.

## 2020-07-06 ENCOUNTER — Encounter: Payer: Self-pay | Admitting: Internal Medicine

## 2020-07-06 ENCOUNTER — Ambulatory Visit: Payer: BC Managed Care – PPO | Admitting: Internal Medicine

## 2020-07-06 ENCOUNTER — Other Ambulatory Visit: Payer: Self-pay | Admitting: Internal Medicine

## 2020-07-06 VITALS — BP 138/78 | HR 82 | Ht 74.0 in | Wt 381.4 lb

## 2020-07-06 DIAGNOSIS — Z794 Long term (current) use of insulin: Secondary | ICD-10-CM

## 2020-07-06 DIAGNOSIS — E1165 Type 2 diabetes mellitus with hyperglycemia: Secondary | ICD-10-CM | POA: Diagnosis not present

## 2020-07-06 LAB — POCT GLUCOSE (DEVICE FOR HOME USE): POC Glucose: 339 mg/dl — AB (ref 70–99)

## 2020-07-06 LAB — POCT GLYCOSYLATED HEMOGLOBIN (HGB A1C): Hemoglobin A1C: 10.9 % — AB (ref 4.0–5.6)

## 2020-07-06 LAB — CBG MONITORING, ED: Glucose-Capillary: 170 mg/dL — ABNORMAL HIGH (ref 70–99)

## 2020-07-06 MED ORDER — INSULIN PEN NEEDLE 32G X 4 MM MISC: 1.0000 | Freq: Every day | 3 refills | Status: DC

## 2020-07-06 MED ORDER — TOUJEO MAX SOLOSTAR 300 UNIT/ML ~~LOC~~ SOPN: 36.0000 [IU] | PEN_INJECTOR | Freq: Every day | SUBCUTANEOUS | 6 refills | Status: DC

## 2020-07-06 NOTE — Patient Instructions (Addendum)
-  Continue Ozempic 1 mg weekly -Continue  Metformin 1000  Twice a day with meals - Start Toujeo 36 units daily      HOW TO TREAT LOW BLOOD SUGARS (Blood sugar LESS THAN 70 MG/DL)  Please follow the RULE OF 15 for the treatment of hypoglycemia treatment (when your (blood sugars are less than 70 mg/dL)    STEP 1: Take 15 grams of carbohydrates when your blood sugar is low, which includes:   3-4 GLUCOSE TABS  OR  3-4 OZ OF JUICE OR REGULAR SODA OR  ONE TUBE OF GLUCOSE GEL     STEP 2: RECHECK blood sugar in 15 MINUTES STEP 3: If your blood sugar is still low at the 15 minute recheck --> then, go back to STEP 1 and treat AGAIN with another 15 grams of carbohydrates.

## 2020-07-06 NOTE — Progress Notes (Signed)
Name: Phillip Frye  Age/ Sex: 50 y.o., male   MRN/ DOB: [id number], 1970-06-20     PCP: System, Provider Not In   Reason for Endocrinology Evaluation: Type 2 Diabetes Mellitus  Initial Endocrine Consultative Visit: 10/28/2018    PATIENT IDENTIFIER: Phillip Frye is a 50 y.o. male with a past medical history of HTN, T2DM,A. Fib , OSA and PE. The patient has followed with Endocrinology clinic since 10/28/2018 for consultative assistance with management of his diabetes.     DIABETIC HISTORY:  Phillip Frye was diagnosed with DM in 2019. Has been on victoza and Januvia .Hemoglobin A1c has ranged from  10.5% in 2020, peaking at 12.8% in 2019.   ON his initial visit to our clinic his A1c was 10.5%. He was on Basaglar and Metformin. Ozempic was added. Basaglar was discontinued 11/2018 due to hypoglycemia.   Insulin restarted 06/2020 due to hyperglycemia and an A1c 10.8%   Works a 3rd shift 8 PM to 6 AM   [employer]   Eats 3 pm, 10 pm and 1 AM    Pt had normal cardiac cath 09/16/2019 SUBJECTIVE:   During the last visit (04/03/2020):  A1c was 7.5% . We continued Metformin and increased Ozempic.        Today (07/06/2020): Phillip Frye is here for a follow up on diabetes and recent ED visit for severe hyperglycemia with a serum glucose 574 mg/dL on 07/05/2020.    He checks his blood sugars 1 times daily. Denies nausea but has diarrhea  Denies polydipsia but has polyuria       HOME DIABETES REGIMEN:  Ozempic 1 mg weekly  Metformin 1000 mg BID       METER DOWNLOAD SUMMARY:Did not bring    HISTORY:  Past Medical History:  Past Medical History:  Diagnosis Date  . Arthritis   . Atrial fibrillation ([place])   . Diabetes mellitus without complication ([place])   . Dysrhythmia    unknown type  . GERD (gastroesophageal reflux disease)   . Hypertension    MD stopped BP med 1 yr ago due to normal BP  . Obesity   . Pulmonary embolism, bilateral ([place])  09/2012   on xarelto  . Shortness of breath    with exertion  . Sleep apnea    uses c pap    Past Surgical History:  Past Surgical History:  Procedure Laterality Date  . BREATH TEK H PYLORI N/A 12/28/2012   Procedure: BREATH TEK H PYLORI;  Kirchhoff: Gayland Curry, MD;  Location: [facility];  Service: General;  Laterality: N/A;  . HIP SURGERY Bilateral    to "shave gthe bones"  . KNEE ARTHROSCOPY WITH ANTERIOR CRUCIATE LIGAMENT (ACL) REPAIR Left 01/29/2019   Procedure: KNEE ARTHROSCOPY WITH CHONDROPLASTY AND  ANTERIOR CRUCIATE LIGAMENT (ACL) RPAIR WITH [place];  Pribyl: Dorna Leitz, MD;  Location: [facility];  Service: Orthopedics;  Laterality: Left;  . LAPAROSCOPIC GASTRIC SLEEVE RESECTION N/A 05/31/2013   Procedure: LAPAROSCOPIC GASTRIC SLEEVE RESECTION;  Giddings: Gayland Curry, MD;  Location: [facility];  Service: General;  Laterality: N/A;  . LEFT HEART CATH AND CORONARY ANGIOGRAPHY N/A 09/16/2019   Procedure: LEFT HEART CATH AND CORONARY ANGIOGRAPHY;  Patricelli: Charolette Forward, MD;  Location: [facility];  Service: Cardiovascular;  Laterality: N/A;    Social History:  reports that he has been smoking cigars. He has a 6.00 pack-year smoking history. He has never used smokeless tobacco. He reports  current alcohol use. He reports that he does not use drugs. Family History:  Family History  Problem Relation Age of Onset  . Cirrhosis Father   . Diabetes Other   . Diabetes Other   . Emphysema Paternal Grandfather        smoked     HOME MEDICATIONS: Allergies as of 07/06/2020   No Known Allergies     Medication List       Accurate as of Jul 06, 2020  7:11 AM. If you have any questions, ask your nurse or doctor.        amiodarone 200 MG tablet Commonly known as: PACERONE Take 200 mg by mouth daily.   aspirin 81 MG chewable tablet Chew 81 mg by mouth daily. Take 1 tablet by mouth once daily, and may take one in the pm if check pain.   atorvastatin 10 MG  tablet Commonly known as: LIPITOR TAKE 1 TABLET (10 MG TOTAL) BY MOUTH DAILY AT 6 PM. What changed: when to take this   blood glucose meter kit and supplies Dispense based on patient and insurance preference. Use up to four times daily as directed. (FOR ICD-10 E10.9, E11.9).   Eliquis 5 MG Tabs tablet Generic drug: apixaban TAKE 1 TABLET BY MOUTH 2 TIMES DAILY FOR 30 DAYS What changed: See the new instructions.   freestyle lancets   furosemide 40 MG tablet Commonly known as: LASIX Take 40 mg by mouth daily.   glucose blood test strip Up to 3 times per day - DM2 with hyperglycemia. Uncontrolled.   Klor-Con 10 10 MEQ tablet Generic drug: potassium chloride Take 10 mEq by mouth daily.   losartan 100 MG tablet Commonly known as: COZAAR Take 100 mg by mouth daily.   metFORMIN 1000 MG tablet Commonly known as: GLUCOPHAGE TAKE 1 TABLET (1,000 MG TOTAL) BY MOUTH 2 (TWO) TIMES DAILY WITH A MEAL.   metoprolol succinate 100 MG 24 hr tablet Commonly known as: TOPROL-XL Take 100 mg by mouth in the morning.   metoprolol succinate 50 MG 24 hr tablet Commonly known as: TOPROL-XL Take 50 mg by mouth every evening.   Ozempic (1 MG/DOSE) 4 MG/3ML Sopn Generic drug: Semaglutide (1 MG/DOSE) Inject 1 mg into the skin once a week. What changed: additional instructions   Skyrizi Pen 150 MG/ML Soaj Generic drug: Risankizumab-rzaa Inject 150 mg into the skin every 3 (three) months.   UNABLE TO FIND Med Name: [place] meter   vitamin B-12 500 MCG tablet Commonly known as: CYANOCOBALAMIN Take 500 mcg by mouth daily.        OBJECTIVE:   Vital Signs: BP 138/78   Pulse 82   Ht _0  (1.88 m)   Wt (!) 381 lb 6 oz (173 kg)   SpO2 98%   BMI 48.97 kg/m   Wt Readings from Last 3 Encounters:  07/04/20 (!) 370 lb (167.8 kg)  04/03/20 (!) 385 lb 4 oz (174.7 kg)  09/27/19 (!) 364 lb 12.8 oz (165.5 kg)     Exam: General: Pt appears well and is in NAD  Lungs: Clear with good BS  bilat   Heart: RRR  Extremities: Trace edema   Neuro: MS is good with appropriate affect, pt is alert and Ox3   DM foot exam: 05/17/2019  The skin of the feet is intact without sores or ulcerations. The pedal pulses are 2+ on right and 2+ on left. The sensation is decreased on the left  to a screening 5.07, 10  gram monofilament      DATA REVIEWED:  Lab Results  Component Value Date   HGBA1C 7.5 (A) 04/03/2020   HGBA1C 6.1 (A) 09/27/2019   HGBA1C 6.4 (H) 04/22/2019   Lab Results  Component Value Date   LDLCALC 62 04/22/2019   CREATININE 1.35 (H) 07/05/2020   Lab Results  Component Value Date   MICRALBCREAT 5 04/22/2019     Lab Results  Component Value Date   CHOL 114 04/22/2019   HDL 30 (L) 04/22/2019   LDLCALC 62 04/22/2019   TRIG 124 04/22/2019   CHOLHDL 3.8 04/22/2019         ASSESSMENT / PLAN / RECOMMENDATIONS:   1) Type 2 Diabetes Mellitus,Poorly controlled, Without complications - Most recent A1c of 10.9  %. Goal A1c < 7.0 %.     - Pt has worsening glycemic control despite increasing his Ozempic, he attributes this to stress and dietary indiscretions.  - Will restart insulin as below  - We discussed importance of low carb diet and avoiding sugar sweetened beverages  - In office Bg 339 mg/dL   MEDICATIONS:  -Continue Ozempic 1 mg weekly -Continue  Metformin 1000  Twice a day with meals - Start Toujeo 36 units daily   EDUCATION / INSTRUCTIONS:  BG monitoring instructions: Patient is instructed to check his blood sugars 1 times a day, fasting  Call [facility] if: BG persistently < 70  . I reviewed the Rule of 15 for the treatment of hypoglycemia in detail with the patient. Literature supplied.    F/U in 3 months    Signed electronically by: Mack Guise, MD  [facility]  [facility] [address] [place], [place] [address] Phone: [phone] FAX: [phone]   CC: System,  Provider Not In No address on file Phone: None  Fax: None  Return to Endocrinology clinic as below: Future Appointments  Date Time Provider [place]  07/06/2020 10:10 AM Amylia Collazos, Melanie Crazier, MD [facility] None  09/28/2020  2:00 PM Ward Givens, NP [facility] None  10/02/2020  7:30 AM Jaine Estabrooks, Melanie Crazier, MD [facility] None

## 2020-08-08 DIAGNOSIS — J353 Hypertrophy of tonsils with hypertrophy of adenoids: Secondary | ICD-10-CM | POA: Diagnosis not present

## 2020-08-08 DIAGNOSIS — G4733 Obstructive sleep apnea (adult) (pediatric): Secondary | ICD-10-CM | POA: Diagnosis not present

## 2020-08-08 DIAGNOSIS — J3503 Chronic tonsillitis and adenoiditis: Secondary | ICD-10-CM | POA: Diagnosis not present

## 2020-08-10 ENCOUNTER — Other Ambulatory Visit: Payer: Self-pay | Admitting: Otolaryngology

## 2020-09-13 DIAGNOSIS — I10 Essential (primary) hypertension: Secondary | ICD-10-CM | POA: Diagnosis not present

## 2020-09-13 DIAGNOSIS — E785 Hyperlipidemia, unspecified: Secondary | ICD-10-CM | POA: Diagnosis not present

## 2020-09-13 DIAGNOSIS — E119 Type 2 diabetes mellitus without complications: Secondary | ICD-10-CM | POA: Diagnosis not present

## 2020-09-13 DIAGNOSIS — I480 Paroxysmal atrial fibrillation: Secondary | ICD-10-CM | POA: Diagnosis not present

## 2020-09-21 ENCOUNTER — Ambulatory Visit: Payer: 59 | Admitting: Adult Health

## 2020-09-22 DIAGNOSIS — I480 Paroxysmal atrial fibrillation: Secondary | ICD-10-CM | POA: Diagnosis not present

## 2020-09-22 DIAGNOSIS — E785 Hyperlipidemia, unspecified: Secondary | ICD-10-CM | POA: Diagnosis not present

## 2020-09-22 DIAGNOSIS — E119 Type 2 diabetes mellitus without complications: Secondary | ICD-10-CM | POA: Diagnosis not present

## 2020-09-22 DIAGNOSIS — I10 Essential (primary) hypertension: Secondary | ICD-10-CM | POA: Diagnosis not present

## 2020-09-28 ENCOUNTER — Ambulatory Visit: Payer: 59 | Admitting: Adult Health

## 2020-10-02 ENCOUNTER — Ambulatory Visit: Payer: BC Managed Care – PPO | Admitting: Internal Medicine

## 2020-10-02 ENCOUNTER — Other Ambulatory Visit: Payer: Self-pay

## 2020-10-02 VITALS — BP 150/80 | HR 80 | Wt 390.4 lb

## 2020-10-02 DIAGNOSIS — E785 Hyperlipidemia, unspecified: Secondary | ICD-10-CM | POA: Diagnosis not present

## 2020-10-02 DIAGNOSIS — Z794 Long term (current) use of insulin: Secondary | ICD-10-CM | POA: Diagnosis not present

## 2020-10-02 DIAGNOSIS — E1165 Type 2 diabetes mellitus with hyperglycemia: Secondary | ICD-10-CM | POA: Diagnosis not present

## 2020-10-02 LAB — LIPID PANEL
Cholesterol: 108 mg/dL (ref 0–200)
HDL: 28.3 mg/dL — ABNORMAL LOW (ref >39.00)
LDL Cholesterol: 58 mg/dL (ref 0–99)
NonHDL: 79.71
Total CHOL/HDL Ratio: 4
Triglycerides: 111 mg/dL (ref 0.0–149.0)
VLDL: 22.2 mg/dL (ref 0.0–40.0)

## 2020-10-02 LAB — BASIC METABOLIC PANEL
BUN: 17 mg/dL (ref 6–23)
CO2: 29 mEq/L (ref 19–32)
Calcium: 9.5 mg/dL (ref 8.4–10.5)
Chloride: 102 mEq/L (ref 96–112)
Creatinine, Ser: 1.26 mg/dL (ref 0.40–1.50)
GFR: 65.85 mL/min (ref >60.00)
Glucose, Bld: 101 mg/dL — ABNORMAL HIGH (ref 70–99)
Potassium: 4 mEq/L (ref 3.5–5.1)
Sodium: 139 mEq/L (ref 135–145)

## 2020-10-02 LAB — MICROALBUMIN / CREATININE URINE RATIO
Creatinine,U: 378.5 mg/dL
Microalb Creat Ratio: 0.3 mg/g (ref 0.0–30.0)
Microalb, Ur: 1.2 mg/dL (ref 0.0–1.9)

## 2020-10-02 LAB — POCT GLYCOSYLATED HEMOGLOBIN (HGB A1C): Hemoglobin A1C: 8.2 % — AB (ref 4.0–5.6)

## 2020-10-02 MED ORDER — FREESTYLE LIBRE 2 SENSOR MISC: 1.0000 | 11 refills | Status: DC

## 2020-10-02 MED ORDER — FREESTYLE LIBRE 2 READER DEVI: 1.0000 | 0 refills | Status: DC

## 2020-10-02 MED ORDER — OZEMPIC (2 MG/DOSE) 8 MG/3ML ~~LOC~~ SOPN: 2.0000 mg | PEN_INJECTOR | SUBCUTANEOUS | 3 refills | Status: DC

## 2020-10-02 MED ORDER — TOUJEO MAX SOLOSTAR 300 UNIT/ML ~~LOC~~ SOPN: 40.0000 [IU] | PEN_INJECTOR | Freq: Every day | SUBCUTANEOUS | 6 refills | Status: DC

## 2020-10-02 NOTE — Progress Notes (Signed)
Name: Phillip Frye  Age/ Sex: 50 y.o., male   MRN/ DOB: [id number], 12/06/1970     PCP: Janie Morning, DO   Reason for Endocrinology Evaluation: Type 2 Diabetes Mellitus  Initial Endocrine Consultative Visit: 10/28/2018    PATIENT IDENTIFIER: Phillip Frye is a 50 y.o. male with a past medical history of HTN, T2DM,A. Fib , OSA and PE. The patient has followed with Endocrinology clinic since 10/28/2018 for consultative assistance with management of his diabetes.     DIABETIC HISTORY:  Phillip Frye was diagnosed with DM in 2019. Has been on victoza and Januvia .Hemoglobin A1c has ranged from  10.5% in 2020, peaking at 12.8% in 2019.   ON his initial visit to our clinic his A1c was 10.5%. He was on Basaglar and Metformin. Ozempic was added. Basaglar was discontinued 11/2018 due to hypoglycemia.   Insulin restarted 06/2020 due to hyperglycemia and an A1c 10.8%   Works a 3rd shift 8 PM to 6 AM   [employer]    Eats 3 pm, 10 pm and 1 AM    Pt had normal cardiac cath 09/16/2019 SUBJECTIVE:   During the last visit (07/06/2020):  A1c was 10.9% . We continued Ozempic and metformin and started basal insulin      Today (10/02/2020): Phillip Frye is here for a follow up on diabetes and recent ED visit for severe hyperglycemia with a serum glucose 574 mg/dL on 07/05/2020.  Pending tonsillectomy 10/25/2020   Saw cardiology - changed BP meds due to hypertension - does not recall changes at this time   Denies nausea or vomiting Had one episode off diarrhea     HOME DIABETES REGIMEN:  Ozempic 1 mg weekly  Metformin 1000 mg BID  Toujeo 36 units daily - he is not sure between 30-38 units       METER DOWNLOAD SUMMARY:Did not bring      DIABETIC COMPLICATIONS: Microvascular complications:    Denies: CKD, neuropathy , retinopathy  Last eye exam: Completed 2020   Macrovascular complications:    Denies: CAD, PVD, CVA      HISTORY:  Past Medical  History:  Past Medical History:  Diagnosis Date   Arthritis    Atrial fibrillation ([place])    Diabetes mellitus without complication ([place])    Dysrhythmia    unknown type   GERD (gastroesophageal reflux disease)    Hypertension    MD stopped BP med 1 yr ago due to normal BP   Obesity    Pulmonary embolism, bilateral ([place]) 09/2012   on xarelto   Shortness of breath    with exertion   Sleep apnea    uses c pap    Past Surgical History:  Past Surgical History:  Procedure Laterality Date   BREATH TEK H PYLORI N/A 12/28/2012   Procedure: BREATH TEK H PYLORI;  Scarpelli: Gayland Curry, MD;  Location: [facility];  Service: General;  Laterality: N/A;   HIP SURGERY Bilateral    to "shave gthe bones"   KNEE ARTHROSCOPY WITH ANTERIOR CRUCIATE LIGAMENT (ACL) REPAIR Left 01/29/2019   Procedure: KNEE ARTHROSCOPY WITH CHONDROPLASTY AND  ANTERIOR CRUCIATE LIGAMENT ([place]) RPAIR WITH [place];  Sing: Dorna Leitz, MD;  Location: [facility];  Service: Orthopedics;  Laterality: Left;   LAPAROSCOPIC GASTRIC SLEEVE RESECTION N/A 05/31/2013   Procedure: LAPAROSCOPIC GASTRIC SLEEVE RESECTION;  [place]le: Gayland Curry, MD;  [facility];  Service: General;  Laterality: N/A;   LEFT HEART  CATH AND CORONARY ANGIOGRAPHY N/A 09/16/2019   Procedure: LEFT HEART CATH AND CORONARY ANGIOGRAPHY;  [place]ark: Charolette Forward, MD;  Location: [facility];  Service: Cardiovascular;  Laterality: N/A;   Social History:  reports that he has been smoking cigars. He has a 6.00 pack-year smoking history. He has never used smokeless tobacco. He reports current alcohol use. He reports that he does not use drugs. Family History:  Family History  Problem Relation Age of Onset   Cirrhosis Father    Diabetes Other    Diabetes Other    Emphysema Paternal Grandfather        smoked     HOME MEDICATIONS: Allergies as of 10/02/2020   No Known Allergies      Medication List        Accurate as of October 02, 2020  8:27 AM. If you have any questions, ask your nurse or doctor.          STOP taking these medications    Ozempic (1 MG/DOSE) 4 MG/3ML Sopn Generic drug: Semaglutide (1 MG/DOSE) Replaced by: Ozempic (2 MG/DOSE) 8 MG/3ML Sopn Stopped by: Dorita Sciara, MD       TAKE these medications    amiodarone 200 MG tablet Commonly known as: PACERONE Take 200 mg by mouth daily.   aspirin 81 MG chewable tablet Chew 81 mg by mouth daily. Take 1 tablet by mouth once daily, and may take one in the pm if check pain.   atorvastatin 10 MG tablet Commonly known as: LIPITOR TAKE 1 TABLET (10 MG TOTAL) BY MOUTH DAILY AT 6 PM. What changed: when to take this   blood glucose meter kit and supplies Dispense based on patient and insurance preference. Use up to four times daily as directed. (FOR ICD-10 E10.9, E11.9).   Eliquis 5 MG Tabs tablet Generic drug: apixaban TAKE 1 TABLET BY MOUTH 2 TIMES DAILY FOR 30 DAYS What changed: See the new instructions.   freestyle lancets   FreeStyle Libre 2 Reader Devi 1 Device by Does not apply route as directed. Started by: Dorita Sciara, MD   FreeStyle Libre 2 Sensor Misc 1 Device by Does not apply route every 14 (fourteen) days. Started by: Dorita Sciara, MD   furosemide 40 MG tablet Commonly known as: LASIX Take 40 mg by mouth daily.   glucose blood test strip Up to 3 times per day - DM2 with hyperglycemia. Uncontrolled.   Insulin Pen Needle 32G X 4 MM Misc 1 Device by Does not apply route daily.   Klor-Con 10 10 MEQ tablet Generic drug: potassium chloride Take 10 mEq by mouth daily.   losartan 100 MG tablet Commonly known as: COZAAR Take 100 mg by mouth daily.   metFORMIN 1000 MG tablet Commonly known as: GLUCOPHAGE TAKE 1 TABLET (1,000 MG TOTAL) BY MOUTH 2 (TWO) TIMES DAILY WITH A MEAL.   metoprolol succinate 100 MG 24 hr tablet Commonly known as: TOPROL-XL Take 100 mg by mouth in the morning.    metoprolol succinate 50 MG 24 hr tablet Commonly known as: TOPROL-XL Take 50 mg by mouth every evening.   Ozempic (2 MG/DOSE) 8 MG/3ML Sopn Generic drug: Semaglutide (2 MG/DOSE) Inject 2 mg into the skin once a week. Replaces: Ozempic (1 MG/DOSE) 4 MG/3ML Sopn Started by: Dorita Sciara, MD   Skyrizi Pen 150 MG/ML Soaj Generic drug: Risankizumab-rzaa Inject 150 mg into the skin every 3 (three) months.   Toujeo Max SoloStar 300 UNIT/ML  Solostar Pen Generic drug: insulin glargine (2 Unit Dial) Inject 40 Units into the skin daily. What changed: how much to take Changed by: Dorita Sciara, MD   [place] Name: Anitra Lauth meter   vitamin B-12 500 MCG tablet Commonly known as: CYANOCOBALAMIN Take 500 mcg by mouth daily.         OBJECTIVE:   Vital Signs: BP (!) 150/80   Pulse 80   Wt (!) 390 lb 6.4 oz (177.1 kg)   SpO2 97%   BMI 50.12 kg/m   Wt Readings from Last 3 Encounters:  10/02/20 (!) 390 lb 6.4 oz (177.1 kg)  07/06/20 (!) 381 lb 6 oz (173 kg)  07/04/20 (!) 370 lb (167.8 kg)     Exam: General: Pt appears well and is in NAD  Lungs: Clear with good BS bilat   Heart: RRR  Extremities: Trace edema   Neuro: MS is good with appropriate affect, pt is alert and Ox3   DM foot exam: 05/17/2019   The skin of the feet is intact without sores or ulcerations. The pedal pulses are 2+ on right and 2+ on left. The sensation is decreased on the left  to a screening 5.07, 10 gram monofilament      DATA REVIEWED:  Lab Results  Component Value Date   HGBA1C 10.9 (A) 07/06/2020   HGBA1C 7.5 (A) 04/03/2020   HGBA1C 6.1 (A) 09/27/2019   Results for BOGDAN, VIVONA (MRN [id number]) as of 10/03/2020 13:52  Ref. Range 10/02/2020 08:13 10/02/2020 08:30  Sodium Latest Ref Range: 135 - 145 mEq/L 139   Potassium Latest Ref Range: 3.5 - 5.1 mEq/L 4.0   Chloride Latest Ref Range: 96 - 112 mEq/L 102   CO2 Latest Ref Range: 19 - 32 mEq/L 29   Glucose Latest  Ref Range: 70 - 99 mg/dL 101 (H)   BUN Latest Ref Range: 6 - 23 mg/dL 17   Creatinine Latest Ref Range: 0.40 - 1.50 mg/dL 1.26   Calcium Latest Ref Range: 8.4 - 10.5 mg/dL 9.5   GFR Latest Ref Range: >60.00 mL/min 65.85   Total CHOL/HDL Ratio Unknown 4   Cholesterol Latest Ref Range: 0 - 200 mg/dL 108   HDL Cholesterol Latest Ref Range: >39.00 mg/dL 28.30 (L)   LDL (calc) Latest Ref Range: 0 - 99 mg/dL 58   MICROALB/CREAT RATIO Latest Ref Range: 0.0 - 30.0 mg/g 0.3   NonHDL Unknown 79.71   Triglycerides Latest Ref Range: 0.0 - 149.0 mg/dL 111.0   VLDL Latest Ref Range: 0.0 - 40.0 mg/dL 22.2   ALDOSTERONE Unknown WILL FOLLOW   Renin Unknown WILL FOLLOW   ALDOS/RENIN RATIO Unknown WILL FOLLOW   Hemoglobin A1C Latest Ref Range: 4.0 - 5.6 %  8.2 (A)  TSH Latest Ref Range: 0.450 - 4.500 uIU/mL 0.935   T4,Free(Direct) Unknown WILL FOLLOW   Creatinine,U Latest Units: mg/dL 378.5   Microalb, Ur Latest Ref Range: 0.0 - 1.9 mg/dL 1.2     24- hr urinary cortisol pending    ASSESSMENT / PLAN / RECOMMENDATIONS:   1) Type 2 Diabetes Mellitus,Poorly controlled, Without complications - Most recent A1c of 8.2   %. Goal A1c < 7.0 %.      - A1c trending down from 10.9 %  - He was not clear on how much insulin he has been taking, initially stated 30 units then 38 units ?   - Freestyle libre sent  - Will make the following changes  MEDICATIONS:  -Increase  Ozempic to 2 mg weekly -Continue  Metformin 1000  Twice a day with meals - Increase Toujeo to 40  units daily   EDUCATION / INSTRUCTIONS: BG monitoring instructions: Patient is instructed to check his blood sugars 1 times a day, fasting Call [facility] if: BG persistently < 70  I reviewed the Rule of 15 for the treatment of hypoglycemia in detail with the patient. Literature supplied.  2) Diabetic complications:  Eye: Does not have known diabetic retinopathy.  Neuro/ Feet: Does not have known diabetic peripheral  neuropathy. Renal: Patient does not have known baseline CKD. He is on an ACEI/ARB at present.  3) HTN:   - This is out of control. He was recently seen by cardiology and medications adjusted. He is supposed to be on Metoprolol QAM and QPM but he admits to forgetting the evening dose.   - I am going to screen him for cushing syndrome and hyperaldo.    4) Dyslipidemia :   - LDL at goal as well as Tg  - NO change    Medication  Continue Atorvastatin 10 mg daily   F/U in 4 months    Signed electronically by: Mack Guise, MD  [facility] End[facility]in[facility]  [facility] [address] [place], [place] [address] Phone: [phone] FAX: [phone]   CC: Janie Morning, [address] [place] [address] Phone: [phone]  Fax: [phone]  Return to Endocrinology clinic as below: Future Appointments  Date Time Provider [place]  11/23/2020 10:30 AM Ward Givens, NP [facility] None  01/25/2021  7:30 AM Andreas Sobolewski, Melanie Crazier, MD [facility] None

## 2020-10-02 NOTE — Patient Instructions (Addendum)
-  Increase Ozempic 2 mg weekly -Continue  Metformin 1000  Twice a day with meals - Increase Toujeo to 40  units daily     HOW TO TREAT LOW BLOOD SUGARS (Blood sugar LESS THAN 70 MG/DL) Please follow the RULE OF 15 for the treatment of hypoglycemia treatment (when your (blood sugars are less than 70 mg/dL)   STEP 1: Take 15 grams of carbohydrates when your blood sugar is low, which includes:  3-4 GLUCOSE TABS  OR 3-4 OZ OF JUICE OR REGULAR SODA OR ONE TUBE OF GLUCOSE GEL    STEP 2: RECHECK blood sugar in 15 MINUTES STEP 3: If your blood sugar is still low at the 15 minute recheck --> then, go back to STEP 1 and treat AGAIN with another 15 grams of carbohydrates.    24-Hour Urine Collection  You will be collecting your urine for a 24-hour period of time. Your timer starts with your first urine of the morning (For example - If you first pee at 9AM, your timer will start at 9AM) Throw away your first urine of the morning Collect your urine every time you pee for the next 24 hours STOP your urine collection 24 hours after you started the collection (For example - You would stop at 9AM the day after you started)

## 2020-10-03 ENCOUNTER — Encounter: Payer: Self-pay | Admitting: Internal Medicine

## 2020-10-04 ENCOUNTER — Other Ambulatory Visit: Payer: Self-pay

## 2020-10-04 ENCOUNTER — Other Ambulatory Visit: Payer: BC Managed Care – PPO

## 2020-10-08 LAB — ALDOSTERONE + RENIN ACTIVITY W/ RATIO
ALDOS/RENIN RATIO: 2.1 (ref 0.0–30.0)
ALDOSTERONE: 3.3 ng/dL (ref 0.0–30.0)
Renin: 1.535 ng/mL/hr (ref 0.167–5.380)

## 2020-10-08 LAB — T4, FREE: Free T4: 1.23 ng/dL (ref 0.82–1.77)

## 2020-10-08 LAB — TSH: TSH: 0.935 u[IU]/mL (ref 0.450–4.500)

## 2020-10-17 NOTE — Pre-Procedure Instructions (Signed)
Surgical Instructions:    Your procedure is scheduled on Wednesday, September 14th.  Report to [facility] Main Entrance "A" at 08:00 A.M., then check in with the Admitting office.  Call this number if you have problems the morning of surgery:  [phone]   If you have any questions prior to your surgery date call [phone]: Open Monday-Friday 8am-4pm    Remember:  Do not eat or drink after midnight the night before your surgery.    Take these medicines the morning of surgery with A SIP OF WATER: amiodarone (PACERONE) metoprolol succinate (TOPROL-XL)   >>Follow your [place]'s instructions on when to stop ELIQUIS.  If no instructions were given by your [place] then you will need to call the office to get those instructions.      As of today, STOP taking any Aspirin (unless otherwise instructed by your D[place]) Aleve, Naproxen, Ibuprofen, Motrin, Advil, Goody's, BC's, all herbal medications, fish oil, and all vitamins.   WHAT DO I DO ABOUT MY DIABETES MEDICATION?  THE DAY BEFORE SURGERY: TAKE YOUR USUAL DOSE OF metFORMIN (GLUCOPHAGE) and insulin glargine (TOUJEO MAX SOLOSTAR).   THE MORNING OF SURGERY:  Take 15_ units of insulin glargine (TOUJEO MAX SOLOSTAR).  Do not take metFORMIN (GLUCOPHAGE).  The day of surgery, do not take other diabetes injectables, including Semaglutide (OZEMPIC)     HOW TO MANAGE YOUR DIABETES BEFORE AND AFTER SURGERY  Why is it important to control my blood sugar before and after surgery? Improving blood sugar levels before and after surgery helps healing and can limit problems. A way of improving blood sugar control is eating a healthy diet by:  Eating less sugar and carbohydrates  Increasing activity/exercise  Talking with your doctor about reaching your blood sugar goals High blood sugars (greater than 180 mg/dL) can raise your risk of infections and slow your recovery, so you will need to focus on controlling your diabetes during the  weeks before surgery. Make sure that the doctor who takes care of your diabetes knows about your planned surgery including the date and location.  How do I manage my blood sugar before surgery? Check your blood sugar at least 4 times a day, starting 2 days before surgery, to make sure that the level is not too high or low.  Check your blood sugar the morning of your surgery when you wake up and every 2 hours until you get to the Short Stay unit.  If your blood sugar is less than 70 mg/dL, you will need to treat for low blood sugar: Do not take insulin. Treat a low blood sugar (less than 70 mg/dL) with  cup of clear juice (cranberry or apple), 4 glucose tablets, OR glucose gel. Recheck blood sugar in 15 minutes after treatment (to make sure it is greater than 70 mg/dL). If your blood sugar is not greater than 70 mg/dL on recheck, call [phone] for further instructions. Report your blood sugar to the short stay nurse when you get to Short Stay.  If you are admitted to the hospital after surgery: Your blood sugar will be checked by the staff and you will probably be given insulin after surgery (instead of oral diabetes medicines) to make sure you have good blood sugar levels. The goal for blood sugar control after surgery is 80-180 mg/dL.            Do not wear jewelry. Do not wear lotions, powders, colognes, or deodorant. Men may shave face and neck. Do not bring  valuables to the hospital.  [facility] is not responsible for any belongings or valuables.  Do NOT Smoke (Tobacco/Vaping)  24 hours prior to your procedure. If you use a CPAP at night, you may bring all equipment for your overnight stay.   Contacts, glasses, dentures or bridgework may not be worn into surgery, please bring cases for these belongings.   For patients admitted to the hospital, discharge time will be determined by your treatment team.   Patients discharged the day of surgery will not be allowed to drive home,  and someone needs to stay with them for 24 hours.  ONLY 1 SUPPORT PERSON MAY BE PRESENT WHILE YOU ARE IN SURGERY. IF YOU ARE TO BE ADMITTED ONCE YOU ARE IN YOUR ROOM YOU WILL BE ALLOWED TWO (2) VISITORS.  Minor children may have two parents present. Special consideration for safety and communication needs will be reviewed on a case by case basis.  Special instructions:    Oral Hygiene is also important to reduce your risk of infection.  Remember - BRUSH YOUR TEETH THE MORNING OF SURGERY WITH YOUR REGULAR TOOTHPASTE   [place]- Preparing For Surgery  Before surgery, you can play an important role. Because skin is not sterile, your skin needs to be as free of germs as possible. You can reduce the number of germs on your skin by washing with CHG (chlorahexidine gluconate) Soap before surgery.  CHG is an antiseptic cleaner which kills germs and bonds with the skin to continue killing germs even after washing.     Please do not use if you have an allergy to CHG or antibacterial soaps. If your skin becomes reddened/irritated stop using the CHG.  Do not shave (including legs and underarms) for at least 48 hours prior to first CHG shower. It is OK to shave your face.  Please follow these instructions carefully.     Shower the NIGHT BEFORE SURGERY and the MORNING OF SURGERY with CHG Soap.   If you chose to wash your hair, wash your hair first as usual with your normal shampoo. After you shampoo, rinse your hair and body thoroughly to remove the shampoo.  Then [employer] and genitals (private parts) with your normal soap and rinse thoroughly to remove soap.  After that Use CHG Soap as you would any other liquid soap. You can apply CHG directly to the skin and wash gently with a scrungie or a clean washcloth.   Apply the CHG Soap to your body ONLY FROM THE NECK DOWN.  Do not use on open wounds or open sores. Avoid contact with your eyes, ears, mouth and genitals (private parts). Wash Face and  genitals (private parts)  with your normal soap.   Wash thoroughly, paying special attention to the area where your surgery will be performed.  Thoroughly rinse your body with warm water from the neck down.  DO NOT shower/wash with your normal soap after using and rinsing off the CHG Soap.  Pat yourself dry with a CLEAN TOWEL.  Wear CLEAN PAJAMAS to bed the night before surgery  Place CLEAN SHEETS on your bed the night before your surgery  DO NOT SLEEP WITH PETS.   Day of Surgery:  Take a shower with CHG soap. Wear Clean/Comfortable clothing the morning of surgery Do not apply any deodorants/lotions.   Remember to brush your teeth WITH YOUR REGULAR TOOTHPASTE.   Please read over the following fact sheets that you were given.

## 2020-10-18 ENCOUNTER — Encounter (HOSPITAL_COMMUNITY)
Admission: RE | Admit: 2020-10-18 | Discharge: 2020-10-18 | Disposition: A | Discharge status: Home / Self Care | Payer: BC Managed Care – PPO | Source: Ambulatory Visit | Attending: Otolaryngology | Admitting: Otolaryngology

## 2020-10-18 ENCOUNTER — Other Ambulatory Visit: Payer: Self-pay

## 2020-10-18 ENCOUNTER — Encounter (HOSPITAL_COMMUNITY): Payer: Self-pay

## 2020-10-18 DIAGNOSIS — I440 Atrioventricular block, first degree: Secondary | ICD-10-CM | POA: Diagnosis not present

## 2020-10-18 DIAGNOSIS — Z01818 Encounter for other preprocedural examination: Secondary | ICD-10-CM | POA: Diagnosis not present

## 2020-10-18 LAB — CBC
HCT: 44.5 % (ref 39.0–52.0)
Hemoglobin: 14.5 g/dL (ref 13.0–17.0)
MCH: 30.9 pg (ref 26.0–34.0)
MCHC: 32.6 g/dL (ref 30.0–36.0)
MCV: 94.7 fL (ref 80.0–100.0)
Platelets: 276 10*3/uL (ref 150–400)
RBC: 4.7 MIL/uL (ref 4.22–5.81)
RDW: 12.1 % (ref 11.5–15.5)
WBC: 6.5 10*3/uL (ref 4.0–10.5)
nRBC: 0 % (ref 0.0–0.2)

## 2020-10-18 LAB — BASIC METABOLIC PANEL
Anion gap: 6 (ref 5–15)
BUN: 11 mg/dL (ref 6–20)
CO2: 27 mmol/L (ref 22–32)
Calcium: 9.2 mg/dL (ref 8.9–10.3)
Chloride: 104 mmol/L (ref 98–111)
Creatinine, Ser: 1.08 mg/dL (ref 0.61–1.24)
GFR, Estimated: >60 mL/min (ref >60)
Glucose, Bld: 155 mg/dL — ABNORMAL HIGH (ref 70–99)
Potassium: 3.9 mmol/L (ref 3.5–5.1)
Sodium: 137 mmol/L (ref 135–145)

## 2020-10-18 LAB — HEMOGLOBIN A1C
Hgb A1c MFr Bld: 7.8 % — ABNORMAL HIGH (ref 4.8–5.6)
Mean Plasma Glucose: 177.16 mg/dL

## 2020-10-18 NOTE — Pre-Procedure Instructions (Signed)
Surgical Instructions:    Your procedure is scheduled on December 13, 2020.  Report to [facility] Main Entrance "A" at 6:30 A.M., then check in with the Admitting office.  Call this number if you have problems the morning of surgery:  [phone]   If you have any questions prior to your surgery date call [phone]: Open Monday-Friday 8am-4pm    Remember:  Do not eat or drink after midnight the night before your surgery.    Take these medicines the morning of surgery with A SIP OF WATER: amiodarone (PACERONE) metoprolol succinate (TOPROL-XL)   >>Follow your [place]'s instructions on when to stop ELIQUIS.  If no instructions were given by your [place] then you will need to call the office to get those instructions.      As of today, STOP taking any Aspirin (unless otherwise instructed by your [place]) Aleve, Naproxen, Ibuprofen, Motrin, Advil, Goody's, BC's, all herbal medications, fish oil, and all vitamins.   WHAT DO I DO ABOUT MY DIABETES MEDICATION?  THE DAY BEFORE SURGERY: TAKE YOUR USUAL DOSE OF metFORMIN (GLUCOPHAGE) and insulin glargine (TOUJEO MAX SOLOSTAR).   THE MORNING OF SURGERY:  Take 15_ units of insulin glargine (TOUJEO MAX SOLOSTAR).  Do not take metFORMIN (GLUCOPHAGE).  The day of surgery, do not take other diabetes injectables, including Semaglutide (OZEMPIC)     HOW TO MANAGE YOUR DIABETES BEFORE AND AFTER SURGERY  Why is it important to control my blood sugar before and after surgery? Improving blood sugar levels before and after surgery helps healing and can limit problems. A way of improving blood sugar control is eating a healthy diet by:  Eating less sugar and carbohydrates  Increasing activity/exercise  Talking with your doctor about reaching your blood sugar goals High blood sugars (greater than 180 mg/dL) can raise your risk of infections and slow your recovery, so you will need to focus on controlling your diabetes during the weeks  before surgery. Make sure that the doctor who takes care of your diabetes knows about your planned surgery including the date and location.  How do I manage my blood sugar before surgery? Check your blood sugar at least 4 times a day, starting 2 days before surgery, to make sure that the level is not too high or low.  Check your blood sugar the morning of your surgery when you wake up and every 2 hours until you get to the Short Stay unit.  If your blood sugar is less than 70 mg/dL, you will need to treat for low blood sugar: Do not take insulin. Treat a low blood sugar (less than 70 mg/dL) with  cup of clear juice (cranberry or apple), 4 glucose tablets, OR glucose gel. Recheck blood sugar in 15 minutes after treatment (to make sure it is greater than 70 mg/dL). If your blood sugar is not greater than 70 mg/dL on recheck, call [phone] for further instructions. Report your blood sugar to the short stay nurse when you get to Short Stay.  If you are admitted to the hospital after surgery: Your blood sugar will be checked by the staff and you will probably be given insulin after surgery (instead of oral diabetes medicines) to make sure you have good blood sugar levels. The goal for blood sugar control after surgery is 80-180 mg/dL.            Do not wear jewelry. Do not wear lotions, powders, colognes, or deodorant. Men may shave face and neck. Do not bring  valuables to the hospital.  [facility] is not responsible for any belongings or valuables.  Do NOT Smoke (Tobacco/Vaping)  24 hours prior to your procedure. If you use a CPAP at night, you may bring all equipment for your overnight stay.   Contacts, glasses, dentures or bridgework may not be worn into surgery, please bring cases for these belongings.   For patients admitted to the hospital, discharge time will be determined by your treatment team.   Patients discharged the day of surgery will not be allowed to drive home, and  someone needs to stay with them for 24 hours.  ONLY 1 SUPPORT PERSON MAY BE PRESENT WHILE YOU ARE IN SURGERY. IF YOU ARE TO BE ADMITTED ONCE YOU ARE IN YOUR ROOM YOU WILL BE ALLOWED TWO (2) VISITORS.  Minor children may have two parents present. Special consideration for safety and communication needs will be reviewed on a case by case basis.  Special instructions:    Oral Hygiene is also important to reduce your risk of infection.  Remember - BRUSH YOUR TEETH THE MORNING OF SURGERY WITH YOUR REGULAR TOOTHPASTE   [place]- Preparing For Surgery  Before surgery, you can play an important role. Because skin is not sterile, your skin needs to be as free of germs as possible. You can reduce the number of germs on your skin by washing with CHG (chlorahexidine gluconate) Soap before surgery.  CHG is an antiseptic cleaner which kills germs and bonds with the skin to continue killing germs even after washing.     Please do not use if you have an allergy to CHG or antibacterial soaps. If your skin becomes reddened/irritated stop using the CHG.  Do not shave (including legs and underarms) for at least 48 hours prior to first CHG shower. It is OK to shave your face.  Please follow these instructions carefully.     Shower the NIGHT BEFORE SURGERY and the MORNING OF SURGERY with CHG Soap.   If you chose to wash your hair, wash your hair first as usual with your normal shampoo. After you shampoo, rinse your hair and body thoroughly to remove the shampoo.  Then [employer] and genitals (private parts) with your normal soap and rinse thoroughly to remove soap.  After that Use CHG Soap as you would any other liquid soap. You can apply CHG directly to the skin and wash gently with a scrungie or a clean washcloth.   Apply the CHG Soap to your body ONLY FROM THE NECK DOWN.  Do not use on open wounds or open sores. Avoid contact with your eyes, ears, mouth and genitals (private parts). Wash Face and genitals  (private parts)  with your normal soap.   Wash thoroughly, paying special attention to the area where your surgery will be performed.  Thoroughly rinse your body with warm water from the neck down.  DO NOT shower/wash with your normal soap after using and rinsing off the CHG Soap.  Pat yourself dry with a CLEAN TOWEL.  Wear CLEAN PAJAMAS to bed the night before surgery  Place CLEAN SHEETS on your bed the night before your surgery  DO NOT SLEEP WITH PETS.   Day of Surgery:  Take a shower with CHG soap. Wear Clean/Comfortable clothing the morning of surgery Do not apply any deodorants/lotions.   Remember to brush your teeth WITH YOUR REGULAR TOOTHPASTE.   Please read over the following fact sheets that you were given.

## 2020-10-18 NOTE — Progress Notes (Addendum)
PCP: Irena Reichmann, MD Cardiologist: Rinaldo Cloud, MD  EKG: 10/18/20 CXR: 07/05/20 ECHO: 01/25/13 Stress Test: 09/09/19 Cardiac Cath: 09/16/19  Fasting Blood Sugar- 130-250 Checks Blood Sugar__3_ times a day  ASA: No Blood Thinner: Eliquis.  Patient to call office for instructions.   OSA: Yes CPAP: No  Covid test scheduled for December 11, 2020. (Per office, surgery rescheduled for 12/13/20 at 0830).  Anesthesia Review: yes, cardiac history, PE on Eliquis.  Abnormal EKG was reviewed by Fayrene Fearing, PA. Requested last office note and echo from cards.   Patient denies shortness of breath, fever, cough, and chest pain at PAT appointment.  Patient verbalized understanding of instructions provided today at the PAT appointment.  Patient asked to review instructions at home and day of surgery.

## 2020-10-20 ENCOUNTER — Encounter (HOSPITAL_COMMUNITY): Payer: Self-pay

## 2020-10-20 NOTE — Progress Notes (Addendum)
Anesthesia Chart Review:  Case: [id number] Date/Time: 12/13/20 0815   Procedure: TONSILLECTOMY AND ADENOIDECTOMY   Anesthesia type: General   Pre-op diagnosis: ENLARGE TONSILS AND ADNOIDS   Location: [facility] OR ROOM 06 / [facility]   Surgeons: Leta Baptist, MD       DISCUSSION: Patient is a 50 year old male scheduled for the above procedure. Surgery was initially scheduled for 10/25/20, so he had PAT visit on 10/18/20. Since then, surgery has been moved to 12/13/20.    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 BMI is consistent with morbid obesity.  Last endocrinology visit with Dr. Kelton Pillar was on 10/02/20. A1c was down to 8.2% from 10.9%. Ozempic increased to 2 mg weekly, Toujeo increased to 40 units daily, continue metformin 1000 mg BID. BP 150/80, not at goal but sometimes forgetting evening b-blocker. She planned to screen him for Cushing Syndrome and hyperaldosteronism. Aldosterone hormone normal 09/12/20. 24 urine free cortisol is still pending. A1c 10/18/20 was 7.8%.  Last visit with cardiologist Dr. Terrence Dupont on 09/22/20. Reported headaches with labile HTN, but had just started losartan HCT (from losartan). No chest pain or SOB, visual changes or weakness. BP was 125/91 and 125/95. Lasix discontinued. Regular exercise and heart health diet encouraged. Routine follow-up in 3 months planned. He has known inferolateral T wave abnormality. He had no significant CAD by 09/2019 LHC. 05/2019 echo showed normal LVEF and no significant valvular disease, moderate LVH.   Will follow-up with Dr. Deeann Saint office about reason for rescheduling and inquire about input he has received on patient's Eliquis--as patient reported not receiving instructions yet. Although he was treated with rivaroxaban for six months for PE in 2014, he was started on apixaban in 08/2018 when he was diagnosed with  PAF.   ADDENDUM 10/24/20 11:16 AM: I spoke with Timi at Dr. Deeann Saint office. Case was likely postponed due to availability of Coblator. In the meantime, she will reach out to Dr. Terrence Dupont regarding perioperative Eliquis instructions.  I also reviewed 10/02/20 endocrinology note and pending 24 hour urine cortisol test with anesthesiologist Suzette Battiest, MD. Will re-evaluate glucose and BP when he comes in for PAT visit and the day of surgery.      ADDENDUM 12/01/20 5:35 PM:  I received cardiology clearance note from Dr. Terrence Dupont indicating he was cleared for surgery and may hold Eliquis for 3 days.    VS: BP (!) 153/97   Pulse 92   Temp 36.7 C (Oral)   Resp 18   Ht _0  (1.88 m)   Wt (!) 167.5 kg   SpO2 96%   BMI 47.42 kg/m    PROVIDERS: Janie Morning, DO is PCP  Charolette Forward, MD is cardiologist ([facility]) Shamleffer, Elenora Gamma, MD is endocrinologist Star Age, MD is neurologist (OSA)   LABS: Labs on 10/18/20 noted. A1c further decreased at 7.8%.  (all labs ordered are listed, but only abnormal results are displayed)  Labs Reviewed  HEMOGLOBIN A1C - Abnormal; Notable for the following components:      Result Value   Hgb A1c MFr Bld 7.8 (*)    All other components within normal limits  BASIC METABOLIC PANEL - Abnormal; Notable for the following components:   Glucose, Bld 155 (*)    All other components within normal limits  CBC   Per 01/26/18 hematology note, previously hypercoagulable work-up was "(Negative for factor V Leiden gene mutation, prothrombin  gene mutation, lupus anticoagulant, were not detected.  Factor VIII, protein S, protein C, antithrombin III, serum homocysteine, antiphospholipid antibody profile all within normal limits with a mildly elevated  d-dimer of 0.7)."   Splint Night Titration Study 02/03/19: RECOMMENDATIONS:  1. This patient has severe obstructive sleep apnea and responded  well on CPAP therapy. I will  recommend a home treatment pressure  of 16 cm, as he had a significant, albeit brief, desaturation to  80% during non-supine REM sleep on the final titration pressure  of 15 cm...   IMAGES: CXR 07/05/20: FINDINGS: No focal consolidation, pleural effusion, or pneumothorax. There is mild cardiomegaly with mild central vascular congestion. No acute osseous pathology. IMPRESSION: Mild cardiomegaly with mild central vascular congestion. No focal consolidation.   EKG: 10/18/20:  Sinus rhythm with 1st degree A-V block Possible Left atrial enlargement ST & T wave abnormality, consider inferolateral ischemia Prolonged QT Abnormal ECG Confirmed by Sherren Mocha [id number]) on 10/18/2020 3:14:20 PM - He also had inferolateral T wave inversion on 09/16/19 tracing.   CV: Cardiac cath 09/16/19:  Findings LV showed good LV systolic function LVH with EF of 55 to 60%. Left main was patent LAD was patent Diagonal 1 and 2 were patent Left circumflex was large which was patent OM1 and OM 2 very large which were patent RCA was patent PDA and PLV branches were patent. Plan Medical management.   Nuclear stress test 09/09/19: IMPRESSION: 1. Despite artifact seen on stress imaging, a moderate reversible defect involving the distal anterior, lateral, and apical walls of left ventricle is suspicious for inducible myocardial ischemia. 2. Left ventricular apical wall hypokinesis. 3. Left ventricular ejection fraction 46% 4. Non invasive risk stratification*: Intermediate [Referred for LHC that showed patent coronaries on 09/16/19]    Echo 05/28/19 ([facility]): Findings: - Left Ventricle: LV size is normal.  LV systolic function is normal, EF estimated greater than 55%.  LV diastolic function is normal.  Moderate concentric LVH.  Overall wall motion is normal.  LV function is normal.  Estimated EF is 50 to 55%. - Right Ventricle: RV size is normal.  RV function is normal.  No RV hypertrophy  present.  No abnormalities visualized in RV. - Ventricular septum: IVS is normal. - Left atrium: LA size is mildly enlarged. - Right Atrium: RV size is normal.  No abnormalities visualized in RA. - Atrial septum: Atrial septum is normal. - Aortic: Aorta is normal. - Pericardial fusion: Pericardium is normal. - Aortic valve: No AV abnormalities noted.  - Mitral valve: Trace mitral regurgitation noted. - Pulmonic valve: No PV abnormalities noted. - Tricuspid valve: Trace tricuspid regurgitation noted. Summary: - Normal left ventricular size.  Left ventricular systolic function was normal with ejection fraction as documented above. The left ventricle shows normal contractility and size.  Left ventricular hypertrophy is seen with normal diastolic function noted. - Trace mitral regurgitation is seen. - Trace tricuspid regurgitation is seen. - No intracardiac shunting is found. - No intracardiac masses or thrombi are seen. - No pericardial effusion is seen with no suggestion of pericardial tamponade.   Past Medical History:  Diagnosis Date   Arthritis    Atrial fibrillation ([place])    Diabetes mellitus without complication ([place])    Dysrhythmia    unknown type   GERD (gastroesophageal reflux disease)    Hypertension    MD stopped BP med 1 yr ago due to normal BP   Obesity    Pulmonary embolism, bilateral ([place]) 09/2012  on xarelto   Shortness of breath    with exertion   Sleep apnea    uses c pap     Past Surgical History:  Procedure Laterality Date   BREATH TEK H PYLORI N/A 12/28/2012   Procedure: BREATH TEK H PYLORI;  [place]er: Gayland Curry, MD;  Location: [facility];  Service: General;  Laterality: N/A;   HIP SURGERY Bilateral    to "shave gthe bones"   KNEE ARTHROSCOPY WITH ANTERIOR CRUCIATE LIGAMENT (ACL) REPAIR Left 01/29/2019   Procedure: KNEE ARTHROSCOPY WITH CHONDROPLASTY AND  ANTERIOR CRUCIATE LIGAMENT (ACL) RPAIR WITH [place];  [place]vin: Dorna Leitz, MD;  Location:  [facility];  Service: Orthopedics;  Laterality: Left;   LAPAROSCOPIC GASTRIC SLEEVE RESECTION N/A 05/31/2013   Procedure: LAPAROSCOPIC GASTRIC SLEEVE RESECTION;  Strahm: Gayland Curry, MD;  Location: [facility];  Service: General;  Laterality: N/A;   LEFT HEART CATH AND CORONARY ANGIOGRAPHY N/A 09/16/2019   Procedure: LEFT HEART CATH AND CORONARY ANGIOGRAPHY;  [place]: Charolette Forward, MD;  Location: [facility];  Service: Cardiovascular;  Laterality: N/A;    MEDICATIONS:  amiodarone (PACERONE) 200 MG tablet   atorvastatin (LIPITOR) 10 MG tablet   blood glucose meter kit and supplies   Cinnamon 500 MG TABS   Continuous Blood Gluc Receiver (FREESTYLE LIBRE 2 READER) DEVI   Continuous Blood Gluc Sensor (FREESTYLE LIBRE 2 SENSOR) MISC   ELIQUIS 5 MG TABS tablet   glucose blood test strip   hydrochlorothiazide (HYDRODIURIL) 25 MG tablet   insulin glargine, 2 Unit Dial, (TOUJEO MAX SOLOSTAR) 300 UNIT/ML Solostar Pen   Insulin Pen Needle 32G X 4 MM MISC   KLOR-CON 10 10 MEQ tablet   Lancets (FREESTYLE) lancets   losartan (COZAAR) 100 MG tablet   metFORMIN (GLUCOPHAGE) 1000 MG tablet   metoprolol succinate (TOPROL-XL) 100 MG 24 hr tablet   metoprolol succinate (TOPROL-XL) 50 MG 24 hr tablet   Semaglutide, 2 MG/DOSE, (OZEMPIC, 2 MG/DOSE,) 8 MG/3ML SOPN   SKYRIZI PEN 150 MG/ML SOAJ   No current facility-administered medications for this encounter.    Myra Gianotti, PA-C Surgical Short Stay/Anesthesiology [facility] Phone [phone] [facility] Phone [phone] 10/20/2020 5:28 PM

## 2020-11-06 DIAGNOSIS — R195 Other fecal abnormalities: Secondary | ICD-10-CM | POA: Diagnosis not present

## 2020-11-06 DIAGNOSIS — R159 Full incontinence of feces: Secondary | ICD-10-CM | POA: Diagnosis not present

## 2020-11-16 ENCOUNTER — Ambulatory Visit: Payer: 59 | Admitting: Adult Health

## 2020-11-23 ENCOUNTER — Ambulatory Visit: Payer: Self-pay | Admitting: Adult Health

## 2020-11-27 ENCOUNTER — Ambulatory Visit: Payer: 59 | Admitting: Podiatry

## 2020-11-28 ENCOUNTER — Encounter (HOSPITAL_BASED_OUTPATIENT_CLINIC_OR_DEPARTMENT_OTHER): Payer: Self-pay | Admitting: *Deleted

## 2020-11-28 ENCOUNTER — Other Ambulatory Visit: Payer: Self-pay

## 2020-11-28 ENCOUNTER — Emergency Department (HOSPITAL_BASED_OUTPATIENT_CLINIC_OR_DEPARTMENT_OTHER)
Admission: EM | Admit: 2020-11-28 | Discharge: 2020-11-28 | Disposition: A | Discharge status: Home / Self Care | Payer: BC Managed Care – PPO | Attending: Emergency Medicine | Admitting: Emergency Medicine

## 2020-11-28 ENCOUNTER — Emergency Department (HOSPITAL_BASED_OUTPATIENT_CLINIC_OR_DEPARTMENT_OTHER): Payer: BC Managed Care – PPO

## 2020-11-28 DIAGNOSIS — Z7984 Long term (current) use of oral hypoglycemic drugs: Secondary | ICD-10-CM | POA: Diagnosis not present

## 2020-11-28 DIAGNOSIS — S0990XA Unspecified injury of head, initial encounter: Secondary | ICD-10-CM | POA: Diagnosis not present

## 2020-11-28 DIAGNOSIS — E119 Type 2 diabetes mellitus without complications: Secondary | ICD-10-CM | POA: Insufficient documentation

## 2020-11-28 DIAGNOSIS — Z79899 Other long term (current) drug therapy: Secondary | ICD-10-CM | POA: Insufficient documentation

## 2020-11-28 DIAGNOSIS — F17290 Nicotine dependence, other tobacco product, uncomplicated: Secondary | ICD-10-CM | POA: Insufficient documentation

## 2020-11-28 DIAGNOSIS — Z955 Presence of coronary angioplasty implant and graft: Secondary | ICD-10-CM | POA: Diagnosis not present

## 2020-11-28 DIAGNOSIS — M62830 Muscle spasm of back: Secondary | ICD-10-CM | POA: Diagnosis not present

## 2020-11-28 DIAGNOSIS — Y92410 Unspecified street and highway as the place of occurrence of the external cause: Secondary | ICD-10-CM | POA: Insufficient documentation

## 2020-11-28 DIAGNOSIS — M549 Dorsalgia, unspecified: Secondary | ICD-10-CM | POA: Diagnosis not present

## 2020-11-28 DIAGNOSIS — M542 Cervicalgia: Secondary | ICD-10-CM | POA: Insufficient documentation

## 2020-11-28 DIAGNOSIS — Z7901 Long term (current) use of anticoagulants: Secondary | ICD-10-CM | POA: Diagnosis not present

## 2020-11-28 DIAGNOSIS — I4891 Unspecified atrial fibrillation: Secondary | ICD-10-CM | POA: Insufficient documentation

## 2020-11-28 DIAGNOSIS — M62838 Other muscle spasm: Secondary | ICD-10-CM | POA: Diagnosis not present

## 2020-11-28 DIAGNOSIS — R519 Headache, unspecified: Secondary | ICD-10-CM | POA: Diagnosis not present

## 2020-11-28 DIAGNOSIS — I10 Essential (primary) hypertension: Secondary | ICD-10-CM | POA: Insufficient documentation

## 2020-11-28 DIAGNOSIS — Z794 Long term (current) use of insulin: Secondary | ICD-10-CM | POA: Diagnosis not present

## 2020-11-28 MED ORDER — CYCLOBENZAPRINE HCL 10 MG PO TABS
10.0000 mg | ORAL_TABLET | Freq: Two times a day (BID) | ORAL | 0 refills | Status: DC | PRN
Start: 2020-11-28 — End: 2022-05-07

## 2020-11-28 MED ORDER — ACETAMINOPHEN 500 MG PO TABS
1000.0000 mg | ORAL_TABLET | Freq: Once | ORAL | Status: AC
  Administered 2020-11-28: 1000 mg via ORAL
  Filled 2020-11-28: qty 2
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
## 2020-11-28 NOTE — Discharge Instructions (Addendum)
Do not mix Flexeril with other sedating medications or with dangerous activities including driving.  This medicine is slightly sedating.  Recommend taking 1000 mg of Tylenol every 6 hours as needed for pain as well.  Follow-up with your primary care doctor if you are having any persistent pain or discomfort.

## 2020-12-01 NOTE — Addendum Note (Signed)
Addendum  created 12/01/20 1739 by Jerold Coombe, PA-C   Clinical Note Signed

## 2020-12-04 ENCOUNTER — Ambulatory Visit: Payer: 59 | Admitting: Podiatry

## 2020-12-08 NOTE — Pre-Procedure Instructions (Signed)
Surgical Instructions    Your procedure is scheduled on Wednesday, November 2nd, 2022.  Report to [facility] Main Entrance "A" at 08:00 A.M., then check in with the Admitting office.  Call this number if you have problems the morning of surgery:  [phone]   If you have any questions prior to your surgery date call [phone]: Open Monday-Friday 8am-4pm    Remember:  Do not eat or drink after midnight the night before your surgery    Take these medicines the morning of surgery with A SIP OF WATER: amiodarone (PACERONE)  metoprolol succinate (TOPROL-XL) 100 MG 24 hr tablet  Take these medicines the morning of surgery with A SIP OF WATER AS NEEDED: cyclobenzaprine (FLEXERIL)     STOP taking ELIQUIS 3 days prior to surgery.  As of today, STOP taking any Aspirin (unless otherwise instructed by your [place]) Aleve, Naproxen, Ibuprofen, Motrin, Advil, Goody's, BC's, all herbal medications, fish oil, and all vitamins.   WHAT DO I DO ABOUT MY DIABETES MEDICATION?   Do not take metFORMIN (GLUCOPHAGE) the morning of surgery.  insulin glargine Tuesday 12/12/20 AM Dose: Take 100% Dinner/bedtime dose: Take 50% of dose Wednesday 12/13/20 Take 50% of dose  The day of surgery, do not take other diabetes injectables, including Semaglutide (Ozempic), Byetta (exenatide), Bydureon (exenatide ER), Victoza (liraglutide), or Trulicity (dulaglutide).   HOW TO MANAGE YOUR DIABETES BEFORE AND AFTER SURGERY  Why is it important to control my blood sugar before and after surgery? Improving blood sugar levels before and after surgery helps healing and can limit problems. A way of improving blood sugar control is eating a healthy diet by:  Eating less sugar and carbohydrates  Increasing activity/exercise  Talking with your doctor about reaching your blood sugar goals High blood sugars (greater than 180 mg/dL) can raise your risk of infections and slow your recovery, so you will need to focus  on controlling your diabetes during the weeks before surgery. Make sure that the doctor who takes care of your diabetes knows about your planned surgery including the date and location.  How do I manage my blood sugar before surgery? Check your blood sugar at least 4 times a day, starting 2 days before surgery, to make sure that the level is not too high or low.  Check your blood sugar the morning of your surgery when you wake up and every 2 hours until you get to the Short Stay unit.  If your blood sugar is less than 70 mg/dL, you will need to treat for low blood sugar: Do not take insulin. Treat a low blood sugar (less than 70 mg/dL) with  cup of clear juice (cranberry or apple), 4 glucose tablets, OR glucose gel. Recheck blood sugar in 15 minutes after treatment (to make sure it is greater than 70 mg/dL). If your blood sugar is not greater than 70 mg/dL on recheck, call [phone] for further instructions. Report your blood sugar to the short stay nurse when you get to Short Stay.  If you are admitted to the hospital after surgery: Your blood sugar will be checked by the staff and you will probably be given insulin after surgery (instead of oral diabetes medicines) to make sure you have good blood sugar levels. The goal for blood sugar control after surgery is 80-180 mg/dL.   After your COVID test   You are not required to quarantine however you are required to wear a well-fitting mask when you are out and around people not in  your household.  If your mask becomes wet or soiled, replace with a new one.  Wash your hands often with soap and water for 20 seconds or clean your hands with an alcohol-based hand sanitizer that contains at least 60% alcohol.  Do not share personal items.  Notify your provider: if you are in close contact with someone who has COVID  or if you develop a fever of 100.4 or greater, sneezing, cough, sore throat, shortness of breath or body aches.            Do not wear jewelry  Do not wear lotions, powders, colognes, or deodorant. Men may shave face and neck. Do not bring valuables to the hospital.              [facility] is not responsible for any belongings or valuables.  Do NOT Smoke (Tobacco/Vaping)  24 hours prior to your procedure  If you use a CPAP at night, you may bring your mask for your overnight stay.   Contacts, glasses, hearing aids, dentures or partials may not be worn into surgery, please bring cases for these belongings   For patients admitted to the hospital, discharge time will be determined by your treatment team.   Patients discharged the day of surgery will not be allowed to drive home, and someone needs to stay with them for 24 hours.  NO VISITORS WILL BE ALLOWED IN PRE-OP WHERE PATIENTS ARE PREPPED FOR SURGERY.  ONLY 1 SUPPORT PERSON MAY BE PRESENT IN THE WAITING ROOM WHILE YOU ARE IN SURGERY.  IF YOU ARE TO BE ADMITTED, ONCE YOU ARE IN YOUR ROOM YOU WILL BE ALLOWED TWO (2) VISITORS. 1 (ONE) VISITOR MAY STAY OVERNIGHT BUT MUST ARRIVE TO THE ROOM BY 8pm.  Minor children may have two parents present. Special consideration for safety and communication needs will be reviewed on a case by case basis.  Special instructions:    Oral Hygiene is also important to reduce your risk of infection.  Remember - BRUSH YOUR TEETH THE MORNING OF SURGERY WITH YOUR REGULAR TOOTHPASTE   [facility]- Preparing For Surgery  Before surgery, you can play an important role. Because skin is not sterile, your skin needs to be as free of germs as possible. You can reduce the number of germs on your skin by washing with CHG (chlorahexidine gluconate) Soap before surgery.  CHG is an antiseptic cleaner which kills germs and bonds with the skin to continue killing germs even after washing.     Please do not use if you have an allergy to CHG or antibacterial soaps. If your skin becomes reddened/irritated stop using the CHG.  Do not shave  (including legs and underarms) for at least 48 hours prior to first CHG shower. It is OK to shave your face.  Please follow these instructions carefully.     Shower the NIGHT BEFORE SURGERY and the MORNING OF SURGERY with CHG Soap.   If you chose to wash your hair, wash your hair first as usual with your normal shampoo. After you shampoo, rinse your hair and body thoroughly to remove the shampoo.  Then [employer] and genitals (private parts) with your normal soap and rinse thoroughly to remove soap.  After that Use CHG Soap as you would any other liquid soap. You can apply CHG directly to the skin and wash gently with a scrungie or a clean washcloth.   Apply the CHG Soap to your body ONLY FROM THE NECK DOWN.  Do not use on open wounds or open sores. Avoid contact with your eyes, ears, mouth and genitals (private parts). Wash Face and genitals (private parts)  with your normal soap.   Wash thoroughly, paying special attention to the area where your surgery will be performed.  Thoroughly rinse your body with warm water from the neck down.  DO NOT shower/wash with your normal soap after using and rinsing off the CHG Soap.  Pat yourself dry with a CLEAN TOWEL.  Wear CLEAN PAJAMAS to bed the night before surgery  Place CLEAN SHEETS on your bed the night before your surgery  DO NOT SLEEP WITH PETS.   Day of Surgery:  Take a shower with CHG soap. Wear Clean/Comfortable clothing the morning of surgery Do not apply any deodorants/lotions.   Remember to brush your teeth WITH YOUR REGULAR TOOTHPASTE.   Please read over the following fact sheets that you were given.

## 2020-12-11 ENCOUNTER — Encounter (HOSPITAL_COMMUNITY)
Admission: RE | Admit: 2020-12-11 | Discharge: 2020-12-11 | Disposition: A | Discharge status: Home / Self Care | Payer: BC Managed Care – PPO | Source: Ambulatory Visit | Attending: Otolaryngology | Admitting: Otolaryngology

## 2020-12-11 ENCOUNTER — Encounter (HOSPITAL_COMMUNITY): Payer: Self-pay

## 2020-12-11 ENCOUNTER — Other Ambulatory Visit (HOSPITAL_COMMUNITY): Payer: BC Managed Care – PPO

## 2020-12-11 ENCOUNTER — Other Ambulatory Visit: Payer: Self-pay

## 2020-12-11 VITALS — BP 125/75 | HR 81 | Temp 98.2°F | Resp 18 | Ht 74.0 in | Wt 399.2 lb

## 2020-12-11 DIAGNOSIS — I4891 Unspecified atrial fibrillation: Secondary | ICD-10-CM | POA: Diagnosis not present

## 2020-12-11 DIAGNOSIS — Z01818 Encounter for other preprocedural examination: Secondary | ICD-10-CM

## 2020-12-11 DIAGNOSIS — Z86711 Personal history of pulmonary embolism: Secondary | ICD-10-CM | POA: Diagnosis not present

## 2020-12-11 DIAGNOSIS — Z6841 Body Mass Index (BMI) 40.0 and over, adult: Secondary | ICD-10-CM | POA: Diagnosis not present

## 2020-12-11 DIAGNOSIS — I517 Cardiomegaly: Secondary | ICD-10-CM | POA: Diagnosis not present

## 2020-12-11 DIAGNOSIS — K219 Gastro-esophageal reflux disease without esophagitis: Secondary | ICD-10-CM | POA: Diagnosis not present

## 2020-12-11 DIAGNOSIS — Z79899 Other long term (current) drug therapy: Secondary | ICD-10-CM | POA: Insufficient documentation

## 2020-12-11 DIAGNOSIS — Z01812 Encounter for preprocedural laboratory examination: Secondary | ICD-10-CM | POA: Diagnosis not present

## 2020-12-11 DIAGNOSIS — Z794 Long term (current) use of insulin: Secondary | ICD-10-CM | POA: Diagnosis not present

## 2020-12-11 DIAGNOSIS — Z7984 Long term (current) use of oral hypoglycemic drugs: Secondary | ICD-10-CM | POA: Insufficient documentation

## 2020-12-11 DIAGNOSIS — Z91199 Patient's noncompliance with other medical treatment and regimen due to unspecified reason: Secondary | ICD-10-CM | POA: Diagnosis not present

## 2020-12-11 DIAGNOSIS — Z20822 Contact with and (suspected) exposure to covid-19: Secondary | ICD-10-CM | POA: Diagnosis not present

## 2020-12-11 DIAGNOSIS — I10 Essential (primary) hypertension: Secondary | ICD-10-CM | POA: Diagnosis not present

## 2020-12-11 DIAGNOSIS — J353 Hypertrophy of tonsils with hypertrophy of adenoids: Secondary | ICD-10-CM | POA: Insufficient documentation

## 2020-12-11 DIAGNOSIS — G4733 Obstructive sleep apnea (adult) (pediatric): Secondary | ICD-10-CM | POA: Diagnosis not present

## 2020-12-11 DIAGNOSIS — E119 Type 2 diabetes mellitus without complications: Secondary | ICD-10-CM | POA: Insufficient documentation

## 2020-12-11 DIAGNOSIS — Z7985 Long-term (current) use of injectable non-insulin antidiabetic drugs: Secondary | ICD-10-CM | POA: Diagnosis not present

## 2020-12-11 LAB — BASIC METABOLIC PANEL
Anion gap: 9 (ref 5–15)
BUN: 9 mg/dL (ref 6–20)
CO2: 28 mmol/L (ref 22–32)
Calcium: 9.4 mg/dL (ref 8.9–10.3)
Chloride: 100 mmol/L (ref 98–111)
Creatinine, Ser: 1.23 mg/dL (ref 0.61–1.24)
GFR, Estimated: >60 mL/min (ref >60)
Glucose, Bld: 113 mg/dL — ABNORMAL HIGH (ref 70–99)
Potassium: 3.9 mmol/L (ref 3.5–5.1)
Sodium: 137 mmol/L (ref 135–145)

## 2020-12-11 LAB — CBC
HCT: 43.1 % (ref 39.0–52.0)
Hemoglobin: 13.7 g/dL (ref 13.0–17.0)
MCH: 30.2 pg (ref 26.0–34.0)
MCHC: 31.8 g/dL (ref 30.0–36.0)
MCV: 95.1 fL (ref 80.0–100.0)
Platelets: 262 10*3/uL (ref 150–400)
RBC: 4.53 MIL/uL (ref 4.22–5.81)
RDW: 12 % (ref 11.5–15.5)
WBC: 6.5 10*3/uL (ref 4.0–10.5)
nRBC: 0 % (ref 0.0–0.2)

## 2020-12-11 LAB — SARS CORONAVIRUS 2 (TAT 6-24 HRS): SARS Coronavirus 2: NEGATIVE

## 2020-12-11 LAB — GLUCOSE, CAPILLARY: Glucose-Capillary: 118 mg/dL — ABNORMAL HIGH (ref 70–99)

## 2020-12-11 NOTE — Progress Notes (Signed)
PCP: Irena Reichmann, MD Cardiologist: Rinaldo Cloud, MD   EKG: 10/18/20 CXR: 07/05/20 ECHO: 01/25/13 Stress Test: 09/09/19 Cardiac Cath: 09/16/19   DM - Type 2 Fasting Blood Sugar- 100-135  Blood Thinner: Eliquis.  Patient to call office for instructions.    SA - yes, does not wear CPAP   Covid test - 12/11/20   Anesthesia Review: yes, cardiac history    Patient denies shortness of breath, fever, cough, and chest pain at PAT appointment.   Patient verbalized understanding of instructions provided today at the PAT appointment.  Patient asked to review instructions at home and day of surgery.

## 2020-12-11 NOTE — Pre-Procedure Instructions (Signed)
Surgical Instructions    Your procedure is scheduled on Wednesday, November 2nd, 2022.  Report to [facility] Main Entrance "A" at 08:00 A.M., then check in with the Admitting office.  Call this number if you have problems the morning of surgery:  [phone]   If you have any questions prior to your surgery date call [phone]: Open Monday-Friday 8am-4pm    Remember:  Do not eat or drink after midnight the night before your surgery    Take these medicines the morning of surgery with A SIP OF WATER: amiodarone (PACERONE)  metoprolol succinate (TOPROL-XL) 100 MG 24 hr tablet  Take these medicines the morning of surgery with A SIP OF WATER AS NEEDED: cyclobenzaprine (FLEXERIL)     Follow your Adeyemi's instructions on when to stop Eliquis.  If no instructions were given by your Sanabia then you will need to call the office to get those instructions.     As of today, STOP taking any Aspirin (unless otherwise instructed by your Cabezas) Aleve, Naproxen, Ibuprofen, Motrin, Advil, Goody's, BC's, all herbal medications, fish oil, and all vitamins.   WHAT DO I DO ABOUT MY DIABETES MEDICATION?   Do not take metFORMIN (GLUCOPHAGE) the morning of surgery.  insulin glargine Tuesday 12/12/20 AM Dose: Take 100% Dinner/bedtime dose: Take 50% of dose Wednesday 12/13/20 Take 50% of dose  The day of surgery, do not take other diabetes injectables, including Semaglutide (Ozempic), Byetta (exenatide), Bydureon (exenatide ER), Victoza (liraglutide), or Trulicity (dulaglutide).   HOW TO MANAGE YOUR DIABETES BEFORE AND AFTER SURGERY  Why is it important to control my blood sugar before and after surgery? Improving blood sugar levels before and after surgery helps healing and can limit problems. A way of improving blood sugar control is eating a healthy diet by:  Eating less sugar and carbohydrates  Increasing activity/exercise  Talking with your doctor about reaching your blood sugar  goals High blood sugars (greater than 180 mg/dL) can raise your risk of infections and slow your recovery, so you will need to focus on controlling your diabetes during the weeks before surgery. Make sure that the doctor who takes care of your diabetes knows about your planned surgery including the date and location.  How do I manage my blood sugar before surgery? Check your blood sugar at least 4 times a day, starting 2 days before surgery, to make sure that the level is not too high or low.  Check your blood sugar the morning of your surgery when you wake up and every 2 hours until you get to the Short Stay unit.  If your blood sugar is less than 70 mg/dL, you will need to treat for low blood sugar: Do not take insulin. Treat a low blood sugar (less than 70 mg/dL) with  cup of clear juice (cranberry or apple), 4 glucose tablets, OR glucose gel. Recheck blood sugar in 15 minutes after treatment (to make sure it is greater than 70 mg/dL). If your blood sugar is not greater than 70 mg/dL on recheck, call [phone] for further instructions. Report your blood sugar to the short stay nurse when you get to Short Stay.  If you are admitted to the hospital after surgery: Your blood sugar will be checked by the staff and you will probably be given insulin after surgery (instead of oral diabetes medicines) to make sure you have good blood sugar levels. The goal for blood sugar control after surgery is 80-180 mg/dL.   After your COVID test  You are not required to quarantine however you are required to wear a well-fitting mask when you are out and around people not in your household.  If your mask becomes wet or soiled, replace with a new one.  Wash your hands often with soap and water for 20 seconds or clean your hands with an alcohol-based hand sanitizer that contains at least 60% alcohol.  Do not share personal items.  Notify your provider: if you are in close contact with someone who has  COVID  or if you develop a fever of 100.4 or greater, sneezing, cough, sore throat, shortness of breath or body aches.           Do not wear jewelry  Do not wear lotions, powders, colognes, or deodorant. Men may shave face and neck. Do not bring valuables to the hospital.              [facility] is not responsible for any belongings or valuables.  Do NOT Smoke (Tobacco/Vaping)  24 hours prior to your procedure  If you use a CPAP at night, you may bring your mask for your overnight stay.   Contacts, glasses, hearing aids, dentures or partials may not be worn into surgery, please bring cases for these belongings   For patients admitted to the hospital, discharge time will be determined by your treatment team.   Patients discharged the day of surgery will not be allowed to drive home, and someone needs to stay with them for 24 hours.  NO VISITORS WILL BE ALLOWED IN PRE-OP WHERE PATIENTS ARE PREPPED FOR SURGERY.  ONLY 1 SUPPORT PERSON MAY BE PRESENT IN THE WAITING ROOM WHILE YOU ARE IN SURGERY.  IF YOU ARE TO BE ADMITTED, ONCE YOU ARE IN YOUR ROOM YOU WILL BE ALLOWED TWO (2) VISITORS. 1 (ONE) VISITOR MAY STAY OVERNIGHT BUT MUST ARRIVE TO THE ROOM BY 8pm.  Minor children may have two parents present. Special consideration for safety and communication needs will be reviewed on a case by case basis.  Special instructions:    Oral Hygiene is also important to reduce your risk of infection.  Remember - BRUSH YOUR TEETH THE MORNING OF SURGERY WITH YOUR REGULAR TOOTHPASTE   [place]- Preparing For Surgery  Before surgery, you can play an important role. Because skin is not sterile, your skin needs to be as free of germs as possible. You can reduce the number of germs on your skin by washing with CHG (chlorahexidine gluconate) Soap before surgery.  CHG is an antiseptic cleaner which kills germs and bonds with the skin to continue killing germs even after washing.     Please do not use if  you have an allergy to CHG or antibacterial soaps. If your skin becomes reddened/irritated stop using the CHG.  Do not shave (including legs and underarms) for at least 48 hours prior to first CHG shower. It is OK to shave your face.  Please follow these instructions carefully.     Shower the NIGHT BEFORE SURGERY and the MORNING OF SURGERY with CHG Soap.   If you chose to wash your hair, wash your hair first as usual with your normal shampoo. After you shampoo, rinse your hair and body thoroughly to remove the shampoo.  Then [employer] and genitals (private parts) with your normal soap and rinse thoroughly to remove soap.  After that Use CHG Soap as you would any other liquid soap. You can apply CHG directly to the skin and  wash gently with a scrungie or a clean washcloth.   Apply the CHG Soap to your body ONLY FROM THE NECK DOWN.  Do not use on open wounds or open sores. Avoid contact with your eyes, ears, mouth and genitals (private parts). Wash Face and genitals (private parts)  with your normal soap.   Wash thoroughly, paying special attention to the area where your surgery will be performed.  Thoroughly rinse your body with warm water from the neck down.  DO NOT shower/wash with your normal soap after using and rinsing off the CHG Soap.  Pat yourself dry with a CLEAN TOWEL.  Wear CLEAN PAJAMAS to bed the night before surgery  Place CLEAN SHEETS on your bed the night before your surgery  DO NOT SLEEP WITH PETS.   Day of Surgery:  Take a shower with CHG soap. Wear Clean/Comfortable clothing the morning of surgery Do not apply any deodorants/lotions.   Remember to brush your teeth WITH YOUR REGULAR TOOTHPASTE.   Please read over the following fact sheets that you were given.
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
## 2020-12-13 ENCOUNTER — Encounter (HOSPITAL_COMMUNITY)
Admission: RE | Disposition: A | Discharge status: Home / Self Care | Payer: Self-pay | Source: Home / Self Care | Attending: Otolaryngology

## 2020-12-13 ENCOUNTER — Ambulatory Visit (HOSPITAL_COMMUNITY)
Admission: RE | Admit: 2020-12-13 | Discharge: 2020-12-14 | Disposition: A | Discharge status: Home / Self Care | Payer: BC Managed Care – PPO | Attending: Otolaryngology | Admitting: Otolaryngology

## 2020-12-13 ENCOUNTER — Ambulatory Visit (HOSPITAL_COMMUNITY): Payer: BC Managed Care – PPO | Admitting: Physician Assistant

## 2020-12-13 ENCOUNTER — Ambulatory Visit (HOSPITAL_COMMUNITY): Payer: BC Managed Care – PPO | Admitting: Anesthesiology

## 2020-12-13 ENCOUNTER — Encounter (HOSPITAL_COMMUNITY): Payer: Self-pay | Admitting: Otolaryngology

## 2020-12-13 ENCOUNTER — Other Ambulatory Visit: Payer: Self-pay

## 2020-12-13 DIAGNOSIS — J3501 Chronic tonsillitis: Secondary | ICD-10-CM | POA: Insufficient documentation

## 2020-12-13 DIAGNOSIS — G4733 Obstructive sleep apnea (adult) (pediatric): Secondary | ICD-10-CM | POA: Insufficient documentation

## 2020-12-13 DIAGNOSIS — J353 Hypertrophy of tonsils with hypertrophy of adenoids: Secondary | ICD-10-CM | POA: Insufficient documentation

## 2020-12-13 DIAGNOSIS — Z9089 Acquired absence of other organs: Secondary | ICD-10-CM

## 2020-12-13 DIAGNOSIS — R599 Enlarged lymph nodes, unspecified: Secondary | ICD-10-CM | POA: Diagnosis not present

## 2020-12-13 DIAGNOSIS — K219 Gastro-esophageal reflux disease without esophagitis: Secondary | ICD-10-CM | POA: Diagnosis not present

## 2020-12-13 DIAGNOSIS — E119 Type 2 diabetes mellitus without complications: Secondary | ICD-10-CM | POA: Diagnosis not present

## 2020-12-13 DIAGNOSIS — Z7901 Long term (current) use of anticoagulants: Secondary | ICD-10-CM | POA: Diagnosis not present

## 2020-12-13 DIAGNOSIS — J312 Chronic pharyngitis: Secondary | ICD-10-CM | POA: Diagnosis not present

## 2020-12-13 DIAGNOSIS — J029 Acute pharyngitis, unspecified: Secondary | ICD-10-CM | POA: Diagnosis not present

## 2020-12-13 DIAGNOSIS — J3503 Chronic tonsillitis and adenoiditis: Secondary | ICD-10-CM | POA: Diagnosis not present

## 2020-12-13 DIAGNOSIS — F17290 Nicotine dependence, other tobacco product, uncomplicated: Secondary | ICD-10-CM | POA: Diagnosis not present

## 2020-12-13 DIAGNOSIS — I4891 Unspecified atrial fibrillation: Secondary | ICD-10-CM | POA: Diagnosis not present

## 2020-12-13 DIAGNOSIS — I10 Essential (primary) hypertension: Secondary | ICD-10-CM | POA: Diagnosis not present

## 2020-12-13 HISTORY — PX: TONSILLECTOMY AND ADENOIDECTOMY: SHX28

## 2020-12-13 LAB — GLUCOSE, CAPILLARY
Glucose-Capillary: 156 mg/dL — ABNORMAL HIGH (ref 70–99)
Glucose-Capillary: 139 mg/dL — ABNORMAL HIGH (ref 70–99)

## 2020-12-13 SURGERY — TONSILLECTOMY AND ADENOIDECTOMY
Anesthesia: General | Site: Mouth

## 2020-12-13 MED ORDER — OXYMETAZOLINE HCL 0.05 % NA SOLN
NASAL | Status: DC | PRN
  Administered 2020-12-13: 1 via TOPICAL

## 2020-12-13 MED ORDER — ORAL CARE MOUTH RINSE: 15.0000 mL | Freq: Once | OROMUCOSAL | Status: AC

## 2020-12-13 MED ORDER — OXYCODONE HCL 5 MG PO TABS
ORAL_TABLET | ORAL | Status: AC
  Filled 2020-12-13: qty 1

## 2020-12-13 MED ORDER — ONDANSETRON HCL 4 MG PO TABS: 4.0000 mg | ORAL_TABLET | ORAL | Status: DC | PRN

## 2020-12-13 MED ORDER — MORPHINE SULFATE (PF) 2 MG/ML IV SOLN: 2.0000 mg | INTRAVENOUS | Status: DC | PRN

## 2020-12-13 MED ORDER — OXYCODONE HCL 5 MG/5ML PO SOLN: 5.0000 mg | Freq: Once | ORAL | Status: AC | PRN

## 2020-12-13 MED ORDER — ROCURONIUM BROMIDE 10 MG/ML (PF) SYRINGE
PREFILLED_SYRINGE | INTRAVENOUS | Status: AC
  Filled 2020-12-13: qty 10

## 2020-12-13 MED ORDER — SODIUM CHLORIDE 0.9 % IR SOLN
Status: DC | PRN
  Administered 2020-12-13: 1000 mL

## 2020-12-13 MED ORDER — MIDAZOLAM HCL 2 MG/2ML IJ SOLN
INTRAMUSCULAR | Status: DC | PRN
  Administered 2020-12-13: 2 mg via INTRAVENOUS

## 2020-12-13 MED ORDER — ROCURONIUM BROMIDE 10 MG/ML (PF) SYRINGE
PREFILLED_SYRINGE | INTRAVENOUS | Status: DC | PRN
  Administered 2020-12-13: 50 mg via INTRAVENOUS

## 2020-12-13 MED ORDER — SUGAMMADEX SODIUM 200 MG/2ML IV SOLN
INTRAVENOUS | Status: DC | PRN
  Administered 2020-12-13: 100 mg via INTRAVENOUS
  Administered 2020-12-13: 400 mg via INTRAVENOUS

## 2020-12-13 MED ORDER — PROPOFOL 10 MG/ML IV BOLUS
INTRAVENOUS | Status: DC | PRN
  Administered 2020-12-13: 300 mg via INTRAVENOUS

## 2020-12-13 MED ORDER — ONDANSETRON HCL 4 MG/2ML IJ SOLN
INTRAMUSCULAR | Status: AC
  Filled 2020-12-13: qty 2

## 2020-12-13 MED ORDER — CHLORHEXIDINE GLUCONATE 0.12 % MT SOLN
15.0000 mL | Freq: Once | OROMUCOSAL | Status: AC
  Administered 2020-12-13: 15 mL via OROMUCOSAL
  Filled 2020-12-13: qty 15

## 2020-12-13 MED ORDER — POTASSIUM CHLORIDE IN NACL 20-0.45 MEQ/L-% IV SOLN
INTRAVENOUS | Status: DC
  Filled 2020-12-13 (×4): qty 1000

## 2020-12-13 MED ORDER — HYDROCHLOROTHIAZIDE 25 MG PO TABS
25.0000 mg | ORAL_TABLET | Freq: Every day | ORAL | Status: DC
  Administered 2020-12-13 – 2020-12-14 (×2): 25 mg via ORAL
  Filled 2020-12-13 (×2): qty 1

## 2020-12-13 MED ORDER — LOSARTAN POTASSIUM-HCTZ 100-25 MG PO TABS: 1.0000 | ORAL_TABLET | Freq: Every day | ORAL | Status: DC

## 2020-12-13 MED ORDER — FENTANYL CITRATE (PF) 250 MCG/5ML IJ SOLN
INTRAMUSCULAR | Status: DC | PRN
  Administered 2020-12-13: 100 ug via INTRAVENOUS
  Administered 2020-12-13: 25 ug via INTRAVENOUS

## 2020-12-13 MED ORDER — PHENYLEPHRINE 40 MCG/ML (10ML) SYRINGE FOR IV PUSH (FOR BLOOD PRESSURE SUPPORT)
PREFILLED_SYRINGE | INTRAVENOUS | Status: AC
  Filled 2020-12-13: qty 10

## 2020-12-13 MED ORDER — 0.9 % SODIUM CHLORIDE (POUR BTL) OPTIME
TOPICAL | Status: DC | PRN
  Administered 2020-12-13: 1000 mL

## 2020-12-13 MED ORDER — MIDAZOLAM HCL 2 MG/2ML IJ SOLN
INTRAMUSCULAR | Status: AC
  Filled 2020-12-13: qty 2

## 2020-12-13 MED ORDER — AMIODARONE HCL 200 MG PO TABS
200.0000 mg | ORAL_TABLET | Freq: Every day | ORAL | Status: DC
  Administered 2020-12-14: 200 mg via ORAL
  Filled 2020-12-13: qty 1

## 2020-12-13 MED ORDER — LACTATED RINGERS IV SOLN: INTRAVENOUS | Status: DC

## 2020-12-13 MED ORDER — PHENYLEPHRINE 40 MCG/ML (10ML) SYRINGE FOR IV PUSH (FOR BLOOD PRESSURE SUPPORT)
PREFILLED_SYRINGE | INTRAVENOUS | Status: DC | PRN
  Administered 2020-12-13: 200 ug via INTRAVENOUS
  Administered 2020-12-13 (×2): 120 ug via INTRAVENOUS

## 2020-12-13 MED ORDER — ONDANSETRON HCL 4 MG/2ML IJ SOLN
INTRAMUSCULAR | Status: DC | PRN
  Administered 2020-12-13: 4 mg via INTRAVENOUS

## 2020-12-13 MED ORDER — SUCCINYLCHOLINE CHLORIDE 200 MG/10ML IV SOSY
PREFILLED_SYRINGE | INTRAVENOUS | Status: DC | PRN
  Administered 2020-12-13: 200 mg via INTRAVENOUS

## 2020-12-13 MED ORDER — HYDROCHLOROTHIAZIDE 25 MG PO TABS: 25.0000 mg | ORAL_TABLET | Freq: Every day | ORAL | Status: DC

## 2020-12-13 MED ORDER — LIDOCAINE 2% (20 MG/ML) 5 ML SYRINGE
INTRAMUSCULAR | Status: AC
  Filled 2020-12-13: qty 5

## 2020-12-13 MED ORDER — PROMETHAZINE HCL 25 MG/ML IJ SOLN: 6.2500 mg | INTRAMUSCULAR | Status: DC | PRN

## 2020-12-13 MED ORDER — ONDANSETRON HCL 4 MG/2ML IJ SOLN: 4.0000 mg | INTRAMUSCULAR | Status: DC | PRN

## 2020-12-13 MED ORDER — DEXAMETHASONE SODIUM PHOSPHATE 10 MG/ML IJ SOLN
INTRAMUSCULAR | Status: AC
  Filled 2020-12-13: qty 1

## 2020-12-13 MED ORDER — PHENYLEPHRINE HCL (PRESSORS) 10 MG/ML IV SOLN
INTRAVENOUS | Status: AC
  Filled 2020-12-13: qty 1

## 2020-12-13 MED ORDER — METOPROLOL SUCCINATE ER 100 MG PO TB24
100.0000 mg | ORAL_TABLET | Freq: Every day | ORAL | Status: DC
  Administered 2020-12-14: 100 mg via ORAL
  Filled 2020-12-13: qty 1

## 2020-12-13 MED ORDER — PHENYLEPHRINE HCL-NACL 20-0.9 MG/250ML-% IV SOLN
INTRAVENOUS | Status: DC | PRN
  Administered 2020-12-13: 40 ug/min via INTRAVENOUS

## 2020-12-13 MED ORDER — OXYCODONE-ACETAMINOPHEN 5-325 MG PO TABS
1.0000 | ORAL_TABLET | ORAL | Status: DC | PRN
  Administered 2020-12-13 – 2020-12-14 (×3): 2 via ORAL
  Filled 2020-12-13 (×3): qty 2

## 2020-12-13 MED ORDER — HYDROMORPHONE HCL 1 MG/ML IJ SOLN
0.2500 mg | INTRAMUSCULAR | Status: DC | PRN
Start: 2020-12-13 — End: 2020-12-13

## 2020-12-13 MED ORDER — ZOLPIDEM TARTRATE 5 MG PO TABS
5.0000 mg | ORAL_TABLET | Freq: Every evening | ORAL | Status: DC | PRN
  Administered 2020-12-13: 5 mg via ORAL
  Filled 2020-12-13: qty 1

## 2020-12-13 MED ORDER — OXYMETAZOLINE HCL 0.05 % NA SOLN
NASAL | Status: AC
  Filled 2020-12-13: qty 30

## 2020-12-13 MED ORDER — EPHEDRINE 5 MG/ML INJ
INTRAVENOUS | Status: AC
  Filled 2020-12-13: qty 5

## 2020-12-13 MED ORDER — ACETAMINOPHEN 500 MG PO TABS
1000.0000 mg | ORAL_TABLET | Freq: Once | ORAL | Status: AC
  Administered 2020-12-13: 1000 mg via ORAL
  Filled 2020-12-13: qty 2

## 2020-12-13 MED ORDER — LIDOCAINE 2% (20 MG/ML) 5 ML SYRINGE
INTRAMUSCULAR | Status: DC | PRN
  Administered 2020-12-13: 60 mg via INTRAVENOUS

## 2020-12-13 MED ORDER — OXYCODONE HCL 5 MG PO TABS
5.0000 mg | ORAL_TABLET | Freq: Once | ORAL | Status: AC | PRN
  Administered 2020-12-13: 5 mg via ORAL

## 2020-12-13 MED ORDER — EPHEDRINE SULFATE-NACL 50-0.9 MG/10ML-% IV SOSY
PREFILLED_SYRINGE | INTRAVENOUS | Status: DC | PRN
  Administered 2020-12-13: 5 mg via INTRAVENOUS

## 2020-12-13 MED ORDER — FENTANYL CITRATE (PF) 250 MCG/5ML IJ SOLN
INTRAMUSCULAR | Status: AC
  Filled 2020-12-13: qty 5

## 2020-12-13 MED ORDER — DEXAMETHASONE SODIUM PHOSPHATE 10 MG/ML IJ SOLN
INTRAMUSCULAR | Status: DC | PRN
Start: 2020-12-13 — End: 2020-12-13
  Administered 2020-12-13: 5 mg via INTRAVENOUS

## 2020-12-13 MED ORDER — LOSARTAN POTASSIUM 50 MG PO TABS
100.0000 mg | ORAL_TABLET | Freq: Every day | ORAL | Status: DC
  Administered 2020-12-13 – 2020-12-14 (×2): 100 mg via ORAL
  Filled 2020-12-13 (×2): qty 2

## 2020-12-13 MED ORDER — METFORMIN HCL 500 MG PO TABS
1000.0000 mg | ORAL_TABLET | Freq: Two times a day (BID) | ORAL | Status: DC
  Administered 2020-12-14: 1000 mg via ORAL
  Filled 2020-12-13: qty 2

## 2020-12-13 SURGICAL SUPPLY — 21 items
BAG COUNTER SPONGE SURGICOUNT (BAG) ×2 IMPLANT
BAG SPNG CNTER NS LX DISP (BAG) ×1
CANISTER SUCT 3000ML PPV (MISCELLANEOUS) ×2 IMPLANT
CATH ROBINSON RED A/P 10FR (CATHETERS) ×1 IMPLANT
COAGULATOR SUCT SWTCH 10FR 6 (ELECTROSURGICAL) ×1 IMPLANT
ELECT REM PT RETURN 9FT ADLT (ELECTROSURGICAL) ×2
ELECTRODE REM PT RTRN 9FT ADLT (ELECTROSURGICAL) IMPLANT
GAUZE 4X4 16PLY ~~LOC~~+RFID DBL (SPONGE) ×2 IMPLANT
GLOVE SURG LTX SZ7.5 (GLOVE) ×2 IMPLANT
GOWN STRL REUS W/ TWL LRG LVL3 (GOWN DISPOSABLE) ×2 IMPLANT
GOWN STRL REUS W/TWL LRG LVL3 (GOWN DISPOSABLE) ×4
KIT BASIN OR (CUSTOM PROCEDURE TRAY) ×2 IMPLANT
KIT TURNOVER KIT B (KITS) ×2 IMPLANT
NS IRRIG 1000ML POUR BTL (IV SOLUTION) ×2 IMPLANT
PACK SURGICAL SETUP 50X90 (CUSTOM PROCEDURE TRAY) ×2 IMPLANT
SPONGE TONSIL TAPE 1 RFD (DISPOSABLE) ×2 IMPLANT
SYR BULB EAR ULCER 3OZ GRN STR (SYRINGE) ×2 IMPLANT
TOWEL GREEN STERILE FF (TOWEL DISPOSABLE) ×4 IMPLANT
TUBE CONNECTING 12X1/4 (SUCTIONS) ×2 IMPLANT
TUBE SALEM SUMP 16 FR W/ARV (TUBING) ×2 IMPLANT
WAND COBLATOR 70 EVAC XTRA (SURGICAL WAND) ×2 IMPLANT

## 2020-12-13 NOTE — H&P (Signed)
Cc: Enlarged tonsils, recurrent sore throat   HPI: The patient is a 50 y/o male who presents today for evaluation of enlarged tonsils. The patient is seen in consultation requested by [facility]. The patient has noted issues with his tonsils for most of his life. He has had frequent sore throats since he was young. The patient feels his tonsils block his upper airway. He has difficulty swallowing and breathing at times. The patient has sleep apnea but has difficulty tolerating his CPAP. He just completed an antibiotic for tonsillitis. Previous ENT surgery is denied.   The patient's review of systems (constitutional, eyes, ENT, cardiovascular, respiratory, GI, musculoskeletal, skin, neurologic, psychiatric, endocrine, hematologic, allergic) is noted in the ROS questionnaire.  It is reviewed with the patient.   Family health history: Diabetes.  Major events: Left ACL repair.  Ongoing medical problems: Hypertension, diabetes, OSA.  Social history: The patient is single. He smokes cigars daily. He seldom drinks alcohol . He denies the use of illegal drugs.   Exam: General: Communicates without difficulty, obese, no acute distress. Head:  Normocephalic, no lesions or asymmetry. Eyes: PERRL, EOMI. No scleral icterus, conjunctivae clear.  Neuro: CN II exam reveals vision grossly intact.  No nystagmus at any point of gaze. Ears:  EAC normal without erythema AU.  TM intact without fluid and mobile AU. Nose: Moist, pink mucosa without lesions or mass. Mouth: Oral cavity clear and moist, no lesions, tonsils symmetric. Tonsils are 4+. Tonsils with mild erythema. Neck: Full range of motion, no lymphadenopathy or masses.   Assessment  1.  The patient's history and physical exam findings are consistent with chronic tonsillitis/pharyngitis secondary to adenotonsillar hypertrophy. 2.  Obstructive sleep apnea.  Plan  1. The treatment options include continuing conservative observation versus  adenotonsillectomy.  Based on the patient's history and physical exam findings, the patient will likely benefit from having the tonsils and adenoid removed.  The risks, benefits, alternatives, and details of the procedure are reviewed with the patient.  Questions are invited and answered.  2. The patient is interested in proceeding with the procedure.  We will schedule the procedure in accordance with the family schedule.

## 2020-12-13 NOTE — Op Note (Signed)
DATE OF PROCEDURE:  12/13/2020                              OPERATIVE REPORT  Low:  Newman Pies, MD  PREOPERATIVE DIAGNOSES: 1. Adenotonsillar hypertrophy. 2. Chronic tonsillitis and pharyngitis  POSTOPERATIVE DIAGNOSES: 1. Adenotonsillar hypertrophy. 2. Chronic tonsillitis and pharyngitis  PROCEDURE PERFORMED:  Adenotonsillectomy.  ANESTHESIA:  General endotracheal tube anesthesia.  COMPLICATIONS:  None.  ESTIMATED BLOOD LOSS:  Minimal.  INDICATION FOR PROCEDURE:  Phillip Frye is a 50 y.o. male with a history of chronic tonsillitis/pharyngitis and sleep apnea.  According to the patient, he has been experiencing chronic throat discomfort with halitosis for several years. The patient continued to be symptomatic despite medical treatments. On examination, the patient was noted to have bilateral enlarged cryptic tonsils, causing upper airway obstruction. Based on the above findings, the decision was made for the patient to undergo the adenotonsillectomy procedure. Likelihood of success in reducing symptoms was also discussed.  The risks, benefits, alternatives, and details of the procedure were discussed with the patient.  Questions were invited and answered.  Informed consent was obtained.  DESCRIPTION:  The patient was taken to the operating room and placed supine on the operating table.  General endotracheal tube anesthesia was administered by the anesthesiologist.  The patient was positioned and prepped and draped in a standard fashion for adenotonsillectomy.  A Crowe-Davis mouth gag was inserted into the oral cavity for exposure. 3+ cryptic tonsils were noted bilaterally.  No bifidity was noted.  Indirect mirror examination of the nasopharynx revealed mild adenoid hypertrophy. The adenoid was ablated with the Coblator device. Hemostasis was achieved with the Coblator device.  The right tonsil was then grasped with a straight Allis clamp and retracted medially.  It was resected free  from the underlying pharyngeal constrictor muscles with the Coblator device.  The same procedure was repeated on the left side without exception.  The surgical sites were copiously irrigated.  The mouth gag was removed.  The care of the patient was turned over to the anesthesiologist.  The patient was awakened from anesthesia without difficulty.  The patient was extubated and transferred to the recovery room in good condition.  OPERATIVE FINDINGS:  Adenotonsillar hypertrophy.  SPECIMEN:  Bilateral tonsils  FOLLOWUP CARE:  The patient will be observed overnight due to his OSA.  The patient will follow up in my office in approximately 2 weeks.  Malissia Rabbani W Bassheva Flury 12/13/2020 10:38 AM

## 2020-12-13 NOTE — Anesthesia Procedure Notes (Addendum)
Procedure Name: Intubation Date/Time: 12/13/2020 9:59 AM Performed by: Arville Lime, CRNA Pre-anesthesia Checklist: Patient identified, Emergency Drugs available, Suction available and Patient being monitored Patient Re-evaluated:Patient Re-evaluated prior to induction Oxygen Delivery Method: Circle System Utilized Preoxygenation: Pre-oxygenation with 100% oxygen Induction Type: IV induction Ventilation: Oral airway inserted - appropriate to patient size and Two handed mask ventilation required Laryngoscope Size: Glidescope and 4 Grade View: Grade I Tube type: Oral Tube size: 7.5 mm Number of attempts: 1 Airway Equipment and Method: Oral airway, Video-laryngoscopy and Stylet Placement Confirmation: ETT inserted through vocal cords under direct vision, positive ETCO2 and breath sounds checked- equal and bilateral Secured at: 23 cm Tube secured with: Tape Dental Injury: Teeth and Oropharynx as per pre-operative assessment

## 2020-12-13 NOTE — Anesthesia Postprocedure Evaluation (Signed)
Anesthesia Post Note  Patient: Phillip Frye  Procedure(s) Performed: TONSILLECTOMY AND ADENOIDECTOMY     Patient location during evaluation: PACU Anesthesia Type: General Level of consciousness: awake and alert, oriented and patient cooperative Pain management: pain level controlled Vital Signs Assessment: post-procedure vital signs reviewed and stable Respiratory status: spontaneous breathing, nonlabored ventilation and respiratory function stable Cardiovascular status: blood pressure returned to baseline and stable Postop Assessment: no apparent nausea or vomiting Anesthetic complications: no   No notable events documented.  Last Vitals:  Vitals:   12/13/20 1230 12/13/20 1300  BP: 110/65 (!) 118/54  Pulse: 65 66  Resp: 16 20  Temp: 36.7 C   SpO2: 92% 98%    Last Pain:  Vitals:   12/13/20 1230  TempSrc:   PainSc: Asleep                 Lannie Fields

## 2020-12-13 NOTE — Discharge Instructions (Signed)
Dillie Burandt WOOI Monchel Pollitt M.D., P.A. Postoperative Instructions for Tonsillectomy & Adenoidectomy (T&A) Activity Restrict activity at home for the first two days, resting as much as possible. Light indoor activity is best. You may usually return to school or work within a week but void strenuous activity and sports for two weeks. Sleep with your head elevated on 2-3 pillows for 3-4 days to help decrease swelling. Diet Due to tissue swelling and throat discomfort, you may have little desire to drink for several days. However fluids are very important to prevent dehydration. You will find that non-acidic juices, soups, popsicles, Jell-O, custard, puddings, and any soft or mashed foods taken in small quantities can be swallowed fairly easily. Try to increase your fluid and food intake as the discomfort subsides. It is recommended that a child receive 1-1/2 quarts of fluid in a 24-hour period. Adult require twice this amount.  Discomfort Your sore throat may be relieved by applying an ice collar to your neck and/or by taking Tylenol. You may experience an earache, which is due to referred pain from the throat. Referred ear pain is commonly felt at night when trying to rest.  Bleeding                        Although rare, there is risk of having some bleeding during the first 2 weeks after having a T&A. This usually happens between days 7-10 postoperatively. If you or your child should have any bleeding, try to remain calm. We recommend sitting up quietly in a chair and gently spitting out the blood into a bowl. For adults, gargling gently with ice water may help. If the bleeding does not stop after a short time (5 minutes), is more than 1 teaspoonful, or if you become worried, please call our office at [phone] or go directly to the nearest hospital emergency room. Do not eat or drink anything prior to going to the hospital as you may need to be taken to the operating room in order to control the bleeding. GENERAL  CONSIDERATIONS Brush your teeth regularly. Avoid mouthwashes and gargles for three weeks. You may gargle gently with warm salt-water as necessary or spray with Chloraseptic. You may make salt-water by placing 2 teaspoons of table salt into a quart of fresh water. Warm the salt-water in a microwave to a luke warm temperature.  Avoid exposure to colds and upper respiratory infections if possible.  If you look into a mirror or into your child's mouth, you will see white-gray patches in the back of the throat. This is normal after having a T&A and is like a scab that forms on the skin after an abrasion. It will disappear once the back of the throat heals completely. However, it may cause a noticeable odor; this too will disappear with time. Again, warm salt-water gargles may be used to help keep the throat clean and promote healing.  You may notice a temporary change in voice quality, such as a higher pitched voice or a nasal sound, until healing is complete. This may last for 1-2 weeks and should resolve.  Do not take or give you child any medications that we have not prescribed or recommended.  Snoring may occur, especially at night, for the first week after a T&A. It is due to swelling of the soft palate and will usually resolve.  Please call our office at [phone] if you have any questions.    

## 2020-12-13 NOTE — Transfer of Care (Signed)
Immediate Anesthesia Transfer of Care Note  Patient: Phillip Frye  Procedure(s) Performed: TONSILLECTOMY AND ADENOIDECTOMY  Patient Location: PACU  Anesthesia Type:General  Level of Consciousness: awake and drowsy  Airway & Oxygen Therapy: Patient Spontanous Breathing and Patient connected to face mask oxygen  Post-op Assessment: Report given to RN and Post -op Vital signs reviewed and stable  Post vital signs: Reviewed and stable  Last Vitals:  Vitals Value Taken Time  BP 130/63 12/13/20 1105  Temp 37 C 12/13/20 1100  Pulse 72 12/13/20 1106  Resp 23 12/13/20 1106  SpO2 98 % 12/13/20 1106  Vitals shown include unvalidated device data.  Last Pain:  Vitals:   12/13/20 1100  TempSrc:   PainSc: 0-No pain         Complications: No notable events documented.

## 2020-12-14 ENCOUNTER — Encounter (HOSPITAL_COMMUNITY): Payer: Self-pay | Admitting: Otolaryngology

## 2020-12-14 DIAGNOSIS — J3501 Chronic tonsillitis: Secondary | ICD-10-CM | POA: Diagnosis not present

## 2020-12-14 DIAGNOSIS — F17290 Nicotine dependence, other tobacco product, uncomplicated: Secondary | ICD-10-CM | POA: Diagnosis not present

## 2020-12-14 DIAGNOSIS — J029 Acute pharyngitis, unspecified: Secondary | ICD-10-CM | POA: Diagnosis not present

## 2020-12-14 DIAGNOSIS — E119 Type 2 diabetes mellitus without complications: Secondary | ICD-10-CM | POA: Diagnosis not present

## 2020-12-14 DIAGNOSIS — Z7901 Long term (current) use of anticoagulants: Secondary | ICD-10-CM | POA: Diagnosis not present

## 2020-12-14 DIAGNOSIS — I10 Essential (primary) hypertension: Secondary | ICD-10-CM | POA: Diagnosis not present

## 2020-12-14 DIAGNOSIS — J353 Hypertrophy of tonsils with hypertrophy of adenoids: Secondary | ICD-10-CM | POA: Diagnosis not present

## 2020-12-14 DIAGNOSIS — G4733 Obstructive sleep apnea (adult) (pediatric): Secondary | ICD-10-CM | POA: Diagnosis not present

## 2020-12-14 LAB — SURGICAL PATHOLOGY

## 2020-12-14 MED ORDER — OXYCODONE-ACETAMINOPHEN 5-325 MG PO TABS: 2.0000 | ORAL_TABLET | ORAL | 0 refills | Status: AC | PRN

## 2020-12-14 MED ORDER — AMOXICILLIN 400 MG/5ML PO SUSR: 800.0000 mg | Freq: Two times a day (BID) | ORAL | 0 refills | Status: AC

## 2020-12-14 NOTE — Progress Notes (Signed)
Pt discharged to home via wheelchair with all belongings.

## 2020-12-14 NOTE — Progress Notes (Signed)
Mobility Specialist Progress Note   12/14/20 1037  Mobility  Activity Ambulated in hall  Level of Assistance Independent  Assistive Device None  Distance Ambulated (ft) 550 ft  Mobility Ambulated independently in hallway  Mobility Response Tolerated well  Mobility performed by Mobility specialist  $Mobility charge 1 Mobility   Received pt sitting EOB bed having no complaints and agreeable to mobility. Asymptomatic throughout ambulation, returned back to bed w/ call bell by side and all needs met.  Pre Mobility: 101 HR, 98% SpO2  Holland Falling Mobility Specialist Phone Number [phone]

## 2020-12-14 NOTE — Discharge Summary (Signed)
Physician Discharge Summary  Patient ID: Phillip Frye MRN: [id number] DOB/AGE: 52-18-1972 52 y.o.  Admit date: 12/13/2020 Discharge date: 12/14/2020  Admission Diagnoses: Adenotonsillar hypertrophy, chronic tonsillitis  Discharge Diagnoses: Adenotonsillar hypertrophy, chronic tonsillitis Active Problems:   S/P T&A (status post tonsillectomy and adenoidectomy)   Discharged Condition: good  Hospital Course: Pt had an uneventful overnight stay. Pt tolerated po well. No bleeding. No stridor.   Consults: None  Significant Diagnostic Studies: None  Treatments: surgery: T&A  Discharge Exam: Blood pressure (!) 148/97, pulse 81, temperature 97.8 F (36.6 C), temperature source Oral, resp. rate 18, height $RemoveBe'6\' 2"'pzNImoNOY$  (1.88 m), weight (!) 181 kg, SpO2 (!) 88 %. No issues overnight. No bleeding.  Disposition: Discharge disposition: 01-Home or Self Care       Discharge Instructions     Activity as tolerated - No restrictions   Complete by: As directed    Diet general   Complete by: As directed       Allergies as of 12/14/2020   No Known Allergies      Medication List     STOP taking these medications    Eliquis 5 MG Tabs tablet Generic drug: apixaban       TAKE these medications    amiodarone 200 MG tablet Commonly known as: PACERONE Take 200 mg by mouth daily.   amoxicillin 400 MG/5ML suspension Commonly known as: AMOXIL Take 10 mLs (800 mg total) by mouth 2 (two) times daily for 5 days.   atorvastatin 10 MG tablet Commonly known as: LIPITOR TAKE 1 TABLET (10 MG TOTAL) BY MOUTH DAILY AT 6 PM.   blood glucose meter kit and supplies Dispense based on patient and insurance preference. Use up to four times daily as directed. (FOR ICD-10 E10.9, E11.9).   Cinnamon 500 MG Tabs Take 1,000 mg by mouth daily.   cyclobenzaprine 10 MG tablet Commonly known as: FLEXERIL Take 1 tablet (10 mg total) by mouth 2 (two) times daily as needed for muscle spasms.    freestyle lancets   FreeStyle Libre 2 Reader Devi 1 Device by Does not apply route as directed.   FreeStyle Libre 2 Sensor Misc 1 Device by Does not apply route every 14 (fourteen) days.   furosemide 40 MG tablet Commonly known as: LASIX Take 40 mg by mouth daily.   glucose blood test strip Up to 3 times per day - DM2 with hyperglycemia. Uncontrolled.   Insulin Pen Needle 32G X 4 MM Misc 1 Device by Does not apply route daily.   Klor-Con 10 10 MEQ tablet Generic drug: potassium chloride Take 10 mEq by mouth daily.   losartan-hydrochlorothiazide 100-25 MG tablet Commonly known as: HYZAAR Take 1 tablet by mouth daily.   metFORMIN 1000 MG tablet Commonly known as: GLUCOPHAGE TAKE 1 TABLET (1,000 MG TOTAL) BY MOUTH 2 (TWO) TIMES DAILY WITH A MEAL.   metoprolol succinate 100 MG 24 hr tablet Commonly known as: TOPROL-XL Take 100 mg by mouth daily. Morning   metoprolol succinate 50 MG 24 hr tablet Commonly known as: TOPROL-XL Take 50 mg by mouth at bedtime.   oxyCODONE-acetaminophen 5-325 MG tablet Commonly known as: Percocet Take 2 tablets by mouth every 4 (four) hours as needed for up to 5 days for severe pain.   Ozempic (2 MG/DOSE) 8 MG/3ML Sopn Generic drug: Semaglutide (2 MG/DOSE) Inject 2 mg into the skin once a week.   Skyrizi Pen 150 MG/ML Soaj Generic drug: Risankizumab-rzaa Inject 150 mg into the skin every 3 (  three) months.   Toujeo Max SoloStar 300 UNIT/ML Solostar Pen Generic drug: insulin glargine (2 Unit Dial) Inject 40 Units into the skin daily. What changed: how much to take        Follow-up Information     Leta Baptist, MD Follow up in 2 week(s).   Specialty: Otolaryngology Why: As scheduled Contact information: [address]  [place] [address] [phone]                 Signed: Burley Saver 12/14/2020, 9:29 AM

## 2020-12-14 NOTE — Progress Notes (Signed)
Discharge instructions given to pt. Pt verbalized understanding of all teaching. Patient currently in room waiting on ride to pick him up for discharge.

## 2020-12-27 DIAGNOSIS — L810 Postinflammatory hyperpigmentation: Secondary | ICD-10-CM | POA: Diagnosis not present

## 2020-12-27 DIAGNOSIS — L400 Psoriasis vulgaris: Secondary | ICD-10-CM | POA: Diagnosis not present

## 2021-01-18 DIAGNOSIS — Z23 Encounter for immunization: Secondary | ICD-10-CM | POA: Diagnosis not present

## 2021-01-18 DIAGNOSIS — R519 Headache, unspecified: Secondary | ICD-10-CM | POA: Diagnosis not present

## 2021-01-25 ENCOUNTER — Ambulatory Visit: Payer: BC Managed Care – PPO | Admitting: Internal Medicine

## 2021-01-25 NOTE — Progress Notes (Deleted)
Name: Phillip Frye  Age/ Sex: 50 y.o., male   MRN/ DOB: [id number], Nov 17, 1970     PCP: Janie Morning, DO   Reason for Endocrinology Evaluation: Type 2 Diabetes Mellitus  Initial Endocrine Consultative Visit: 10/28/2018    PATIENT IDENTIFIER: Phillip Frye is a 50 y.o. male with a past medical history of HTN, T2DM,A. Fib , OSA and PE. The patient has followed with Endocrinology clinic since 10/28/2018 for consultative assistance with management of his diabetes.     DIABETIC HISTORY:  Phillip Frye was diagnosed with DM in 2019. Has been on victoza and Januvia .Hemoglobin A1c has ranged from  10.5% in 2020, peaking at 12.8% in 2019.   ON his initial visit to our clinic his A1c was 10.5%. He was on Basaglar and Metformin. Ozempic was added. Basaglar was discontinued 11/2018 due to hypoglycemia.   Insulin restarted 06/2020 due to hyperglycemia and an A1c 10.8%      Pt had normal cardiac cath 09/16/2019   HTN History :  Renin normal at 1.535 and Aldo at 3.3  SUBJECTIVE:   During the last visit (10/02/2020):  A1c was 7.8% . We increased  Ozempic and continued metformin and basal insulin      Today (01/25/2021): [name] is here for a follow up on diabetes management   S/P  tonsillectomy ,  12/13/2020   Saw cardiology - changed BP meds due to hypertension - does not recall changes at this time   Denies nausea or vomiting Had one episode off diarrhea     HOME DIABETES REGIMEN:  Ozempic 2 mg weekly  Metformin 1000 mg BID  Toujeo 40 units daily       METER DOWNLOAD SUMMARY:Did not bring      DIABETIC COMPLICATIONS: Microvascular complications:    Denies: CKD, neuropathy , retinopathy  Last eye exam: Completed 2020   Macrovascular complications:    Denies: CAD, PVD, CVA      HISTORY:  Past Medical History:  Past Medical History:  Diagnosis Date   Arthritis    Atrial fibrillation ([place])    Diabetes mellitus without complication  ([place])    Dysrhythmia    unknown type   GERD (gastroesophageal reflux disease)    Hypertension    Obesity    Pulmonary embolism, bilateral ([place]) 09/2012   on xarelto   Shortness of breath 05/21/2013   with exertion   Sleep apnea    uses c pap    Past Surgical History:  Past Surgical History:  Procedure Laterality Date   BREATH TEK H PYLORI N/A 12/28/2012   Procedure: BREATH TEK H PYLORI;  [place]ault: Phillip Curry, MD;  Location: [facility];  Service: General;  Laterality: N/A;   HEEL SPUR SURGERY Left    HIP SURGERY Bilateral    to "shave gthe bones"   KNEE ARTHROSCOPY WITH ANTERIOR CRUCIATE LIGAMENT (ACL) REPAIR Left 01/29/2019   Procedure: KNEE ARTHROSCOPY WITH CHONDROPLASTY AND  ANTERIOR CRUCIATE LIGAMENT ([place]) RPAIR WITH [place];  Krohn: Phillip Leitz, MD;  Location: [facility];  Service: Orthopedics;  Laterality: Left;   LAPAROSCOPIC GASTRIC SLEEVE RESECTION N/A 05/31/2013   Procedure: LAPAROSCOPIC GASTRIC SLEEVE RESECTION;  Phillip Frye: Phillip Curry, MD;  Location: [facility];  Service: General;  Laterality: N/A;   LEFT HEART CATH AND CORONARY ANGIOGRAPHY N/A 09/16/2019   Procedure: LEFT HEART CATH AND CORONARY ANGIOGRAPHY;  Phillip Frye: Charolette Forward, MD;  Location: [facility];  Service: Cardiovascular;  Laterality: N/A;  TONSILLECTOMY AND ADENOIDECTOMY N/A 12/13/2020   Procedure: TONSILLECTOMY AND ADENOIDECTOMY;  A[place]onte: Phillip Baptist, MD;  Location: [facility];  Service: ENT;  Laterality: N/A;   Social History:  reports that he has been smoking cigars. He has never used smokeless tobacco. He reports current alcohol use. He reports that he does not use drugs. Family History:  Family History  Problem Relation Age of Onset   Cirrhosis Father    Diabetes Other    Diabetes Other    Emphysema Paternal Grandfather        smoked     HOME MEDICATIONS: Allergies as of 01/25/2021   No Known Allergies      Medication List        Accurate as of January 25, 2021   6:49 AM. If you have any questions, ask your nurse or doctor.          amiodarone 200 MG tablet Commonly known as: PACERONE Take 200 mg by mouth daily.   atorvastatin 10 MG tablet Commonly known as: LIPITOR TAKE 1 TABLET (10 MG TOTAL) BY MOUTH DAILY AT 6 PM.   blood glucose meter kit and supplies Dispense based on patient and insurance preference. Use up to four times daily as directed. (FOR ICD-10 E10.9, E11.9).   Cinnamon 500 MG Tabs Take 1,000 mg by mouth daily.   cyclobenzaprine 10 MG tablet Commonly known as: FLEXERIL Take 1 tablet (10 mg total) by mouth 2 (two) times daily as needed for muscle spasms.   freestyle lancets   FreeStyle Libre 2 Reader Devi 1 Device by Does not apply route as directed.   FreeStyle Libre 2 Sensor Misc 1 Device by Does not apply route every 14 (fourteen) days.   furosemide 40 MG tablet Commonly known as: LASIX Take 40 mg by mouth daily.   glucose blood test strip Up to 3 times per day - DM2 with hyperglycemia. Uncontrolled.   Insulin Pen Needle 32G X 4 MM Misc 1 Device by Does not apply route daily.   Klor-Con 10 10 MEQ tablet Generic drug: potassium chloride Take 10 mEq by mouth daily.   losartan-hydrochlorothiazide 100-25 MG tablet Commonly known as: HYZAAR Take 1 tablet by mouth daily.   metFORMIN 1000 MG tablet Commonly known as: GLUCOPHAGE TAKE 1 TABLET (1,000 MG TOTAL) BY MOUTH 2 (TWO) TIMES DAILY WITH A MEAL.   metoprolol succinate 100 MG 24 hr tablet Commonly known as: TOPROL-XL Take 100 mg by mouth daily. Morning   metoprolol succinate 50 MG 24 hr tablet Commonly known as: TOPROL-XL Take 50 mg by mouth at bedtime.   Ozempic (2 MG/DOSE) 8 MG/3ML Sopn Generic drug: Semaglutide (2 MG/DOSE) Inject 2 mg into the skin once a week.   Skyrizi Pen 150 MG/ML Soaj Generic drug: Risankizumab-rzaa Inject 150 mg into the skin every 3 (three) months.   Toujeo Max SoloStar 300 UNIT/ML Solostar Pen Generic drug: insulin  glargine (2 Unit Dial) Inject 40 Units into the skin daily. What changed: how much to take         OBJECTIVE:   Vital Signs: There were no vitals taken for this visit.  Wt Readings from Last 3 Encounters:  12/13/20 (!) 399 lb (181 kg)  12/11/20 (!) 399 lb 3.2 oz (181.1 kg)  11/28/20 (!) 350 lb (158.8 kg)     Exam: General: Pt appears well and is in NAD  Lungs: Clear with good BS bilat   Heart: RRR  Extremities: Trace edema   Neuro: MS is good  with appropriate affect, pt is alert and Ox3   DM foot exam: 05/17/2019   The skin of the feet is intact without sores or ulcerations. The pedal pulses are 2+ on right and 2+ on left. The sensation is decreased on the left  to a screening 5.07, 10 gram monofilament      DATA REVIEWED:  Lab Results  Component Value Date   HGBA1C 7.8 (H) 10/18/2020   HGBA1C 8.2 (A) 10/02/2020   HGBA1C 10.9 (A) 07/06/2020    Latest Reference Range & Units 12/11/20 11:10  Sodium 135 - 145 mmol/L 137  Potassium 3.5 - 5.1 mmol/L 3.9  Chloride 98 - 111 mmol/L 100  CO2 22 - 32 mmol/L 28  Glucose 70 - 99 mg/dL 113 (H)  BUN 6 - 20 mg/dL 9  Creatinine 0.61 - 1.24 mg/dL 1.23  Calcium 8.9 - 10.3 mg/dL 9.4  Anion gap 5 - 15  9  GFR, Estimated >60 mL/min >60    Latest Reference Range & Units 10/02/20 08:13  Total CHOL/HDL Ratio  4  Cholesterol 0 - 200 mg/dL 108  HDL Cholesterol >39.00 mg/dL 28.30 (L)  LDL (calc) 0 - 99 mg/dL 58  MICROALB/CREAT RATIO 0.0 - 30.0 mg/g 0.3  NonHDL  79.71  Triglycerides 0.0 - 149.0 mg/dL 111.0  VLDL 0.0 - 40.0 mg/dL 22.2  (L): Data is abnormally low   24- hr urinary cortisol pending    ASSESSMENT / PLAN / RECOMMENDATIONS:   1) Type 2 Diabetes Mellitus,Poorly controlled, Without complications - Most recent A1c of 8.2   %. Goal A1c < 7.0 %.      - A1c trending down from 10.9 %  - He was not clear on how much insulin he has been taking, initially stated 30 units then 38 units ?   - Freestyle libre sent  -  Will make the following changes   MEDICATIONS:  -Increase  Ozempic to 2 mg weekly -Continue  Metformin 1000  Twice a day with meals - Increase Toujeo to 40  units daily   EDUCATION / INSTRUCTIONS: BG monitoring instructions: Patient is instructed to check his blood sugars 1 times a day, fasting [facility] if: BG persistently < 70  I reviewed the Rule of 15 for the treatment of hypoglycemia in detail with the patient. Literature supplied.  2) Diabetic complications:  Eye: Does not have known diabetic retinopathy.  Neuro/ Feet: Does not have known diabetic peripheral neuropathy. Renal: Patient does not have known baseline CKD. He is on an ACEI/ARB at present.  3) HTN:   - This is out of control. He was recently seen by cardiology and medications adjusted. He is supposed to be on Metoprolol QAM and QPM but he admits to forgetting the evening dose.   - I am going to screen him for cushing syndrome and hyperaldo.    4) Dyslipidemia :   - LDL at goal as well as Tg  - NO change    Medication  Continue Atorvastatin 10 mg daily   F/U in 4 months    Signed electronically by: Mack Guise, MD  [facility]  [address] [place], [place] [address] Phone: [phone] FAX: [phone]   CC: Janie Morning, [address] [place] [address] Phone: [phone]  Fax: [phone]  Return to Endocrinology clinic as below: Future Appointments  Date Time Provider [place]  01/25/2021  7:30 AM Brinleigh Tew, Melanie Crazier, MD  [facility] None  03/08/2021  8:30 AM Ward Givens, NP [facility] None

## 2021-01-29 ENCOUNTER — Emergency Department (HOSPITAL_BASED_OUTPATIENT_CLINIC_OR_DEPARTMENT_OTHER): Payer: BC Managed Care – PPO | Admitting: Radiology

## 2021-01-29 ENCOUNTER — Emergency Department (HOSPITAL_BASED_OUTPATIENT_CLINIC_OR_DEPARTMENT_OTHER)
Admission: EM | Admit: 2021-01-29 | Discharge: 2021-01-29 | Disposition: A | Discharge status: Home / Self Care | Payer: BC Managed Care – PPO | Attending: Emergency Medicine | Admitting: Emergency Medicine

## 2021-01-29 ENCOUNTER — Other Ambulatory Visit: Payer: Self-pay

## 2021-01-29 ENCOUNTER — Encounter (HOSPITAL_BASED_OUTPATIENT_CLINIC_OR_DEPARTMENT_OTHER): Payer: Self-pay | Admitting: Emergency Medicine

## 2021-01-29 DIAGNOSIS — R6883 Chills (without fever): Secondary | ICD-10-CM | POA: Diagnosis not present

## 2021-01-29 DIAGNOSIS — Z7984 Long term (current) use of oral hypoglycemic drugs: Secondary | ICD-10-CM | POA: Diagnosis not present

## 2021-01-29 DIAGNOSIS — Z794 Long term (current) use of insulin: Secondary | ICD-10-CM | POA: Insufficient documentation

## 2021-01-29 DIAGNOSIS — Z79899 Other long term (current) drug therapy: Secondary | ICD-10-CM | POA: Diagnosis not present

## 2021-01-29 DIAGNOSIS — E119 Type 2 diabetes mellitus without complications: Secondary | ICD-10-CM | POA: Insufficient documentation

## 2021-01-29 DIAGNOSIS — M7918 Myalgia, other site: Secondary | ICD-10-CM | POA: Diagnosis not present

## 2021-01-29 DIAGNOSIS — I10 Essential (primary) hypertension: Secondary | ICD-10-CM | POA: Diagnosis not present

## 2021-01-29 DIAGNOSIS — U071 COVID-19: Secondary | ICD-10-CM | POA: Insufficient documentation

## 2021-01-29 DIAGNOSIS — F17290 Nicotine dependence, other tobacco product, uncomplicated: Secondary | ICD-10-CM | POA: Insufficient documentation

## 2021-01-29 DIAGNOSIS — R059 Cough, unspecified: Secondary | ICD-10-CM | POA: Diagnosis not present

## 2021-01-29 LAB — RESP PANEL BY RT-PCR (FLU A&B, COVID) ARPGX2
Influenza A by PCR: NEGATIVE
Influenza B by PCR: NEGATIVE
SARS Coronavirus 2 by RT PCR: POSITIVE — AB

## 2021-01-29 MED ORDER — BENZONATATE 100 MG PO CAPS: 100.0000 mg | ORAL_CAPSULE | Freq: Three times a day (TID) | ORAL | 0 refills | Status: DC

## 2021-01-29 NOTE — ED Notes (Signed)
Patient verbalizes understanding of discharge instructions. Opportunity for questioning and answers were provided. Patient discharged from ED.  °

## 2021-01-29 NOTE — ED Provider Notes (Signed)
[facility] EMERGENCY DEPT Provider Note   CSN: [id number] Arrival date & time: 01/29/21  4818     History Chief Complaint  Patient presents with   Generalized Body Aches    Phillip Frye is a 50 y.o. male.   URI Presenting symptoms: congestion, cough, fever (subjective) and rhinorrhea   Patient states his symptoms started a couple weeks ago.  He occasionally brings up some mucus when he coughs.  His chest is now sore from coughing.  He has not measured a fever but has felt like he has had fevers intermittently.  He also has had some episodes of vomiting and diarrhea.  He has vomited 3 times has had 3 loose stools over the last several days.  He is not having any abdominal pain.  He did take a home COVID test and it was negative.  He has tried several over-the-counter medications but still feels congested.  He started to feel somewhat short of breath and was worried he might be developing a pneumonia so he came in for evaluation.  He has not seen anyone prior to today    Past Medical History:  Diagnosis Date   Arthritis    Atrial fibrillation ([place])    Diabetes mellitus without complication ([place])    Dysrhythmia    unknown type   GERD (gastroesophageal reflux disease)    Hypertension    Obesity    Pulmonary embolism, bilateral ([place]) 09/2012   on xarelto   Shortness of breath 05/21/2013   with exertion   Sleep apnea    uses c pap     Patient Active Problem List   Diagnosis Date Noted   S/P T&A (status post tonsillectomy and adenoidectomy) 12/13/2020   Atrial fibrillation ([place]) 01/26/2020   Calcaneal spur 01/26/2020   Type 2 diabetes mellitus without complication, without long-term current use of insulin ([place]) 02/10/2019   Left anterior cruciate ligament tear 01/29/2019   Acute medial meniscus tear of left knee 01/29/2019   Acute lateral meniscus tear of left knee 01/29/2019   Chondromalacia of left knee 01/29/2019   Type 2 diabetes mellitus with  hyperglycemia, with long-term current use of insulin ([place]) 10/28/2018   Dyslipidemia 10/28/2018   Primary hypercoagulable state ([place]) 01/18/2018   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   Abnormal EKG 12/23/2012   Obesity, Class III, BMI 40-49.9 (morbid obesity) ([place]) 12/11/2012   SOB (shortness of breath) 09/22/2012   Pulmonary embolism, bilateral ([place]) 09/22/2012   Diabetes mellitus without complication ([place])    DENTAL PAIN 03/31/2008   CARPAL TUNNEL SYNDROME, BILATERAL 10/09/2007   ULNAR NEUROPATHY 10/09/2007   DENTAL CARIES 10/09/2007   GERD 10/09/2007   INGUINAL PAIN, BILATERAL 02/26/2007   KNEE PAIN, RIGHT 12/19/2006   FOOT PAIN, BILATERAL 12/19/2006   HYPERTENSION 09/18/2006   AVASCULAR NECROSIS 09/18/2006   Sleep apnea 09/18/2006    Past Surgical History:  Procedure Laterality Date   BREATH TEK H PYLORI N/A 12/28/2012   Procedure: BREATH TEK H PYLORI;  Ring: Gayland Curry, MD;  Location: [facility];  Service: General;  Laterality: N/A;   HEEL SPUR SURGERY Left    HIP SURGERY Bilateral    to "shave gthe bones"   KNEE ARTHROSCOPY WITH ANTERIOR CRUCIATE LIGAMENT (ACL) REPAIR Left 01/29/2019   Procedure: KNEE ARTHROSCOPY WITH CHONDROPLASTY AND  ANTERIOR CRUCIATE LIGAMENT (ACL) RPAIR WITH [place];  [place]: Dorna Leitz, MD;  Location: [facility];  Service: Orthopedics;  Laterality: Left;   LAPAROSCOPIC GASTRIC SLEEVE RESECTION N/A  05/31/2013   Procedure: LAPAROSCOPIC GASTRIC SLEEVE RESECTION;  [place]: Gayland Curry, MD;  Location: [facility];  Service: General;  Laterality: N/A;   LEFT HEART CATH AND CORONARY ANGIOGRAPHY N/A 09/16/2019   Procedure: LEFT HEART CATH AND CORONARY ANGIOGRAPHY;  [place]: Charolette Forward, MD;  Location: [facility];  Service: Cardiovascular;  Laterality: N/A;   TONSILLECTOMY AND ADENOIDECTOMY N/A 12/13/2020   Procedure: TONSILLECTOMY AND ADENOIDECTOMY;  Mcneely: Leta Baptist, MD;  [facility];  Service: ENT;   Laterality: N/A;       Family History  Problem Relation Age of Onset   Cirrhosis Father    Diabetes Other    Diabetes Other    Emphysema Paternal Grandfather        smoked    Social History   Tobacco Use   Smoking status: Every Day    Types: Cigars   Smokeless tobacco: Never   Tobacco comments:    3 cigars a day  Vaping Use   Vaping Use: Never used  Substance Use Topics   Alcohol use: Yes    Comment: occasional   Drug use: No    Home Medications Prior to Admission medications   Medication Sig Start Date End Date Taking? Authorizing Provider  benzonatate (TESSALON) 100 MG capsule Take 1 capsule (100 mg total) by mouth every 8 (eight) hours. 01/29/21  Yes Dorie Rank, MD  amiodarone (PACERONE) 200 MG tablet Take 200 mg by mouth daily. 05/12/19   [provider]  atorvastatin (LIPITOR) 10 MG tablet TAKE 1 TABLET (10 MG TOTAL) BY MOUTH DAILY AT 6 PM. Patient not taking: No sig reported 04/19/19   Wendie Agreste, MD  blood glucose meter kit and supplies Dispense based on patient and insurance preference. Use up to four times daily as directed. (FOR ICD-10 E10.9, E11.9). 01/15/18   Wendie Agreste, MD  Cinnamon 500 MG TABS Take 1,000 mg by mouth daily.    [provider]  Continuous Blood Gluc Receiver (FREESTYLE LIBRE 2 READER) DEVI 1 Device by Does not apply route as directed. 10/02/20   Shamleffer, Melanie Crazier, MD  Continuous Blood Gluc Sensor (FREESTYLE LIBRE 2 SENSOR) MISC 1 Device by Does not apply route every 14 (fourteen) days. 10/02/20   Shamleffer, Melanie Crazier, MD  cyclobenzaprine (FLEXERIL) 10 MG tablet Take 1 tablet (10 mg total) by mouth 2 (two) times daily as needed for muscle spasms. 11/28/20   Curatolo, Adam, DO  furosemide (LASIX) 40 MG tablet Take 40 mg by mouth daily. 11/01/20   [provider]  glucose blood test strip Up to 3 times per day - DM2 with hyperglycemia. Uncontrolled. 10/22/18   Wendie Agreste, MD  insulin  glargine, 2 Unit Dial, (TOUJEO MAX SOLOSTAR) 300 UNIT/ML Solostar Pen Inject 40 Units into the skin daily. Patient taking differently: Inject 30 Units into the skin daily. 10/02/20   Shamleffer, Melanie Crazier, MD  Insulin Pen Needle 32G X 4 MM MISC 1 Device by Does not apply route daily. 07/06/20   Shamleffer, Melanie Crazier, MD  KLOR-CON 10 10 MEQ tablet Take 10 mEq by mouth daily. 05/27/19   [provider]  Lancets (FREESTYLE) lancets  01/15/18   [provider]  losartan-hydrochlorothiazide (HYZAAR) 100-25 MG tablet Take 1 tablet by mouth daily. 10/31/20   [provider]  metFORMIN (GLUCOPHAGE) 1000 MG tablet TAKE 1 TABLET (1,000 MG TOTAL) BY MOUTH 2 (TWO) TIMES DAILY WITH A MEAL. 05/24/20   Shamleffer, Melanie Crazier, MD  metoprolol succinate (TOPROL-XL) 100 MG 24 hr tablet Take 100 mg by mouth daily. Morning 05/14/19   [provider]  metoprolol succinate (TOPROL-XL) 50 MG 24 hr tablet Take 50 mg by mouth at bedtime. 06/05/20   [provider]  Semaglutide, 2 MG/DOSE, (OZEMPIC, 2 MG/DOSE,) 8 MG/3ML SOPN Inject 2 mg into the skin once a week. 10/02/20   Shamleffer, Melanie Crazier, MD  SKYRIZI PEN 150 MG/ML SOAJ Inject 150 mg into the skin every 3 (three) months. 11/24/19   [provider]  amLODipine (NORVASC) 5 MG tablet Take 1 tablet (5 mg total) by mouth daily. 01/01/18 12/23/18  Wendie Agreste, MD  levocetirizine (XYZAL) 5 MG tablet Take 1 tablet (5 mg total) by mouth every evening. 08/23/17 01/26/19  Kennyth Arnold, FNP    Allergies    Patient has no known allergies.  Review of Systems   Review of Systems  Constitutional:  Positive for fever (subjective).  HENT:  Positive for congestion and rhinorrhea.   Respiratory:  Positive for cough.   All other systems reviewed and are negative.  Physical Exam Updated Vital Signs BP (!) 123/96 (BP Location: Right Arm)    Pulse (!) 58    Temp 98.3 F (36.8 C) (Oral)    Resp 13    Ht 1.88  m (_0 )    Wt (!) 163.3 kg    SpO2 99%    BMI 46.22 kg/m   Physical Exam Vitals and nursing note reviewed.  Constitutional:      General: He is not in acute distress.    Appearance: He is well-developed.     Comments: Increased BMI  HENT:     Head: Normocephalic and atraumatic.     Right Ear: External ear normal.     Left Ear: External ear normal.     Nose: Congestion present.  Eyes:     General: No scleral icterus.       Right eye: No discharge.        Left eye: No discharge.     Conjunctiva/sclera: Conjunctivae normal.  Neck:     Trachea: No tracheal deviation.  Cardiovascular:     Rate and Rhythm: Normal rate and regular rhythm.  Pulmonary:     Effort: Pulmonary effort is normal. No respiratory distress.     Breath sounds: Normal breath sounds. No stridor. No wheezing or rales.  Abdominal:     General: Bowel sounds are normal. There is no distension.     Palpations: Abdomen is soft.     Tenderness: There is no abdominal tenderness. There is no guarding or rebound.  Musculoskeletal:        General: No tenderness or deformity.     Cervical back: Neck supple.  Skin:    General: Skin is warm and dry.     Findings: No rash.  Neurological:     General: No focal deficit present.     Mental Status: He is alert.     Cranial Nerves: No cranial nerve deficit (no facial droop, extraocular movements intact, no slurred speech).     Sensory: No sensory deficit.     Motor: No abnormal muscle tone or seizure activity.     Coordination: Coordination normal.  Psychiatric:        Mood and Affect: Mood normal.    ED Results / Procedures / Treatments   Labs (all labs ordered are listed, but only abnormal results are displayed) Labs Reviewed  RESP PANEL BY RT-PCR (  FLU A&B, COVID) ARPGX2 - Abnormal; Notable for the following components:      Result Value   SARS Coronavirus 2 by RT PCR POSITIVE (*)    All other components within normal limits    EKG EKG  Interpretation  Date/Time:  Monday January 29 2021 08:34:46 EST Ventricular Rate:  58 PR Interval:  202 QRS Duration: 105 QT Interval:  501 QTC Calculation: 493 R Axis:   36 Text Interpretation: Sinus rhythm Borderline prolonged PR interval Probable left atrial enlargement Probable anteroseptal infarct, old Abnormal T, consider ischemia, diffuse leads No significant change since last tracing Confirmed by Dorie Rank [phone]) on 01/29/2021 8:37:18 AM  Radiology DG Chest 2 View  Result Date: 01/29/2021 CLINICAL DATA:  Worse of breath, body aches, chills and cough. EXAM: CHEST - 2 VIEW COMPARISON:  07/05/2020 FINDINGS: Stable top-normal/mildly enlarged heart. There is no evidence of pulmonary edema, consolidation, pneumothorax, nodule or pleural fluid. The visualized skeletal structures are unremarkable. IMPRESSION: No active cardiopulmonary disease. Electronically Signed   By: Aletta Edouard M.D.   On: 01/29/2021 09:08    Procedures Procedures   Medications Ordered in ED Medications - No data to display  ED Course  I have reviewed the triage vital signs and the nursing notes.  Pertinent labs & imaging results that were available during my care of the patient were reviewed by me and considered in my medical decision making (see chart for details).    MDM Rules/Calculators/A&P                         Patient presents with URI type symptoms.  Patient was concerned about developing pneumonia.  Chest x-ray is normal.  No signs of pneumonia.  His COVID test is positive however.  This certainly would account for his symptoms.  Patient is now 2 weeks since the onset is not a candidate for any antiviral agents.  Fortunately does not have an oxygen requirement.  No findings to suggest severe COVID infection that would require admission to the hospital.    Final Clinical Impression(s) / ED Diagnoses Final diagnoses:  COVID    Rx / DC Orders ED Discharge Orders          Ordered     benzonatate (TESSALON) 100 MG capsule  Every 8 hours        01/29/21 0950             Dorie Rank, MD 01/29/21 [phone]

## 2021-01-29 NOTE — Discharge Instructions (Signed)
Take Tessalon to help with the cough.  You can continue other over-the-counter medications as needed.  Follow-up with your doctor if symptoms or not improving in the next week

## 2021-01-29 NOTE — ED Triage Notes (Signed)
Pt arrives to ED with c/o body aches. This started x2 weeks ago. Associated symptoms include chills, cough, emesis, rhinorrhea, SOB. His symptoms has worsened over the two weeks.

## 2021-02-20 ENCOUNTER — Other Ambulatory Visit: Payer: Self-pay | Admitting: Internal Medicine

## 2021-02-28 ENCOUNTER — Other Ambulatory Visit: Payer: Self-pay | Admitting: Internal Medicine

## 2021-03-07 ENCOUNTER — Ambulatory Visit: Payer: 59 | Admitting: Adult Health

## 2021-03-08 ENCOUNTER — Encounter: Payer: Self-pay | Admitting: Adult Health

## 2021-03-08 ENCOUNTER — Ambulatory Visit: Payer: 59 | Admitting: Adult Health

## 2021-03-21 DIAGNOSIS — F439 Reaction to severe stress, unspecified: Secondary | ICD-10-CM | POA: Diagnosis not present

## 2021-03-21 DIAGNOSIS — Z566 Other physical and mental strain related to work: Secondary | ICD-10-CM | POA: Diagnosis not present

## 2021-03-26 ENCOUNTER — Telehealth: Payer: Self-pay

## 2021-03-26 ENCOUNTER — Other Ambulatory Visit (HOSPITAL_COMMUNITY): Payer: Self-pay

## 2021-03-26 NOTE — Telephone Encounter (Signed)
Patient Advocate Encounter   Received notification from [facility] that prior authorization for Ozempic 8mg /29ml pen injectors is required by his/her insurance [employer].   PA submitted on 03/26/21  Key#: [id number]  Status is pending    [facility] will continue to follow:  Patient Advocate Fax: [phone]

## 2021-03-27 ENCOUNTER — Other Ambulatory Visit (HOSPITAL_COMMUNITY): Payer: Self-pay

## 2021-03-27 NOTE — Telephone Encounter (Signed)
Patient Advocate Encounter  Prior Authorization for Ozempic 8mg /60ml pen injectors has been approved.    PA# 1m  Effective dates: 03/26/21 through 03/26/22  Per Test Claim Patients co-pay is $0.   Spoke with Pharmacy to Process.  Patient Advocate Fax: [phone]

## 2021-04-10 DIAGNOSIS — I480 Paroxysmal atrial fibrillation: Secondary | ICD-10-CM | POA: Diagnosis not present

## 2021-04-10 DIAGNOSIS — E1169 Type 2 diabetes mellitus with other specified complication: Secondary | ICD-10-CM | POA: Diagnosis not present

## 2021-04-10 DIAGNOSIS — I10 Essential (primary) hypertension: Secondary | ICD-10-CM | POA: Diagnosis not present

## 2021-04-10 DIAGNOSIS — G4733 Obstructive sleep apnea (adult) (pediatric): Secondary | ICD-10-CM | POA: Diagnosis not present

## 2021-04-16 DIAGNOSIS — G4700 Insomnia, unspecified: Secondary | ICD-10-CM | POA: Diagnosis not present

## 2021-04-16 DIAGNOSIS — Z566 Other physical and mental strain related to work: Secondary | ICD-10-CM | POA: Diagnosis not present

## 2021-04-16 DIAGNOSIS — F439 Reaction to severe stress, unspecified: Secondary | ICD-10-CM | POA: Diagnosis not present

## 2021-04-17 DIAGNOSIS — G4700 Insomnia, unspecified: Secondary | ICD-10-CM | POA: Diagnosis not present

## 2021-04-17 DIAGNOSIS — F32A Depression, unspecified: Secondary | ICD-10-CM | POA: Diagnosis not present

## 2021-04-17 DIAGNOSIS — F419 Anxiety disorder, unspecified: Secondary | ICD-10-CM | POA: Diagnosis not present

## 2021-04-29 IMAGING — CR CHEST - 2 VIEW
2 series · 2 of 2 positions shown · non-contrast
Comparison: Chest radiograph 11/30/2017 and earlier.

CLINICAL DATA: 47-year-old male with left side chest pain since
last night, shortness of breath. Smoker.

EXAM:
CHEST - 2 VIEW

[chest pa]
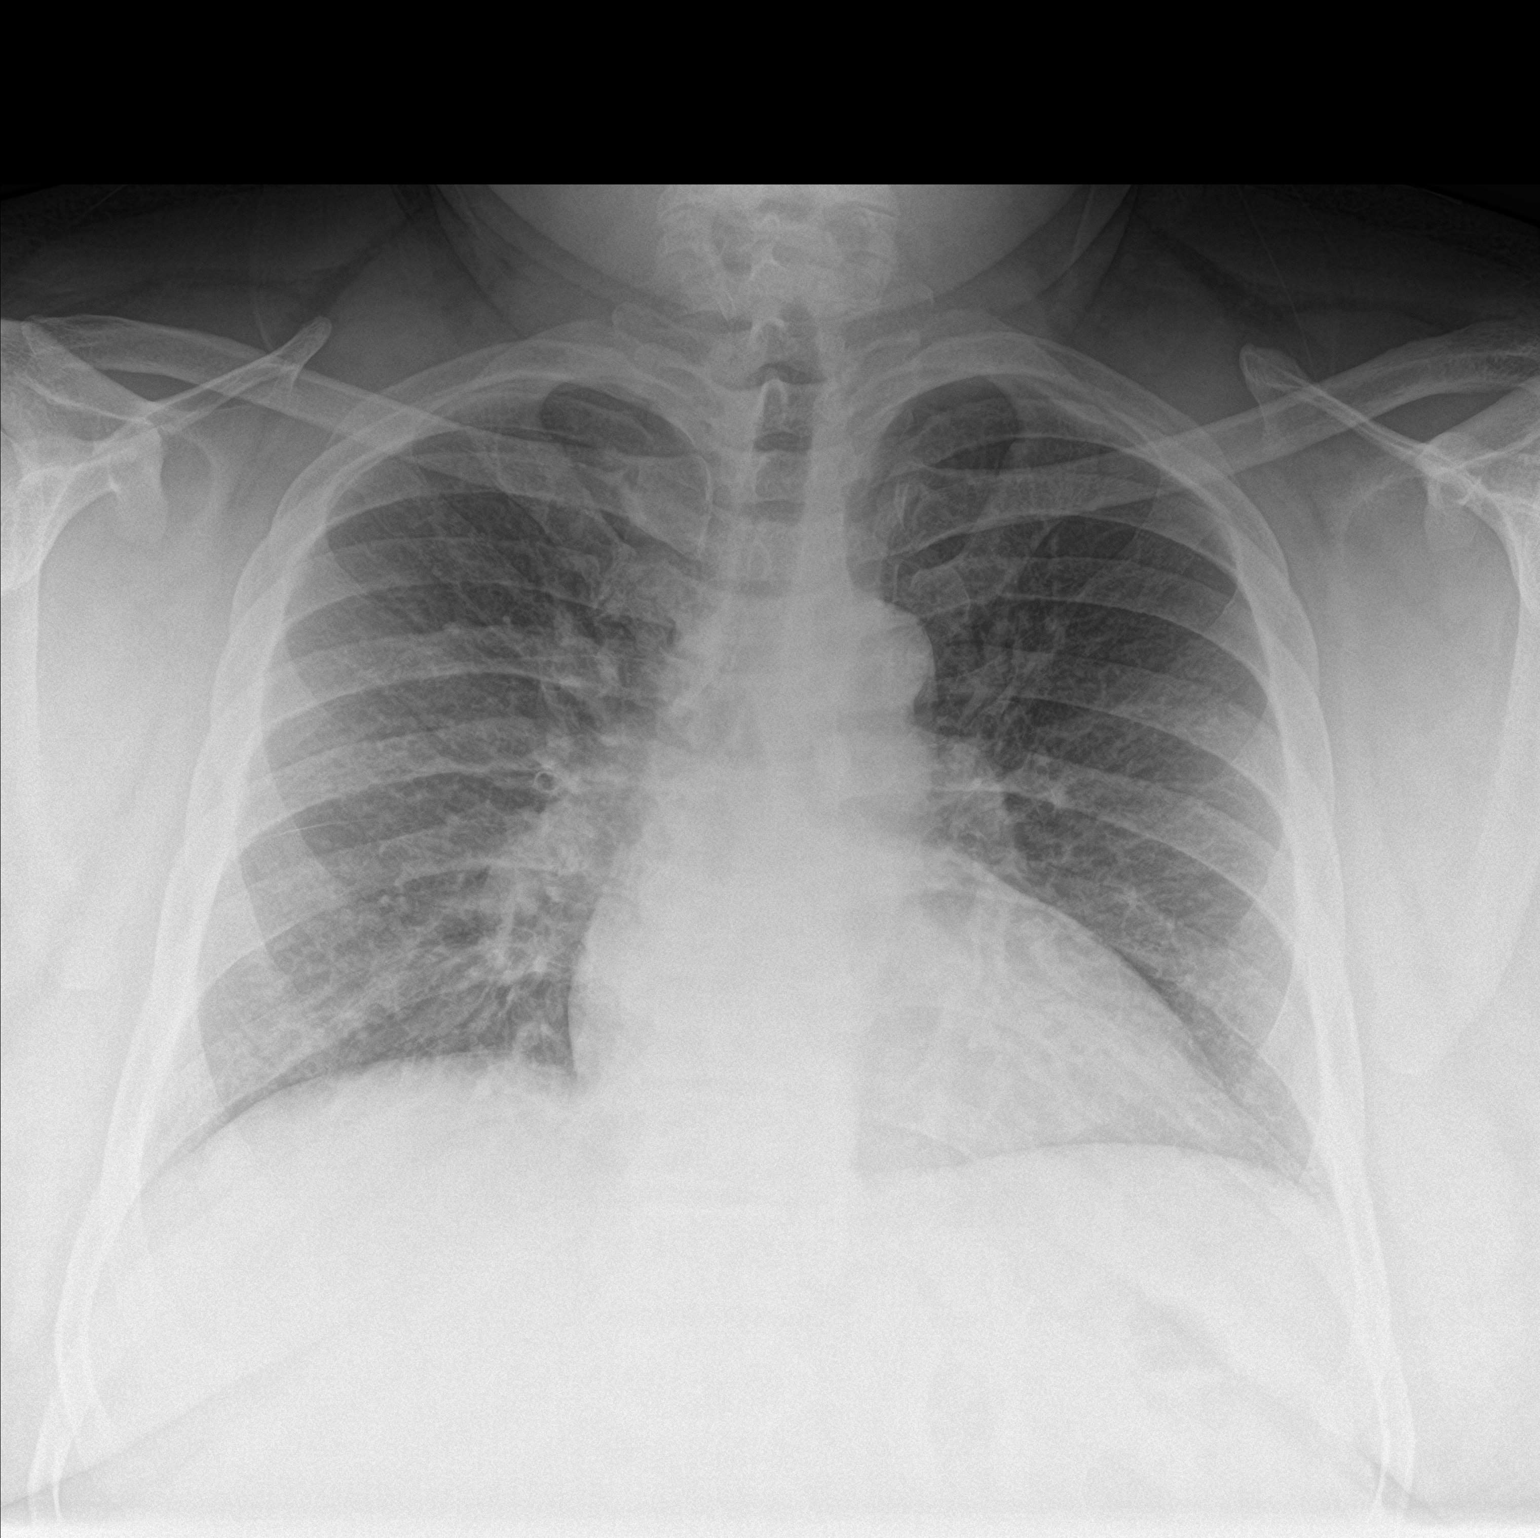

[chest lat]
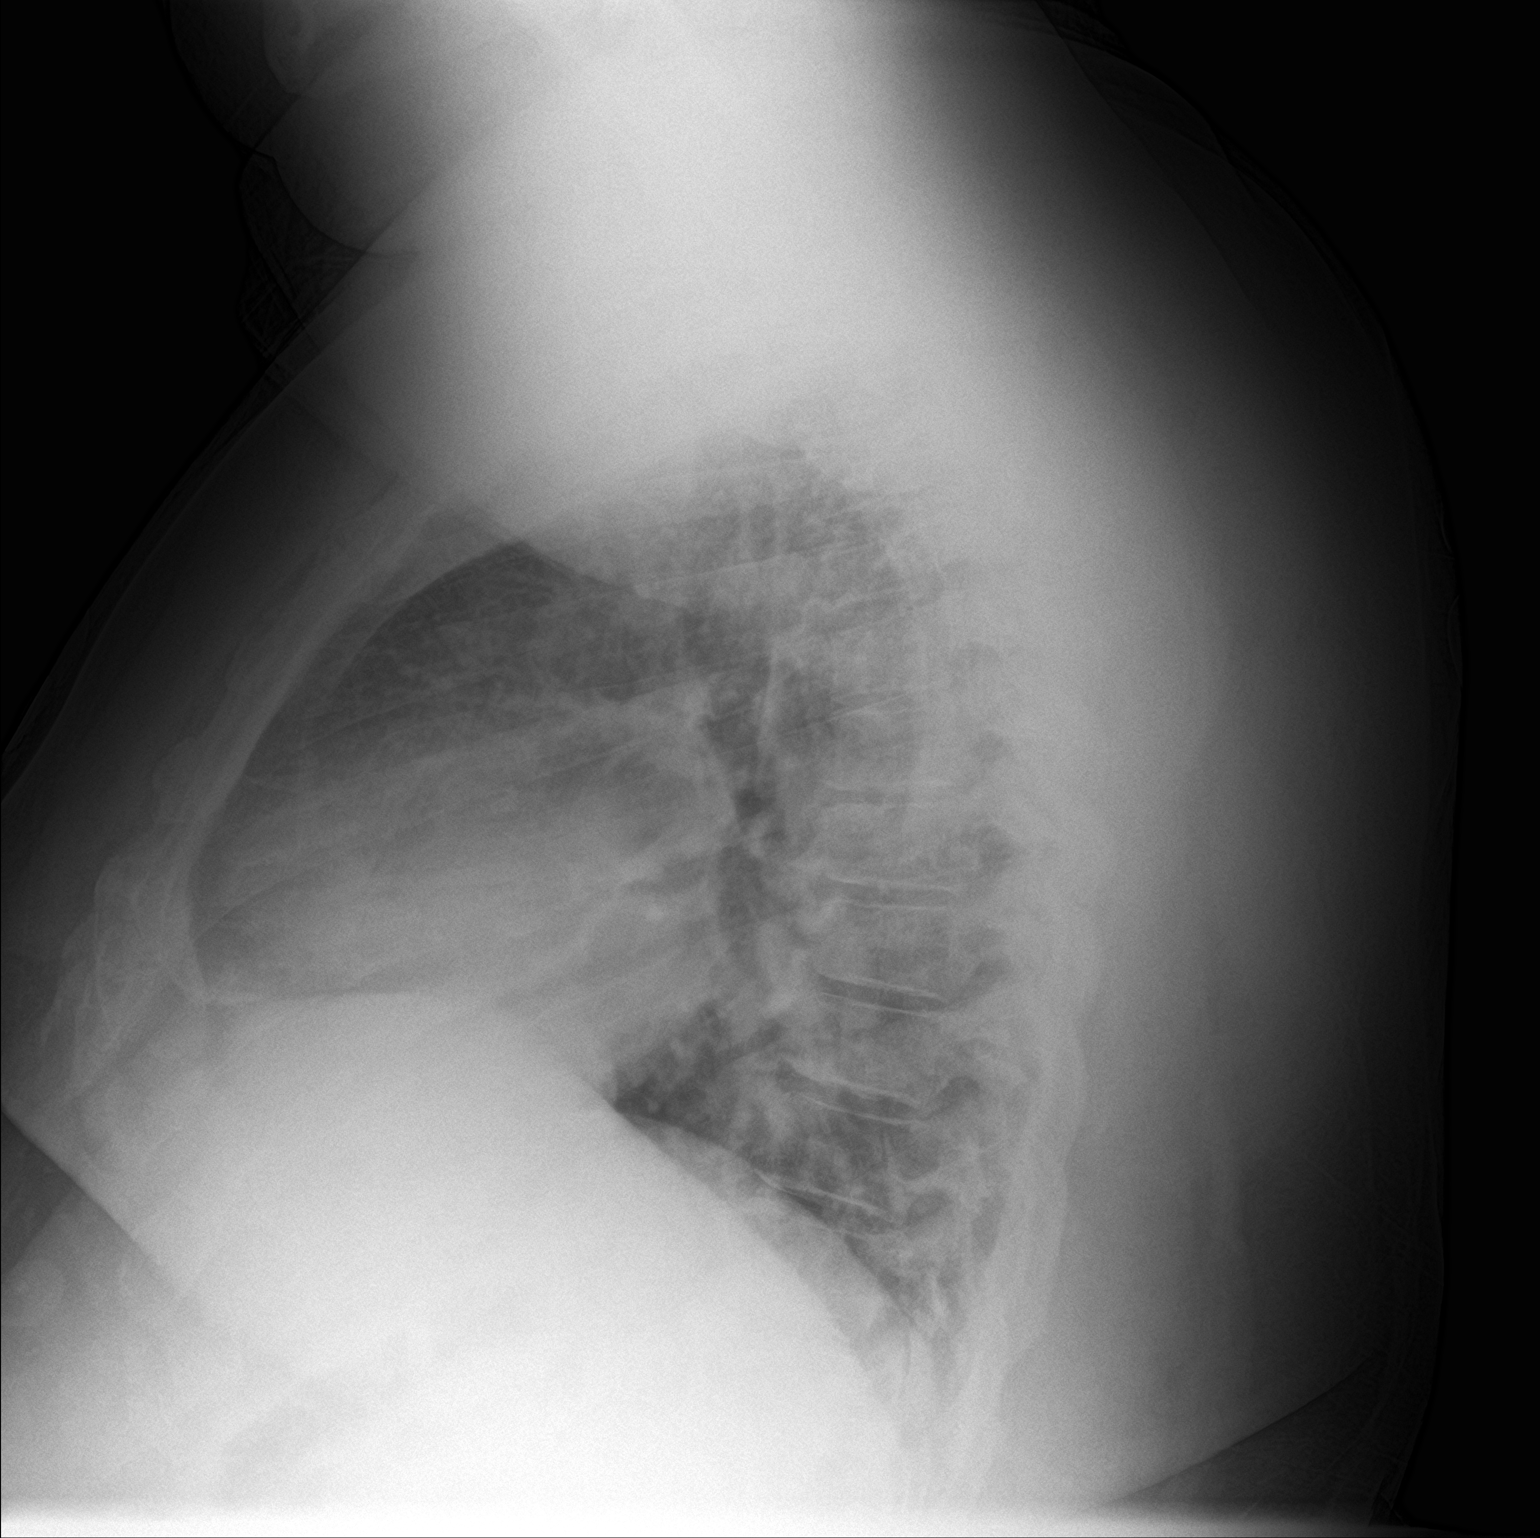

[2 of 2 positions shown; findings below may reference images not displayed]

FINDINGS: Widespread bilateral increased pulmonary interstitial markings
appear chronic and stable since 8107. Cardiac and mediastinal
contours remain within normal limits. Visualized tracheal air column
is within normal limits. No pneumothorax, pleural effusion or acute
pulmonary opacity. No acute osseous abnormality identified. Negative
visible bowel gas pattern.
IMPRESSION: No acute cardiopulmonary abnormality. Chronic pulmonary interstitial
changes.

## 2021-05-02 DIAGNOSIS — F411 Generalized anxiety disorder: Secondary | ICD-10-CM | POA: Diagnosis not present

## 2021-05-02 DIAGNOSIS — Z79891 Long term (current) use of opiate analgesic: Secondary | ICD-10-CM | POA: Diagnosis not present

## 2021-05-03 DIAGNOSIS — F411 Generalized anxiety disorder: Secondary | ICD-10-CM | POA: Diagnosis not present

## 2021-05-17 DIAGNOSIS — F411 Generalized anxiety disorder: Secondary | ICD-10-CM | POA: Diagnosis not present

## 2021-08-29 ENCOUNTER — Telehealth: Payer: Self-pay

## 2021-08-29 MED ORDER — TRULICITY 4.5 MG/0.5ML ~~LOC~~ SOAJ: 4.5000 mg | SUBCUTANEOUS | 3 refills | Status: DC

## 2021-08-29 NOTE — Telephone Encounter (Signed)
Patient notified

## 2021-08-29 NOTE — Telephone Encounter (Signed)
[facility] sent request for drug change on Ozempic. Alternatives are Bydureon, and Truilicity . Send new script if appropriate.

## 2021-09-24 ENCOUNTER — Ambulatory Visit: Payer: BC Managed Care – PPO | Admitting: Internal Medicine

## 2021-09-24 NOTE — Progress Notes (Deleted)
Name: Phillip Frye  Age/ Sex: 51 y.o., male   MRN/ DOB: [id number], April 16, 1970     PCP: Janie Morning, DO   Reason for Endocrinology Evaluation: Type 2 Diabetes Mellitus  Initial Endocrine Consultative Visit: 10/28/2018    PATIENT IDENTIFIER: Phillip Frye is a 51 y.o. male with a past medical history of HTN, T2DM,A. Fib , OSA and PE. The patient has followed with Endocrinology clinic since 10/28/2018 for consultative assistance with management of his diabetes.     DIABETIC HISTORY:  Phillip Frye was diagnosed with DM in 2019. Has been on victoza and Januvia .Hemoglobin A1c has ranged from  10.5% in 2020, peaking at 12.8% in 2019.   ON his initial visit to our clinic his A1c was 10.5%. He was on Basaglar and Metformin. Ozempic was added. Basaglar was discontinued 11/2018 due to hypoglycemia.   Insulin restarted 06/2020 due to hyperglycemia and an A1c 10.8%   Works a 3rd shift 8 PM to 6 AM   [employer]    Eats 3 pm, 10 pm and 1 AM    Pt had normal cardiac cath 09/16/2019 SUBJECTIVE:   During the last visit (10/02/2020):  A1c was 8.2% . We increased  Ozempic and continued  metformin and basal insulin      Today (09/24/2021): Mr. [name] is here for a follow up on diabetes and recent ED visit for severe hyperglycemia with a serum glucose 574 mg/dL on 07/05/2020.  Pending tonsillectomy 10/25/2020   Saw cardiology - changed BP meds due to hypertension - does not recall changes at this time   Denies nausea or vomiting Had one episode off diarrhea     HOME DIABETES REGIMEN:  Ozempic 2 mg weekly  Metformin 1000 mg BID  Toujeo 40 units daily      METER DOWNLOAD SUMMARY:Did not bring      DIABETIC COMPLICATIONS: Microvascular complications:    Denies: CKD, neuropathy , retinopathy  Last eye exam: Completed 2020   Macrovascular complications:    Denies: CAD, PVD, CVA      HISTORY:  Past Medical History:  Past Medical History:   Diagnosis Date   Arthritis    Atrial fibrillation ([place])    Diabetes mellitus without complication ([place])    Dysrhythmia    unknown type   GERD (gastroesophageal reflux disease)    Hypertension    Obesity    Pulmonary embolism, bilateral ([place]) 09/2012   on xarelto   Shortness of breath 05/21/2013   with exertion   Sleep apnea    uses c pap    Past Surgical History:  Past Surgical History:  Procedure Laterality Date   BREATH TEK H PYLORI N/A 12/28/2012   Procedure: BREATH TEK H PYLORI;  [place]: Phillip Curry, MD;  Location: [facility];  Service: General;  Laterality: N/A;   HEEL SPUR SURGERY Left    HIP SURGERY Bilateral    to "shave gthe bones"   KNEE ARTHROSCOPY WITH ANTERIOR CRUCIATE LIGAMENT (ACL) REPAIR Left 01/29/2019   Procedure: KNEE ARTHROSCOPY WITH CHONDROPLASTY AND  ANTERIOR CRUCIATE LIGAMENT ([place]) RPAIR WITH [place];  [place]: Phillip Leitz, MD;  Location: [facility];  Service: Orthopedics;  Laterality: Left;   LAPAROSCOPIC GASTRIC SLEEVE RESECTION N/A 05/31/2013   Procedure: LAPAROSCOPIC GASTRIC SLEEVE RESECTION;  [place]: Phillip Curry, MD;  [facility];  Service: General;  Laterality: N/A;   LEFT HEART CATH AND CORONARY ANGIOGRAPHY N/A 09/16/2019   Procedure: LEFT HEART CATH AND  CORONARY ANGIOGRAPHY;  [place]: Phillip Forward, MD;  Location: [facility];  Service: Cardiovascular;  Laterality: N/A;   TONSILLECTOMY AND ADENOIDECTOMY N/A 12/13/2020   Procedure: TONSILLECTOMY AND ADENOIDECTOMY;  [place]: Phillip Baptist, MD;  [facility];  Service: ENT;  Laterality: N/A;   Social History:  reports that he has been smoking cigars. He has never used smokeless tobacco. He reports current alcohol use. He reports that he does not use drugs. Family History:  Family History  Problem Relation Age of Onset   Cirrhosis Father    Diabetes Other    Diabetes Other    Emphysema Paternal Grandfather        smoked     HOME MEDICATIONS: Allergies as of  09/24/2021   No Known Allergies      Medication List        Accurate as of September 24, 2021  7:32 AM. If you have any questions, ask your nurse or doctor.          amiodarone 200 MG tablet Commonly known as: PACERONE Take 200 mg by mouth daily.   atorvastatin 10 MG tablet Commonly known as: LIPITOR TAKE 1 TABLET (10 MG TOTAL) BY MOUTH DAILY AT 6 PM.   benzonatate 100 MG capsule Commonly known as: TESSALON Take 1 capsule (100 mg total) by mouth every 8 (eight) hours.   blood glucose meter kit and supplies Dispense based on patient and insurance preference. Use up to four times daily as directed. (FOR ICD-10 E10.9, E11.9).   Cinnamon 500 MG Tabs Take 1,000 mg by mouth daily.   cyclobenzaprine 10 MG tablet Commonly known as: FLEXERIL Take 1 tablet (10 mg total) by mouth 2 (two) times daily as needed for muscle spasms.   freestyle lancets   FreeStyle Libre 2 Reader Devi 1 Device by Does not apply route as directed.   FreeStyle Libre 2 Sensor Misc 1 Device by Does not apply route every 14 (fourteen) days.   furosemide 40 MG tablet Commonly known as: LASIX Take 40 mg by mouth daily.   glucose blood test strip Up to 3 times per day - DM2 with hyperglycemia. Uncontrolled.   Insulin Pen Needle 32G X 4 MM Misc 1 Device by Does not apply route daily.   Klor-Con 10 10 MEQ tablet Generic drug: potassium chloride Take 10 mEq by mouth daily.   losartan-hydrochlorothiazide 100-25 MG tablet Commonly known as: HYZAAR Take 1 tablet by mouth daily.   metFORMIN 1000 MG tablet Commonly known as: GLUCOPHAGE TAKE 1 TABLET BY MOUTH TWICE A DAY WITH MEALS   metoprolol succinate 100 MG 24 hr tablet Commonly known as: TOPROL-XL Take 100 mg by mouth daily. Morning   metoprolol succinate 50 MG 24 hr tablet Commonly known as: TOPROL-XL Take 50 mg by mouth at bedtime.   Skyrizi Pen 150 MG/ML Soaj Generic drug: Risankizumab-rzaa Inject 150 mg into the skin every 3 (three)  months.   Toujeo Max SoloStar 300 UNIT/ML Solostar Pen Generic drug: insulin glargine (2 Unit Dial) Inject 40 Units into the skin daily. What changed: how much to take   Trulicity 4.5 OF/7.5ZW Sopn Generic drug: Dulaglutide Inject 4.5 mg into the skin once a week.         OBJECTIVE:   Vital Signs: There were no vitals taken for this visit.  Wt Readings from Last 3 Encounters:  01/29/21 (!) 360 lb (163.3 kg)  12/13/20 (!) 399 lb (181 kg)  12/11/20 (!) 399 lb 3.2 oz (181.1 kg)  Exam: General: Pt appears well and is in NAD  Lungs: Clear with good BS bilat   Heart: RRR  Extremities: Trace edema   Neuro: MS is good with appropriate affect, pt is alert and Ox3   DM foot exam: 05/17/2019   The skin of the feet is intact without sores or ulcerations. The pedal pulses are 2+ on right and 2+ on left. The sensation is decreased on the left  to a screening 5.07, 10 gram monofilament      DATA REVIEWED:  Lab Results  Component Value Date   HGBA1C 7.8 (H) 10/18/2020   HGBA1C 8.2 (A) 10/02/2020   HGBA1C 10.9 (A) 07/06/2020   Results for ALIK, MAWSON (MRN [id number]) as of 10/03/2020 13:52  Ref. Range 10/02/2020 08:13 10/02/2020 08:30  Sodium Latest Ref Range: 135 - 145 mEq/L 139   Potassium Latest Ref Range: 3.5 - 5.1 mEq/L 4.0   Chloride Latest Ref Range: 96 - 112 mEq/L 102   CO2 Latest Ref Range: 19 - 32 mEq/L 29   Glucose Latest Ref Range: 70 - 99 mg/dL 101 (H)   BUN Latest Ref Range: 6 - 23 mg/dL 17   Creatinine Latest Ref Range: 0.40 - 1.50 mg/dL 1.26   Calcium Latest Ref Range: 8.4 - 10.5 mg/dL 9.5   GFR Latest Ref Range: >60.00 mL/min 65.85   Total CHOL/HDL Ratio Unknown 4   Cholesterol Latest Ref Range: 0 - 200 mg/dL 108   HDL Cholesterol Latest Ref Range: >39.00 mg/dL 28.30 (L)   LDL (calc) Latest Ref Range: 0 - 99 mg/dL 58   MICROALB/CREAT RATIO Latest Ref Range: 0.0 - 30.0 mg/g 0.3   NonHDL Unknown 79.71   Triglycerides Latest Ref Range: 0.0 - 149.0  mg/dL 111.0   VLDL Latest Ref Range: 0.0 - 40.0 mg/dL 22.2   ALDOSTERONE Unknown WILL FOLLOW   Renin Unknown WILL FOLLOW   ALDOS/RENIN RATIO Unknown WILL FOLLOW   Hemoglobin A1C Latest Ref Range: 4.0 - 5.6 %  8.2 (A)  TSH Latest Ref Range: 0.450 - 4.500 uIU/mL 0.935   T4,Free(Direct) Unknown WILL FOLLOW   Creatinine,U Latest Units: mg/dL 378.5   Microalb, Ur Latest Ref Range: 0.0 - 1.9 mg/dL 1.2     24- hr urinary cortisol pending    ASSESSMENT / PLAN / RECOMMENDATIONS:   1) Type 2 Diabetes Mellitus,Poorly controlled, Without complications - Most recent A1c of 8.2   %. Goal A1c < 7.0 %.      - A1c trending down from 10.9 %  - He was not clear on how much insulin he has been taking, initially stated 30 units then 38 units ?   - Freestyle libre sent  - Will make the following changes   MEDICATIONS:  -Increase  Ozempic to 2 mg weekly -Continue  Metformin 1000  Twice a day with meals - Increase Toujeo to 40  units daily   EDUCATION / INSTRUCTIONS: BG monitoring instructions: Patient is instructed to check his blood sugars 1 times a day, fasting Call [facility] if: BG persistently < 70  I reviewed the Rule of 15 for the treatment of hypoglycemia in detail with the patient. Literature supplied.  2) Diabetic complications:  Eye: Does not have known diabetic retinopathy.  Neuro/ Feet: Does not have known diabetic peripheral neuropathy. Renal: Patient does not have known baseline CKD. He is on an ACEI/ARB at present.  3) HTN:   - This is out of control. He was recently seen by  cardiology and medications adjusted. He is supposed to be on Metoprolol QAM and QPM but he admits to forgetting the evening dose.   - I am going to screen him for cushing syndrome and hyperaldo.    4) Dyslipidemia :   - LDL at goal as well as Tg  - NO change    Medication  Continue Atorvastatin 10 mg daily   F/U in 4 months    Signed electronically by: Mack Guise, MD  [facility]  [facility] [address] [place], [place] [address] Phone: [phone] FAX: [phone]   CC: Janie Morning, [address] [place] [address] Phone: [phone]  Fax: [phone]  Return to Endocrinology clinic as below: Future Appointments  Date Time Provider [place]  09/24/2021  9:50 AM Annabelle Rexroad, Melanie Crazier, MD [id number] None

## 2021-11-19 ENCOUNTER — Other Ambulatory Visit (HOSPITAL_COMMUNITY): Payer: Self-pay

## 2021-11-29 ENCOUNTER — Other Ambulatory Visit (HOSPITAL_COMMUNITY): Payer: Self-pay

## 2021-11-30 ENCOUNTER — Telehealth: Payer: Self-pay

## 2021-11-30 ENCOUNTER — Encounter: Payer: Self-pay | Admitting: Internal Medicine

## 2021-11-30 ENCOUNTER — Ambulatory Visit (INDEPENDENT_AMBULATORY_CARE_PROVIDER_SITE_OTHER): Payer: Self-pay | Admitting: Internal Medicine

## 2021-11-30 ENCOUNTER — Other Ambulatory Visit (HOSPITAL_COMMUNITY): Payer: Self-pay

## 2021-11-30 VITALS — BP 130/70 | HR 63 | Ht 74.0 in | Wt 362.0 lb

## 2021-11-30 DIAGNOSIS — E119 Type 2 diabetes mellitus without complications: Secondary | ICD-10-CM

## 2021-11-30 DIAGNOSIS — Z794 Long term (current) use of insulin: Secondary | ICD-10-CM

## 2021-11-30 LAB — POCT GLYCOSYLATED HEMOGLOBIN (HGB A1C): Hemoglobin A1C: 7 % — AB (ref 4.0–5.6)

## 2021-11-30 LAB — POCT GLUCOSE (DEVICE FOR HOME USE): POC Glucose: 115 mg/dl — AB (ref 70–99)

## 2021-11-30 MED ORDER — TIRZEPATIDE 5 MG/0.5ML ~~LOC~~ SOAJ: 5.0000 mg | SUBCUTANEOUS | 2 refills | Status: DC

## 2021-11-30 MED ORDER — TOUJEO MAX SOLOSTAR 300 UNIT/ML ~~LOC~~ SOPN: 40.0000 [IU] | PEN_INJECTOR | Freq: Every day | SUBCUTANEOUS | 6 refills | Status: DC

## 2021-11-30 MED ORDER — INSULIN PEN NEEDLE 31G X 8 MM MISC: 1.0000 | Freq: Every day | 3 refills | Status: DC

## 2021-11-30 NOTE — Telephone Encounter (Signed)
Received notification regarding a prior authorization for Mounjaro 5mg /0.79ml.  Authorization has been submitted and is pending with [employer].  Called [phone] Claim # [id number]

## 2021-11-30 NOTE — Patient Instructions (Signed)
-   Start Mounjaro 5 mg once weekly  - Decrease Toujeo to 36  units daily     HOW TO TREAT LOW BLOOD SUGARS (Blood sugar LESS THAN 70 MG/DL) Please follow the RULE OF 15 for the treatment of hypoglycemia treatment (when your (blood sugars are less than 70 mg/dL)   STEP 1: Take 15 grams of carbohydrates when your blood sugar is low, which includes:  3-4 GLUCOSE TABS  OR 3-4 OZ OF JUICE OR REGULAR SODA OR ONE TUBE OF GLUCOSE GEL    STEP 2: RECHECK blood sugar in 15 MINUTES STEP 3: If your blood sugar is still low at the 15 minute recheck --> then, go back to STEP 1 and treat AGAIN with another 15 grams of carbohydrates.    24-Hour Urine Collection  You will be collecting your urine for a 24-hour period of time. Your timer starts with your first urine of the morning (For example - If you first pee at [place], your timer will start at [place]) [place] away your first urine of the morning Collect your urine every time you pee for the next 24 hours STOP your urine collection 24 hours after you started the collection (For example - You would stop at 9AM the day after you started)

## 2021-11-30 NOTE — Progress Notes (Signed)
Name: Phillip Frye  Age/ Sex: 51 y.o., male   MRN/ DOB: [id number], [birth year]     PCP: Irena Reichmann, DO   Reason for Endocrinology Evaluation: Type 2 Diabetes Mellitus  Initial Endocrine Consultative Visit: 10/28/2018    PATIENT IDENTIFIER: [name] is a 51 y.o. male with a past medical history of HTN, T2DM,A. Fib , OSA and PE. The patient has followed with Endocrinology clinic since 10/28/2018 for consultative assistance with management of his diabetes.     DIABETIC HISTORY:  [name] was diagnosed with DM in 2019. Has been on victoza and Januvia .Hemoglobin A1c has ranged from  10.5% in 2020, peaking at 12.8% in 2019.   ON his initial visit to our clinic his A1c was 10.5%. He was on Basaglar and Metformin. Ozempic was added. Basaglar was discontinued 11/2018 due to hypoglycemia.   Insulin restarted 06/2020 due to hyperglycemia and an A1c 10.8%   Works a 3rd shift 8 PM to 6 AM   [employer]    Eats 3 pm, 10 pm and 1 AM    Pt had normal cardiac cath 09/16/2019  Stopped Metformin as he is not comfortable taking it due to hearing negative reviews  SUBJECTIVE:   During the last visit (09/2020):  A1c was 8.2% .     Today (11/30/2021): [name] is here for a follow up on diabetes management. He has NOT been to our clinic in 14 months.   S/P tonsillectomy 12/2020 Has been out of ozempic 4 months  He stopped Metformin because he is scared of it   Currently working with autistic individuals  HOME DIABETES REGIMEN:  Ozempic 2 mg weekly  Metformin 1000 mg BID  Toujeo 40 units daily    METER DOWNLOAD SUMMARY:Did not bring      DIABETIC COMPLICATIONS: Microvascular complications:    Denies: CKD, neuropathy , retinopathy  Last eye exam: Completed 2020   Macrovascular complications:    Denies: CAD, PVD, CVA      HISTORY:  Past Medical History:  Past Medical History:  Diagnosis Date   Arthritis    Atrial fibrillation  (HCC)    Diabetes mellitus without complication (HCC)    Dysrhythmia    unknown type   GERD (gastroesophageal reflux disease)    Hypertension    Obesity    Pulmonary embolism, bilateral (HCC) 09/2012   on xarelto   Shortness of breath 05/21/2013   with exertion   Sleep apnea    uses c pap    Past Surgical History:  Past Surgical History:  Procedure Laterality Date   BREATH TEK H PYLORI N/A 12/28/2012   Procedure: BREATH TEK H PYLORI;  Ribera: Atilano Ina, MD;  Location: [facility];  Service: General;  Laterality: N/A;   HEEL SPUR SURGERY Left    HIP SURGERY Bilateral    to "shave gthe bones"   KNEE ARTHROSCOPY WITH ANTERIOR CRUCIATE LIGAMENT (ACL) REPAIR Left 01/29/2019   Procedure: KNEE ARTHROSCOPY WITH CHONDROPLASTY AND  ANTERIOR CRUCIATE LIGAMENT (ACL) RPAIR WITH GRAFTLINK;  Mangham: Jodi Geralds, MD;  Location: [facility];  Service: Orthopedics;  Laterality: Left;   LAPAROSCOPIC GASTRIC SLEEVE RESECTION N/A 05/31/2013   Procedure: LAPAROSCOPIC GASTRIC SLEEVE RESECTION;  Arcand: Atilano Ina, MD;  Location: [facility];  Service: General;  Laterality: N/A;   LEFT HEART CATH AND CORONARY ANGIOGRAPHY N/A 09/16/2019   Procedure: LEFT HEART CATH AND CORONARY ANGIOGRAPHY;  Odriscoll: Rinaldo Cloud, MD;  Location: [facility]  INVASIVE CV LAB;  Service: Cardiovascular;  Laterality: N/A;   TONSILLECTOMY AND ADENOIDECTOMY N/A 12/13/2020   Procedure: TONSILLECTOMY AND ADENOIDECTOMY;  Mcauliffe: Newman Pies, MD;  [facility]: [facility];  Service: ENT;  Laterality: N/A;   Social History:  reports that he has been smoking cigars. He has never used smokeless tobacco. He reports current alcohol use. He reports that he does not use drugs. Family History:  Family History  Problem Relation Age of Onset   Cirrhosis Father    Diabetes Other    Diabetes Other    Emphysema Paternal Grandfather        smoked     HOME MEDICATIONS: Allergies as of 11/30/2021   No Known Allergies      Medication  List        Accurate as of November 30, 2021 12:53 PM. If you have any questions, ask your nurse or doctor.          STOP taking these medications    benzonatate 100 MG capsule Commonly known as: TESSALON Stopped by: Scarlette Shorts, MD       TAKE these medications    amiodarone 200 MG tablet Commonly known as: PACERONE Take 200 mg by mouth daily.   atorvastatin 10 MG tablet Commonly known as: LIPITOR TAKE 1 TABLET (10 MG TOTAL) BY MOUTH DAILY AT 6 PM.   blood glucose meter kit and supplies Dispense based on patient and insurance preference. Use up to four times daily as directed. (FOR ICD-10 E10.9, E11.9).   Cinnamon 500 MG Tabs Take 1,000 mg by mouth daily.   cyclobenzaprine 10 MG tablet Commonly known as: FLEXERIL Take 1 tablet (10 mg total) by mouth 2 (two) times daily as needed for muscle spasms.   freestyle lancets   FreeStyle Libre 2 Reader Devi 1 Device by Does not apply route as directed.   FreeStyle Libre 2 Sensor Misc 1 Device by Does not apply route every 14 (fourteen) days.   furosemide 40 MG tablet Commonly known as: LASIX Take 40 mg by mouth daily.   glucose blood test strip Up to 3 times per day - DM2 with hyperglycemia. Uncontrolled.   Insulin Pen Needle 32G X 4 MM Misc 1 Device by Does not apply route daily.   Klor-Con 10 10 MEQ tablet Generic drug: potassium chloride Take 10 mEq by mouth daily.   losartan-hydrochlorothiazide 100-25 MG tablet Commonly known as: HYZAAR Take 1 tablet by mouth daily.   metFORMIN 1000 MG tablet Commonly known as: GLUCOPHAGE TAKE 1 TABLET BY MOUTH TWICE A DAY WITH MEALS   metoprolol succinate 100 MG 24 hr tablet Commonly known as: TOPROL-XL Take 100 mg by mouth daily. Morning   metoprolol succinate 50 MG 24 hr tablet Commonly known as: TOPROL-XL Take 50 mg by mouth at bedtime.   Skyrizi Pen 150 MG/ML Soaj Generic drug: Risankizumab-rzaa Inject 150 mg into the skin every 3 (three)  months.   Toujeo Max SoloStar 300 UNIT/ML Solostar Pen Generic drug: insulin glargine (2 Unit Dial) Inject 40 Units into the skin daily. What changed: how much to take   Trulicity 4.5 MG/0.5ML Sopn Generic drug: Dulaglutide Inject 4.5 mg into the skin once a week.   Ubrelvy 100 MG Tabs Generic drug: Ubrogepant Take by mouth as needed.         OBJECTIVE:   Vital Signs: BP 130/70 (BP Location: Left Arm, Patient Position: Sitting, Cuff Size: Large)   Pulse 63   Ht 6\' 2"  (1.88 m)  Wt (!) 362 lb (164.2 kg)   SpO2 98%   BMI 46.48 kg/m   Wt Readings from Last 3 Encounters:  11/30/21 (!) 362 lb (164.2 kg)  01/29/21 (!) 360 lb (163.3 kg)  12/13/20 (!) 399 lb (181 kg)     Exam: General: Pt appears well and is in NAD  Lungs: Clear with good BS bilat   Heart: RRR  Extremities: Trace edema   Neuro: MS is good with appropriate affect, pt is alert and Ox3   DM foot exam: 11/30/2021   The skin of the feet is intact without sores or ulcerations. The pedal pulses are 2+ on right and 2+ on left. The sensation is intact on the left  to a screening 5.07, 10 gram monofilament      DATA REVIEWED:  Lab Results  Component Value Date   HGBA1C 7.0 (A) 11/30/2021   HGBA1C 7.8 (H) 10/18/2020   HGBA1C 8.2 (A) 10/02/2020   Results for VON, QUINTANAR (MRN [id number]) as of 10/03/2020 13:52  Ref. Range 10/02/2020 08:13 10/02/2020 08:30  Sodium Latest Ref Range: 135 - 145 mEq/L 139   Potassium Latest Ref Range: 3.5 - 5.1 mEq/L 4.0   Chloride Latest Ref Range: 96 - 112 mEq/L 102   CO2 Latest Ref Range: 19 - 32 mEq/L 29   Glucose Latest Ref Range: 70 - 99 mg/dL 101 (H)   BUN Latest Ref Range: 6 - 23 mg/dL 17   Creatinine Latest Ref Range: 0.40 - 1.50 mg/dL 1.26   Calcium Latest Ref Range: 8.4 - 10.5 mg/dL 9.5   GFR Latest Ref Range: >60.00 mL/min 65.85   Total CHOL/HDL Ratio Unknown 4   Cholesterol Latest Ref Range: 0 - 200 mg/dL 108   HDL Cholesterol Latest Ref Range: >39.00  mg/dL 28.30 (L)   LDL (calc) Latest Ref Range: 0 - 99 mg/dL 58   MICROALB/CREAT RATIO Latest Ref Range: 0.0 - 30.0 mg/g 0.3   NonHDL Unknown 79.71   Triglycerides Latest Ref Range: 0.0 - 149.0 mg/dL 111.0   VLDL Latest Ref Range: 0.0 - 40.0 mg/dL 22.2   ALDOSTERONE Unknown WILL FOLLOW   Renin Unknown WILL FOLLOW   ALDOS/RENIN RATIO Unknown WILL FOLLOW   Hemoglobin A1C Latest Ref Range: 4.0 - 5.6 %  8.2 (A)  TSH Latest Ref Range: 0.450 - 4.500 uIU/mL 0.935   T4,Free(Direct) Unknown WILL FOLLOW   Creatinine,U Latest Units: mg/dL 378.5   Microalb, Ur Latest Ref Range: 0.0 - 1.9 mg/dL 1.2     24- hr urinary cortisol pending    ASSESSMENT / PLAN / RECOMMENDATIONS:   1) Type 2 Diabetes Mellitus,Optimally  controlled, Without complications - Most recent A1c of 7.0 %. Goal A1c < 7.0 %.     -I have praised the patient on optimizing glucose control, he has been without Ozempic for approximately 4 months due to change in insurance -I have praised the patient on weight loss -He also has been taking Toujeo intermittently, his fasting this morning was over 200 mg/DL, he took 40 units of Toujeo with an in office BG of 115 mg/DL -I have encouraged the patient of taking Toujeo on a regular basis that way we can gradually reduce the dose and assess the need for insulin -We discussed restarting him on Ozempic versus trying Mounjaro, we have opted to try [facility] and if is not covered by his insurance then we will go with the [place].  He was provided with a coupon -He would like to  stop the metformin because he heard negative things about it, I have tried to reassure him but he was like to continue to stay off    MEDICATIONS:  -Stop metformin -Start Mounjaro 5 mg weekly -Decrease Toujeo to 36  units daily   EDUCATION / INSTRUCTIONS: BG monitoring instructions: Patient is instructed to check his blood sugars 1 times a day, fasting Call [facility] if: BG persistently < 70  I  reviewed the Rule of 15 for the treatment of hypoglycemia in detail with the patient. Literature supplied.  2) Diabetic complications:  Eye: Does not have known diabetic retinopathy.  Neuro/ Feet: Does not have known diabetic peripheral neuropathy. Renal: Patient does not have known baseline CKD. He is on an ACEI/ARB at present.  F/U in 4 months    Signed electronically by: Mack Guise, MD  [facility] Endocrinology  [facility] [address] [place],  [address] Phone: [phone] FAX: [phone]   CC: [address], [address] [place] [address] Phone: [phone]  Fax: [phone]  Return to Endocrinology clinic as below: Future Appointments  Date Time Provider [place]  11/30/2021  1:00 PM Jennifier Smitherman, Melanie Crazier, MD [facility] None

## 2021-11-30 NOTE — Telephone Encounter (Signed)
Patient states that a PA is coming over on [place].

## 2021-12-03 ENCOUNTER — Other Ambulatory Visit (HOSPITAL_COMMUNITY): Payer: Self-pay

## 2021-12-05 ENCOUNTER — Other Ambulatory Visit (HOSPITAL_COMMUNITY): Payer: Self-pay

## 2021-12-05 NOTE — Telephone Encounter (Signed)
Called [employer] [facility] for update on Mounjaro- per automated system PA is awaiting review for completeness of information

## 2021-12-05 NOTE — Telephone Encounter (Signed)
Do we have a update on the PA?

## 2021-12-05 NOTE — Telephone Encounter (Signed)
What does that mean?

## 2021-12-06 ENCOUNTER — Other Ambulatory Visit (HOSPITAL_COMMUNITY): Payer: Self-pay

## 2021-12-06 NOTE — Telephone Encounter (Signed)
Called for follow up. Was given a different phone # [phone] for Phillip Frye, who handles this one. Documentation of diagnosis was needed. Sending office notes and labs. Case ID # [id number] - Faxing to [phone]

## 2021-12-06 NOTE — Telephone Encounter (Signed)
Patient advised and will wait on PA

## 2021-12-06 NOTE — Telephone Encounter (Signed)
Patient states that pharmacy are not able to get the [facility] and would like to switch to Ozempic. He states there are several pharmacies that have it.

## 2021-12-07 ENCOUNTER — Other Ambulatory Visit (HOSPITAL_COMMUNITY): Payer: Self-pay

## 2021-12-17 ENCOUNTER — Other Ambulatory Visit (HOSPITAL_COMMUNITY): Payer: Self-pay

## 2021-12-17 NOTE — Telephone Encounter (Signed)
Called for follow up at [phone].  Spoke with Mable Fill and she stated the prior auth was cancelled due to the addition info not being sent in. I sent in a new prior auth to fax number [phone].

## 2021-12-19 ENCOUNTER — Other Ambulatory Visit: Payer: Self-pay | Admitting: Internal Medicine

## 2021-12-19 ENCOUNTER — Telehealth: Payer: Self-pay

## 2021-12-19 ENCOUNTER — Other Ambulatory Visit (HOSPITAL_COMMUNITY): Payer: Self-pay

## 2021-12-19 ENCOUNTER — Encounter: Payer: Self-pay | Admitting: Internal Medicine

## 2021-12-19 MED ORDER — SEMAGLUTIDE (2 MG/DOSE) 8 MG/3ML ~~LOC~~ SOPN: 2.0000 mg | PEN_INJECTOR | SUBCUTANEOUS | 3 refills | Status: DC

## 2021-12-19 NOTE — Telephone Encounter (Signed)
Called to follow up on the Prior Auth at [phone] and spoke with Ariel. She let me know the Greggory Keen was denied. The denial letter is attached to the chart.

## 2021-12-19 NOTE — Telephone Encounter (Signed)
Received a fax regarding Prior Authorization from [place] for [facility] 27m/0.5ml.   Authorization has been DENIED because there is no indication that the patient has met both of the following: a) individual will continue maximally tolerated metformin therapy, if no contraindicated, intolerant or otherwise not a candidate and b) documentation of one of the following: unable to achieve a goal HbA1C despite metformin or metformin-containing regimen at greater than or equal to 1,5067mper day, intolerance to metformin, contraindication to metformin per FDA label, not a candidate for metformin.  Phone number: [phone] number: [id number] letter attached to chart

## 2021-12-19 NOTE — Telephone Encounter (Signed)
Do we have a update on PA.

## 2021-12-20 MED ORDER — TRULICITY 4.5 MG/0.5ML ~~LOC~~ SOAJ: 4.5000 mg | SUBCUTANEOUS | 3 refills | Status: DC

## 2021-12-25 ENCOUNTER — Ambulatory Visit (INDEPENDENT_AMBULATORY_CARE_PROVIDER_SITE_OTHER): Payer: 59 | Admitting: Podiatry

## 2021-12-25 ENCOUNTER — Ambulatory Visit (INDEPENDENT_AMBULATORY_CARE_PROVIDER_SITE_OTHER): Payer: 59

## 2021-12-25 DIAGNOSIS — M7672 Peroneal tendinitis, left leg: Secondary | ICD-10-CM

## 2021-12-25 DIAGNOSIS — R52 Pain, unspecified: Secondary | ICD-10-CM | POA: Diagnosis not present

## 2021-12-25 MED ORDER — MELOXICAM 15 MG PO TABS: 15.0000 mg | ORAL_TABLET | Freq: Every day | ORAL | 1 refills | Status: DC

## 2021-12-25 MED ORDER — METHYLPREDNISOLONE 4 MG PO TBPK: ORAL_TABLET | ORAL | 0 refills | Status: DC

## 2021-12-25 MED ORDER — BETAMETHASONE SOD PHOS & ACET 6 (3-3) MG/ML IJ SUSP
3.0000 mg | Freq: Once | INTRAMUSCULAR | Status: AC
  Administered 2021-12-25: 3 mg via INTRA_ARTICULAR

## 2021-12-25 NOTE — Progress Notes (Signed)
Chief Complaint  Patient presents with   Foot Pain    Patient is here for left foot bone spur.    HPI: 51 y.o. male presenting today for new complaint of pain and tenderness associated to the lateral aspect of the left foot.  Patient has pain when walking.  This has been ongoing for few months now.  Denies a history of injury.  Gradual onset.  He has not done anything for treatment.  He does have a history of retrocalcaneal exostectomy with repair of Achilles tendon 2021.  Past Medical History:  Diagnosis Date   Arthritis    Atrial fibrillation (HCC)    Diabetes mellitus without complication (HCC)    Dysrhythmia    unknown type   GERD (gastroesophageal reflux disease)    Hypertension    Obesity    Pulmonary embolism, bilateral (HCC) 09/2012   on xarelto   Shortness of breath 05/21/2013   with exertion   Sleep apnea    uses c pap     Past Surgical History:  Procedure Laterality Date   BREATH TEK H PYLORI N/A 12/28/2012   Procedure: BREATH TEK H PYLORI;  Klos: Atilano Ina, MD;  Location: [facility];  Service: General;  Laterality: N/A;   HEEL SPUR SURGERY Left    HIP SURGERY Bilateral    to "shave gthe bones"   KNEE ARTHROSCOPY WITH ANTERIOR CRUCIATE LIGAMENT (ACL) REPAIR Left 01/29/2019   Procedure: KNEE ARTHROSCOPY WITH CHONDROPLASTY AND  ANTERIOR CRUCIATE LIGAMENT (ACL) RPAIR WITH GRAFTLINK;  Dahm: Jodi Geralds, MD;  Location: [facility];  Service: Orthopedics;  Laterality: Left;   LAPAROSCOPIC GASTRIC SLEEVE RESECTION N/A 05/31/2013   Procedure: LAPAROSCOPIC GASTRIC SLEEVE RESECTION;  Ihrig: Atilano Ina, MD;  Location: [facility];  Service: General;  Laterality: N/A;   LEFT HEART CATH AND CORONARY ANGIOGRAPHY N/A 09/16/2019   Procedure: LEFT HEART CATH AND CORONARY ANGIOGRAPHY;  Howington: Rinaldo Cloud, MD;  Location: [facility];  Service: Cardiovascular;  Laterality: N/A;   TONSILLECTOMY AND ADENOIDECTOMY N/A 12/13/2020   Procedure:  TONSILLECTOMY AND ADENOIDECTOMY;  Catala: Newman Pies, MD;  Location: [facility];  Service: ENT;  Laterality: N/A;    No Known Allergies   Physical Exam: General: The patient is alert and oriented x3 in no acute distress.  Dermatology: Skin is warm, dry and supple bilateral lower extremities. Negative for open lesions or macerations.  Vascular: Palpable pedal pulses bilaterally. Capillary refill within normal limits.  Negative for any significant edema or erythema  Neurological: Light touch and protective threshold grossly intact  Musculoskeletal Exam: No pedal deformities noted.  Pain on palpation noted to the fifth metatarsal tubercle at the insertion of the peroneal tendon  Radiographic Exam LT foot 12/25/2021:  Normal osseous mineralization. Joint spaces preserved. No fracture/dislocation/boney destruction.    Assessment: 1.  Insertional peroneal tendinitis left   Plan of Care:  1. Patient evaluated. X-Rays reviewed.  2.  Injection of 0.5 cc Celestone Soluspan injected into the fifth metatarsal tubercle area left 3.  Prescription for Medrol Dosepak 4.  Prescription for meloxicam 15 mg daily after completion of the Dosepak 5 recommend good supportive shoes that do not irritate the lateral aspect of the foot 6.  Return to clinic in 4 weeks      Felecia Shelling, DPM [facility]  Dr. Felecia Shelling, DPM    [address]  [place], [place] [address]                Office [phone]  Fax [phone]

## 2022-01-08 ENCOUNTER — Telehealth: Payer: Self-pay

## 2022-01-08 NOTE — Patient Outreach (Signed)
  Care Coordination   01/08/2022 Name: Phillip Frye MRN: [id number] DOB: 1970-03-18   Care Coordination Outreach Attempts:  An unsuccessful telephone outreach was attempted today to offer the patient information about available care coordination services as a benefit of their health plan.   Follow Up Plan:  Additional outreach attempts will be made to offer the patient care coordination information and services.   Encounter Outcome:  No Answer   Care Coordination Interventions:  No, not indicated    Bary Leriche, RN, MSN [facility] Care Management Care Management Coordinator Direct Line [phone]

## 2022-01-09 ENCOUNTER — Ambulatory Visit: Payer: 59 | Admitting: Podiatry

## 2022-01-22 ENCOUNTER — Telehealth: Payer: Self-pay

## 2022-01-22 NOTE — Patient Outreach (Addendum)
  Care Coordination   Initial Visit Note   01/22/2022 Name: Phillip Frye MRN: [id number] DOB: 01-12-1971  Phillip Frye is a 51 y.o. year old male who sees Irena Reichmann, [place] for primary care. I spoke with  Phillip Frye by phone today. Patient began with care manager but states he will call back.    What matters to the patients health and wellness today?  Not addressed    Goals Addressed   None     SDOH assessments and interventions completed:  No     Care Coordination Interventions:  Yes, provided   Follow up plan:  Patient to call CM back.      Encounter Outcome:  Pt. Request to Call Back   Bary Leriche, RN, MSN [facility] Care Management Care Management Coordinator Direct Line [phone]

## 2022-01-23 ENCOUNTER — Ambulatory Visit (INDEPENDENT_AMBULATORY_CARE_PROVIDER_SITE_OTHER): Payer: 59 | Admitting: Podiatry

## 2022-01-23 ENCOUNTER — Encounter: Payer: Self-pay | Admitting: Podiatry

## 2022-01-23 VITALS — BP 153/91 | HR 111

## 2022-01-23 DIAGNOSIS — M7672 Peroneal tendinitis, left leg: Secondary | ICD-10-CM

## 2022-01-23 NOTE — Progress Notes (Signed)
Chief Complaint  Patient presents with   Foot Pain    Patient that his heel hurts and the medication does not work at this time for pain.    HPI: 51 y.o. male presenting today for follow-up evaluation of pain and tenderness associated to the lateral aspect of the left foot.  Patient has pain when walking.  This has been ongoing for few months now.  Denies a history of injury.  Gradual onset. He does have a history of retrocalcaneal exostectomy with repair of Achilles tendon 2021.  Patient states that the cortisone injection as well as the prednisone pack did not help alleviate any of his symptoms.  He continues to have pain and tenderness.  Past Medical History:  Diagnosis Date   Arthritis    Atrial fibrillation (HCC)    Diabetes mellitus without complication (HCC)    Dysrhythmia    unknown type   GERD (gastroesophageal reflux disease)    Hypertension    Obesity    Pulmonary embolism, bilateral (HCC) 09/2012   on xarelto   Shortness of breath 05/21/2013   with exertion   Sleep apnea    uses c pap     Past Surgical History:  Procedure Laterality Date   BREATH TEK H PYLORI N/A 12/28/2012   Procedure: BREATH TEK H PYLORI;  Darnold: Atilano Ina, MD;  Location: [facility];  Service: General;  Laterality: N/A;   HEEL SPUR SURGERY Left    HIP SURGERY Bilateral    to "shave gthe bones"   KNEE ARTHROSCOPY WITH ANTERIOR CRUCIATE LIGAMENT (ACL) REPAIR Left 01/29/2019   Procedure: KNEE ARTHROSCOPY WITH CHONDROPLASTY AND  ANTERIOR CRUCIATE LIGAMENT (ACL) RPAIR WITH GRAFTLINK;  Bouyer: Jodi Geralds, MD;  Location: [facility];  Service: Orthopedics;  Laterality: Left;   LAPAROSCOPIC GASTRIC SLEEVE RESECTION N/A 05/31/2013   Procedure: LAPAROSCOPIC GASTRIC SLEEVE RESECTION;  Kapuscinski: Atilano Ina, MD;  Location: [facility];  Service: General;  Laterality: N/A;   LEFT HEART CATH AND CORONARY ANGIOGRAPHY N/A 09/16/2019   Procedure: LEFT HEART CATH AND CORONARY ANGIOGRAPHY;   [place]: Rinaldo Cloud, MD;  [facility];  Service: Cardiovascular;  Laterality: N/A;   TONSILLECTOMY AND ADENOIDECTOMY N/A 12/13/2020   Procedure: TONSILLECTOMY AND ADENOIDECTOMY;  Iversen: Newman Pies, MD;  Location: [facility];  Service: ENT;  Laterality: N/A;    No Known Allergies   Physical Exam: General: The patient is alert and oriented x3 in no acute distress.  Dermatology: Skin is warm, dry and supple bilateral lower extremities. Negative for open lesions or macerations.  Vascular: Palpable pedal pulses bilaterally. Capillary refill within normal limits.  Negative for any significant edema or erythema  Neurological: Light touch and protective threshold grossly intact  Musculoskeletal Exam: No pedal deformities noted.  There continues to be pain on palpation noted to the fifth metatarsal tubercle at the insertion of the peroneal tendon  Radiographic Exam LT foot 12/25/2021:  Normal osseous mineralization. Joint spaces preserved. No fracture/dislocation/boney destruction.    Assessment: 1.  Insertional peroneal tendinitis left   Plan of Care:  1. Patient evaluated.  2. CAM boot dispensed. WBAT 3. Continue Meloxicam15mg  daily with food  4.  Return to clinic 3 weeks, if there is no improvement consider MRI       Felecia Shelling, DPM [facility]  Dr. Felecia Shelling, DPM    [address]  [place], [place] [address]                Office [phone]  Fax [phone]

## 2022-02-03 ENCOUNTER — Other Ambulatory Visit: Payer: Self-pay | Admitting: Internal Medicine

## 2022-02-13 ENCOUNTER — Ambulatory Visit (INDEPENDENT_AMBULATORY_CARE_PROVIDER_SITE_OTHER): Payer: BLUE CROSS/BLUE SHIELD | Admitting: Podiatry

## 2022-02-13 DIAGNOSIS — M7672 Peroneal tendinitis, left leg: Secondary | ICD-10-CM | POA: Diagnosis not present

## 2022-02-13 MED ORDER — GABAPENTIN 100 MG PO CAPS: 100.0000 mg | ORAL_CAPSULE | Freq: Three times a day (TID) | ORAL | 1 refills | Status: DC

## 2022-02-13 NOTE — Progress Notes (Signed)
Chief Complaint  Patient presents with   Foot Problem    3wks for Peroneal tendinitis, left.    HPI: 52 y.o. male presenting today for follow-up evaluation of pain and tenderness associated to the lateral aspect of the left foot.  Patient has pain when walking.  This has been ongoing for few months now.  Denies a history of injury.  Gradual onset. He does have a history of retrocalcaneal exostectomy with repair of Achilles tendon 2021.  Patient states that the cortisone injection as well as the prednisone pack did not help alleviate any of his symptoms.  Last visit we placed the patient in a cam boot but he says that the cam boot did not help alleviate any of his symptoms.  He continues to have pain and tenderness.  Past Medical History:  Diagnosis Date   Arthritis    Atrial fibrillation ([place])    Diabetes mellitus without complication ([place])    Dysrhythmia    unknown type   GERD (gastroesophageal reflux disease)    Hypertension    Obesity    Pulmonary embolism, bilateral ([place]) 09/2012   on xarelto   Shortness of breath 05/21/2013   with exertion   Sleep apnea    uses c pap     Past Surgical History:  Procedure Laterality Date   BREATH TEK H PYLORI N/A 12/28/2012   Procedure: BREATH TEK H PYLORI;  Forester: Gayland Curry, MD;  Location: [facility];  Service: General;  Laterality: N/A;   HEEL SPUR SURGERY Left    HIP SURGERY Bilateral    to "shave gthe bones"   KNEE ARTHROSCOPY WITH ANTERIOR CRUCIATE LIGAMENT (ACL) REPAIR Left 01/29/2019   Procedure: KNEE ARTHROSCOPY WITH CHONDROPLASTY AND  ANTERIOR CRUCIATE LIGAMENT (ACL) RPAIR WITH [place];  Twitty: Dorna Leitz, MD;  Location: [facility];  Service: Orthopedics;  Laterality: Left;   LAPAROSCOPIC GASTRIC SLEEVE RESECTION N/A 05/31/2013   Procedure: LAPAROSCOPIC GASTRIC SLEEVE RESECTION;  Tena: Gayland Curry, MD;  Location: [facility];  Service: General;  Laterality: N/A;   LEFT HEART CATH AND CORONARY  ANGIOGRAPHY N/A 09/16/2019   Procedure: LEFT HEART CATH AND CORONARY ANGIOGRAPHY;  Reicks: Charolette Forward, MD;  Location: [facility];  Service: Cardiovascular;  Laterality: N/A;   TONSILLECTOMY AND ADENOIDECTOMY N/A 12/13/2020   Procedure: TONSILLECTOMY AND ADENOIDECTOMY;  Satz: Leta Baptist, MD;  Location: [facility];  Service: ENT;  Laterality: N/A;    No Known Allergies   Physical Exam: General: The patient is alert and oriented x3 in no acute distress.  Dermatology: Skin is warm, dry and supple bilateral lower extremities. Negative for open lesions or macerations.  Vascular: Palpable pedal pulses bilaterally. Capillary refill within normal limits.  Negative for any significant edema or erythema  Neurological: Light touch and protective threshold grossly intact  Musculoskeletal Exam: No pedal deformities noted.  There continues to be pain on palpation noted to the fifth metatarsal tubercle at the insertion of the peroneal tendon  Radiographic Exam LT foot 12/25/2021:  Normal osseous mineralization. Joint spaces preserved. No fracture/dislocation/boney destruction.    Assessment: 1.  Insertional peroneal tendinitis left -Patient may discontinue cam boot.  Patient states that the cam boot did not help -Continue meloxicam 15 mg daily with food -Prescription for gabapentin 100 mg 3 times daily -Return to clinic next scheduled appointment for routine diabetic footcare       Edrick Kins, DPM [facility]  Dr. Edrick Kins, DPM  [address], [place] [address]                Office [phone]  Fax [phone]

## 2022-02-14 ENCOUNTER — Telehealth: Payer: Self-pay

## 2022-02-14 NOTE — Telephone Encounter (Signed)
Patient has new insurance and needs a new pa submitted for the [facility].   [employer]   Member # [id number]   Group # [id number]    [phone]

## 2022-02-18 ENCOUNTER — Telehealth: Payer: Self-pay | Admitting: Pharmacy Technician

## 2022-02-18 ENCOUNTER — Other Ambulatory Visit (HOSPITAL_COMMUNITY): Payer: Self-pay

## 2022-02-18 NOTE — Telephone Encounter (Signed)
Patient calling to follow up on PA for [facility].

## 2022-02-18 NOTE — Telephone Encounter (Signed)
Pharmacy Patient Advocate Encounter   Received notification from [facility] that prior authorization for Mounjaro 5mg  is required/requested. Pt has new ins and would like to try for another PA.   PA submitted on 02/18/22 to (ins) [employer] [place] [employer] via [employer] Status is pending  Ozempic, Rybelsus, Trulicity, Bydureon is preferred.

## 2022-02-22 NOTE — Telephone Encounter (Signed)
Patient Advocate Encounter  Prior Authorization for [employer] 5MG /0.5ML pen-injectors has been approved through [facility]   Effective: 02-18-2022 to [id number]

## 2022-02-22 NOTE — Telephone Encounter (Signed)
Patient notified

## 2022-02-25 ENCOUNTER — Other Ambulatory Visit: Payer: Self-pay

## 2022-02-25 MED ORDER — TIRZEPATIDE 5 MG/0.5ML ~~LOC~~ SOAJ: 5.0000 mg | SUBCUTANEOUS | 1 refills | Status: DC

## 2022-03-14 DEATH — deceased

## 2022-04-25 ENCOUNTER — Other Ambulatory Visit (HOSPITAL_COMMUNITY): Payer: Self-pay

## 2022-05-21 ENCOUNTER — Ambulatory Visit: Payer: Self-pay | Admitting: Podiatry

## 2022-06-03 ENCOUNTER — Ambulatory Visit: Payer: Self-pay | Admitting: Internal Medicine

## 2023-02-11 ENCOUNTER — Other Ambulatory Visit (HOSPITAL_COMMUNITY): Payer: Self-pay

## 2023-03-26 ENCOUNTER — Other Ambulatory Visit (HOSPITAL_COMMUNITY): Payer: Self-pay
